# Patient Record
Sex: Male | Born: 1935
Health system: Southern US, Community
[De-identification: ages and names within clinical notes are randomized; demographics above are authoritative.]

## PROBLEM LIST (undated history)

## (undated) ENCOUNTER — Inpatient Hospital Stay: Admission: EM | Payer: Self-pay | Source: Home / Self Care

## (undated) DIAGNOSIS — L309 Dermatitis, unspecified: Secondary | ICD-10-CM

## (undated) DIAGNOSIS — I714 Abdominal aortic aneurysm, without rupture, unspecified: Secondary | ICD-10-CM

## (undated) DIAGNOSIS — D509 Iron deficiency anemia, unspecified: Secondary | ICD-10-CM

## (undated) DIAGNOSIS — I350 Nonrheumatic aortic (valve) stenosis: Secondary | ICD-10-CM

## (undated) DIAGNOSIS — K219 Gastro-esophageal reflux disease without esophagitis: Secondary | ICD-10-CM

## (undated) DIAGNOSIS — R55 Syncope and collapse: Secondary | ICD-10-CM

## (undated) DIAGNOSIS — I4891 Unspecified atrial fibrillation: Secondary | ICD-10-CM

## (undated) DIAGNOSIS — I739 Peripheral vascular disease, unspecified: Secondary | ICD-10-CM

## (undated) DIAGNOSIS — J8410 Pulmonary fibrosis, unspecified: Secondary | ICD-10-CM

## (undated) DIAGNOSIS — Z8719 Personal history of other diseases of the digestive system: Secondary | ICD-10-CM

## (undated) DIAGNOSIS — R011 Cardiac murmur, unspecified: Secondary | ICD-10-CM

## (undated) DIAGNOSIS — M1990 Unspecified osteoarthritis, unspecified site: Secondary | ICD-10-CM

## (undated) DIAGNOSIS — K909 Intestinal malabsorption, unspecified: Secondary | ICD-10-CM

## (undated) DIAGNOSIS — J849 Interstitial pulmonary disease, unspecified: Secondary | ICD-10-CM

## (undated) DIAGNOSIS — D631 Anemia in chronic kidney disease: Secondary | ICD-10-CM

## (undated) DIAGNOSIS — Z9981 Dependence on supplemental oxygen: Secondary | ICD-10-CM

## (undated) DIAGNOSIS — E785 Hyperlipidemia, unspecified: Secondary | ICD-10-CM

## (undated) HISTORY — PX: CARDIAC VALVE REPLACEMENT: SHX585

## (undated) HISTORY — DX: Iron deficiency anemia, unspecified: D50.9

## (undated) HISTORY — DX: Nonrheumatic aortic (valve) stenosis: I35.0

## (undated) HISTORY — DX: Dermatitis, unspecified: L30.9

## (undated) HISTORY — DX: Intestinal malabsorption, unspecified: K90.9

## (undated) HISTORY — DX: Hyperlipidemia, unspecified: E78.5

## (undated) HISTORY — DX: Interstitial pulmonary disease, unspecified: J84.9

## (undated) HISTORY — DX: Anemia in chronic kidney disease: D63.1

## (undated) HISTORY — PX: INGUINAL HERNIA REPAIR: SUR1180

## (undated) HISTORY — DX: Abdominal aortic aneurysm, without rupture: I71.4

## (undated) HISTORY — DX: Abdominal aortic aneurysm, without rupture, unspecified: I71.40

## (undated) HISTORY — PX: CLEFT PALATE REPAIR: SUR1165

---

## 1987-01-26 HISTORY — PX: OTHER SURGICAL HISTORY: SHX169

## 1997-01-25 HISTORY — PX: LAPAROSCOPIC CHOLECYSTECTOMY: SUR755

## 1998-01-13 ENCOUNTER — Encounter: Payer: Self-pay | Admitting: Emergency Medicine

## 1998-01-14 ENCOUNTER — Inpatient Hospital Stay (HOSPITAL_COMMUNITY): Admission: EM | Admit: 1998-01-14 | Discharge: 1998-01-17 | Payer: Self-pay | Admitting: Emergency Medicine

## 1998-01-14 ENCOUNTER — Encounter: Payer: Self-pay | Admitting: Family Medicine

## 1998-01-16 ENCOUNTER — Encounter: Payer: Self-pay | Admitting: Otolaryngology

## 2000-06-29 ENCOUNTER — Encounter: Payer: Self-pay | Admitting: Family Medicine

## 2000-06-29 ENCOUNTER — Ambulatory Visit (HOSPITAL_COMMUNITY): Admission: RE | Admit: 2000-06-29 | Discharge: 2000-06-29 | Payer: Self-pay | Admitting: Family Medicine

## 2000-09-29 ENCOUNTER — Encounter (INDEPENDENT_AMBULATORY_CARE_PROVIDER_SITE_OTHER): Payer: Self-pay | Admitting: Specialist

## 2000-09-29 ENCOUNTER — Ambulatory Visit (HOSPITAL_COMMUNITY): Admission: RE | Admit: 2000-09-29 | Discharge: 2000-09-29 | Payer: Self-pay | Admitting: *Deleted

## 2002-01-25 HISTORY — PX: CATARACT EXTRACTION W/ INTRAOCULAR LENS  IMPLANT, BILATERAL: SHX1307

## 2002-09-13 ENCOUNTER — Encounter (INDEPENDENT_AMBULATORY_CARE_PROVIDER_SITE_OTHER): Payer: Self-pay | Admitting: *Deleted

## 2002-09-13 ENCOUNTER — Ambulatory Visit (HOSPITAL_COMMUNITY): Admission: RE | Admit: 2002-09-13 | Discharge: 2002-09-13 | Payer: Self-pay | Admitting: *Deleted

## 2005-12-20 ENCOUNTER — Ambulatory Visit: Payer: Self-pay | Admitting: Family Medicine

## 2005-12-20 DIAGNOSIS — R011 Cardiac murmur, unspecified: Secondary | ICD-10-CM

## 2005-12-29 ENCOUNTER — Ambulatory Visit: Payer: Self-pay | Admitting: Cardiology

## 2005-12-29 ENCOUNTER — Encounter: Payer: Self-pay | Admitting: Cardiology

## 2005-12-29 ENCOUNTER — Ambulatory Visit: Admission: RE | Admit: 2005-12-29 | Discharge: 2005-12-29 | Payer: Self-pay | Admitting: Family Medicine

## 2005-12-30 ENCOUNTER — Telehealth: Payer: Self-pay | Admitting: Family Medicine

## 2006-01-03 ENCOUNTER — Encounter: Payer: Self-pay | Admitting: Family Medicine

## 2006-01-04 LAB — CONVERTED CEMR LAB
ALT: 16 units/L (ref 0–53)
CO2: 25 meq/L (ref 19–32)
Calcium: 9.2 mg/dL (ref 8.4–10.5)
Chloride: 102 meq/L (ref 96–112)
Cholesterol: 222 mg/dL — ABNORMAL HIGH (ref 0–200)
Creatinine, Ser: 0.9 mg/dL (ref 0.40–1.50)
Glucose, Bld: 87 mg/dL (ref 70–99)
Sodium: 139 meq/L (ref 135–145)
Total Bilirubin: 0.7 mg/dL (ref 0.3–1.2)
Total Protein: 7.1 g/dL (ref 6.0–8.3)
Triglycerides: 153 mg/dL — ABNORMAL HIGH (ref <150)
VLDL: 31 mg/dL (ref 0–40)

## 2007-01-20 ENCOUNTER — Ambulatory Visit: Payer: Self-pay | Admitting: Family Medicine

## 2007-03-08 ENCOUNTER — Emergency Department (HOSPITAL_COMMUNITY): Admission: EM | Admit: 2007-03-08 | Discharge: 2007-03-08 | Payer: Self-pay | Admitting: Emergency Medicine

## 2007-03-20 ENCOUNTER — Ambulatory Visit: Payer: Self-pay | Admitting: Family Medicine

## 2007-08-07 ENCOUNTER — Ambulatory Visit: Payer: Self-pay | Admitting: Family Medicine

## 2007-08-07 DIAGNOSIS — I359 Nonrheumatic aortic valve disorder, unspecified: Secondary | ICD-10-CM

## 2007-08-08 ENCOUNTER — Encounter: Payer: Self-pay | Admitting: Family Medicine

## 2007-08-08 DIAGNOSIS — E785 Hyperlipidemia, unspecified: Secondary | ICD-10-CM

## 2007-08-09 LAB — CONVERTED CEMR LAB
AST: 35 units/L (ref 0–37)
Albumin: 4.2 g/dL (ref 3.5–5.2)
Alkaline Phosphatase: 63 units/L (ref 39–117)
BUN: 14 mg/dL (ref 6–23)
Creatinine, Ser: 0.86 mg/dL (ref 0.40–1.50)
Glucose, Bld: 105 mg/dL — ABNORMAL HIGH (ref 70–99)
HCT: 44.8 % (ref 39.0–52.0)
HDL: 51 mg/dL (ref >39)
Hemoglobin: 14.8 g/dL (ref 13.0–17.0)
LDL Cholesterol: 161 mg/dL — ABNORMAL HIGH (ref 0–99)
MCHC: 33 g/dL (ref 30.0–36.0)
MCV: 87.8 fL (ref 78.0–100.0)
Potassium: 5.1 meq/L (ref 3.5–5.3)
RBC: 5.1 M/uL (ref 4.22–5.81)
RDW: 14.7 % (ref 11.5–15.5)
Total Bilirubin: 0.8 mg/dL (ref 0.3–1.2)
Total CHOL/HDL Ratio: 4.9
Triglycerides: 199 mg/dL — ABNORMAL HIGH (ref <150)
VLDL: 40 mg/dL (ref 0–40)

## 2007-08-26 HISTORY — PX: ROTATOR CUFF REPAIR: SHX139

## 2007-09-22 ENCOUNTER — Ambulatory Visit (HOSPITAL_BASED_OUTPATIENT_CLINIC_OR_DEPARTMENT_OTHER): Admission: RE | Admit: 2007-09-22 | Discharge: 2007-09-23 | Payer: Self-pay | Admitting: Specialist

## 2008-06-11 ENCOUNTER — Ambulatory Visit: Payer: Self-pay | Admitting: Family Medicine

## 2008-06-11 LAB — CONVERTED CEMR LAB: Cholesterol, target level: 200 mg/dL

## 2008-06-12 ENCOUNTER — Encounter: Payer: Self-pay | Admitting: Family Medicine

## 2008-06-13 LAB — CONVERTED CEMR LAB
Alkaline Phosphatase: 61 units/L (ref 39–117)
BUN: 17 mg/dL (ref 6–23)
CO2: 23 meq/L (ref 19–32)
Cholesterol: 183 mg/dL (ref 0–200)
Glucose, Bld: 103 mg/dL — ABNORMAL HIGH (ref 70–99)
HDL: 47 mg/dL (ref >39)
Sodium: 140 meq/L (ref 135–145)
Total Bilirubin: 0.7 mg/dL (ref 0.3–1.2)
Total Protein: 7.3 g/dL (ref 6.0–8.3)
Triglycerides: 152 mg/dL — ABNORMAL HIGH (ref <150)
VLDL: 30 mg/dL (ref 0–40)

## 2008-06-18 ENCOUNTER — Telehealth: Payer: Self-pay | Admitting: Family Medicine

## 2008-06-28 ENCOUNTER — Ambulatory Visit: Payer: Self-pay

## 2008-06-28 ENCOUNTER — Encounter: Payer: Self-pay | Admitting: Family Medicine

## 2009-01-14 ENCOUNTER — Ambulatory Visit: Payer: Self-pay | Admitting: Family Medicine

## 2009-01-14 DIAGNOSIS — R7309 Other abnormal glucose: Secondary | ICD-10-CM

## 2009-01-15 ENCOUNTER — Encounter: Payer: Self-pay | Admitting: Family Medicine

## 2009-01-16 LAB — CONVERTED CEMR LAB
AST: 23 units/L (ref 0–37)
Alkaline Phosphatase: 58 units/L (ref 39–117)
BUN: 10 mg/dL (ref 6–23)
Glucose, Bld: 101 mg/dL — ABNORMAL HIGH (ref 70–99)
HDL: 50 mg/dL (ref >39)
Hemoglobin: 13.1 g/dL (ref 13.0–17.0)
LDL Cholesterol: 82 mg/dL (ref 0–99)
MCHC: 31.6 g/dL (ref 30.0–36.0)
MCV: 88.7 fL (ref 78.0–100.0)
RBC: 4.67 M/uL (ref 4.22–5.81)
RDW: 15 % (ref 11.5–15.5)
Sodium: 141 meq/L (ref 135–145)
TSH: 2.857 microintl units/mL (ref 0.350–4.500)
Total Bilirubin: 0.5 mg/dL (ref 0.3–1.2)
Total CHOL/HDL Ratio: 3
Triglycerides: 102 mg/dL (ref <150)
VLDL: 20 mg/dL (ref 0–40)

## 2009-07-16 ENCOUNTER — Telehealth: Payer: Self-pay | Admitting: Family Medicine

## 2009-07-16 ENCOUNTER — Telehealth (INDEPENDENT_AMBULATORY_CARE_PROVIDER_SITE_OTHER): Payer: Self-pay | Admitting: *Deleted

## 2009-07-16 ENCOUNTER — Ambulatory Visit: Payer: Self-pay | Admitting: Emergency Medicine

## 2009-07-16 DIAGNOSIS — L255 Unspecified contact dermatitis due to plants, except food: Secondary | ICD-10-CM | POA: Insufficient documentation

## 2009-08-20 ENCOUNTER — Encounter: Payer: Self-pay | Admitting: Cardiology

## 2009-08-20 ENCOUNTER — Ambulatory Visit: Payer: Self-pay | Admitting: Cardiology

## 2009-09-08 ENCOUNTER — Telehealth (INDEPENDENT_AMBULATORY_CARE_PROVIDER_SITE_OTHER): Payer: Self-pay | Admitting: Radiology

## 2009-09-09 ENCOUNTER — Encounter: Payer: Self-pay | Admitting: Internal Medicine

## 2009-09-09 ENCOUNTER — Ambulatory Visit: Payer: Self-pay

## 2009-09-09 ENCOUNTER — Ambulatory Visit (HOSPITAL_COMMUNITY): Admission: RE | Admit: 2009-09-09 | Discharge: 2009-09-09 | Payer: Self-pay | Admitting: Cardiology

## 2009-09-09 ENCOUNTER — Encounter: Payer: Self-pay | Admitting: Cardiology

## 2009-09-09 ENCOUNTER — Ambulatory Visit: Payer: Self-pay | Admitting: Internal Medicine

## 2009-09-09 ENCOUNTER — Encounter (HOSPITAL_COMMUNITY): Admission: RE | Admit: 2009-09-09 | Discharge: 2009-10-14 | Payer: Self-pay | Admitting: Cardiology

## 2009-09-25 HISTORY — PX: CARDIAC CATHETERIZATION: SHX172

## 2009-10-08 ENCOUNTER — Encounter: Payer: Self-pay | Admitting: Cardiology

## 2009-10-08 ENCOUNTER — Encounter (INDEPENDENT_AMBULATORY_CARE_PROVIDER_SITE_OTHER): Payer: Self-pay | Admitting: *Deleted

## 2009-10-08 ENCOUNTER — Encounter: Admission: RE | Admit: 2009-10-08 | Discharge: 2009-10-08 | Payer: Self-pay | Admitting: Cardiology

## 2009-10-08 ENCOUNTER — Ambulatory Visit: Payer: Self-pay | Admitting: Cardiology

## 2009-10-10 ENCOUNTER — Inpatient Hospital Stay (HOSPITAL_BASED_OUTPATIENT_CLINIC_OR_DEPARTMENT_OTHER): Admission: RE | Admit: 2009-10-10 | Discharge: 2009-10-10 | Payer: Self-pay | Admitting: Cardiology

## 2009-10-10 ENCOUNTER — Ambulatory Visit: Payer: Self-pay | Admitting: Cardiovascular Disease

## 2009-10-10 ENCOUNTER — Encounter: Payer: Self-pay | Admitting: Cardiology

## 2009-10-13 ENCOUNTER — Encounter: Payer: Self-pay | Admitting: Thoracic Surgery (Cardiothoracic Vascular Surgery)

## 2009-10-13 ENCOUNTER — Ambulatory Visit: Payer: Self-pay | Admitting: Thoracic Surgery (Cardiothoracic Vascular Surgery)

## 2009-10-13 ENCOUNTER — Encounter: Payer: Self-pay | Admitting: Family Medicine

## 2009-10-15 ENCOUNTER — Inpatient Hospital Stay (HOSPITAL_COMMUNITY)
Admission: RE | Admit: 2009-10-15 | Discharge: 2009-10-20 | Payer: Self-pay | Admitting: Thoracic Surgery (Cardiothoracic Vascular Surgery)

## 2009-10-15 ENCOUNTER — Ambulatory Visit: Payer: Self-pay | Admitting: Thoracic Surgery (Cardiothoracic Vascular Surgery)

## 2009-10-15 ENCOUNTER — Encounter: Payer: Self-pay | Admitting: Thoracic Surgery (Cardiothoracic Vascular Surgery)

## 2009-10-15 HISTORY — PX: TISSUE AORTIC VALVE REPLACEMENT: SHX2527

## 2009-10-22 ENCOUNTER — Encounter: Payer: Self-pay | Admitting: Internal Medicine

## 2009-10-23 ENCOUNTER — Encounter: Payer: Self-pay | Admitting: Cardiovascular Disease

## 2009-10-29 ENCOUNTER — Encounter: Payer: Self-pay | Admitting: Cardiovascular Disease

## 2009-10-31 ENCOUNTER — Encounter: Payer: Self-pay | Admitting: Cardiology

## 2009-11-03 ENCOUNTER — Encounter: Payer: Self-pay | Admitting: Cardiology

## 2009-11-05 ENCOUNTER — Ambulatory Visit: Payer: Self-pay | Admitting: Cardiology

## 2009-11-05 DIAGNOSIS — Z952 Presence of prosthetic heart valve: Secondary | ICD-10-CM

## 2009-11-05 DIAGNOSIS — I4819 Other persistent atrial fibrillation: Secondary | ICD-10-CM

## 2009-11-05 LAB — CONVERTED CEMR LAB: POC INR: 2.7

## 2009-11-10 ENCOUNTER — Ambulatory Visit: Payer: Self-pay | Admitting: Thoracic Surgery (Cardiothoracic Vascular Surgery)

## 2009-11-10 ENCOUNTER — Encounter
Admission: RE | Admit: 2009-11-10 | Discharge: 2009-11-10 | Payer: Self-pay | Admitting: Thoracic Surgery (Cardiothoracic Vascular Surgery)

## 2009-11-10 ENCOUNTER — Encounter: Payer: Self-pay | Admitting: Family Medicine

## 2009-11-12 ENCOUNTER — Ambulatory Visit: Payer: Self-pay | Admitting: Internal Medicine

## 2009-11-12 LAB — CONVERTED CEMR LAB: POC INR: 3.9

## 2009-11-13 ENCOUNTER — Encounter (HOSPITAL_COMMUNITY)
Admission: RE | Admit: 2009-11-13 | Discharge: 2010-02-11 | Payer: Self-pay | Source: Home / Self Care | Attending: Cardiology | Admitting: Cardiology

## 2009-11-17 ENCOUNTER — Ambulatory Visit: Payer: Self-pay | Admitting: Internal Medicine

## 2009-11-17 ENCOUNTER — Ambulatory Visit (HOSPITAL_COMMUNITY): Admission: RE | Admit: 2009-11-17 | Discharge: 2009-11-17 | Payer: Self-pay | Admitting: Cardiology

## 2009-11-17 ENCOUNTER — Encounter: Payer: Self-pay | Admitting: Cardiology

## 2009-11-17 ENCOUNTER — Ambulatory Visit: Payer: Self-pay

## 2009-11-19 ENCOUNTER — Encounter: Payer: Self-pay | Admitting: Cardiology

## 2009-11-21 ENCOUNTER — Ambulatory Visit: Payer: Self-pay | Admitting: Cardiovascular Disease

## 2009-11-21 LAB — CONVERTED CEMR LAB: POC INR: 2.7

## 2009-12-08 ENCOUNTER — Ambulatory Visit: Payer: Self-pay | Admitting: Internal Medicine

## 2009-12-08 LAB — CONVERTED CEMR LAB: POC INR: 2.5

## 2009-12-11 ENCOUNTER — Encounter: Payer: Self-pay | Admitting: Cardiology

## 2009-12-17 ENCOUNTER — Telehealth: Payer: Self-pay | Admitting: Cardiology

## 2009-12-23 ENCOUNTER — Encounter: Payer: Self-pay | Admitting: Cardiology

## 2009-12-31 ENCOUNTER — Encounter: Payer: Self-pay | Admitting: Cardiology

## 2009-12-31 ENCOUNTER — Ambulatory Visit: Payer: Self-pay | Admitting: Cardiology

## 2010-01-02 ENCOUNTER — Encounter: Payer: Self-pay | Admitting: Cardiology

## 2010-01-05 ENCOUNTER — Encounter (INDEPENDENT_AMBULATORY_CARE_PROVIDER_SITE_OTHER): Payer: Self-pay | Admitting: *Deleted

## 2010-01-22 ENCOUNTER — Encounter: Payer: Self-pay | Admitting: Family Medicine

## 2010-01-22 ENCOUNTER — Ambulatory Visit
Admission: RE | Admit: 2010-01-22 | Discharge: 2010-01-22 | Payer: Self-pay | Source: Home / Self Care | Attending: Family Medicine | Admitting: Family Medicine

## 2010-01-22 DIAGNOSIS — I809 Phlebitis and thrombophlebitis of unspecified site: Secondary | ICD-10-CM

## 2010-01-22 DIAGNOSIS — N529 Male erectile dysfunction, unspecified: Secondary | ICD-10-CM

## 2010-01-22 LAB — CONVERTED CEMR LAB: Blood Glucose, AC Bkfst: 111 mg/dL

## 2010-01-23 LAB — CONVERTED CEMR LAB
ALT: 47 units/L (ref 0–53)
AST: 60 units/L — ABNORMAL HIGH (ref 0–37)
Alkaline Phosphatase: 61 units/L (ref 39–117)
CO2: 26 meq/L (ref 19–32)
Creatinine, Ser: 1.23 mg/dL (ref 0.40–1.50)
TSH: 4.071 microintl units/mL (ref 0.350–4.500)
Total Bilirubin: 0.5 mg/dL (ref 0.3–1.2)

## 2010-02-09 ENCOUNTER — Encounter: Payer: Self-pay | Admitting: Family Medicine

## 2010-02-09 ENCOUNTER — Ambulatory Visit
Admission: RE | Admit: 2010-02-09 | Discharge: 2010-02-09 | Payer: Self-pay | Source: Home / Self Care | Attending: Thoracic Surgery (Cardiothoracic Vascular Surgery) | Admitting: Thoracic Surgery (Cardiothoracic Vascular Surgery)

## 2010-02-12 ENCOUNTER — Encounter (HOSPITAL_COMMUNITY)
Admission: RE | Admit: 2010-02-12 | Discharge: 2010-02-24 | Payer: Self-pay | Source: Home / Self Care | Attending: Cardiology | Admitting: Cardiology

## 2010-02-24 NOTE — Medication Information (Signed)
Summary: new coumadin eval  Anticoagulant Therapy  Managed by: Weston Brass, PharmD Referring MD: B. Crenshaw PCP: [name] MD Supervising MD: Daleen Squibb MD, Maisie Fus Indication 1: Atrial Fibrillation Lab Used: [facility] [place] Site: [address] INR POC 2.7 INR RANGE 2.0-3.0  Dietary changes: no    Health status changes: no    Bleeding/hemorrhagic complications: no    Recent/future hospitalizations: no    Any changes in medication regimen? yes       Details: Amiodarone dose decreased to one tablet everyday.   Recent/future dental: no  Any missed doses?: no       Is patient compliant with meds? yes       Allergies: No Known Drug Allergies  Anticoagulation Management History:      The patient is taking warfarin and comes in today for a routine follow up visit.  Positive risk factors for bleeding include an age of 10 years or older.  Negative risk factors for bleeding include no history of CVA/TIA.  The bleeding index is 'intermediate risk'.  Negative CHADS2 values include History of HTN, Age > 81 years old, History of Diabetes, and Prior Stroke/CVA/TIA.  Anticoagulation responsible provider: Daleen Squibb MD, Maisie Fus.  INR POC: 2.7.  Cuvette Lot#: [id number].  Exp: 11/2010.    Anticoagulation Management Assessment/Plan:      The patient's current anticoagulation dose is Warfarin sodium 5 mg tabs: Use as directed by Anticoagulation Clinic.  The target INR is 2.0-3.0.  The next INR is due 11/05/2009.  Anticoagulation instructions were given to home health nurse.  Results were reviewed/authorized by Weston Brass, PharmD.  He was notified by Ilean Skill D candidate.         Prior Anticoagulation Instructions: INR 3.2  Spoke with patient.  Skip tomorrow's dose of Coumadin then decrease dose to 1 tablet every day except 1/2 tablet on Monday and Friday.  Recheck INR in 1 week. Orders given to Al with [facility].   Current Anticoagulation Instructions: INR 2.7  Continue  taking 1 tablet everyday except 1/2 tablet on Monday and Friday. Recheck in 1 week.

## 2010-02-24 NOTE — Miscellaneous (Signed)
Summary: [facility] Physician Order/Treatment Plan   [facility] Physician Order/Treatment Plan   Imported By: Roderic Ovens 11/12/2009 15:46:47  _____________________________________________________________________  External Attachment:    Type:   Image     Comment:   External Document

## 2010-02-24 NOTE — Assessment & Plan Note (Signed)
Summary: [facility]   Visit Type:  Initial Consult Primary Provider:  [name] MD  CC:  Aortic valve disorder-.  History of Present Illness: 76 year old male for evaluation of aortic stenosis. Last echocardiogram was performed in June of 2010 revealed normal LV function, mild left ventricular hypertrophy and grade 1 diastolic dysfunction. There was moderate aortic stenosis with peak and mean gradients through the valve of 56 and 35 mm Hg. Because of his aortic stenosis we were asked to further evaluate. The patient does state that he has increased dyspnea on exertion over the past 6 months relieved with rest. There is no orthopnea, PND, pedal edema, palpitations, syncope or chest pain.  Current Medications (verified): 1)  Protonix 40 Mg  Tbec (Pantoprazole Sodium) .... Take 1 Tablet By Mouth Once A Day 2)  Pravastatin Sodium 40 Mg  Tabs (Pravastatin Sodium) .... Take 1 Tablet By Mouth Once A Day At Bedtime 3)  Aspirin 325 Mg Tabs (Aspirin) .... Take 1 Tablet By Mouth Once A Day  Allergies (verified): No Known Drug Allergies  Past History:  Past Medical History: HYPERTENSION  HYPERLIPIDEMIA Aortic stenosis POISON IVY DERMATITIS  Cleft palate  Past Surgical History: Bilat cataract sx 2004 cholecystectomy 1999 7 surgeries for cleft palate 1938 Oral surgery with bone graft from hip for cleft palate 1989 Right groin hernia repair 1950s Rotator cuff tear, left shoulder.   Family History: Reviewed history from 12/20/2005 and no changes required. Brother with heart murmur,HTN chol, another brother with MI Father with lung and stomach cancer sister with bone Ca, DM Mother and Father with MI  Social History: Reviewed history from 07/16/2009 and no changes required. 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  non-smoker. Alcohol use-no Drug use-no  Review of Systems       Some arthralgias but no fevers or chills,  productive cough, hemoptysis, dysphasia, odynophagia, melena, hematochezia, dysuria, hematuria, rash, seizure activity, orthopnea, PND, pedal edema, claudication. Remaining systems are negative.   Vital Signs:  Patient profile:   76 year old male Height:      72 inches Weight:      256.04 pounds BMI:     34.85 Pulse rate:   73 / minute Pulse rhythm:   regular Resp:     18 per minute BP sitting:   127 / 83  (left arm) Cuff size:   large  Vitals Entered By: Vikki Ports (August 20, 2009 2:44 PM)  Physical Exam  General:  Well developed/well nourished in NAD Skin warm/dry Patient not depressed No peripheral clubbing Back-normal HEENT-Previous cleft palate surgery. Eyelids normal. Neck supple/normal carotid upstroke bilaterally; no bruits; no JVD; no thyromegaly chest - CTA/ normal expansion CV - RRR/normal S1 and S2; no rubs or gallops;  PMI nondisplaced; 3/6 systolic murmur left sternal border. S2 is diminished. Murmur radiates to carotids. Abdomen -NT/ND, no HSM, no mass, + bowel sounds, no bruit 2+ femoral pulses, no bruits Ext-no edema, chords, 2+ DP Neuro-grossly nonfocal     EKG  Procedure date:  08/20/2009  Findings:      Sinus rhythm at a rate of 73. Axis normal. No ST changes.  Impression & Recommendations:  Problem # 1:  AORTIC VALVE DISORDERS (ICD-424.1) Patient has significant aortic stenosis on examination. He also complains of dyspnea on exertion. I will schedule an echocardiogram to reassess severity of aortic stenosis. I will also schedule a Myoview to exclude ischemia. I have explained that he will most likely  need aortic valve replacement in the future. Orders: EKG w/ Interpretation (93000)  Problem # 2:  HYPERLIPIDEMIA (ICD-272.4) Continue statin. Lipids and liver monitored by primary care. His updated medication list for this problem includes:    Pravastatin Sodium 40 Mg Tabs (Pravastatin sodium) .Marland Kitchen... Take 1 tablet by mouth once a day at  bedtime  Problem # 3:  ELEVATED BLOOD PRESSURE WITHOUT DIAGNOSIS OF HYPERTENSION (ICD-796.2) Blood pressure controlled at present.  Patient Instructions: 1)  Your physician recommends that you schedule a follow-up appointment in: OCTOBER 5,2011 AT 2:00PM HERE AT [place] OFFICE 2)  Your physician recommends that you continue on your current medications as directed. Please refer to the Current Medication list given to you today. 3)  Your physician has requested that you have an echocardiogram.  Echocardiography is a painless test that uses sound waves to create images of your heart. It provides your doctor with information about the size and shape of your heart and how well your heart's chambers and valves are working.  This procedure takes approximately one hour. There are no restrictions for this procedure. AUGUST 16.2011 AT  8:30 AM 4)  Your physician has requested that you have an exercise stress myoview.  For further information please visit [email or web address]  Please follow instruction sheet, as given.AUGUST 16,2011 AT 9:45 AM

## 2010-02-24 NOTE — Assessment & Plan Note (Signed)
Summary: POISON IVY/OAK/KH   Vital Signs:  Patient Profile:   76 Years Old Male CC:      ?poison ivy X 4days  Height:     72 inches Weight:      264 pounds O2 Sat:      98 % O2 treatment:    Room Air Temp:     97.5 degrees F oral Pulse rate:   71 / minute Resp:     14 per minute BP sitting:   144 / 83  (right arm) Cuff size:   large  Pt. in pain?   no  Vitals Entered By: Lajean Saver RN (July 16, 2009 10:35 AM)                   Updated Prior Medication List: PROTONIX 40 MG  TBEC (PANTOPRAZOLE SODIUM) Take 1 tablet by mouth once a day PRAVASTATIN SODIUM 40 MG  TABS (PRAVASTATIN SODIUM) Take 1 tablet by mouth once a day at bedtime ASPIRIN 325 MG TABS (ASPIRIN) Take 1 tablet by mouth once a day WAL-ZYR 0.025 % SOLN (KETOTIFEN FUMARATE) 1gtt per eye BID BENADRYL 25 MG TABS (DIPHENHYDRAMINE HCL)   Current Allergies (reviewed today): No known allergies History of Present Illness Chief Complaint: ?poison ivy X 4days  History of Present Illness: Mowing grass and got into some poison ivy about 4 days.  Began itching and having a rash on his arms.  It has spread to his hands and then he rubbed his eyes and got it on the skin underneath his right eye.  He has been using allergy eyedrops and Calahist pills, both of which are helping.  But he is still itchy and asking for additional help.  No other symptoms.  REVIEW OF SYSTEMS Constitutional Symptoms      Denies fever, chills, night sweats, weight loss, weight gain, and fatigue.  Eyes       Complains of glasses.      Denies change in vision, eye pain, eye discharge, contact lenses, and eye surgery.      Comments: itchy eyes, swelling Ear/Nose/Throat/Mouth       Denies hearing loss/aids, change in hearing, ear pain, ear discharge, dizziness, frequent runny nose, frequent nose bleeds, sinus problems, sore throat, hoarseness, and tooth pain or bleeding.  Respiratory       Denies dry cough, productive cough, wheezing, shortness of  breath, asthma, bronchitis, and emphysema/COPD.  Cardiovascular       Denies murmurs, chest pain, and tires easily with exhertion.    Gastrointestinal       Denies stomach pain, nausea/vomiting, diarrhea, constipation, blood in bowel movements, and indigestion. Genitourniary       Denies painful urination, kidney stones, and loss of urinary control. Neurological       Denies paralysis, seizures, and fainting/blackouts. Musculoskeletal       Denies muscle pain, joint pain, joint stiffness, decreased range of motion, redness, swelling, muscle weakness, and gout.  Skin       Denies bruising, unusual mles/lumps or sores, and hair/skin or nail changes.      Comments: bilateral hands  Psych       Denies mood changes, temper/anger issues, anxiety/stress, speech problems, depression, and sleep problems.  Past History:  Past Medical History: Cleft palate Heart murmur.  Hyperlipidemia  Past Surgical History: Reviewed history from 08/07/2007 and no changes required. Bilat cataract sx 2004 cholecystectomy 1999 7 surgeries for cleft palate 1938 Oral surgery with bone graft from hip for cleft palate  1989 Right groin hernia repair 1950s  Family History: Reviewed history from 12/20/2005 and no changes required. Brother with heart murmur,HTN chol, another brother with MI Father with lung and stomach cancer sister with bone Ca, DM Mother and Father with MI  Social History: REtired Retail banker.  Married to [name] with 2 kids and 3 stepchildren.  non-smoker. Alcohol use-no Drug use-no Physical Exam General appearance: well developed, well nourished, no acute distress Head: normocephalic, atraumatic Eyes: conjunctivae and lids normal.  Erythema, slight swelling inferior to right eye c/w contact derm Ears: normal, no lesions or deformities Nasal: mucosa pink, nonedematous, no septal deviation, turbinates normal Oral/Pharynx: tongue normal, posterior pharynx without  erythema or exudate Chest/Lungs: no rales, wheezes, or rhonchi bilateral, breath sounds equal without effort Heart: regular rate and  rhythm, no murmur Skin: Scattered erythema/raised lesions c/w poison ivy on right hand, right arm Assessment New Problems: POISON IVY DERMATITIS (ICD-692.6)   Plan New Medications/Changes: MEDROL (PAK) 4 MG TABS (METHYLPREDNISOLONE) Use dose pak as directed  #1 x 0, 07/16/2009, Hoyt Koch MD  New Orders: New Patient Level III [phone] Solumedrol up to 125mg  [J2930] Admin of Therapeutic Inj  intramuscular or subcutaneous [96372]  The patient and/or caregiver has been counseled thoroughly with regard to medications prescribed including dosage, schedule, interactions, rationale for use, and possible side effects and they verbalize understanding.  Diagnoses and expected course of recovery discussed and will return if not improved as expected or if the condition worsens. Patient and/or caregiver verbalized understanding.  Prescriptions: MEDROL (PAK) 4 MG TABS (METHYLPREDNISOLONE) Use dose pak as directed  #1 x 0   Entered and Authorized by:   Hoyt Koch MD   Signed by:   Hoyt Koch MD on 07/16/2009   Method used:   Printed then faxed to ...       [facility]* (retail)       [address] [place], [place]  [address]       Ph: [phone]       Fax: [phone]   RxID:   [id number]   Patient Instructions: 1)  Continue using your antihistamine eyedrops and your topical antihistamine (and oral Benedryl) as previously using 2)  Cool compresses, especially around eye 3)  Follow-up with your primary care physician if not getting better  Medication Administration  Injection # 1:    Medication: Solumedrol up to 125mg     Diagnosis: POISON IVY DERMATITIS (ICD-692.6)    Route: IM    Site: LUOQ gluteus    Exp Date: 04/24/2012    Lot #: [id number]    Mfr: pfizer    Patient tolerated injection without complications    Given by:  Lajean Saver RN (July 16, 2009 11:07 AM)  Orders Added: 1)  New Patient Level III [99203] 2)  Solumedrol up to 125mg  [J2930] 3)  Admin of Therapeutic Inj  intramuscular or subcutaneous [73532]

## 2010-02-24 NOTE — Progress Notes (Signed)
Summary: tooth extraction - coumadin  Phone Note From Other Clinic   Caller: judy office [phone] fax [phone] Request: Talk with Nurse Summary of Call: pt need tooth extraction - pt on coumadin pls advise. Initial call taken by: Lorne Skeens,  December 17, 2009 10:51 AM  Follow-up for Phone Call        left message for judy will discuss with dr Jens Som and let them know Deliah Goody, RN  December 17, 2009 11:31 AM   Additional Follow-up for Phone Call Additional follow up Details #1::        dc coumadin Ferman Hamming, MD, [facility]  December 18, 2009 11:43 AM  pt and judy made aware Deliah Goody, RN  December 23, 2009 10:12 AM

## 2010-02-24 NOTE — Letter (Signed)
Summary: Cardiac Catheterization Instructions- [facility]  [facility] [address], [address]   [place], [place] [address]   Phone: [phone]  Fax:      10/08/2009 MRN: [id number]  [facility] [address] [place], [place]  [address]  Dear Mr. Swader,   You are scheduled for a Cardiac Catheterization on FRIDAY 10-10-09 with Dr. Shirlee Latch  Please arrive to the 1st floor of the [facility] at [facility] at      7:30 am        on the day of your procedure. Please do not arrive before 6:30 a.m. Call the Heart and Vascular Center at [phone] if you are unable to make your appointmnet. The Code to get into the parking garage under the building is 0020. Take the elevators to the 1st floor. You must have someone to drive you home. Someone must be with you for the first 24 hours after you arrive home. Please wear clothes that are easy to get on and off and wear slip-on shoes. Do not eat or drink after midnight except water with your medications that morning. Bring all your medications and current insurance cards with you.  ___ DO NOT take these medications before your procedure: ________________________________________________________________  ___ Make sure you take your aspirin.  ___ You may take ALL of your medications with water that morning. ________________________________________________________________________________________________________________________________  ___ DO NOT take ANY medications before your procedure.  ___ Pre-med instructions:  ________________________________________________________________________________________________________________________________  The usual length of stay after your procedure is 2 to 3 hours. This can vary.  If you have any questions, please call the office at the number listed above.   Deliah Goody, RN

## 2010-02-24 NOTE — Progress Notes (Signed)
Summary: Nuc Pre-Procedure  Phone Note Outgoing Call Call back at [facility] Phone [phone]   Call placed by: Leonia Corona, RT-N,  September 08, 2009 2:52 PM Call placed to: Patient Reason for Call: Confirm/change Appt Summary of Call: Reviewed information on Myoview Information Sheet (see scanned document for further details).  Spoke with patient.     Nuclear Med Background Indications for Stress Test: Evaluation for Ischemia   History: Echo  History Comments: 6/10- Echo- Nl. EF; Mod. Aortic stenosis  Symptoms: DOE    Nuclear Pre-Procedure Cardiac Risk Factors: Family History - CAD, Hypertension, Lipids Height (in): 72

## 2010-02-24 NOTE — Assessment & Plan Note (Signed)
Summary: [facility]   Visit Type:  Follow-up Primary Provider:  [name] MD  CC:  No complaints.  History of Present Illness: 76 year old male I saw in July of 2011 for evaluation of aortic stenosis. Last echocardiogram was performed in August fo 2011 and  revealed normal LV function, mild left ventricular hypertrophy and grade 1 diastolic dysfunction. There was severe aortic stenosis with  mean gradient through the valve of 51 mm Hg. There was mild aortic insufficiency and mitral regurgitation and mild biatrial enlargement. Cardiac catheterization in Sept 2011 revealed nonobstructive coronary disease, normal LV function and moderately severe aortic stenosis with a mean gradient of 34 mm of mercury. The patient then underwent aortic valve replacement on September 21 of 2011 with a pericardial tissue valve. Postoperative course complicated by atrial fibrillation and treated with amiodarone. Last echocardiogram performed in October 2011 revealed normal LV function and a normally functioning aortic valve with a mean gradient of 14 mm of mercury. I last saw him in October of 2011. Since then, the patient denies any dyspnea on exertion, orthopnea, PND, pedal edema, palpitations, syncope or chest pain.   Current Medications (verified): 1)  Protonix 40 Mg  Tbec (Pantoprazole Sodium) .... Take 1 Tablet By Mouth Once A Day 2)  Pravastatin Sodium 40 Mg  Tabs (Pravastatin Sodium) .... Take 1 Tablet By Mouth Once A Day At Bedtime 3)  Aspirin 81 Mg Tbec (Aspirin) .... Take One Tablet By Mouth Daily 4)  Metoprolol Tartrate 25 Mg Tabs (Metoprolol Tartrate) .... Take One Tablet By Mouth Twice A Day 5)  Amoxicillin 500 Mg Caps (Amoxicillin) .... Take 4 Cap One Hour Prior Dentist Appointment  Allergies (verified): No Known Drug Allergies  Past History:  Past Medical History: Reviewed history from 08/20/2009 and no changes required. HYPERTENSION  HYPERLIPIDEMIA Aortic stenosis POISON IVY  DERMATITIS  Cleft palate  Past Surgical History: Reviewed history from 11/05/2009 and no changes required. Bilat cataract sx 2004 cholecystectomy 1999 7 surgeries for cleft palate 1938 Oral surgery with bone graft from hip for cleft palate 1989 Right groin hernia repair 1950s Rotator cuff tear, left shoulder.  aortic valve replacement with pericardial tissue valve on October 15, 2009.  Social History: Reviewed history from 08/20/2009 and no changes required. 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  non-smoker. Alcohol use-no Drug use-no  Review of Systems       no fevers or chills, productive cough, hemoptysis, dysphasia, odynophagia, melena, hematochezia, dysuria, hematuria, rash, seizure activity, orthopnea, PND, pedal edema, claudication. Remaining systems are negative.   Vital Signs:  Patient profile:   76 year old male Height:      72 inches Weight:      235.75 pounds BMI:     32.09 Pulse rate:   92 / minute Pulse rhythm:   irregular Resp:     18 per minute BP sitting:   135 / 79  (right arm) Cuff size:   large  Vitals Entered By: Vikki Ports (December 31, 2009 2:16 PM)  Physical Exam  General:  Well-developed well-nourished in no acute distress.  Skin is warm and dry.  HEENT is normal.  Neck is supple. No thyromegaly.  Chest is clear to auscultation with normal expansion.  Cardiovascular exam is regular rate and rhythm.  Abdominal exam nontender or distended. No masses palpated. Extremities show no edema. neuro grossly intact    EKG  Procedure date:  12/31/2009  Findings:  Sinus rhythm at a rate of 62. First degree AV block. Nonspecific ST changes.  Impression & Recommendations:  Problem # 1:  PAROXYSMAL ATRIAL FIBRILLATION (ICD-427.31) Patient did have postoperative atrial fibrillation but remains in sinus rhythm. His amiodarone and Coumadin have now been discontinued. The following  medications were removed from the medication list:    Amiodarone Hcl 200 Mg Tabs (Amiodarone hcl) .Marland Kitchen... 1 tab by mouth two times a day    Warfarin Sodium 5 Mg Tabs (Warfarin sodium) ..... Use as directed by anticoagulation clinic His updated medication list for this problem includes:    Aspirin 81 Mg Tbec (Aspirin) .Marland Kitchen... Take one tablet by mouth daily    Metoprolol Tartrate 25 Mg Tabs (Metoprolol tartrate) .Marland Kitchen... Take one tablet by mouth twice a day  The following medications were removed from the medication list:    Amiodarone Hcl 200 Mg Tabs (Amiodarone hcl) .Marland Kitchen... 1 tab by mouth two times a day    Warfarin Sodium 5 Mg Tabs (Warfarin sodium) ..... Use as directed by anticoagulation clinic His updated medication list for this problem includes:    Aspirin 81 Mg Tbec (Aspirin) .Marland Kitchen... Take one tablet by mouth daily    Metoprolol Tartrate 25 Mg Tabs (Metoprolol tartrate) .Marland Kitchen... Take one tablet by mouth twice a day  Problem # 2:  AORTIC VALVE REPLACEMENT, HX OF (ICD-V43.3) Continued SBE prophylaxis.  Problem # 3:  HYPERLIPIDEMIA (ICD-272.4)  Continue statin. Check lipids and liver. His updated medication list for this problem includes:    Pravastatin Sodium 40 Mg Tabs (Pravastatin sodium) .Marland Kitchen... Take 1 tablet by mouth once a day at bedtime  His updated medication list for this problem includes:    Pravastatin Sodium 40 Mg Tabs (Pravastatin sodium) .Marland Kitchen... Take 1 tablet by mouth once a day at bedtime  Orders: T-Hepatic Function [phone]) T-Lipid Profile [id number])  Problem # 4:  ELEVATED BLOOD PRESSURE WITHOUT DIAGNOSIS OF HYPERTENSION (ICD-796.2) Blood pressure controlled on present medications. Will continue.  Patient Instructions: 1)  Your physician wants you to follow-up in: 6 MONTHS  You will receive a reminder letter in the mail two months in advance. If you don't receive a letter, please call our office to schedule the follow-up appointment.

## 2010-02-24 NOTE — Medication Information (Signed)
Summary: [facility]  Anticoagulant Therapy  Managed by: Weston Brass, PharmD Referring MD: B. Crenshaw PCP: [name] MD Supervising MD: Ladona Ridgel MD, Sharlot Gowda Indication 1: Atrial Fibrillation Lab Used: [facility] [place] Site: [address] PT 16.4 INR POC 1.4 INR RANGE 2.0-3.0  Dietary changes: no    Health status changes: no    Bleeding/hemorrhagic complications: no    Recent/future hospitalizations: yes       Details: had aortic valve replacement (tissue) and developed afib post-op  Any changes in medication regimen? yes       Details: started amiodarone during hospitalization   Recent/future dental: no  Any missed doses?: no       Is patient compliant with meds? yes      Comments: INR 1.0 at discharge on 9/26.  Discharged on 5mg  daily.   Current Medications (verified): 1)  Protonix 40 Mg  Tbec (Pantoprazole Sodium) .... Take 1 Tablet By Mouth Once A Day 2)  Pravastatin Sodium 40 Mg  Tabs (Pravastatin Sodium) .... Take 1 Tablet By Mouth Once A Day At Bedtime 3)  Amiodarone Hcl 200 Mg Tabs (Amiodarone Hcl) .... Take 400mg  Two Times A Day X 10 Days Then Decrease To 200mg  Two Times A Day 4)  Aspirin 81 Mg Tbec (Aspirin) .... Take One Tablet By Mouth Daily 5)  Furosemide 40 Mg Tabs (Furosemide) .... Take One Tablet By Mouth Daily. 6)  Metoprolol Tartrate 25 Mg Tabs (Metoprolol Tartrate) .... Take One Tablet By Mouth Twice A Day 7)  Potassium Chloride Crys Cr 20 Meq Cr-Tabs (Potassium Chloride Crys Cr) .... Take One Tablet By Mouth Daily 8)  Ultracet 37.5-325 Mg Tabs (Tramadol-Acetaminophen) .... Take 1 Tablet Every 4-6 Hours As Needed For Pain 9)  Warfarin Sodium 5 Mg Tabs (Warfarin Sodium) .... Use As Directed By Anticoagulation Clinic  Allergies: No Known Drug Allergies  Anticoagulation Management History:      His anticoagulation is being managed by telephone today.  Positive risk factors for bleeding include an age of 34 years or older.   Negative risk factors for bleeding include no history of CVA/TIA.  The bleeding index is 'intermediate risk'.  Negative CHADS2 values include History of HTN, Age > 75 years old, History of Diabetes, and Prior Stroke/CVA/TIA.  Prothrombin time is 16.4.  Anticoagulation responsible provider: Ladona Ridgel MD, Sharlot Gowda.  INR POC: 1.4.    Anticoagulation Management Assessment/Plan:      The patient's current anticoagulation dose is Warfarin sodium 5 mg tabs: Use as directed by Anticoagulation Clinic.  The target INR is 2.0-3.0.  The next INR is due 10/29/2009.  Anticoagulation instructions were given to home health nurse.  Results were reviewed/authorized by Weston Brass, PharmD.  He was notified by Weston Brass PharmD.         Current Anticoagulation Instructions: INR 1.4  Spoke with Vinnie Langton, RN while in pt's home.  Continue 5mg  daily.  Recheck INR in 1 week.

## 2010-02-24 NOTE — Assessment & Plan Note (Signed)
Summary: eph.post cath/ gd   Primary Provider:  [name] MD  CC:  dizziness.  History of Present Illness: 76 year old male I saw in July of 2011 for evaluation of aortic stenosis. Last echocardiogram was performed in August fo 2011 and  revealed normal LV function, mild left ventricular hypertrophy and grade 1 diastolic dysfunction. There was severe aortic stenosis with  mean gradient through the valve of 51 mm Hg. There was mild aortic insufficiency and mitral regurgitation and mild biatrial enlargement. I last saw him in September of 2011 and we arranged cardiac catheterization. This revealed nonobstructive coronary disease, normal LV function and moderately severe aortic stenosis with a mean gradient of 34 mm of mercury. The patient then underwent aortic valve replacement on September 21 of 2011 with a pericardial tissue valve. He was also placed on amiodarone for postoperative atrial fibrillation. Since then the patient denies any dyspnea on exertion, orthopnea, PND, pedal edema, palpitations, syncope or chest pain.   Current Medications (verified): 1)  Protonix 40 Mg  Tbec (Pantoprazole Sodium) .... Take 1 Tablet By Mouth Once A Day 2)  Pravastatin Sodium 40 Mg  Tabs (Pravastatin Sodium) .... Take 1 Tablet By Mouth Once A Day At Bedtime 3)  Amiodarone Hcl 200 Mg Tabs (Amiodarone Hcl) .Marland Kitchen.. 1 Tab By Mouth Two Times A Day 4)  Aspirin 81 Mg Tbec (Aspirin) .... Take One Tablet By Mouth Daily 5)  Metoprolol Tartrate 25 Mg Tabs (Metoprolol Tartrate) .... Take One Tablet By Mouth Twice A Day 6)  Ultracet 37.5-325 Mg Tabs (Tramadol-Acetaminophen) .... Take 1 Tablet Every 4-6 Hours As Needed For Pain 7)  Warfarin Sodium 5 Mg Tabs (Warfarin Sodium) .... Use As Directed By Anticoagulation Clinic  Allergies: No Known Drug Allergies  Past History:  Past Medical History: Reviewed history from 08/20/2009 and no changes required. HYPERTENSION  HYPERLIPIDEMIA Aortic stenosis POISON IVY  DERMATITIS  Cleft palate  Past Surgical History: Bilat cataract sx 2004 cholecystectomy 1999 7 surgeries for cleft palate 1938 Oral surgery with bone graft from hip for cleft palate 1989 Right groin hernia repair 1950s Rotator cuff tear, left shoulder.  aortic valve replacement with pericardial tissue valve on October 15, 2009.  Social History: Reviewed history from 08/20/2009 and no changes required. 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  non-smoker. Alcohol use-no Drug use-no  Review of Systems       no fevers or chills, productive cough, hemoptysis, dysphasia, odynophagia, melena, hematochezia, dysuria, hematuria, rash, seizure activity, orthopnea, PND, pedal edema, claudication. Remaining systems are negative.   Vital Signs:  Patient profile:   76 year old male Height:      72 inches Weight:      234 pounds BMI:     31.85 Pulse rate:   65 / minute Resp:     14 per minute BP sitting:   138 / 81  (left arm)  Vitals Entered By: Kem Parkinson (November 05, 2009 11:47 AM)   Physical Exam  General:  Well-developed well-nourished in no acute distress.  Skin is warm and dry.  HEENT is normal.  Neck is supple. No thyromegaly.  Chest is clear to auscultation with normal expansion. sternotomy without evidence of infection. Cardiovascular exam is regular rate and rhythm. 2/6 systolic ejection murmur. No diastolic murmur. Abdominal exam nontender or distended. No masses palpated. Extremities show no edema. neuro grossly intact    Impression & Recommendations:  Problem # 1:  PAROXYSMAL ATRIAL FIBRILLATION (ICD-427.31) Pt  did have postoperative atrial fibrillation. He remains in sinus rhythm on physical exam. Decrease amiodarone to 200 mg p.o. daily and continue Coumadin. I will see him back in 8 weeks. If he remains in sinus rhythm at that time we will discontinue his Coumadin and his amiodarone. His updated  medication list for this problem includes:    Amiodarone Hcl 200 Mg Tabs (Amiodarone hcl) .Marland Kitchen... 1 tab by mouth two times a day    Aspirin 81 Mg Tbec (Aspirin) .Marland Kitchen... Take one tablet by mouth daily    Metoprolol Tartrate 25 Mg Tabs (Metoprolol tartrate) .Marland Kitchen... Take one tablet by mouth twice a day    Warfarin Sodium 5 Mg Tabs (Warfarin sodium) ..... Use as directed by anticoagulation clinic  Problem # 2:  AORTIC VALVE REPLACEMENT, HX OF (ICD-V43.3) Status post AVR. Check baseline echocardiogram. Continued SBE prophylaxis. Orders: Echocardiogram (Echo) Cardiac Rehabilitation (Cardiac Rehab)  Problem # 3:  HYPERLIPIDEMIA (ICD-272.4)  Continue statin. His updated medication list for this problem includes:    Pravastatin Sodium 40 Mg Tabs (Pravastatin sodium) .Marland Kitchen... Take 1 tablet by mouth once a day at bedtime  His updated medication list for this problem includes:    Pravastatin Sodium 40 Mg Tabs (Pravastatin sodium) .Marland Kitchen... Take 1 tablet by mouth once a day at bedtime  Problem # 4:  ELEVATED BLOOD PRESSURE WITHOUT DIAGNOSIS OF HYPERTENSION (ICD-796.2) continue Lopressor.  Patient Instructions: 1)  Your physician recommends that you schedule a follow-up appointment in: 8 WEEKS IN [place] OFF 2)  Your physician recommends that you return for lab work in: TODAY [facility] INR CHECK 3)  Your physician has recommended you make the following change in your medication: DECREASE AMIODARONE TO 200 MG 1 once daily 4)  Your physician has requested that you have an echocardiogram.  Echocardiography is a painless test that uses sound waves to create images of your heart. It provides your doctor with information about the size and shape of your heart and how well your heart's chambers and valves are working.  This procedure takes approximately one hour. There are no restrictions for this procedure.

## 2010-02-24 NOTE — Medication Information (Signed)
Summary: [facility]  Anticoagulant Therapy  Managed by: Weston Brass, PharmD Referring MD: B. Crenshaw PCP: [name] MD Supervising MD: Excell Seltzer MD, Casimiro Needle Indication 1: Atrial Fibrillation Lab Used: [facility] [place] Site: [address] INR POC 2.7 INR RANGE 2.0-3.0  Dietary changes: no    Health status changes: no    Bleeding/hemorrhagic complications: no    Recent/future hospitalizations: no    Any changes in medication regimen? no    Recent/future dental: no  Any missed doses?: no       Is patient compliant with meds? yes       Allergies: No Known Drug Allergies  Anticoagulation Management History:      The patient is taking warfarin and comes in today for a routine follow up visit.  Positive risk factors for bleeding include an age of 74 years or older.  Negative risk factors for bleeding include no history of CVA/TIA.  The bleeding index is 'intermediate risk'.  Negative CHADS2 values include History of HTN, Age > 74 years old, History of Diabetes, and Prior Stroke/CVA/TIA.  Anticoagulation responsible provider: Excell Seltzer MD, Casimiro Needle.  INR POC: 2.7.  Cuvette Lot#: [id number].  Exp: 11/2010.    Anticoagulation Management Assessment/Plan:      The patient's current anticoagulation dose is Warfarin sodium 5 mg tabs: Use as directed by Anticoagulation Clinic.  The target INR is 2.0-3.0.  The next INR is due 12/08/2009.  Anticoagulation instructions were given to home health nurse.  Results were reviewed/authorized by Weston Brass, PharmD.  He was notified by Haynes Hoehn, PharmD Candidate.         Prior Anticoagulation Instructions: INR 3.9  Skip tomorrow's dose.  Then take 1 tablet every day of the week, except 1/2 tablet on Monday, Wednesday, and Friday.  Return to clinic in 7-10 days.     Current Anticoagulation Instructions: INR 2.7  Continue Coumadin as scheduled:  1 tablet every day of the week, except 1/2 tablet on Monday, Wednesday, and Friday.   Return to clinic in 2-3 weeks.

## 2010-02-24 NOTE — Cardiovascular Report (Signed)
Summary: [facility]   Imported By: Roderic Ovens 11/19/2009 12:26:23  _____________________________________________________________________  External Attachment:    Type:   Image     Comment:   External Document

## 2010-02-24 NOTE — Miscellaneous (Signed)
Summary: [facility] Order[facility]  [facility]   Imported By: Roderic Ovens 11/04/2009 15:47:09  _____________________________________________________________________  External Attachment:    Type:   Image     Comment:   External Document

## 2010-02-24 NOTE — Letter (Signed)
Summary: [facility]  [facility]   Imported By: Sherian Rein 11/25/2009 14:39:06  _____________________________________________________________________  External Attachment:    Type:   Image     Comment:   External Document

## 2010-02-24 NOTE — Progress Notes (Signed)
  Phone Note From Pharmacy   Caller: [facility] [address]* Call For: [name]  Action Taken: Phone call completed Details for Reason: Medication on back order Details of Action Taken: Nurse spoke with pharmacist Summary of Call: I was informed Medrol was on back order, spoke with Dr. Orson Aloe and Prednisone pack 10mg  X 6 days ordered in replacement of medrol. Initial call taken by: Lajean Saver RN,  July 16, 2009 12:02 PM

## 2010-02-24 NOTE — Assessment & Plan Note (Signed)
Summary: [facility]   Primary Provider:  [name] MD   History of Present Illness: 76 year old male I saw in July of 2011 for evaluation of aortic stenosis. Last echocardiogram was performed in August fo 2011 and  revealed normal LV function, mild left ventricular hypertrophy and grade 1 diastolic dysfunction. There was severe aortic stenosis with  mean gradient through the valve of 51 mm Hg. There was mild aortic insufficiency and mitral regurgitation and mild biatrial enlargement. A nuclear study was also performed in August of 2011 and revealed a fixed inferior defect which was felt to be infarct versus diaphragm but no ischemia. Ejection fraction 63%. Since then he has dyspnea with more extreme activities but not with routine activities. There is no orthopnea, PND, pedal edema, palpitations, syncope or chest pain. and in the is a was in a knee Current Medications (verified): 1)  Protonix 40 Mg  Tbec (Pantoprazole Sodium) .... Take 1 Tablet By Mouth Once A Day 2)  Pravastatin Sodium 40 Mg  Tabs (Pravastatin Sodium) .... Take 1 Tablet By Mouth Once A Day At Bedtime 3)  Aspirin 325 Mg Tabs (Aspirin) .... Take 1 Tablet By Mouth Once A Day  Allergies (verified): No Known Drug Allergies  Past History:  Past Medical History: Reviewed history from 08/20/2009 and no changes required. HYPERTENSION  HYPERLIPIDEMIA Aortic stenosis POISON IVY DERMATITIS  Cleft palate  Past Surgical History: Reviewed history from 08/20/2009 and no changes required. Bilat cataract sx 2004 cholecystectomy 1999 7 surgeries for cleft palate 1938 Oral surgery with bone graft from hip for cleft palate 1989 Right groin hernia repair 1950s Rotator cuff tear, left shoulder.   Social History: Reviewed history from 08/20/2009 and no changes required. 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  non-smoker. Alcohol use-no Drug use-no  Review of  Systems       no fevers or chills, productive cough, hemoptysis, dysphasia, odynophagia, melena, hematochezia, dysuria, hematuria, rash, seizure activity, orthopnea, PND, pedal edema, claudication. Remaining systems are negative.   Vital Signs:  Patient profile:   76 year old male Weight:      243 pounds Pulse rate:   60 / minute Pulse rhythm:   regular BP sitting:   110 / 60  (right arm) Cuff size:   large  Vitals Entered By: Deliah Goody, RN (October 08, 2009 2:17 PM)  Physical Exam  General:  Well-developed well-nourished in no acute distress.  Skin is warm and dry.  HEENT is normal. Previous cleft palate. Neck is supple. No thyromegaly.  Chest is clear to auscultation with normal expansion.  Cardiovascular exam is regular rate and rhythm. 3/6 systolic murmur left sternal border Abdominal exam nontender or distended. No masses palpated. Extremities show no edema. neuro grossly intact    Impression & Recommendations:  Problem # 1:  AORTIC VALVE DISORDERS (ICD-424.1) Patient's aortic stenosis has progressed and is now severe. His mean gradient is 51 mm of mercury. He has dyspnea with more extreme activities. He will certainly require aortic valve replacement and given the progression of his gradient over one year I think we should proceed. He will need a right and left cardiac catheterization prior to surgery. The risks and benefits including but not limited to death, or infarction and stroke have been discussed and he agrees to proceed. We will arrange for him to be seen by one of our surgeons as an outpatient. Continue aspirin and statin.  Problem # 2:  HYPERLIPIDEMIA (ICD-272.4)  Continue statin. Lipids and liver monitored by primary care. His updated medication list for this problem includes:    Pravastatin Sodium 40 Mg Tabs (Pravastatin sodium) .Marland Kitchen... Take 1 tablet by mouth once a day at bedtime  Other Orders: Cardiac Catheterization (Cardiac Cath) T-Basic Metabolic  Panel ([id number]) T-CBC w/Diff ([id number]) T-Protime, Auto ([id number]) TCTS Referral (TCTS Ref) T-2 View CXR (71020TC)  Patient Instructions: 1)  Your physician recommends that you schedule a follow-up appointment in: 3 MONTHS 2)  You have been referred to DR CLEARANCE OWEN 3)  Your physician has requested that you have a cardiac catheterization.  Cardiac catheterization is used to diagnose and/or treat various heart conditions. Doctors may recommend this procedure for a number of different reasons. The most common reason is to evaluate chest pain. Chest pain can be a symptom of coronary artery disease (CAD), and cardiac catheterization can show whether plaque is narrowing or blocking your heart's arteries. This procedure is also used to evaluate the valves, as well as measure the blood flow and oxygen levels in different parts of your heart.  For further information please visit [email or web address]  Please follow instruction sheet, as given.

## 2010-02-24 NOTE — Medication Information (Signed)
Summary: [facility]  Anticoagulant Therapy  Managed by: Weston Brass, PharmD Referring MD: B. Crenshaw PCP: [name] MD Supervising MD: Graciela Husbands MD, Viviann Spare Indication 1: Atrial Fibrillation Lab Used: [facility] [place] Site: [address] INR POC 3.9 INR RANGE 2.0-3.0  Dietary changes: no    Health status changes: no    Bleeding/hemorrhagic complications: no    Recent/future hospitalizations: no    Any changes in medication regimen? no    Recent/future dental: no  Any missed doses?: no       Is patient compliant with meds? yes       Allergies: No Known Drug Allergies  Anticoagulation Management History:      The patient is taking warfarin and comes in today for a routine follow up visit.  Positive risk factors for bleeding include an age of 74 years or older.  Negative risk factors for bleeding include no history of CVA/TIA.  The bleeding index is 'intermediate risk'.  Negative CHADS2 values include History of HTN, Age > 46 years old, History of Diabetes, and Prior Stroke/CVA/TIA.  Anticoagulation responsible Nagee Goates: Graciela Husbands MD, Viviann Spare.  INR POC: 3.9.  Cuvette Lot#: [id number].  Exp: 11/2010.    Anticoagulation Management Assessment/Plan:      The patient's current anticoagulation dose is Warfarin sodium 5 mg tabs: Use as directed by Anticoagulation Clinic.  The target INR is 2.0-3.0.  The next INR is due 11/21/2009.  Anticoagulation instructions were given to home health nurse.  Results were reviewed/authorized by Weston Brass, PharmD.  He was notified by Haynes Hoehn, PharmD Candidate.         Prior Anticoagulation Instructions: INR 2.7  Continue taking 1 tablet everyday except 1/2 tablet on Monday and Friday. Recheck in 1 week.  Current Anticoagulation Instructions: INR 3.9  Skip tomorrow's dose.  Then take 1 tablet every day of the week, except 1/2 tablet on Monday, Wednesday, and Friday.  Return to clinic in 7-10 days.

## 2010-02-24 NOTE — Consult Note (Signed)
Summary: [facility]  [facility]   Imported By: Lanelle Bal 10/27/2009 11:07:44  _____________________________________________________________________  External Attachment:    Type:   Image     Comment:   External Document

## 2010-02-24 NOTE — Miscellaneous (Signed)
Summary: [facility]   [facility]   Imported By: Roderic Ovens 11/12/2009 16:07:48  _____________________________________________________________________  External Attachment:    Type:   Image     Comment:   External Document

## 2010-02-24 NOTE — Assessment & Plan Note (Signed)
Summary: Cardiology Nuclear Testing  Nuclear Med Background Indications for Stress Test: Evaluation for Ischemia   History: Echo  History Comments: 6/10- Echo- Nl. EF; Mod. Aortic stenosis  Symptoms: DOE, Fatigue with Exertion    Nuclear Pre-Procedure Cardiac Risk Factors: Family History - CAD, History of Smoking, Hypertension, Lipids Caffeine/Decaff Intake: none NPO After: 8:00 PM Lungs: Clear IV 0.9% NS with Angio Cath: 22g     IV Site: R hand IV Started by: Darrick Penna Chest Size (in) 48     Height (in): 72 Weight (lb): 250 BMI: 34.03  Nuclear Med Study 1 or 2 day study:  1 day     Stress Test Type:  Eugenie Birks Reading MD:  Arvilla Meres, MD     Referring MD:  B. Crenshaw Resting Radionuclide:  Technetium 62m Tetrofosmin     Resting Radionuclide Dose:  10.2 mCi  Stress Radionuclide:  Technetium 79m Tetrofosmin     Stress Radionuclide Dose:  32.6 mCi   Stress Protocol      Max HR:  82 bpm     Predicted Max HR:  147 bpm  Max Systolic BP: 110 mm Hg     Percent Max HR:  55.78 %Rate Pressure Product:  9020  Lexiscan: 0.4 mg   Stress Test Technologist:  Irean Hong RN     Nuclear Technologist:  Domenic Polite CNMT  Rest Procedure  Myocardial perfusion imaging was performed at rest 45 minutes following the intravenous administration of Myoview Technetium 23m Tetrofosmin.  Stress Procedure  The patient attempted exercise treadmill but unable to reach target heartrate due to bilateral hip and leg pain 7/10. There were nonspecificST-T changes, frequent PVC's, PAC's, and PJC, changed to lexiscan.The patient received IV Lexiscan 0.4 mg over 15-seconds.  Myoview injected at 30-seconds.  There were no significant changes with infusion.  Quantitative spect images were obtained after a 45 minute delay.  QPS Raw Data Images:  Normal; no motion artifact; normal heart/lung ratio. Stress Images:  Decreased uptake in the inferior wall. Rest Images:  Decreased uptake in the  inferior wall. Subtraction (SDS):  Mild fixed defect in the inferior wall. Probable diaphragmatic attenuation. Cannot exclude previous inferior infarct. Transient Ischemic Dilatation:  .93  (Normal <1.22)  Lung/Heart Ratio:  .30  (Normal <0.45)  Quantitative Gated Spect Images QGS EDV:  131 ml QGS ESV:  49 ml QGS EF:  63 % QGS cine images:  Normal  Findings Low risk nuclear study      Overall Impression  Exercise Capacity: Lexiscan study with no exercise. ECG Impression: Baseline: NSR; No significant ST segment change with Lexiscan. Overall Impression: Low risk stress nuclear study. Overall Impression Comments: Mild fixed defect in the inferior wall. Probable diaphragmatic attenuation. Cannot exclude previous inferior infarct.  Appended Document: Cardiology Nuclear Testing ok  Appended Document: Cardiology Nuclear Testing pt aware of results

## 2010-02-24 NOTE — Miscellaneous (Signed)
Summary: [facility]   [facility]   Imported By: Roderic Ovens 12/09/2009 12:04:58  _____________________________________________________________________  External Attachment:    Type:   Image     Comment:   External Document

## 2010-02-24 NOTE — Medication Information (Signed)
Summary: [facility]in Clinic  Anticoagulant Therapy  Managed by: Inactive Referring MD: B. Crenshaw PCP: [name] MD Supervising MD: Jens Som MD, Arlys John Indication 1: Atrial Fibrillation Lab Used: [facility] [place] Site: [address] INR RANGE 2.0-3.0          Comments: Coumadin discontined by Dr Jens Som  Allergies: No Known Drug Allergies  Anticoagulation Management History:      Positive risk factors for bleeding include an age of 74 years or older.  Negative risk factors for bleeding include no history of CVA/TIA.  The bleeding index is 'intermediate risk'.  Negative CHADS2 values include History of HTN, Age > 74 years old, History of Diabetes, and Prior Stroke/CVA/TIA.  Anticoagulation responsible provider: Jens Som MD, Arlys John.  Exp: 11/2010.    Anticoagulation Management Assessment/Plan:      The patient's current anticoagulation dose is Warfarin sodium 5 mg tabs: Use as directed by Anticoagulation Clinic.  The target INR is 2.0-3.0.  The next INR is due 01/05/2010.  Anticoagulation instructions were given to home health nurse.  Results were reviewed/authorized by Inactive.         Prior Anticoagulation Instructions: INR 2.5  Continue same dose of 1 tablet every day except 1/2 tablet on Monday, Wednesday and Friday.  Recheck INR in 4 weeks.

## 2010-02-24 NOTE — Cardiovascular Report (Signed)
Summary: Pre-Cath Orders  Pre-Cath Orders   Imported By: Marylou Mccoy 10/21/2009 12:07:57  _____________________________________________________________________  External Attachment:    Type:   Image     Comment:   External Document

## 2010-02-24 NOTE — Medication Information (Signed)
Summary: Coumadin Clinic  Anticoagulant Therapy  Managed by: Weston Brass, PharmD Referring MD: B. Crenshaw PCP: [name] MD Supervising MD: Clifton James MD, Cristal Deer Indication 1: Atrial Fibrillation Lab Used: [facility] [place] Site: [address] PT 38.1 INR POC 3.2 INR RANGE 2.0-3.0  Dietary changes: no    Health status changes: no    Bleeding/hemorrhagic complications: no    Recent/future hospitalizations: no    Any changes in medication regimen? no    Recent/future dental: no  Any missed doses?: no       Is patient compliant with meds? yes       Allergies: No Known Drug Allergies  Anticoagulation Management History:      His anticoagulation is being managed by telephone today.  Positive risk factors for bleeding include an age of 74 years or older.  Negative risk factors for bleeding include no history of CVA/TIA.  The bleeding index is 'intermediate risk'.  Negative CHADS2 values include History of HTN, Age > 32 years old, History of Diabetes, and Prior Stroke/CVA/TIA.  Prothrombin time is 38.1.  Anticoagulation responsible provider: Clifton James MD, Cristal Deer.  INR POC: 3.2.    Anticoagulation Management Assessment/Plan:      The patient's current anticoagulation dose is Warfarin sodium 5 mg tabs: Use as directed by Anticoagulation Clinic.  The target INR is 2.0-3.0.  The next INR is due 11/05/2009.  Anticoagulation instructions were given to home health nurse.  Results were reviewed/authorized by Weston Brass, PharmD.  He was notified by Weston Brass PharmD.         Prior Anticoagulation Instructions: INR 1.4  Spoke with Vinnie Langton, RN while in pt's home.  Continue 5mg  daily.  Recheck INR in 1 week.   Current Anticoagulation Instructions: INR 3.2  Spoke with patient.  Skip tomorrow's dose of Coumadin then decrease dose to 1 tablet every day except 1/2 tablet on Monday and Friday.  Recheck INR in 1 week. Orders given to Al with [facility].

## 2010-02-24 NOTE — Progress Notes (Signed)
Summary: Refferal  Phone Note Call from Patient   Caller: Patient Summary of Call: Dr.Metheney  Patient want to get a referral to see a Heart Dr. Initial call taken by: Vanessa Swaziland,  July 16, 2009 11:21 AM  Follow-up for Phone Call        Victorino Dike this pt had an echo done at [facility] with Dr. Tenny Craw. Can you refer him back Follow-up by: Kathlene November,  July 16, 2009 11:35 AM  Additional Follow-up for Phone Call Additional follow up Details #1::        i called this patient and him to return my phone call so i can get him set up with Card Dr. Michaelle Copas  July 18, 2009 12:00 PM  Additional Follow-up by: Michaelle Copas,  July 18, 2009 12:00 PM

## 2010-02-24 NOTE — Miscellaneous (Signed)
Summary: [facility] Orders   [facility] Orders   Imported By: Roderic Ovens 11/12/2009 16:08:10  _____________________________________________________________________  External Attachment:    Type:   Image     Comment:   External Document

## 2010-02-24 NOTE — Medication Information (Signed)
Summary: rov/cs  Anticoagulant Therapy  Managed by: Weston Brass, PharmD Referring MD: B. Crenshaw PCP: [name] MD Supervising MD: Ladona Ridgel MD, Sharlot Gowda Indication 1: Atrial Fibrillation Lab Used: [facility] [place] Site: [address] INR POC 2.5 INR RANGE 2.0-3.0  Dietary changes: no    Health status changes: no    Bleeding/hemorrhagic complications: no    Recent/future hospitalizations: no    Any changes in medication regimen? no    Recent/future dental: no  Any missed doses?: no       Is patient compliant with meds? yes       Allergies: No Known Drug Allergies  Anticoagulation Management History:      The patient is taking warfarin and comes in today for a routine follow up visit.  Positive risk factors for bleeding include an age of 74 years or older.  Negative risk factors for bleeding include no history of CVA/TIA.  The bleeding index is 'intermediate risk'.  Negative CHADS2 values include History of HTN, Age > 74 years old, History of Diabetes, and Prior Stroke/CVA/TIA.  Anticoagulation responsible provider: Ladona Ridgel MD, Sharlot Gowda.  INR POC: 2.5.  Cuvette Lot#: [id number].  Exp: 11/2010.    Anticoagulation Management Assessment/Plan:      The patient's current anticoagulation dose is Warfarin sodium 5 mg tabs: Use as directed by Anticoagulation Clinic.  The target INR is 2.0-3.0.  The next INR is due 01/05/2010.  Anticoagulation instructions were given to home health nurse.  Results were reviewed/authorized by Weston Brass, PharmD.  He was notified by Lyna Poser PharmD.         Prior Anticoagulation Instructions: INR 2.7  Continue Coumadin as scheduled:  1 tablet every day of the week, except 1/2 tablet on Monday, Wednesday, and Friday.  Return to clinic in 2-3 weeks.   Current Anticoagulation Instructions: INR 2.5  Continue same dose of 1 tablet every day except 1/2 tablet on Monday, Wednesday and Friday.  Recheck INR in 4 weeks.

## 2010-02-25 ENCOUNTER — Ambulatory Visit (HOSPITAL_COMMUNITY): Payer: Self-pay

## 2010-02-26 NOTE — Letter (Signed)
Summary: ED & AUA Questionnaire  ED & AUA Questionnaire   Imported By: Lanelle Bal 02/02/2010 12:16:34  _____________________________________________________________________  External Attachment:    Type:   Image     Comment:   External Document

## 2010-02-26 NOTE — Letter (Signed)
Summary: Custom - Lipid  [facility], Main Office  [address]   [place], [place] [address]   Phone: [phone]  Fax: [phone]     January 05, 2010 MRN: [id number]   [facility] [address] [place], [place]  [address]   Dear Mr. Pizzo,  We have reviewed your cholesterol results.  They are as follows:     Total Cholesterol:    160 (Desirable: less than 200)       HDL  Cholesterol:     43  (Desirable: greater than 40 for men and 50 for women)       LDL Cholesterol:       96  (Desirable: less than 100 for low risk and less than 70 for moderate to high risk)       Triglycerides:       107  (Desirable: less than 150)  Our recommendations include:These numbers look good. Continue on the same medicine. Liver function is normal. Take care, Dr. Darel Hong.    Call our office at the number listed above if you have any questions.  Lowering your LDL cholesterol is important, but it is only one of a large number of "risk factors" that may indicate that you are at risk for heart disease, stroke or other complications of hardening of the arteries.  Other risk factors include:   A.  Cigarette Smoking* B.  High Blood Pressure* C.  Obesity* D.   Low HDL Cholesterol (see yours above)* E.   Diabetes Mellitus (higher risk if your is uncontrolled) F.  Family history of premature heart disease G.  Previous history of stroke or cardiovascular disease    *These are risk factors YOU HAVE CONTROL OVER.  For more information, visit .  There is now evidence that lowering the TOTAL CHOLESTEROL AND LDL CHOLESTEROL can reduce the risk of heart disease.  The American Heart Association recommends the following guidelines for the treatment of elevated cholesterol:  1.  If there is now current heart disease and less than two risk factors, TOTAL CHOLESTEROL should be less than 200 and LDL CHOLESTEROL should be less than 100. 2.  If there is current heart disease or  two or more risk factors, TOTAL CHOLESTEROL should be less than 200 and LDL CHOLESTEROL should be less than 70.  A diet low in cholesterol, saturated fat, and calories is the cornerstone of treatment for elevated cholesterol.  Cessation of smoking and exercise are also important in the management of elevated cholesterol and preventing vascular disease.  Studies have shown that 30 to 60 minutes of physical activity most days can help lower blood pressure, lower cholesterol, and keep your weight at a healthy level.  Drug therapy is used when cholesterol levels do not respond to therapeutic lifestyle changes (smoking cessation, diet, and exercise) and remains unacceptably high.  If medication is started, it is important to have you levels checked periodically to evaluate the need for further treatment options.  Thank you,    [employer] Team

## 2010-02-26 NOTE — Miscellaneous (Signed)
Summary: [facility] Progress Report   [facility] Progress Report   Imported By: Roderic Ovens 01/07/2010 10:43:36  _____________________________________________________________________  External Attachment:    Type:   Image     Comment:   External Document

## 2010-02-26 NOTE — Assessment & Plan Note (Signed)
Summary: Follow up from his heart surgery, ED   Vital Signs:  Patient profile:   76 year old male Height:      72 inches Weight:      240 pounds Pulse rate:   71 / minute BP sitting:   113 / 59  (right arm) Cuff size:   large  Vitals Entered By: Avon Gully CMA, Duncan Dull) (January 22, 2010 10:36 AM) CC: possible bld work to confirm DM   Primary Care Anina Schnake:  [name] MD  CC:  possible bld work to confirm DM.  History of Present Illness: Her to f/u on elevated sugar. Has been 1 year since last check. HAd aortic vavle replacement in September. In cardiac rehab and doing very well overall. he is very happy with his results.  He has noticed that his energy level has improved significantly.  Noticed a lump on his left forearm. he says he noticed it about a month ago.  He is a little bit tender but otherwise not bothersome.  He does want to look at today and make sure that the cane.  He has been taking his daily aspirin since his aortic valve replacement.  he complains of erectile dysfunction for the last two to 3 years.  It is gradually gotten worse.  He says initially it wasn't much of a problem as his wife has been ill herself and they have been unable to have intercourse enemas two years.  He is feeling more healthy and she is going to help these is hopeful that this will happen.  He denies any night time urinary symptoms.  He is mostly having difficulty with maintaining and keeping an erection.  He denies any decrease in libido.  Current Medications (verified): 1)  Protonix 40 Mg  Tbec (Pantoprazole Sodium) .... Take 1 Tablet By Mouth Once A Day 2)  Pravastatin Sodium 40 Mg  Tabs (Pravastatin Sodium) .... Take 1 Tablet By Mouth Once A Day At Bedtime 3)  Aspirin 81 Mg Tbec (Aspirin) .... Take One Tablet By Mouth Daily 4)  Metoprolol Tartrate 25 Mg Tabs (Metoprolol Tartrate) .... Take One Tablet By Mouth Twice A Day  Allergies (verified): No Known Drug  Allergies  Comments:  Nurse/Medical Assistant: The patient's medications and allergies were reviewed with the patient and were updated in the Medication and Allergy Lists. Avon Gully CMA, Duncan Dull) (January 22, 2010 10:37 AM)  Past History:  Past Surgical History: Bilat cataract sx 2004 cholecystectomy 1999 7 surgeries for cleft palate 1938 Oral surgery with bone graft from hip for cleft palate 1989 Right groin hernia repair 1950s Rotator cuff tear, left shoulder.  aortic valve replacement with pericardial tissue valve on October 15, 2009 by Dr. Barry Dienes  Physical Exam  General:  Well-developed,well-nourished,in no acute distress; alert,appropriate and cooperative throughout examination Head:  Normocephalic and atraumatic without obvious abnormalities. No apparent alopecia or balding. Mouth:  cleft lip Neck:  No deformities, masses, or tenderness noted. Lungs:  Normal respiratory effort, chest expands symmetrically. Lungs are clear to auscultation, no crackles or wheezes. Heart:  Normal rate and regular rhythm. S1 and S2 normal without gallop, murmur, click, rub or other extra sounds. Skin:  no rashes.   on his left forearm he has a swollen and enlarged firm a superficial vein.  It is not tender to touch.  There is no overlying erythema. Psych:  Cognition and judgment appear intact. Alert and cooperative with normal attention span and concentration. No apparent delusions, illusions, hallucinations   Impression &  Recommendations:  Problem # 1:  AORTIC VALVE DISORDERS (ICD-424.1) cephalad he is doing well.  His blood pressure looks wonderful today.  He continues to take his aspirin and beta-blocker.  I have updated his chart.he says he recently had a cholesterol check done about a week ago.  I do not have these results but hopefully will get these in the mail. His updated medication list for this problem includes:    Aspirin 81 Mg Tbec (Aspirin) .Marland Kitchen... Take one tablet by mouth  daily    Metoprolol Tartrate 25 Mg Tabs (Metoprolol tartrate) .Marland Kitchen... Take one tablet by mouth twice a day  Problem # 2:  PHLEBITIS (ICD-451.9)  on his forearm he has phlebitis.  I explained the diagnosis to him and let him that this does usually resolve.  We did give him an Ace wrap and rest the area.  He can continue with his current dose of aspirin.  With his recent surgery is not felt safe to increase his aspirin they typically we do this for phlebitis.  If the area is not getting less swollen on softer of the next two to 3 weeks he is to call the office. His updated medication list for this problem includes:    Aspirin 81 Mg Tbec (Aspirin) .Marland Kitchen... Take one tablet by mouth daily  Orders: Ace  Bandage < 3in. [phone])  Problem # 3:  ERECTILE DYSFUNCTION, ORGANIC (ICD-607.84) his AUA score was 4 which is mild.  He filled out and erectile dysfunction questionnaire and his score was 9 which is very significant.  On his low testosterone questionnaire he answered yes to 4 for questions but I think this can be explained mostly by the ED and his recent cardiac surgery.  Thus at this time I will not order a testosterone levels.  Will check a CBC and a CMP.  In the meantime we can start by a sample of Cialis.  I gave him a prescription a coupon for 340 tabs.  He can let me know this works well or if he would like to try a different medication.  His cardiologist is told him that it is okay for him to take one use medications.  He does not use nitrites and should not use this with the medication. His updated medication list for this problem includes:    Cialis 20 Mg Tabs (Tadalafil) .Marland Kitchen... Take one about 1-2 hours before sexual intercourse.  Complete Medication List: 1)  Protonix 40 Mg Tbec (Pantoprazole sodium) .... Take 1 tablet by mouth once a day 2)  Pravastatin Sodium 40 Mg Tabs (Pravastatin sodium) .... Take 1 tablet by mouth once a day at bedtime 3)  Aspirin 81 Mg Tbec (Aspirin) .... Take one tablet by  mouth daily 4)  Metoprolol Tartrate 25 Mg Tabs (Metoprolol tartrate) .... Take one tablet by mouth twice a day 5)  Cialis 20 Mg Tabs (Tadalafil) .... Take one about 1-2 hours before sexual intercourse.  Other Orders: Fingerstick (60454) Glucose, (CBG) (09811) T-Comprehensive Metabolic Panel [phone]) T-TSH ([phone]) T-PSA (96295-28413)  Patient Instructions: 1)  Let recheck blood sugar in 6 months.   2)  We will call you with your lab results. Use ace wrap on his forearm daily for about 2 weeks. Call if the area is not improving and getting less swollen and more soft.  Prescriptions: CIALIS 20 MG TABS (TADALAFIL) TAke one about 1-2 hours before sexual intercourse.  #3 x 0   Entered and Authorized by:   [name] MD  Signed by:   [name] MD on 01/22/2010   Method used:   Print then Give to Patient   RxID:   [id number]    Orders Added: 1)  Fingerstick [36416] 2)  Glucose, (CBG) [82962] 3)  T-Comprehensive Metabolic Panel [80053-22900] 4)  T-TSH [69629-52841] 5)  T-PSA [32440-10272] 6)  Est. Patient Level IV [53664] 7)  Ace  Bandage < 3in. [Q0347]    Laboratory Results   Blood Tests   Date/Time Received: 01/22/10 Date/Time Reported: 01/22/10  CBG Fasting:: 111mg /dL

## 2010-02-27 ENCOUNTER — Ambulatory Visit (HOSPITAL_COMMUNITY): Payer: Self-pay

## 2010-03-02 ENCOUNTER — Ambulatory Visit (HOSPITAL_COMMUNITY): Payer: Self-pay

## 2010-03-04 ENCOUNTER — Ambulatory Visit (HOSPITAL_COMMUNITY): Payer: Self-pay

## 2010-03-06 ENCOUNTER — Ambulatory Visit (HOSPITAL_COMMUNITY): Payer: Self-pay

## 2010-03-09 ENCOUNTER — Ambulatory Visit (HOSPITAL_COMMUNITY): Payer: Self-pay

## 2010-03-11 ENCOUNTER — Ambulatory Visit (HOSPITAL_COMMUNITY): Payer: Self-pay

## 2010-03-13 ENCOUNTER — Ambulatory Visit (HOSPITAL_COMMUNITY): Payer: Self-pay

## 2010-03-16 ENCOUNTER — Ambulatory Visit (HOSPITAL_COMMUNITY): Payer: Self-pay

## 2010-03-18 ENCOUNTER — Ambulatory Visit (HOSPITAL_COMMUNITY): Payer: Self-pay

## 2010-03-18 NOTE — Letter (Signed)
Summary: [facility]   [facility]   Imported By: Kassie Mends 03/11/2010 11:07:41  _____________________________________________________________________  External Attachment:    Type:   Image     Comment:   External Document

## 2010-03-20 ENCOUNTER — Ambulatory Visit (HOSPITAL_COMMUNITY): Payer: Self-pay

## 2010-03-23 ENCOUNTER — Ambulatory Visit (HOSPITAL_COMMUNITY): Payer: Self-pay

## 2010-03-25 ENCOUNTER — Ambulatory Visit (HOSPITAL_COMMUNITY): Payer: Self-pay

## 2010-03-27 ENCOUNTER — Ambulatory Visit (HOSPITAL_COMMUNITY): Payer: Self-pay

## 2010-03-30 ENCOUNTER — Ambulatory Visit (HOSPITAL_COMMUNITY): Payer: Self-pay

## 2010-04-01 ENCOUNTER — Ambulatory Visit (HOSPITAL_COMMUNITY): Payer: Self-pay

## 2010-04-03 ENCOUNTER — Ambulatory Visit (HOSPITAL_COMMUNITY): Payer: Self-pay

## 2010-04-06 ENCOUNTER — Ambulatory Visit (HOSPITAL_COMMUNITY): Payer: Self-pay

## 2010-04-08 ENCOUNTER — Ambulatory Visit (HOSPITAL_COMMUNITY): Payer: Self-pay

## 2010-04-09 LAB — MAGNESIUM
Magnesium: 2.5 mg/dL (ref 1.5–2.5)
Magnesium: 2.9 mg/dL — ABNORMAL HIGH (ref 1.5–2.5)

## 2010-04-09 LAB — CBC
HCT: 26.7 % — ABNORMAL LOW (ref 39.0–52.0)
HCT: 27.2 % — ABNORMAL LOW (ref 39.0–52.0)
HCT: 28.6 % — ABNORMAL LOW (ref 39.0–52.0)
Hemoglobin: 10.1 g/dL — ABNORMAL LOW (ref 13.0–17.0)
Hemoglobin: 8.9 g/dL — ABNORMAL LOW (ref 13.0–17.0)
MCH: 24 pg — ABNORMAL LOW (ref 26.0–34.0)
MCH: 24.6 pg — ABNORMAL LOW (ref 26.0–34.0)
MCH: 24.8 pg — ABNORMAL LOW (ref 26.0–34.0)
MCH: 25.6 pg — ABNORMAL LOW (ref 26.0–34.0)
MCHC: 31.8 g/dL (ref 30.0–36.0)
MCV: 75.3 fL — ABNORMAL LOW (ref 78.0–100.0)
MCV: 77.2 fL — ABNORMAL LOW (ref 78.0–100.0)
MCV: 78.4 fL (ref 78.0–100.0)
MCV: 79.4 fL (ref 78.0–100.0)
Platelets: 107 10*3/uL — ABNORMAL LOW (ref 150–400)
Platelets: 125 10*3/uL — ABNORMAL LOW (ref 150–400)
Platelets: 126 10*3/uL — ABNORMAL LOW (ref 150–400)
Platelets: 99 10*3/uL — ABNORMAL LOW (ref 150–400)
RBC: 3.38 MIL/uL — ABNORMAL LOW (ref 4.22–5.81)
RBC: 3.42 MIL/uL — ABNORMAL LOW (ref 4.22–5.81)
RBC: 3.47 MIL/uL — ABNORMAL LOW (ref 4.22–5.81)
RBC: 3.6 MIL/uL — ABNORMAL LOW (ref 4.22–5.81)
RBC: 4.21 MIL/uL — ABNORMAL LOW (ref 4.22–5.81)
RDW: 16.6 % — ABNORMAL HIGH (ref 11.5–15.5)
RDW: 17.6 % — ABNORMAL HIGH (ref 11.5–15.5)
RDW: 17.7 % — ABNORMAL HIGH (ref 11.5–15.5)
WBC: 10.9 10*3/uL — ABNORMAL HIGH (ref 4.0–10.5)
WBC: 13.2 10*3/uL — ABNORMAL HIGH (ref 4.0–10.5)
WBC: 8.8 10*3/uL (ref 4.0–10.5)

## 2010-04-09 LAB — POCT I-STAT 4, (NA,K, GLUC, HGB,HCT)
Glucose, Bld: 108 mg/dL — ABNORMAL HIGH (ref 70–99)
Glucose, Bld: 108 mg/dL — ABNORMAL HIGH (ref 70–99)
Glucose, Bld: 115 mg/dL — ABNORMAL HIGH (ref 70–99)
Glucose, Bld: 157 mg/dL — ABNORMAL HIGH (ref 70–99)
HCT: 21 % — ABNORMAL LOW (ref 39.0–52.0)
HCT: 23 % — ABNORMAL LOW (ref 39.0–52.0)
HCT: 26 % — ABNORMAL LOW (ref 39.0–52.0)
HCT: 29 % — ABNORMAL LOW (ref 39.0–52.0)
Hemoglobin: 7.1 g/dL — ABNORMAL LOW (ref 13.0–17.0)
Hemoglobin: 7.8 g/dL — ABNORMAL LOW (ref 13.0–17.0)
Potassium: 4 mEq/L (ref 3.5–5.1)
Potassium: 4.5 mEq/L (ref 3.5–5.1)
Sodium: 135 mEq/L (ref 135–145)
Sodium: 140 mEq/L (ref 135–145)

## 2010-04-09 LAB — POCT I-STAT 3, ART BLOOD GAS (G3+)
Acid-base deficit: 1 mmol/L (ref 0.0–2.0)
Bicarbonate: 24 mEq/L (ref 20.0–24.0)
O2 Saturation: 92 %
O2 Saturation: 99 %
Patient temperature: 35.5
TCO2: 26 mmol/L (ref 0–100)
TCO2: 28 mmol/L (ref 0–100)
pCO2 arterial: 37.1 mmHg (ref 35.0–45.0)
pCO2 arterial: 39.5 mmHg (ref 35.0–45.0)
pCO2 arterial: 41.4 mmHg (ref 35.0–45.0)
pH, Arterial: 7.385 (ref 7.350–7.450)
pH, Arterial: 7.416 (ref 7.350–7.450)
pO2, Arterial: 133 mmHg — ABNORMAL HIGH (ref 80.0–100.0)
pO2, Arterial: 64 mmHg — ABNORMAL LOW (ref 80.0–100.0)

## 2010-04-09 LAB — TYPE AND SCREEN

## 2010-04-09 LAB — POCT I-STAT 3, VENOUS BLOOD GAS (G3P V)
O2 Saturation: 60 %
TCO2: 26 mmol/L (ref 0–100)
pCO2, Ven: 44.8 mmHg — ABNORMAL LOW (ref 45.0–50.0)

## 2010-04-09 LAB — PREPARE FRESH FROZEN PLASMA

## 2010-04-09 LAB — GLUCOSE, CAPILLARY
Glucose-Capillary: 101 mg/dL — ABNORMAL HIGH (ref 70–99)
Glucose-Capillary: 103 mg/dL — ABNORMAL HIGH (ref 70–99)
Glucose-Capillary: 106 mg/dL — ABNORMAL HIGH (ref 70–99)
Glucose-Capillary: 108 mg/dL — ABNORMAL HIGH (ref 70–99)
Glucose-Capillary: 109 mg/dL — ABNORMAL HIGH (ref 70–99)
Glucose-Capillary: 114 mg/dL — ABNORMAL HIGH (ref 70–99)
Glucose-Capillary: 115 mg/dL — ABNORMAL HIGH (ref 70–99)
Glucose-Capillary: 121 mg/dL — ABNORMAL HIGH (ref 70–99)
Glucose-Capillary: 122 mg/dL — ABNORMAL HIGH (ref 70–99)
Glucose-Capillary: 122 mg/dL — ABNORMAL HIGH (ref 70–99)
Glucose-Capillary: 132 mg/dL — ABNORMAL HIGH (ref 70–99)
Glucose-Capillary: 134 mg/dL — ABNORMAL HIGH (ref 70–99)
Glucose-Capillary: 137 mg/dL — ABNORMAL HIGH (ref 70–99)
Glucose-Capillary: 140 mg/dL — ABNORMAL HIGH (ref 70–99)
Glucose-Capillary: 156 mg/dL — ABNORMAL HIGH (ref 70–99)
Glucose-Capillary: 96 mg/dL (ref 70–99)
Glucose-Capillary: 97 mg/dL (ref 70–99)
Glucose-Capillary: 98 mg/dL (ref 70–99)

## 2010-04-09 LAB — POCT I-STAT, CHEM 8
BUN: 16 mg/dL (ref 6–23)
Calcium, Ion: 1.11 mmol/L — ABNORMAL LOW (ref 1.12–1.32)
Chloride: 106 mEq/L (ref 96–112)
HCT: 30 % — ABNORMAL LOW (ref 39.0–52.0)
Potassium: 4.1 mEq/L (ref 3.5–5.1)
Sodium: 139 mEq/L (ref 135–145)

## 2010-04-09 LAB — BASIC METABOLIC PANEL
BUN: 25 mg/dL — ABNORMAL HIGH (ref 6–23)
BUN: 28 mg/dL — ABNORMAL HIGH (ref 6–23)
CO2: 22 mEq/L (ref 19–32)
Calcium: 8 mg/dL — ABNORMAL LOW (ref 8.4–10.5)
Calcium: 8.4 mg/dL (ref 8.4–10.5)
Chloride: 103 mEq/L (ref 96–112)
Creatinine, Ser: 1.15 mg/dL (ref 0.4–1.5)
Creatinine, Ser: 1.21 mg/dL (ref 0.4–1.5)
Creatinine, Ser: 1.45 mg/dL (ref 0.4–1.5)
GFR calc Af Amer: >60 mL/min (ref >60)
GFR calc Af Amer: >60 mL/min (ref >60)
GFR calc non Af Amer: 59 mL/min — ABNORMAL LOW (ref >60)
GFR calc non Af Amer: >60 mL/min (ref >60)
Potassium: 4.1 mEq/L (ref 3.5–5.1)
Sodium: 139 mEq/L (ref 135–145)

## 2010-04-09 LAB — COMPREHENSIVE METABOLIC PANEL
BUN: 12 mg/dL (ref 6–23)
CO2: 23 mEq/L (ref 19–32)
Chloride: 110 mEq/L (ref 96–112)
Creatinine, Ser: 0.95 mg/dL (ref 0.4–1.5)
GFR calc non Af Amer: >60 mL/min (ref >60)
Glucose, Bld: 94 mg/dL (ref 70–99)
Total Bilirubin: 0.6 mg/dL (ref 0.3–1.2)

## 2010-04-09 LAB — HEMOGLOBIN A1C: Mean Plasma Glucose: 137 mg/dL — ABNORMAL HIGH (ref <117)

## 2010-04-09 LAB — SURGICAL PCR SCREEN: MRSA, PCR: NEGATIVE

## 2010-04-09 LAB — ABO/RH: ABO/RH(D): O POS

## 2010-04-09 LAB — URINALYSIS, ROUTINE W REFLEX MICROSCOPIC
Leukocytes, UA: NEGATIVE
Protein, ur: 30 mg/dL — AB
Specific Gravity, Urine: 1.02 (ref 1.005–1.030)
Urobilinogen, UA: 1 mg/dL (ref 0.0–1.0)

## 2010-04-09 LAB — PROTIME-INR
INR: 1.05 (ref 0.00–1.49)
Prothrombin Time: 13.2 seconds (ref 11.6–15.2)
Prothrombin Time: 13.9 seconds (ref 11.6–15.2)

## 2010-04-09 LAB — URINE MICROSCOPIC-ADD ON

## 2010-04-09 LAB — PREPARE PLATELETS

## 2010-04-09 LAB — BLOOD GAS, ARTERIAL
Acid-Base Excess: 0.1 mmol/L (ref 0.0–2.0)
Bicarbonate: 23.8 mEq/L (ref 20.0–24.0)
Drawn by: 206361
O2 Saturation: 98.6 %
pO2, Arterial: 103 mmHg — ABNORMAL HIGH (ref 80.0–100.0)

## 2010-04-09 LAB — PLATELET COUNT: Platelets: 105 10*3/uL — ABNORMAL LOW (ref 150–400)

## 2010-04-10 ENCOUNTER — Ambulatory Visit (HOSPITAL_COMMUNITY): Payer: Self-pay

## 2010-04-13 ENCOUNTER — Ambulatory Visit (HOSPITAL_COMMUNITY): Payer: Self-pay

## 2010-04-15 ENCOUNTER — Ambulatory Visit (HOSPITAL_COMMUNITY): Payer: Self-pay

## 2010-04-17 ENCOUNTER — Ambulatory Visit (HOSPITAL_COMMUNITY): Payer: Self-pay

## 2010-04-19 ENCOUNTER — Inpatient Hospital Stay (INDEPENDENT_AMBULATORY_CARE_PROVIDER_SITE_OTHER)
Admission: RE | Admit: 2010-04-19 | Discharge: 2010-04-19 | Disposition: A | Discharge status: Home / Self Care | Payer: Medicare Other | Source: Ambulatory Visit | Attending: Emergency Medicine | Admitting: Emergency Medicine

## 2010-04-19 ENCOUNTER — Encounter: Payer: Self-pay | Admitting: Emergency Medicine

## 2010-04-19 DIAGNOSIS — N390 Urinary tract infection, site not specified: Secondary | ICD-10-CM

## 2010-04-19 LAB — CONVERTED CEMR LAB
Glucose, Urine, Semiquant: NEGATIVE
Specific Gravity, Urine: >=1.03
Urobilinogen, UA: 1
pH: 5.5

## 2010-04-20 ENCOUNTER — Ambulatory Visit (HOSPITAL_COMMUNITY): Payer: Self-pay

## 2010-04-22 ENCOUNTER — Telehealth (INDEPENDENT_AMBULATORY_CARE_PROVIDER_SITE_OTHER): Payer: Self-pay | Admitting: *Deleted

## 2010-04-22 ENCOUNTER — Ambulatory Visit (HOSPITAL_COMMUNITY): Payer: Self-pay

## 2010-04-24 ENCOUNTER — Ambulatory Visit (HOSPITAL_COMMUNITY): Payer: Self-pay

## 2010-04-27 ENCOUNTER — Ambulatory Visit (HOSPITAL_COMMUNITY): Payer: Self-pay

## 2010-04-28 ENCOUNTER — Encounter: Payer: Self-pay | Admitting: Family Medicine

## 2010-04-28 NOTE — Assessment & Plan Note (Signed)
Summary: POSS UTI/WSE(rm4)   Vital Signs:  Patient Profile:   76 Years Old Male CC:      urinary frquency/ burning Height:     72 inches Weight:      244 pounds O2 Sat:      98 % O2 treatment:    Room Air Temp:     98.0 degrees F oral Pulse rate:   61 / minute Resp:     20 per minute BP sitting:   112 / 68  (left arm) Cuff size:   regular  Vitals Entered By: [name] RN (April 19, 2010 12:28 PM)                  Updated Prior Medication List: PROTONIX 40 MG  TBEC (PANTOPRAZOLE SODIUM) Take 1 tablet by mouth once a day PRAVASTATIN SODIUM 40 MG  TABS (PRAVASTATIN SODIUM) Take 1 tablet by mouth once a day at bedtime ASPIRIN 81 MG TBEC (ASPIRIN) Take one tablet by mouth daily METOPROLOL TARTRATE 25 MG TABS (METOPROLOL TARTRATE) Take one tablet by mouth twice a day CIALIS 20 MG TABS (TADALAFIL) TAke one about 1-2 hours before sexual intercourse.  Current Allergies (reviewed today): No known allergies History of Present Illness History from: patient & wife Chief Complaint: urinary frquency/ burning History of Present Illness: 76 Years Old Male complains of UTI symptoms for a few weeks.  He describes the pain as burning during urination.  He has not used any OTC meds but did buy [employer] today. + dysuria + frequency +/- urgency No hematuria No penile discharge No fever/chills No lower abdomenal pain No back pain No fatigue  No confusion  REVIEW OF SYSTEMS Constitutional Symptoms      Denies fever, chills, night sweats, weight loss, weight gain, and fatigue.  Eyes       Complains of glasses and eye surgery.      Denies change in vision, eye pain, eye discharge, and contact lenses. Ear/Nose/Throat/Mouth       Complains of change in hearing.      Denies hearing loss/aids, ear pain, ear discharge, dizziness, frequent runny nose, frequent nose bleeds, sinus problems, sore throat, hoarseness, and tooth pain or bleeding.  Respiratory       Denies dry cough,  productive cough, wheezing, shortness of breath, asthma, bronchitis, and emphysema/COPD.  Cardiovascular       Denies murmurs, chest pain, and tires easily with exhertion.    Gastrointestinal       Denies stomach pain, nausea/vomiting, diarrhea, constipation, blood in bowel movements, and indigestion. Genitourniary       Complains of painful urination.      Denies kidney stones and loss of urinary control. Neurological       Denies paralysis, seizures, and fainting/blackouts. Musculoskeletal       Denies muscle pain, joint pain, joint stiffness, decreased range of motion, redness, swelling, muscle weakness, and gout.  Skin       Denies bruising, unusual mles/lumps or sores, and hair/skin or nail changes.  Psych       Complains of speech problems.      Denies mood changes, temper/anger issues, anxiety/stress, depression, and sleep problems. Other Comments: symptoms started months ago but frequency has increased along with burning.   Past History:  Past Medical History: Reviewed history from 08/20/2009 and no changes required. HYPERTENSION  HYPERLIPIDEMIA Aortic stenosis POISON IVY DERMATITIS  Cleft palate  Past Surgical History: Reviewed history from 01/22/2010 and no changes required. Bilat cataract  sx 2004 cholecystectomy 1999 7 surgeries for cleft palate 1938 Oral surgery with bone graft from hip for cleft palate 1989 Right groin hernia repair 1950s Rotator cuff tear, left shoulder.  aortic valve replacement with pericardial tissue valve on October 15, 2009 by Dr. Barry Dienes  Family History: Reviewed history from 12/20/2005 and no changes required. Brother with heart murmur,HTN chol, another brother with MI Father with lung and stomach cancer sister with bone Ca, DM Mother and Father with MI  Social History: Reviewed history from 08/20/2009 and no changes required. 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   non-smoker. Alcohol use-no Drug use-no Physical Exam General appearance: well developed, well nourished, no acute distress Abdomen: soft, non-tender without obvious organomegaly Back: no CVA tenderness MSE: oriented to time, place, and person Assessment New Problems: URINARY TRACT INFECTION (ICD-599.0)   Plan New Medications/Changes: CIPROFLOXACIN HCL 250 MG TABS (CIPROFLOXACIN HCL) 1 by mouth two times a day for 7 days  #14 x 0, 04/19/2010, Hoyt Koch MD  New Orders: UA Dipstick w/o Micro (automated)  [81003] New Patient Level III [99203] Services provided After hours-Weekends-Holidays [99051] T-Culture, Urine [[id number]] Planning Comments:   Hydration, take antibiotic as directed, Urine culture pending Would like him to make appt with Dr. Linford Arnold to discuss need for prostate workup (PSA, ?DRE, etc) Follow-up with your primary care physician if not improving or if getting worse   The patient and/or caregiver has been counseled thoroughly with regard to medications prescribed including dosage, schedule, interactions, rationale for use, and possible side effects and they verbalize understanding.  Diagnoses and expected course of recovery discussed and will return if not improved as expected or if the condition worsens. Patient and/or caregiver verbalized understanding.  Prescriptions: CIPROFLOXACIN HCL 250 MG TABS (CIPROFLOXACIN HCL) 1 by mouth two times a day for 7 days  #14 x 0   Entered and Authorized by:   Hoyt Koch MD   Signed by:   Hoyt Koch MD on 04/19/2010   Method used:   Print then Give to Patient   RxID:   [id number]   Orders Added: 1)  UA Dipstick w/o Micro (automated)  [81003] 2)  New Patient Level III [99203] 3)  Services provided After hours-Weekends-Holidays [99051] 4)  T-Culture, Urine [[id number]]    Laboratory Results   Urine Tests  Date/Time Received: April 19, 2010 12:57 PM  Date/Time Reported: April 19, 2010  12:57 PM   Routine Urinalysis   Color: yellow Appearance: Cloudy Glucose: negative   (Normal Range: Negative) Bilirubin: negative   (Normal Range: Negative) Ketone: negative   (Normal Range: Negative) Spec. Gravity: >=1.030   (Normal Range: 1.003-1.035) Blood: 2+   (Normal Range: Negative) pH: 5.5   (Normal Range: 5.0-8.0) Protein: 2+   (Normal Range: Negative) Urobilinogen: 1.0   (Normal Range: 0-1) Nitrite: negative   (Normal Range: Negative) Leukocyte Esterace: 2+   (Normal Range: Negative)

## 2010-04-28 NOTE — Progress Notes (Signed)
  Phone Note Outgoing Call   Call placed by: Lajean Saver RN,  April 22, 2010 1:10 PM Call placed to: Patient Action Taken: Phone Call Completed Summary of Call: Callback: Patient reports he is taking antibiotics as prescribed. Given urine culture results. Advised to finish antibioitc

## 2010-04-29 ENCOUNTER — Ambulatory Visit (HOSPITAL_COMMUNITY): Payer: Self-pay

## 2010-05-01 ENCOUNTER — Ambulatory Visit (HOSPITAL_COMMUNITY): Payer: Self-pay

## 2010-05-03 ENCOUNTER — Other Ambulatory Visit: Payer: Self-pay | Admitting: Family Medicine

## 2010-05-04 ENCOUNTER — Ambulatory Visit (HOSPITAL_COMMUNITY): Payer: Self-pay

## 2010-05-04 ENCOUNTER — Ambulatory Visit (INDEPENDENT_AMBULATORY_CARE_PROVIDER_SITE_OTHER): Payer: Medicare Other | Admitting: Family Medicine

## 2010-05-04 VITALS — BP 123/75 | HR 58 | Ht 72.0 in | Wt 248.0 lb

## 2010-05-04 DIAGNOSIS — N390 Urinary tract infection, site not specified: Secondary | ICD-10-CM

## 2010-05-04 DIAGNOSIS — N400 Enlarged prostate without lower urinary tract symptoms: Secondary | ICD-10-CM

## 2010-05-04 LAB — POCT URINALYSIS DIPSTICK
Blood, UA: NEGATIVE
Spec Grav, UA: 1.025

## 2010-05-04 MED ORDER — METOPROLOL TARTRATE 25 MG PO TABS: 25.0000 mg | ORAL_TABLET | Freq: Two times a day (BID) | ORAL | Status: DC

## 2010-05-04 MED ORDER — PANTOPRAZOLE SODIUM 40 MG PO TBEC: 40.0000 mg | DELAYED_RELEASE_TABLET | Freq: Every day | ORAL | Status: DC

## 2010-05-04 MED ORDER — PRAVASTATIN SODIUM 40 MG PO TABS: 40.0000 mg | ORAL_TABLET | Freq: Every day | ORAL | Status: DC

## 2010-05-04 NOTE — Progress Notes (Signed)
  Subjective:    Patient ID: [name], male    DOB: [birth year], 76 y.o.   MRN: [id number]  HPI Had had dysuria and urgency  for several weeks.  On 3/25 went to UC and treated with  500mg  bid for one week. Culture was + at [facility].  He says the urgency adn dysria has resolved.  He now only gets up about1- 2 x a night. No hematuria.  No recent catheterization.  Did complete all the abx. No loss of force of stream.No fever. He has been taking [employer] as well. He wonders if he would benefit from a rx medication for his prostate   Review of Systems     Objective:   Physical Exam  Genitourinary: Rectal exam shows external hemorrhoid. Prostate is enlarged. Prostate is not tender.       No nodules.            Assessment & Plan:  UTI - since his sxs improved so quickly I suspect that he really did have a UTI instead of prostatitis Will send a culture to make sure the infection has cleared. Enlarged prostate - AUA score of     Today.  Discussed options of rx medication. Warned of potential SE.  Pt wants to finish his saw palmetto first and then consider a rx.  Proteinuria on UA- If culture is neg then repeat UA in one month to see if still has proteinuria.

## 2010-05-06 ENCOUNTER — Telehealth: Payer: Self-pay | Admitting: Family Medicine

## 2010-05-06 ENCOUNTER — Ambulatory Visit (HOSPITAL_COMMUNITY): Payer: Self-pay

## 2010-05-06 LAB — URINE CULTURE
Colony Count: NO GROWTH
Organism ID, Bacteria: NO GROWTH

## 2010-05-06 NOTE — Telephone Encounter (Signed)
Call pt: urine cx is neg.

## 2010-05-06 NOTE — Telephone Encounter (Signed)
Pt's wife was notified  

## 2010-05-08 ENCOUNTER — Ambulatory Visit (HOSPITAL_COMMUNITY): Payer: Self-pay

## 2010-05-11 ENCOUNTER — Ambulatory Visit (HOSPITAL_COMMUNITY): Payer: Self-pay

## 2010-05-13 ENCOUNTER — Ambulatory Visit (HOSPITAL_COMMUNITY): Payer: Self-pay

## 2010-05-15 ENCOUNTER — Ambulatory Visit (HOSPITAL_COMMUNITY): Payer: Self-pay

## 2010-05-18 ENCOUNTER — Ambulatory Visit (HOSPITAL_COMMUNITY): Payer: Self-pay

## 2010-06-09 NOTE — Assessment & Plan Note (Signed)
OFFICE VISIT   [name], [name]  DOB:  09-16-1934                                        February 09, 2010  CHART #:  [id number]   HISTORY OF PRESENT ILLNESS:  [name] returns for routine followup,  status post aortic valve replacement on October 15, 2009.  He was last  seen here in the office on November 10, 2009.  Since then [name]  has continued to do very well.  He still participating in the cardiac  rehab program and he continues to gradually improve his exercise  tolerance.  He was seen in followup by Dr. Jens Som in December.  He has  been maintaining sinus rhythm and he has now been taken off of both  amiodarone and Coumadin.  Overall, he is getting along very well.  He  denies any shortness of breath.  He is not having any significant chest  pain.  The remainder of his review of systems is unremarkable.   CURRENT MEDICATIONS:  Aspirin, metoprolol, pravastatin, and Protonix   PHYSICAL EXAMINATION:  GENERAL:  Notable for well-appearing male.  VITAL SIGNS:  Blood pressure 148/80, pulse 60, and oxygen saturation  99%.  CHEST:  A median sternotomy scar that is healed completely.  The sternum  is stable on palpation.  Breath sounds are clear to auscultation and  symmetric bilaterally.  No wheezes, rales, or rhonchi are noted.  CARDIOVASCULAR:  Regular rate and rhythm.  No murmurs, rubs, or gallops  are appreciated.  ABDOMEN:  Soft and nontender.  EXTREMITIES:  Warm and well perfused.  There is no lower extremity  edema.   IMPRESSION:  [name] is doing very well, status post aortic valve  replacement using a bioprosthetic tissue valve last September.   PLAN:  In the future, [name] will call or return to see [place] as  needed.  All of his questions have been addressed.   Salvatore Decent. Cornelius [name], M.D.  Electronically Signed   CHO/MEDQ  D:  02/09/2010  T:  02/09/2010  Job:  [id number]   cc:   Madolyn Frieze. Jens Som, MD, [facility]  [name], M.D.

## 2010-06-09 NOTE — Op Note (Signed)
NAME:  [name], [name]             ACCOUNT NO.:  [id number]   MEDICAL RECORD NO.:  [id number]          PATIENT TYPE:  AMB   LOCATION:  [facility]                         FACILITY:  [facility]   PHYSICIAN:  Jene Every, M.D.    DATE OF BIRTH:  Jun 27, 1934   DATE OF PROCEDURE:  09/22/2007  DATE OF DISCHARGE:                               OPERATIVE REPORT   PREOPERATIVE DIAGNOSIS:  Rotator cuff tear, left shoulder.   POSTOPERATIVE DIAGNOSIS:  Rotator cuff tear, left shoulder.   PROCEDURE PERFORMED:  Mini-open rotator cuff repair of the left shoulder  utilizing Arthrex suture anchor and PushLock.   ANESTHESIA:  General.   ASSISTANT:  Roma Schanz, P.A.   BRIEF HISTORY AND INDICATIONS:  This is a 76 year old who has a tear at  the junction of the supraspinatus, infraspinatus with a partial  retraction of that.  He had persistent disabling pain and was indicated  for repair.  Risks and benefits were discussed including bleeding,  infection, damage to vascular structures, suboptimal range of motion,  recurrent tear, need for revision, anesthetic complications, etc.   TECHNIQUE:  With the patient supine in the beach chair position after  the induction of adequate general anesthesia, 2 g of Kefzol, the left  shoulder and upper extremity were prepped and draped in the usual  sterile fashion.  Surgical markings were utilized to delineate the  acromion.  We biased the incision somewhat laterally due to the position  of the tear.  Subcutaneous tissue was dissected and electrocautery was  utilized to achieve hemostasis.  We utilized the raphe between the  anterolateral heads of the deltoid.  It was divided and subperiosteally  elevated from the anterolateral and the anteromedial aspect of the  acromion, preserving the attachment of the deltoid.  The CA ligament was  divided and excised.  Hypertrophic bursa was noted.  It was digitally  lysed and excised.  Noted was a tear of the supraspinatus at  the  junction with the infraspinatus with some retraction of the  infraspinatus.  The insertion was debrided and cortical bed was  prepared.  We mobilized the tendon.  At its juncture we placed a 4-5  suture anchor.  After the utilization of awl and insertion of the  anchor, 2 leaflets towards the supraspinatus and 2 towards the  infraspinatus, tied with a surgeon's knot, crossed, and secured with the  PushLock which was placed over the greater tuberosity after pilot hole  was fashioned with an awl, insertion of the PushLock with excellent  capture of the rotator cuff.  Following this, redundant suture removed,  good range without tension on the repair site, full coverage.  Wound  copiously irrigated, we repaired the raphe with #1 Vicryl interrupted  figure-of-eight sutures, subcu with 2-0 Vicryl simple sutures, skin 4-0  subcuticular Prolene.  Wound reinforced with Steri-Strips.  Sterile  dressing applied.  I placed an abduction pillow, extubated without  difficulty, and transported to the recovery room in satisfactory  condition.   The patient tolerated the procedure well, no complications.  Minimal  blood loss.      Jene Every,  M.D.  Electronically Signed     JB/MEDQ  D:  09/22/2007  T:  09/22/2007  Job:  [id number]

## 2010-06-09 NOTE — Assessment & Plan Note (Signed)
OFFICE VISIT   [name], [name]  DOB:  [birth year]                                        November 10, 2009  CHART #:  [id number]   HISTORY OF PRESENT ILLNESS:  The patient returns for routine followup  status post aortic valve replacement on October 15, 2009, using a  bioprosthetic tissue valve.  His postoperative recovery was notable for  some transient postoperative atrial fibrillation.  He was discharged  home in sinus rhythm on oral amiodarone and Coumadin.  Since hospital  discharge, he has continued to do very well.  He has been seen in  followup by Dr. Jens Som last week.  His dose of amiodarone was cut back  to 200 mg daily.  The patient reports that he is not having any problems  at all.  He has very mild residual soreness in his chest and he is not  taking any sort of pain relievers.  He has not had any significant  shortness of breath.  He denies any tachy palpitations or dizzy spells.  His appetite is good.  He is sleeping well at night.  He has absolutely  no complaints.  The remainder of his review of systems is entirely  unremarkable.   PHYSICAL EXAMINATION:  Notable for well-appearing male with blood  pressure 122/76, pulse 69 and regular, and oxygen saturation 98% on room  air.  Examination of the chest reveals a median sternotomy incision that  is healing nicely.  The sternum is stable.  Auscultation reveals clear  breath sounds that are symmetrical bilaterally.  No wheezes, rales, or  rhonchi noted.  Cardiovascular exam includes regular rate and rhythm.  No murmurs, rubs, or gallops are appreciated.  The abdomen is soft and  nontender.  The extremities are warm and well perfused.  There is no  lower extremity edema.   DIAGNOSTIC TESTS:  Chest x-ray performed today at the [facility] is reviewed.  This demonstrates clear lung fields bilaterally.  All the sternal wires appear intact.  There are no significant  abnormalities.   IMPRESSION:  Excellent progress following aortic valve replacement.   PLAN:  I have encouraged the patient to continue to gradually increase  his physical activity as tolerated with his primary limitations at this  point remaining that he refrain from heavy lifting or strenuous use of  his arms or shoulders for at least another 2 months.  I do think it is  reasonable for him to go ahead and resume driving an automobile.  I have  encouraged him to get involved in the cardiac rehab program.  All of his  questions have been addressed.  We have not made any changes in his  medications.  We will plan to see him back for further followup in 3  months.   Salvatore Decent. Cornelius Moras, M.D.  Electronically Signed   CHO/MEDQ  D:  11/10/2009  T:  11/11/2009  Job:  [id number]   cc:   Madolyn Frieze. Jens Som, MD, [facility]  [name], M.D.

## 2010-06-09 NOTE — Consult Note (Signed)
NEW PATIENT CONSULTATION   [name], [name]  DOB:  1934-12-30                                        October 13, 2009  CHART #:  [id number]   Date of planned hospital admission and surgery will be October 15, 2009.   REASON FOR CONSULTATION:  Severe aortic stenosis.   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  Olga Millers for possible surgical management of severe aortic  stenosis.  The patient was first noted to have a heart murmur on routine  physical exam by his primary care physician, Dr. Linford Arnold, several years  ago.  Echocardiogram performed in 2007, revealed mild-to-moderate aortic  stenosis.  Repeat echocardiogram in June 2010, demonstrated moderate-to-  severe aortic stenosis with significant progression of disease.  Valve  area was notably 1.15 sq. cm at that time.  The patient was referred to  Dr. Olga Millers, and repeat followup echocardiogram was performed on  September 09, 2009.  The patient has had further progression of severity of  disease, now with severe aortic stenosis.  Peak velocity across the  valve measured 445 cm/sec corresponding to peak and mean transvalvular  gradients of 79 and 51 mmHg respectively.  Aortic valve area was  estimated 0.9 sq. cm.  There remains normal left ventricular systolic  function.  The patient underwent a nuclear stress test that revealed  diaphragmatic attenuation with no ischemia with resting ejection  fraction is 63%.  The patient has developed some exertional shortness of  breath and fatigue over the past year.  He was seen in followup by Dr.  Jens Som after his echo and subsequently scheduled for elective left and  right heart catheterization.  This was performed 3 days ago by Dr.  Tonny Bollman.  The patient was found to have normal coronary artery  anatomy with no significant coronary artery disease.   Hemodynamic data  from the catheterization are not currently available.  He is referred  for possible elective aortic valve replacement.   REVIEW OF SYSTEMS:  GENERAL:  The patient reports overall feeling quite  well.  Over the past couple of months, he has started an exercise  program and has actually lost more than 15 pounds in weight.  He is down  from weighing over 250 pounds to 238 pounds this morning.  He is 6 feet  1 inch tall.  CARDIAC:  The patient reports that he gets fatigued and very mildly  short of breath when he pushes himself physically.  He otherwise feels  quite well and he denies any significant problems with progression of  exertional shortness of breath, chest pain, or chest tightness.  The  patient specifically denies any PND, orthopnea, or lower extremity  edema.  He has never had any tachy palpitations, dizzy spells, nor  syncopal episodes.  RESPIRATORY:  Negative.  The patient denies productive cough,  hemoptysis, or wheezing.  GASTROINTESTINAL:  Negative.  The patient is treated medically for  reflux and symptoms have been under good control.  He has no recent  difficulty swallowing.  He has had upper GI endoscopy and esophageal  dilatation several years ago, but no problems for quite some time.  Bowel function is regular and he denies any hematochezia, hematemesis,  or melena.  GENITOURINARY:  Negative.  The patient denies any difficulty starting or  stopping his urinary stream.  He has not had urinary infections.  MUSCULOSKELETAL:  Negative.  The patient denies significant problems  with arthritis or arthralgias.  He does have mild arthralgias in his  hands and he has had some trouble with his left shoulder, but has not  been problematic recently.  NEUROLOGIC:  Negative.  PSYCHIATRIC:  Negative.  HEENT:  Negative.   PAST MEDICAL HISTORY:  1. Aortic stenosis.  2. Hyperlipidemia.  3. Iron-deficient anemia.   PAST SURGICAL HISTORY:  1. Multiple  surgical procedures for repair of cleft palate.  2. Right inguinal hernia repair.  3. Cholecystectomy.  4. Left shoulder surgery.  5. Bilateral cataract extraction.   FAMILY HISTORY:  Multiple family members with coronary artery disease.   SOCIAL HISTORY:  The patient is married and lives with his wife in  [place].  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  He remains active physically  and enjoys camping and taking care of his numerous grandchildren.  He  has a remote history of tobacco use, but he quit smoking in 1979.  He  does not use alcohol regularly.   CURRENT MEDICATIONS:  1. Protonix 40 mg daily.  2. Pravastatin 40 mg daily.  3. Aspirin 325 mg daily.   DRUG ALLERGIES:  None known.   PHYSICAL EXAMINATION:  General:  The patient is a well-appearing male  who appears his stated age, in no acute distress.  Vital Signs:  Blood  pressure 128/75, pulse 62, and oxygen saturation 98% on room air.  HEENT:  Grossly unrevealing.  Neck:  Supple.  There are no carotid  bruits.  There is no palpable lymphadenopathy.  Chest:  Auscultation of  the chest reveals clear breath sounds that are symmetrical bilaterally.  No wheezes, rales, or rhonchi are noted.  Cardiovascular:  Notable for  regular rate and rhythm.  There is a prominent grade 4/6 crescendo-  decrescendo systolic murmur heard all along the precordium, but is most  harsh along the sternal border.  No diastolic murmurs are noted.  Abdomen:  Moderately obese, but soft and nontender.  There are no  palpable masses.  Bowel sounds are present.  Extremities:  Warm and well  perfused.  There is no lower extremity edema.  Distal pulses are  palpable in the posterior tibial position bilaterally.  Rectal and GU:  Both deferred.  Skin:  Clean, dry, and healthy-appearing throughout.  Neurologic:  Grossly nonfocal.   DIAGNOSTIC TESTS:  Transthoracic echocardiogram performed on  September 09, 2009, is reviewed.  This confirms the presence of severe aortic  stenosis.  The aortic valve annulus is normal sized.  The aortic valve  leaflets appear to be tricuspid, but all 3 leaflets are heavily  thickened and calcified with very little mobility.  There is mild aortic  insufficiency.  The aortic root size is normal.  Left ventricular  systolic function is normal.  There is mild left ventricular  hypertrophy.  No other significant abnormalities are noted.   Left heart catheterization performed on October 10, 2009, by Dr.  Tonny Bollman is reviewed.  This demonstrates normal coronary artery  anatomy with no significant coronary artery disease.  There are luminal  irregularities in both left and right coronary system, but no  significant flow-limiting lesions.  There is right dominant coronary  circulation.  The aortic valve is heavily calcified and  leaflets do not  move well.  Right heart catheterization and transvalvular gradient data  are not currently available for review.   IMPRESSION:  Severe aortic stenosis with very mild symptoms of  exertional shortness of breath and fatigue.  I agree that the patient  would benefit from elective aortic valve replacement.  Options include  continued medical therapy with very close followup versus proceeding  with surgery at this juncture.  I have discussed matters at length with  the patient and his wife.  Alternative treatment strategies have been  discussed and all their questions have been addressed.   PLAN:  We plan to proceed with elective aortic valve replacement on  Wednesday, October 15, 2009.  The patient and his wife understand and  accept all potential associated risks of surgery including, but not  limited to risk of death, stroke, myocardial infarction, congestive  heart failure, respiratory failure, renal failure, pneumonia, bleeding  requiring blood transfusion, arrhythmia, heart block with bradycardia   requiring permanent pacemaker, late complications related to valve  replacement.  We have discussed alternatives with respect to elective  replacement of his valve with including use of a mechanical prosthesis  versus a bioprosthetic tissue valve.  The relative risks and benefits of  each approach have been discussed and the patient specifically requests  that we replace his valve using a bioprosthetic tissue valve.  I think  this would be most appropriate under the circumstances.  They have also  asked about minimally invasive approach and we have compared and  contrasted risks and benefits of minimally invasive approach including  either partial upper mini sternotomy or mini thoracotomy incision versus  conventional sternotomy.  After discussing matters, the patient requests  that we use a simple conventional full sternotomy to avoid any potential  added complication.  All their questions have been addressed.   Salvatore Decent. Cornelius Moras, M.D.  Electronically Signed   CHO/MEDQ  D:  10/13/2009  T:  10/14/2009  Job:  [id number]   cc:   Madolyn Frieze. Jens Som, MD, [facility]  [name], M.D.

## 2010-07-08 ENCOUNTER — Other Ambulatory Visit: Payer: Self-pay | Admitting: Family Medicine

## 2010-10-28 ENCOUNTER — Other Ambulatory Visit: Payer: Self-pay | Admitting: Family Medicine

## 2010-11-14 ENCOUNTER — Inpatient Hospital Stay (INDEPENDENT_AMBULATORY_CARE_PROVIDER_SITE_OTHER)
Admission: RE | Admit: 2010-11-14 | Discharge: 2010-11-14 | Disposition: A | Discharge status: Home / Self Care | Payer: Medicare Other | Source: Ambulatory Visit | Attending: Emergency Medicine | Admitting: Emergency Medicine

## 2010-11-14 ENCOUNTER — Encounter: Payer: Self-pay | Admitting: Emergency Medicine

## 2010-11-14 DIAGNOSIS — R300 Dysuria: Secondary | ICD-10-CM

## 2010-11-14 DIAGNOSIS — J069 Acute upper respiratory infection, unspecified: Secondary | ICD-10-CM

## 2010-11-14 LAB — CONVERTED CEMR LAB
Glucose, Urine, Semiquant: NEGATIVE
Nitrite: POSITIVE
Specific Gravity, Urine: 1.025
Urobilinogen, UA: 2

## 2010-11-16 ENCOUNTER — Telehealth (INDEPENDENT_AMBULATORY_CARE_PROVIDER_SITE_OTHER): Payer: Self-pay | Admitting: Emergency Medicine

## 2010-12-28 NOTE — Telephone Encounter (Signed)
  Phone Note Outgoing Call Call back at [facility] Phone [phone]   Call placed by: Emilio Math,  November 16, 2010 1:52 PM Call placed to: Patient Summary of Call: Spoke to patient he is feeling better told him lab results and advised him to finish all meds.

## 2010-12-28 NOTE — Progress Notes (Signed)
Summary: coldandUTI?/TM   Vital Signs:  Patient Profile:   76 Years Old Male CC:      cold/UTI Height:     72 inches Weight:      260.50 pounds O2 Sat:      99 % Temp:     97.6 degrees F oral Pulse rate:   56 / minute Resp:     20 per minute BP sitting:   138 / 73  (left arm) Cuff size:   large  Vitals Entered By: [name] RN (November 14, 2010 9:55 AM)                  Updated Prior Medication List: PROTONIX 40 MG  TBEC (PANTOPRAZOLE SODIUM) Take 1 tablet by mouth once a day PRAVASTATIN SODIUM 40 MG  TABS (PRAVASTATIN SODIUM) Take 1 tablet by mouth once a day at bedtime ASPIRIN 81 MG TBEC (ASPIRIN) Take one tablet by mouth daily METOPROLOL TARTRATE 25 MG TABS (METOPROLOL TARTRATE) Take one tablet by mouth twice a day CIALIS 20 MG TABS (TADALAFIL) TAke one about 1-2 hours before sexual intercourse. CIPROFLOXACIN HCL 250 MG TABS (CIPROFLOXACIN HCL) 1 by mouth two times a day for 7 days CIPROFLOXACIN HCL 500 MG TABS (CIPROFLOXACIN HCL) 1 by mouth two times a day for 7 days  Current Allergies: No known allergies History of Present Illness History from: patient & wife Chief Complaint: cold/UTI History of Present Illness: 55) 76 Years Old Male complains of onset of cold symptoms for 6 days.  [name] has been using Corecidin which is helping a little bit. No sore throat + cough No pleuritic pain No wheezing + nasal congestion + post-nasal drainage + sinus pain/pressure No chest congestion No itchy/red eyes No earache No hemoptysis No SOB No chills/sweats No fever No nausea No vomiting No abdominal pain No diarrhea No skin rashes + fatigue No myalgias No headache   50) 76 Years Old Male complains of UTI symptoms for 2 days.  Describes the pain as burning during urination. Has not used any OTC meds. No dysuria ++ frequency No urgency No hematuria ? fever/chills No lower abdomenal pain No back pain + fatigue   REVIEW OF SYSTEMS Constitutional  Symptoms       Complains of fever and fatigue.     Denies chills, night sweats, weight loss, and weight gain.  Eyes       Complains of glasses and eye surgery.      Denies eye pain, eye discharge, and contact lenses. Ear/Nose/Throat/Mouth       Complains of change in hearing, frequent runny nose, sore throat, and hoarseness.      Denies hearing loss/aids, ear pain, ear discharge, dizziness, frequent nose bleeds, sinus problems, and tooth pain or bleeding.  Respiratory       Complains of dry cough, wheezing, and shortness of breath.      Denies productive cough, asthma, bronchitis, and emphysema/COPD.  Cardiovascular       Complains of chest pain.      Denies murmurs and tires easily with exhertion.    Gastrointestinal       Denies stomach pain, nausea/vomiting, diarrhea, constipation, blood in bowel movements, and indigestion. Genitourniary       Denies painful urination, kidney stones, and loss of urinary control. Neurological       Complains of headaches.      Denies paralysis, seizures, and fainting/blackouts. Musculoskeletal       Denies muscle pain, joint pain, joint stiffness, decreased  range of motion, redness, swelling, muscle weakness, and gout.  Skin       Denies bruising, unusual mles/lumps or sores, and hair/skin or nail changes.  Psych       Denies mood changes, temper/anger issues, anxiety/stress, speech problems, depression, and sleep problems. Other Comments: coldx 1 week, urinary problems, 1-2 days   Past History:  Past Medical History: Reviewed history from 08/20/2009 and no changes required. HYPERTENSION  HYPERLIPIDEMIA Aortic stenosis POISON IVY DERMATITIS  Cleft palate  Past Surgical History: Reviewed history from 01/22/2010 and no changes required. Bilat cataract sx 2004 cholecystectomy 1999 7 surgeries for cleft palate 1938 Oral surgery with bone graft from hip for cleft palate 1989 Right groin hernia repair 1950s Rotator cuff tear, left shoulder.    aortic valve replacement with pericardial tissue valve on October 15, 2009 by Dr. Barry Dienes  Family History: Reviewed history from 12/20/2005 and no changes required. Brother with heart murmur,HTN chol, another brother with MI Father with lung and stomach cancer sister with bone Ca, DM Mother and Father with MI  Social History: Reviewed history from 08/20/2009 and no changes required. 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  non-smoker. Alcohol use-no Drug use-no Physical Exam General appearance: well developed, well nourished, no acute distress Ears: normal, no lesions or deformities Nasal: mucosa pink, nonedematous, no septal deviation, turbinates normal Oral/Pharynx: tongue normal, posterior pharynx without erythema or exudate Chest/Lungs: no rales, wheezes, or rhonchi bilateral, breath sounds equal without effort Heart: regular rate and  rhythm, no murmur Abdomen: soft, non-tender without obvious organomegaly Back: No flank or CVA tenderness MSE: oriented to time, place, and person Assessment New Problems: DYSURIA (ICD-788.1) UPPER RESPIRATORY INFECTION, ACUTE (ICD-465.9)   Patient Education: Patient and/or caregiver instructed in the following: rest, fluids.  Plan New Medications/Changes: CIPROFLOXACIN HCL 500 MG TABS (CIPROFLOXACIN HCL) 1 by mouth two times a day for 7 days  #14 x 0, 11/14/2010, Hoyt Koch MD  New Orders: Est. Patient Level IV [04540] UA Dipstick w/o Micro (automated)  [81003] T-Culture, Urine [[id number]] Planning Comments:   1)  Take the prescribed antibiotic as instructed. 2)  Use nasal saline solution (over the counter) at least 3 times a day. 3)  Use over the counter decongestants like Zyrtec-D every 12 hours as needed to help with congestion. 4)  Can take tylenol every 6 hours or motrin every 8 hours for pain or fever. 5)  Follow up with your primary doctor  if no improvement in 5-7  days, sooner if increasing pain, fever, or new symptoms.    Urine culture pending.  Follow-up with your primary care physician if not improving or if getting worse   May want to speak more to Dr. Linford Arnold about prostate issues.   The patient and/or caregiver has been counseled thoroughly with regard to medications prescribed including dosage, schedule, interactions, rationale for use, and possible side effects and they verbalize understanding.  Diagnoses and expected course of recovery discussed and will return if not improved as expected or if the condition worsens. Patient and/or caregiver verbalized understanding.  Prescriptions: CIPROFLOXACIN HCL 500 MG TABS (CIPROFLOXACIN HCL) 1 by mouth two times a day for 7 days  #14 x 0   Entered and Authorized by:   Hoyt Koch MD   Signed by:   Hoyt Koch MD on 11/14/2010   Method used:   Print then Give to Patient   RxID:   [id number]   Orders Added: 1)  Est. Patient Level IV [96295] 2)  UA Dipstick w/o Micro (automated)  [81003] 3)  T-Culture, Urine [[id number]]    Laboratory Results   Urine Tests  Date/Time Received: November 14, 2010 10:05 AM  Date/Time Reported: November 14, 2010 10:05 AM   Routine Urinalysis   Color: yellow Appearance: Cloudy Glucose: negative   (Normal Range: Negative) Bilirubin: negative   (Normal Range: Negative) Ketone: trace (5)   (Normal Range: Negative) Spec. Gravity: 1.025   (Normal Range: 1.003-1.035) Blood: 2+   (Normal Range: Negative) pH: 6.0   (Normal Range: 5.0-8.0) Protein: 2+   (Normal Range: Negative) Urobilinogen: 2.0   (Normal Range: 0-1) Nitrite: positive   (Normal Range: Negative) Leukocyte Esterace: 3+   (Normal Range: Negative)

## 2011-01-25 ENCOUNTER — Encounter: Payer: Self-pay | Admitting: Family Medicine

## 2011-01-25 ENCOUNTER — Ambulatory Visit (INDEPENDENT_AMBULATORY_CARE_PROVIDER_SITE_OTHER): Payer: Medicare Other | Admitting: Family Medicine

## 2011-01-25 VITALS — BP 160/81 | HR 59 | Temp 97.8°F | Ht 73.0 in | Wt 260.0 lb

## 2011-01-25 DIAGNOSIS — R030 Elevated blood-pressure reading, without diagnosis of hypertension: Secondary | ICD-10-CM

## 2011-01-25 DIAGNOSIS — IMO0002 Reserved for concepts with insufficient information to code with codable children: Secondary | ICD-10-CM

## 2011-01-25 DIAGNOSIS — Z23 Encounter for immunization: Secondary | ICD-10-CM

## 2011-01-25 DIAGNOSIS — L089 Local infection of the skin and subcutaneous tissue, unspecified: Secondary | ICD-10-CM

## 2011-01-25 DIAGNOSIS — H9190 Unspecified hearing loss, unspecified ear: Secondary | ICD-10-CM

## 2011-01-25 DIAGNOSIS — H9193 Unspecified hearing loss, bilateral: Secondary | ICD-10-CM | POA: Insufficient documentation

## 2011-01-25 MED ORDER — DOXYCYCLINE HYCLATE 100 MG PO TABS: 100.0000 mg | ORAL_TABLET | Freq: Two times a day (BID) | ORAL | Status: AC

## 2011-01-25 MED ORDER — TETANUS-DIPHTH-ACELL PERTUSSIS 5-2.5-18.5 LF-MCG/0.5 IM SUSP: 0.5000 mL | Freq: Once | INTRAMUSCULAR | Status: DC

## 2011-01-25 MED ORDER — MUPIROCIN 2 % EX OINT: TOPICAL_OINTMENT | Freq: Three times a day (TID) | CUTANEOUS | Status: AC

## 2011-01-25 NOTE — Patient Instructions (Signed)
Hearing Loss A hearing loss is sometimes called deafness. Hearing loss may be partial or total. CAUSES Hearing loss may be caused by:  Wax in the ear canal.   Infection of the ear canal.   Infection of the middle ear.   Trauma to the ear or surrounding area.   Fluid in the middle ear.   A hole in the eardrum (perforated eardrum).   Exposure to loud sounds or music.   Problems with the hearing nerve.   Certain medications.  Hearing loss without wax, infection, or a history of injury may mean that the nerve is involved. Hearing loss with severe dizziness, nausea and vomiting or ringing in the ear may suggest a hearing nerve irritation or problems in the middle or inner ear. If hearing loss is untreated, there is a greater likelihood for residual or permanent hearing loss. DIAGNOSIS A hearing test (audiometry) assesses hearing loss. The audiometry test needs to be performed by a hearing specialist (audiologist). TREATMENT Treatment for recent onset of hearing loss may include:  Ear wax removal.   Medications that kill germs (antibiotics).   Cortisone medications.   Prompt follow up with the appropriate specialist.  Return of hearing depends on the cause of your hearing loss, so proper medical follow-up is important. Some hearing loss may not be reversible, and a caregiver should discuss care and treatment options with you. SEEK MEDICAL CARE IF:   You have a severe headache, dizziness, or changes in vision.   You have new or increased weakness.   You develop repeated vomiting or other serious medical problems.   You have a fever.  Document Released: 01/11/2005 Document Revised: 09/23/2010 Document Reviewed: 05/08/2009 [facility] Patient Information 2012 [place], [place].Insect Bite Mosquitoes, flies, fleas, bedbugs, and many other insects can bite. Insect bites are different from insect stings. A sting is when venom is injected into the skin. Some insect bites can transmit  infectious diseases. SYMPTOMS  Insect bites usually turn red, swell, and itch for 2 to 4 days. They often go away on their own. TREATMENT  Your caregiver may prescribe antibiotic medicines if a bacterial infection develops in the bite. HOME CARE INSTRUCTIONS  Do not scratch the bite area.   Keep the bite area clean and dry. Wash the bite area thoroughly with soap and water.   Put ice or cool compresses on the bite area.   Put ice in a plastic bag.   Place a towel between your skin and the bag.   Leave the ice on for 20 minutes, 4 times a day for the first 2 to 3 days, or as directed.   You may apply a baking soda paste, cortisone cream, or calamine lotion to the bite area as directed by your caregiver. This can help reduce itching and swelling.   Only take over-the-counter or prescription medicines as directed by your caregiver.   If you are given antibiotics, take them as directed. Finish them even if you start to feel better.  You may need a tetanus shot if:  You cannot remember when you had your last tetanus shot.   You have never had a tetanus shot.   The injury broke your skin.  If you get a tetanus shot, your arm may swell, get red, and feel warm to the touch. This is common and not a problem. If you need a tetanus shot and you choose not to have one, there is a rare chance of getting tetanus. Sickness from tetanus can be  serious. SEEK IMMEDIATE MEDICAL CARE IF:   You have increased pain, redness, or swelling in the bite area.   You see a red line on the skin coming from the bite.   You have a fever.   You have joint pain.   You have a headache or neck pain.   You have unusual weakness.   You have a rash.   You have chest pain or shortness of breath.   You have abdominal pain, nausea, or vomiting.   You feel unusually tired or sleepy.  Document Released: 02/19/2004 Document Revised: 09/23/2010 Document Reviewed: 08/12/2010 [facility] Patient Information  2012 [place], [place].

## 2011-01-25 NOTE — Progress Notes (Signed)
  Subjective:    Patient ID: [name], male    DOB: 1934-05-01, 76 y.o.   MRN: [id number]  HPI According to him and his wife about a week ago he noticed a lesion on his left upper arm. He believed it wasn't spider bite at that time. There was a head to the initial bite she squeezed it and got discharged from. States that it felt better initially but now it has  gotten redder and feels thicker. He denies any fever chills or nausea. He had a flu shot in October and pneumonia vaccination in in [place] last year before he had open-heart surgery but cannot remember when the last tetanus he had excellent he was given to him at [facility] and his wife reports that was definitely more than 10 years ago.   Review of Systems  HENT: Positive for hearing loss.        Wife reports progressive and marked hearing loss in both ears  Skin: Positive for color change and rash.   BP 160/81  Pulse 59  Temp(Src) 97.8 F (36.6 C) (Oral)  Ht 6\' 1"  (1.854 m)  Wt 260 lb (117.935 kg)  BMI 34.30 kg/m2  SpO2 97%    Objective:   Physical Exam  Constitutional: He is oriented to person, place, and time. He appears well-developed and well-nourished.  HENT:  Head: Normocephalic.  Right Ear: External ear normal.  Left Ear: External ear normal.       minimal wax present in both ears  Neck: Normal range of motion. Neck supple.  Musculoskeletal: He exhibits edema and tenderness.  Neurological: He is alert and oriented to person, place, and time.  Skin: Skin is dry. There is erythema.       L arm proximal area there is a hyperemic area . There appears to have been a bite with increase thickness and tenderness of the area.  Psychiatric: He has a normal mood and affect. His behavior is normal.          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

## 2011-01-30 ENCOUNTER — Other Ambulatory Visit: Payer: Self-pay | Admitting: Family Medicine

## 2011-02-25 ENCOUNTER — Ambulatory Visit (INDEPENDENT_AMBULATORY_CARE_PROVIDER_SITE_OTHER): Payer: Medicare Other | Admitting: Family Medicine

## 2011-02-25 VITALS — BP 161/92 | HR 77

## 2011-02-25 DIAGNOSIS — R300 Dysuria: Secondary | ICD-10-CM

## 2011-02-25 DIAGNOSIS — R309 Painful micturition, unspecified: Secondary | ICD-10-CM

## 2011-02-25 LAB — POCT URINALYSIS DIPSTICK
Protein, UA: >=300
Urobilinogen, UA: 1

## 2011-02-25 MED ORDER — SULFAMETHOXAZOLE-TRIMETHOPRIM 800-160 MG PO TABS: 1.0000 | ORAL_TABLET | Freq: Two times a day (BID) | ORAL | Status: AC

## 2011-02-25 NOTE — Progress Notes (Signed)
  Subjective:    Patient ID: [name], male    DOB: May 19, 1934, 77 y.o.   MRN: [id number]  HPI    Review of Systems     Objective:   Physical Exam        Assessment & Plan:  Urinary tract infection-urinalysis is positive. I will go ahead and send them a prescription for Bactrim twice a day x14 days. We will send a culture since he is a male.

## 2011-02-25 NOTE — Progress Notes (Signed)
  Subjective:    Patient ID: [name], male    DOB: [birth year], 77 y.o.   MRN: [id number] U/A; burning and frequency HPI    Review of Systems     Objective:   Physical Exam        Assessment & Plan:

## 2011-02-26 NOTE — Progress Notes (Signed)
Pt informed

## 2011-03-08 DIAGNOSIS — H903 Sensorineural hearing loss, bilateral: Secondary | ICD-10-CM | POA: Diagnosis not present

## 2011-04-16 ENCOUNTER — Other Ambulatory Visit: Payer: Self-pay | Admitting: Family Medicine

## 2011-04-20 ENCOUNTER — Encounter: Payer: Self-pay | Admitting: Cardiology

## 2011-04-20 ENCOUNTER — Ambulatory Visit (INDEPENDENT_AMBULATORY_CARE_PROVIDER_SITE_OTHER): Payer: Medicare Other | Admitting: Cardiology

## 2011-04-20 VITALS — BP 125/78 | HR 66 | Ht 73.0 in | Wt 266.0 lb

## 2011-04-20 DIAGNOSIS — E785 Hyperlipidemia, unspecified: Secondary | ICD-10-CM

## 2011-04-20 DIAGNOSIS — I359 Nonrheumatic aortic valve disorder, unspecified: Secondary | ICD-10-CM | POA: Diagnosis not present

## 2011-04-20 DIAGNOSIS — Z954 Presence of other heart-valve replacement: Secondary | ICD-10-CM

## 2011-04-20 DIAGNOSIS — Z952 Presence of prosthetic heart valve: Secondary | ICD-10-CM

## 2011-04-20 DIAGNOSIS — I4891 Unspecified atrial fibrillation: Secondary | ICD-10-CM | POA: Diagnosis not present

## 2011-04-20 NOTE — Assessment & Plan Note (Signed)
Patient doing well with history of aortic valve replacement. Continue SBE prophylaxis. Repeat echocardiogram.

## 2011-04-20 NOTE — Progress Notes (Signed)
HPI: Pleasant male I saw in July of 2011 for evaluation of aortic stenosis. Echocardiogram was performed in August for 2011 and  revealed normal LV function, mild left ventricular hypertrophy and grade 1 diastolic dysfunction. There was severe aortic stenosis with  mean gradient through the valve of 51 mm Hg. There was mild aortic insufficiency and mitral regurgitation and mild biatrial enlargement. Cardiac catheterization in Sept 2011 revealed nonobstructive coronary disease, normal LV function and moderately severe aortic stenosis with a mean gradient of 34 mm of mercury. The patient then underwent aortic valve replacement on September 21 of 2011 with a pericardial tissue valve. Postoperative course complicated by atrial fibrillation and treated with amiodarone. Last echocardiogram performed in October 2011 revealed normal LV function and a normally functioning aortic valve with a mean gradient of 14 mm of mercury. I last saw him in Dec of 2011. Since then, the patient has dyspnea with more extreme activities but not with routine activities. It is relieved with rest. It is not associated with chest pain. There is no orthopnea, PND or pedal edema. There is no syncope or palpitations. There is no exertional chest pain.   Current Outpatient Prescriptions  Medication Sig Dispense Refill  . aspirin 81 MG tablet Take 81 mg by mouth daily.        . metoprolol tartrate (LOPRESSOR) 25 MG tablet Take 1 tablet (25 mg total) by mouth 2 (two) times daily.  180 tablet  3  . pantoprazole (PROTONIX) 40 MG tablet TAKE 1 TABLET BY MOUTH EVERY DAY  90 tablet  2  . pravastatin (PRAVACHOL) 40 MG tablet Take 1 tablet (40 mg total) by mouth daily.  90 tablet  3   Current Facility-Administered Medications  Medication Dose Route Frequency Provider Last Rate Last Dose  . TDaP (BOOSTRIX) injection 0.5 mL  0.5 mL Intramuscular Once Hassan Rowan, MD         Past Medical History  Diagnosis Date  . Hypertension   .  Hyperlipidemia   . Aortic stenosis   . Dermatitis     poison ivy  . Cleft palate     Past Surgical History  Procedure Date  . Bilat cataract sx 2004  . Cholecystectomy 1999  . 7 surgeries for cleft palate 1938  . Oral surgery with bone graft from hip for cleft palate 1989  . Right groin hernia repair 1950's  . Rotator cuff tear, left shoulder   . Aortic valve replacement w/ pericardial tissue valve 10-15-09    Dr Barry Dienes    History   Social History  . Marital Status: Married    Spouse Name: N/A    Number of Children: N/A  . Years of Education: N/A   Occupational History  . Not on file.   Social History Main Topics  . Smoking status: Former Games developer  . Smokeless tobacco: Not on file  . Alcohol Use: No  . Drug Use: No  . Sexually Active: Not on file   Other Topics Concern  . Not on file   Social History Narrative  . No narrative on file    ROS: no fevers or chills, productive cough, hemoptysis, dysphasia, odynophagia, melena, hematochezia, dysuria, hematuria, rash, seizure activity, orthopnea, PND, pedal edema, claudication. Remaining systems are negative.  Physical Exam: Well-developed well-nourished in no acute distress.  Skin is warm and dry.  HEENT is normal.  Neck is supple. No thyromegaly.  Chest is clear to auscultation with normal expansion.  Cardiovascular exam is regular rate  and rhythm. 2/6 systolic murmur LSB; no DM Abdominal exam nontender or distended. No masses palpated. Extremities show no edema. neuro grossly intact  ECG rate 66 first degree AV block. Right axis deviation. Nonspecific ST T wave changes.

## 2011-04-20 NOTE — Assessment & Plan Note (Signed)
Continue statin. Lipids and liver monitored by primary care. 

## 2011-04-20 NOTE — Patient Instructions (Signed)
Your physician wants you to follow-up in: ONE YEAR IN [place] You will receive a reminder letter in the mail two months in advance. If you don't receive a letter, please call our office to schedule the follow-up appointment.   Your physician has requested that you have an echocardiogram. Echocardiography is a painless test that uses sound waves to create images of your heart. It provides your doctor with information about the size and shape of your heart and how well your heart's chambers and valves are working. This procedure takes approximately one hour. There are no restrictions for this procedure.

## 2011-04-28 ENCOUNTER — Ambulatory Visit (HOSPITAL_COMMUNITY): Payer: Medicare Other | Attending: Cardiology

## 2011-04-28 ENCOUNTER — Other Ambulatory Visit: Payer: Self-pay

## 2011-04-28 DIAGNOSIS — I059 Rheumatic mitral valve disease, unspecified: Secondary | ICD-10-CM | POA: Diagnosis not present

## 2011-04-28 DIAGNOSIS — I10 Essential (primary) hypertension: Secondary | ICD-10-CM | POA: Insufficient documentation

## 2011-04-28 DIAGNOSIS — I379 Nonrheumatic pulmonary valve disorder, unspecified: Secondary | ICD-10-CM | POA: Diagnosis not present

## 2011-04-28 DIAGNOSIS — Z952 Presence of prosthetic heart valve: Secondary | ICD-10-CM

## 2011-04-28 DIAGNOSIS — E785 Hyperlipidemia, unspecified: Secondary | ICD-10-CM | POA: Insufficient documentation

## 2011-04-28 DIAGNOSIS — I359 Nonrheumatic aortic valve disorder, unspecified: Secondary | ICD-10-CM | POA: Diagnosis not present

## 2011-04-28 DIAGNOSIS — I079 Rheumatic tricuspid valve disease, unspecified: Secondary | ICD-10-CM | POA: Insufficient documentation

## 2011-04-28 DIAGNOSIS — E669 Obesity, unspecified: Secondary | ICD-10-CM | POA: Insufficient documentation

## 2011-04-30 ENCOUNTER — Telehealth: Payer: Self-pay | Admitting: Cardiology

## 2011-04-30 NOTE — Telephone Encounter (Signed)
Spoke with pt wife, aware of echo results.  

## 2011-04-30 NOTE — Telephone Encounter (Signed)
FU Call: Pt wife returning call to our office from yesterday. Please return pt wife call to discuss further.

## 2011-05-01 ENCOUNTER — Other Ambulatory Visit: Payer: Self-pay | Admitting: Family Medicine

## 2011-07-24 ENCOUNTER — Other Ambulatory Visit: Payer: Self-pay | Admitting: Family Medicine

## 2011-10-25 ENCOUNTER — Other Ambulatory Visit: Payer: Self-pay | Admitting: Family Medicine

## 2011-11-08 ENCOUNTER — Other Ambulatory Visit: Payer: Self-pay | Admitting: Family Medicine

## 2011-11-25 DIAGNOSIS — Z23 Encounter for immunization: Secondary | ICD-10-CM | POA: Diagnosis not present

## 2011-12-14 ENCOUNTER — Ambulatory Visit (INDEPENDENT_AMBULATORY_CARE_PROVIDER_SITE_OTHER): Payer: Medicare Other | Admitting: Sports Medicine

## 2011-12-14 ENCOUNTER — Encounter: Payer: Self-pay | Admitting: Sports Medicine

## 2011-12-14 VITALS — BP 122/67 | HR 62 | Temp 98.2°F | Wt 269.0 lb

## 2011-12-14 DIAGNOSIS — H1045 Other chronic allergic conjunctivitis: Secondary | ICD-10-CM | POA: Diagnosis not present

## 2011-12-14 DIAGNOSIS — H1010 Acute atopic conjunctivitis, unspecified eye: Secondary | ICD-10-CM | POA: Insufficient documentation

## 2011-12-14 MED ORDER — OLOPATADINE HCL 0.1 % OP SOLN: 1.0000 [drp] | Freq: Two times a day (BID) | OPHTHALMIC | Status: DC

## 2011-12-14 NOTE — Assessment & Plan Note (Signed)
Right eye predominant, however seems to be chronic. It is occurred in the other eye as well. Patanol eyedrops. Return to clinic on an as-needed basis, in one week.

## 2011-12-14 NOTE — Progress Notes (Signed)
Subjective:    CC: Right eye itching  HPI: [name] is a very pleasant 43 her male who comes in with an on-and-off several month history of itching and watery discharge of his right, and sometimes left eyes. Vision is unchanged, there is no pain, he denies a sandpaper or gritty-like sensation, denies sick contacts. Denies concurrent URI symptoms. Denies trauma. Has been using some Allegra over-the-counter, which has not been effective. No headaches.  Past medical history, Surgical history, Family history, Social history, Allergies, and medications have been entered into the medical record, reviewed, and no changes needed.   Review of Systems: No fevers, chills, night sweats, weight loss, chest pain, or shortness of breath.   Objective:    General: Well Developed, well nourished, and in no acute distress.  Neuro: Alert and oriented x3, extra-ocular muscles intact.  HEENT: Normocephalic, atraumatic, pupils equal round reactive to light, neck supple, no masses, no lymphadenopathy, thyroid nonpalpable. Right eye is mildly injected, extraocular muscles intact, vision grossly intact. Skin: Warm and dry, no rashes. Cardiac: Regular rate and rhythm, no murmurs rubs or gallops.  Respiratory: Clear to auscultation bilaterally. Not using accessory muscles, speaking in full sentences.  Impression and Recommendations:

## 2011-12-14 NOTE — Patient Instructions (Addendum)

## 2011-12-15 ENCOUNTER — Other Ambulatory Visit: Payer: Self-pay | Admitting: Family Medicine

## 2012-01-23 ENCOUNTER — Other Ambulatory Visit: Payer: Self-pay | Admitting: Family Medicine

## 2012-02-28 ENCOUNTER — Other Ambulatory Visit: Payer: Self-pay | Admitting: Family Medicine

## 2012-02-29 ENCOUNTER — Other Ambulatory Visit: Payer: Self-pay | Admitting: Family Medicine

## 2012-03-27 ENCOUNTER — Other Ambulatory Visit: Payer: Self-pay | Admitting: Family Medicine

## 2012-04-21 DIAGNOSIS — M25519 Pain in unspecified shoulder: Secondary | ICD-10-CM | POA: Diagnosis not present

## 2012-04-25 DIAGNOSIS — M25519 Pain in unspecified shoulder: Secondary | ICD-10-CM | POA: Diagnosis not present

## 2012-04-25 DIAGNOSIS — M67919 Unspecified disorder of synovium and tendon, unspecified shoulder: Secondary | ICD-10-CM | POA: Diagnosis not present

## 2012-04-25 DIAGNOSIS — M719 Bursopathy, unspecified: Secondary | ICD-10-CM | POA: Diagnosis not present

## 2012-04-26 ENCOUNTER — Other Ambulatory Visit: Payer: Self-pay | Admitting: Family Medicine

## 2012-05-19 ENCOUNTER — Other Ambulatory Visit: Payer: Self-pay | Admitting: Family Medicine

## 2012-05-19 NOTE — Telephone Encounter (Signed)
Needs appt

## 2012-05-24 ENCOUNTER — Telehealth: Payer: Self-pay | Admitting: Cardiology

## 2012-05-24 NOTE — Telephone Encounter (Signed)
Would fu with primary care physician for this first; if felt to be cardiac, I would be happy to see. Olga Millers

## 2012-05-24 NOTE — Telephone Encounter (Signed)
New Problem:    Patient called in because her husband is experiencing swelling in his left arm and would like to know if this is something she should be concerned about.  Please call back.

## 2012-05-24 NOTE — Telephone Encounter (Signed)
Spoke with pt wife, she has noticed for the last 2-3 days he has swelling in his left arm. He is unable to wear his watch. His feet and legs are fine with no edema. He has off and on SOB and is sleeping fine on several pillows. He does c/o discomfort around the wrist and hand area. He does not havbe any bug bites or injuries to that arm. They have a follow up later in may but wanted to know what to do about his arm. Will forward for dr Jens Som review

## 2012-05-24 NOTE — Telephone Encounter (Signed)
Spoke with pt wife, Aware of dr crenshaw's recommendations.  

## 2012-05-26 ENCOUNTER — Other Ambulatory Visit: Payer: Self-pay | Admitting: Family Medicine

## 2012-05-29 ENCOUNTER — Ambulatory Visit (INDEPENDENT_AMBULATORY_CARE_PROVIDER_SITE_OTHER): Payer: Medicare Other

## 2012-05-29 ENCOUNTER — Encounter: Payer: Self-pay | Admitting: Family Medicine

## 2012-05-29 ENCOUNTER — Ambulatory Visit (INDEPENDENT_AMBULATORY_CARE_PROVIDER_SITE_OTHER): Payer: Medicare Other | Admitting: Family Medicine

## 2012-05-29 VITALS — BP 113/65 | HR 65 | Wt 259.0 lb

## 2012-05-29 DIAGNOSIS — J810 Acute pulmonary edema: Secondary | ICD-10-CM

## 2012-05-29 DIAGNOSIS — R609 Edema, unspecified: Secondary | ICD-10-CM | POA: Diagnosis not present

## 2012-05-29 DIAGNOSIS — R600 Localized edema: Secondary | ICD-10-CM

## 2012-05-29 DIAGNOSIS — R918 Other nonspecific abnormal finding of lung field: Secondary | ICD-10-CM

## 2012-05-29 DIAGNOSIS — R0989 Other specified symptoms and signs involving the circulatory and respiratory systems: Secondary | ICD-10-CM

## 2012-05-29 DIAGNOSIS — J189 Pneumonia, unspecified organism: Secondary | ICD-10-CM | POA: Diagnosis not present

## 2012-05-29 LAB — COMPLETE METABOLIC PANEL WITH GFR
Albumin: 4 g/dL (ref 3.5–5.2)
Alkaline Phosphatase: 59 U/L (ref 39–117)
CO2: 23 mEq/L (ref 19–32)
Chloride: 104 mEq/L (ref 96–112)
GFR, Est Non African American: 58 mL/min — ABNORMAL LOW
Glucose, Bld: 98 mg/dL (ref 70–99)
Potassium: 4.5 mEq/L (ref 3.5–5.3)
Sodium: 137 mEq/L (ref 135–145)
Total Protein: 7 g/dL (ref 6.0–8.3)

## 2012-05-29 MED ORDER — FUROSEMIDE 20 MG PO TABS: 20.0000 mg | ORAL_TABLET | Freq: Two times a day (BID) | ORAL | Status: DC

## 2012-05-29 NOTE — Progress Notes (Signed)
CC: [name] is a 78 y.o. male is here for Edema   Subjective: HPI:  Patient presents for concerns of bilateral upper extremity edema. This has been present for a little less than a week. Came on gradually not getting better or worse since the weekend. He describes it as mild/moderate accompanied with a mild irritation feeling at the sites of swelling. He's unsure what precipitated this, no recent changes in medication or diet.  Symptoms are present all hours of the day nothing makes better or worse. He denies personal history of blood clots. He admits that he add salt most meals and has been taking 2 Aleve every night for the past few weeks for shoulder pain. Denies fevers, chills, orthopnea, shortness of breath, lower edema, weeping skin, chest pain, irregular heartbeat, abdominal pain or bloating.    Review Of Systems Outlined In HPI  Past Medical History  Diagnosis Date  . Hypertension   . Hyperlipidemia   . Aortic stenosis   . Dermatitis     poison ivy  . Cleft palate      Family History  Problem Relation Age of Onset  . Heart attack Mother   . Cancer Father     lung and stomach  . Heart attack Father   . Cancer Sister     bone  . Diabetes Sister   . Heart murmur Brother   . Hypertension Brother   . Hyperlipidemia Brother   . Heart attack Brother      History  Substance Use Topics  . Smoking status: Former Games developer  . Smokeless tobacco: Not on file  . Alcohol Use: No     Objective: Filed Vitals:   05/29/12 1012  BP: 113/65  Pulse: 65    General: Alert and Oriented, No Acute Distress HEENT: Pupils equal, round, reactive to light. Conjunctivae clear.  Moist mucous membranes Lungs: Diffuse rales mild to moderate severity all lung fields, no wheezing no rhonchi.  Comfortable work of breathing. Good air movement. Cardiac: Regular rate and rhythm. Normal S1/S2.  No murmurs, rubs, nor gallops.   Abdomen: Obese soft nontender Extremities: 1+ nonpitting  edema from mid forearm distally symmetrical right and left. No lower extremity edema  Strong peripheral pulses.  Mental Status: No depression, anxiety, nor agitation. Skin: Warm and dry.  Assessment & Plan: [name] was seen today for edema.  Diagnoses and associated orders for this visit:  Chest rales - DG Chest 2 View; Future  Acute pulmonary edema - 2D Echocardiogram without contrast; Future - COMPLETE METABOLIC PANEL WITH GFR - furosemide (LASIX) 20 MG tablet; Take 1 tablet (20 mg total) by mouth 2 (two) times daily.  Edema of upper extremity    Chest x-ray was obtained personal interpretation reveals pulmonary edema, fortunately his sats are within normal limits and he denies subjective shortness of breath. Checking renal function and starting Lasix. Echocardiogram scheduled for tomorrow. Followup with me or PCP on Thursday. Discussed no more than 2 g of salt/sodium a day and avoiding NSAIDs use pending resutls of above.  Return in about 3 days (around 06/01/2012).

## 2012-05-29 NOTE — Patient Instructions (Signed)
Do not add any salt to your food.  Focus hard on keeping salt (sodium) intake to less than 2000mg  a day. Stop taking Aleve, Ibuprofen, or Advil products.  Avoid anything known as a NSAID pain medication. Return on Thursday for recheck of pulmonary edema and fluid retention.  We're getting a Echocardogram to rule out heart failure.

## 2012-05-30 ENCOUNTER — Telehealth: Payer: Self-pay | Admitting: *Deleted

## 2012-05-30 ENCOUNTER — Ambulatory Visit (HOSPITAL_COMMUNITY)
Admission: RE | Admit: 2012-05-30 | Discharge: 2012-05-30 | Disposition: A | Discharge status: Home / Self Care | Payer: Medicare Other | Source: Ambulatory Visit | Attending: Family Medicine | Admitting: Family Medicine

## 2012-05-30 DIAGNOSIS — Z87891 Personal history of nicotine dependence: Secondary | ICD-10-CM | POA: Insufficient documentation

## 2012-05-30 DIAGNOSIS — I4891 Unspecified atrial fibrillation: Secondary | ICD-10-CM | POA: Diagnosis not present

## 2012-05-30 DIAGNOSIS — E785 Hyperlipidemia, unspecified: Secondary | ICD-10-CM | POA: Diagnosis not present

## 2012-05-30 DIAGNOSIS — I369 Nonrheumatic tricuspid valve disorder, unspecified: Secondary | ICD-10-CM

## 2012-05-30 DIAGNOSIS — J810 Acute pulmonary edema: Secondary | ICD-10-CM

## 2012-05-30 DIAGNOSIS — I10 Essential (primary) hypertension: Secondary | ICD-10-CM | POA: Diagnosis not present

## 2012-05-30 DIAGNOSIS — I359 Nonrheumatic aortic valve disorder, unspecified: Secondary | ICD-10-CM | POA: Insufficient documentation

## 2012-05-30 NOTE — Telephone Encounter (Signed)
Called and left message yesterday on pt's voice mail that 2D echocardiogram without contrast has been scheduled at [facility] @ 330. Called pt this am he verified that he received appt info and he will be able to make appt

## 2012-05-30 NOTE — Progress Notes (Signed)
  Echocardiogram 2D Echocardiogram has been performed.  Angelina Sheriff 05/30/2012, 4:39 PM

## 2012-06-01 ENCOUNTER — Ambulatory Visit (INDEPENDENT_AMBULATORY_CARE_PROVIDER_SITE_OTHER): Payer: Medicare Other | Admitting: Family Medicine

## 2012-06-01 ENCOUNTER — Encounter: Payer: Self-pay | Admitting: Family Medicine

## 2012-06-01 VITALS — BP 102/58 | HR 65 | Wt 253.0 lb

## 2012-06-01 DIAGNOSIS — R609 Edema, unspecified: Secondary | ICD-10-CM | POA: Diagnosis not present

## 2012-06-01 DIAGNOSIS — J810 Acute pulmonary edema: Secondary | ICD-10-CM

## 2012-06-01 DIAGNOSIS — M25519 Pain in unspecified shoulder: Secondary | ICD-10-CM | POA: Diagnosis not present

## 2012-06-01 DIAGNOSIS — M25511 Pain in right shoulder: Secondary | ICD-10-CM

## 2012-06-01 LAB — BASIC METABOLIC PANEL WITH GFR
CO2: 24 mEq/L (ref 19–32)
Calcium: 9.5 mg/dL (ref 8.4–10.5)
Creat: 1.53 mg/dL — ABNORMAL HIGH (ref 0.50–1.35)

## 2012-06-01 MED ORDER — TRAMADOL HCL 50 MG PO TABS: 50.0000 mg | ORAL_TABLET | Freq: Three times a day (TID) | ORAL | Status: DC | PRN

## 2012-06-01 NOTE — Progress Notes (Signed)
CC: [name] is a 78 y.o. male is here for f/u edema   Subjective: HPI:  Followup for peripheral edema and pulmonary edema. He's been taking Lasix twice a day and has noticed increased urine output. He believes the swelling has mildly to moderately improved and in hindsight he notices his breathing is much better since starting Lasix. He has cut out adding salt to his diet and no longer taking nonsteroidal anti-inflammatories.. he had a very favorable echocardiogram yesterday. He denies asymmetric swelling, skin changes, shortness of breath, cough, orthopnea, irregular heartbeat, chest pain, confusion.  He mentions since stopping nonsteroidal anti-inflammatories his right shoulder pain has worsened. Seems to be worse in the night when rolling over on it or sleeping on the right shoulder. He was told to 3 months ago he has a tear of the rotator cuff had mild improvement with corticosteroid injection. He denies motor or sensory disturbances or weakness in the right upper extremity. He wants to know what he can take to help with pain   Review Of Systems Outlined In HPI  Past Medical History  Diagnosis Date  . Hypertension   . Hyperlipidemia   . Aortic stenosis   . Dermatitis     poison ivy  . Cleft palate      Family History  Problem Relation Age of Onset  . Heart attack Mother   . Cancer Father     lung and stomach  . Heart attack Father   . Cancer Sister     bone  . Diabetes Sister   . Heart murmur Brother   . Hypertension Brother   . Hyperlipidemia Brother   . Heart attack Brother      History  Substance Use Topics  . Smoking status: Former Games developer  . Smokeless tobacco: Not on file  . Alcohol Use: No     Objective: Filed Vitals:   06/01/12 0935  BP: 102/58  Pulse: 65    General: Alert and Oriented, No Acute Distress HEENT: Pupils equal, round, reactive to light. Conjunctivae clear.  Moist mucous membranes Lungs: Clear to auscultation bilaterally, no  wheezing/ronchi/rales.  Comfortable work of breathing. Good air movement. Cardiac: Regular rate and rhythm. Normal S1/S2.  No murmurs, rubs, nor gallops.   Abdomen: Normal bowel sounds, soft and non tender without palpable masses. Extremities: Trace upper extremity edema from the wrists to the fingers bilaterally and symmetric.  Strong peripheral pulses.  Full range of motion and strength of the right upper extremity. No lower extremity edema Mental Status: No depression, anxiety, nor agitation. Skin: Warm and dry.  Assessment & Plan: [name] was seen today for f/u edema.  Diagnoses and associated orders for this visit:  Acute pulmonary edema  Peripheral edema - BASIC METABOLIC PANEL WITH GFR  Pain in joint, shoulder region, right - traMADol (ULTRAM) 50 MG tablet; Take 1 tablet (50 mg total) by mouth every 8 (eight) hours as needed for pain.    Acute pulmonary edema and peripheral edema: Improved, significant improvement in lung exam. I like him to finish out Lasix he has on hand. Obtain BMP to help determine if he can continue this regimen or if we should start back to one a day. Continue avoiding nonsteroidal anti-inflammatories and minimize salt less than 2 g a day. Right shoulder pain: Provided with prescription of tramadol as substitution for nonsteroidal anti-inflammatory, may take Tylenol as needed Followup with cardiologist as already scheduled in mid May  25 minutes spent face-to-face during visit today of  which at least 50% was counseling or coordinating care regarding acute pulmonary edema, peripheral edema, right shoulder pain.    Return if symptoms worsen or fail to improve.

## 2012-06-07 ENCOUNTER — Encounter: Payer: Self-pay | Admitting: Family Medicine

## 2012-06-07 ENCOUNTER — Ambulatory Visit (INDEPENDENT_AMBULATORY_CARE_PROVIDER_SITE_OTHER): Payer: Medicare Other | Admitting: Family Medicine

## 2012-06-07 VITALS — BP 107/65 | HR 70 | Temp 97.5°F | Wt 248.0 lb

## 2012-06-07 DIAGNOSIS — Z125 Encounter for screening for malignant neoplasm of prostate: Secondary | ICD-10-CM | POA: Diagnosis not present

## 2012-06-07 DIAGNOSIS — E538 Deficiency of other specified B group vitamins: Secondary | ICD-10-CM | POA: Diagnosis not present

## 2012-06-07 DIAGNOSIS — R5383 Other fatigue: Secondary | ICD-10-CM

## 2012-06-07 DIAGNOSIS — R5381 Other malaise: Secondary | ICD-10-CM

## 2012-06-07 DIAGNOSIS — E559 Vitamin D deficiency, unspecified: Secondary | ICD-10-CM | POA: Diagnosis not present

## 2012-06-07 NOTE — Progress Notes (Signed)
CC: [name] is a 78 y.o. male is here for Fatigue   Subjective: HPI:  Patient returns with complaints of fatigue. This has been present for 2 weeks, present well before presenting for swelling and starting Lasix last week. Symptoms slowly getting worse and a daily basis. Symptoms are present on a daily basis and moderate in severity all hours of the day. Describes this as lack of energy and motivation to perform hobbies and chores or premature anything. Nothing particularly makes better or worse. Described only as tiredness and fatigue.  Specifically denies shortness of breath, orthopnea, chest pain, irregular heartbeat, confusion, headaches, fevers, chills, nausea, vomiting, abdominal pain, constipation, diarrhea. Denies changes in medications around the time symptoms started.  States his swelling has significantly improved since starting Lasix still taking twice a day and having trouble sleeping more than a few hours at night due to waking to urinate one to 2 times. Of note patient was instructed to cut back to once a day dosing.    Review Of Systems Outlined In HPI  Past Medical History  Diagnosis Date  . Hypertension   . Hyperlipidemia   . Aortic stenosis   . Dermatitis     poison ivy  . Cleft palate      Family History  Problem Relation Age of Onset  . Heart attack Mother   . Cancer Father     lung and stomach  . Heart attack Father   . Cancer Sister     bone  . Diabetes Sister   . Heart murmur Brother   . Hypertension Brother   . Hyperlipidemia Brother   . Heart attack Brother      History  Substance Use Topics  . Smoking status: Former Games developer  . Smokeless tobacco: Not on file  . Alcohol Use: No     Objective: Filed Vitals:   06/07/12 1356  BP: 107/65  Pulse: 70  Temp: 97.5 F (36.4 C)    General: Alert and Oriented, No Acute Distress HEENT: Pupils equal, round, reactive to light. Conjunctivae clear.  External ears unremarkable, canals clear  with intact TMs with appropriate landmarks.  Middle ear appears open without effusion. Pink inferior turbinates.  Moist mucous membranes, pharynx without inflammation nor lesions.  Neck supple without palpable lymphadenopathy nor abnormal masses. Lungs: Clear to auscultation bilaterally, no wheezing/ronchi/rales.  Comfortable work of breathing. Good air movement. Cardiac: Regular rate and rhythm. Normal S1/S2.  No murmurs, rubs, nor gallops.   Abdomen: Obese soft nontender Extremities: No peripheral edema.  Strong peripheral pulses.  Mental Status: No depression, anxiety, nor agitation. Skin: Warm and dry.  Assessment & Plan: [name] was seen today for fatigue.  Diagnoses and associated orders for this visit:  Fatigue - CBC - TSH - T4, free - T3, free  Malaise - CBC - TSH - T4, free - T3, free  Vitamin B 12 deficiency - Methylmalonic Acid  Vitamin D deficiency - Cancel: Vitamin D2,D3 Panel - Vitamin D 25 hydroxy  Screening for prostate cancer - PSA, Medicare    Fatigue and malaise: Will rule out anemia, thyroid abnormality (hypothyroidism in father and brother). Symptoms are consistent with B12 deficiency and or vitamin D deficiency therefore obtaining levels. Have asked him to cut back to a single dose of Lasix a day preferably in the morning. Encouraged him to stop this regimen once he runs out of next 7 days of doses unless Dr. Jens Som says otherwise. If symptoms persist with normal labs and if  nocturia improves we'll consider sleep study to rule out OSA. Patient tells me he has not had a PSA in over 5 years, he would like nobody screen for prostate cancer with today's blood draw. Discussed risks and benefits of screening.  Return in about 2 weeks (around 06/21/2012).

## 2012-06-08 LAB — CBC
MCH: 27.2 pg (ref 26.0–34.0)
MCV: 77.1 fL — ABNORMAL LOW (ref 78.0–100.0)
Platelets: 214 10*3/uL (ref 150–400)
RDW: 18.4 % — ABNORMAL HIGH (ref 11.5–15.5)
WBC: 12.5 10*3/uL — ABNORMAL HIGH (ref 4.0–10.5)

## 2012-06-08 LAB — T4, FREE: Free T4: 1.6 ng/dL (ref 0.80–1.80)

## 2012-06-08 LAB — PSA, MEDICARE: PSA: 0.4 ng/mL (ref <=4.00)

## 2012-06-08 LAB — VITAMIN D 25 HYDROXY (VIT D DEFICIENCY, FRACTURES): Vit D, 25-Hydroxy: 33 ng/mL (ref 30–89)

## 2012-06-09 ENCOUNTER — Ambulatory Visit (INDEPENDENT_AMBULATORY_CARE_PROVIDER_SITE_OTHER): Payer: Medicare Other | Admitting: Cardiology

## 2012-06-09 ENCOUNTER — Inpatient Hospital Stay (INDEPENDENT_AMBULATORY_CARE_PROVIDER_SITE_OTHER)
Admit: 2012-06-09 | Discharge: 2012-06-09 | Disposition: A | Discharge status: Home / Self Care | Payer: Medicare Other | Attending: Cardiology | Admitting: Cardiology

## 2012-06-09 ENCOUNTER — Encounter: Payer: Self-pay | Admitting: Cardiology

## 2012-06-09 VITALS — BP 120/78 | HR 69 | Wt 248.0 lb

## 2012-06-09 DIAGNOSIS — R030 Elevated blood-pressure reading, without diagnosis of hypertension: Secondary | ICD-10-CM

## 2012-06-09 DIAGNOSIS — I359 Nonrheumatic aortic valve disorder, unspecified: Secondary | ICD-10-CM

## 2012-06-09 DIAGNOSIS — I4891 Unspecified atrial fibrillation: Secondary | ICD-10-CM | POA: Diagnosis not present

## 2012-06-09 DIAGNOSIS — E785 Hyperlipidemia, unspecified: Secondary | ICD-10-CM | POA: Diagnosis not present

## 2012-06-09 DIAGNOSIS — R0602 Shortness of breath: Secondary | ICD-10-CM

## 2012-06-09 DIAGNOSIS — M25539 Pain in unspecified wrist: Secondary | ICD-10-CM | POA: Insufficient documentation

## 2012-06-09 LAB — HEPATIC FUNCTION PANEL
Albumin: 3.9 g/dL (ref 3.5–5.2)
Alkaline Phosphatase: 58 U/L (ref 39–117)
Total Bilirubin: 0.8 mg/dL (ref 0.3–1.2)

## 2012-06-09 LAB — BASIC METABOLIC PANEL
BUN: 27 mg/dL — ABNORMAL HIGH (ref 6–23)
GFR: 48.76 mL/min — ABNORMAL LOW (ref >60.00)
Potassium: 3.5 mEq/L (ref 3.5–5.1)

## 2012-06-09 LAB — BRAIN NATRIURETIC PEPTIDE: Pro B Natriuretic peptide (BNP): 66 pg/mL (ref 0.0–100.0)

## 2012-06-09 NOTE — Assessment & Plan Note (Signed)
Patient describes recent bilateral wrist and hand pain with edema. He was placed on Lasix. He did not have lower extremity edema. His echo shows normal LV function and a normally functioning aortic valve. Chest x-ray showed infiltrates possibly secondary to edema. He now has some dizziness with standing. I think he may be dehydrated. Discontinue Lasix. Check potassium, renal function and BNP. Repeat chest x-ray. I wonder if he may have a rheumatologic syndrome to explain his pulmonary infiltrates and bilateral wrist pain. Check sedimentation rate. Note recent white blood cell count mildly elevated.

## 2012-06-09 NOTE — Progress Notes (Signed)
HPI: Pleasant male I saw in July of 2011 for evaluation of AVR. Previously diagnosed with severe AS by echo. Cardiac catheterization in Sept 2011 revealed nonobstructive coronary disease, normal LV function and moderately severe aortic stenosis with a mean gradient of 34 mm of mercury. The patient then underwent aortic valve replacement on September 21 of 2011 with a pericardial tissue valve. Last echocardiogram performed in May of 2014. This showed normal LV function and a normal tissue aortic valve; mean gradient 12 mm of mercury; no aortic insufficiency. I last saw him in March 2013. Patient seen recently with complaints of upper extremity edema. Chest x-ray in may of 2012 showed right greater than left interstitial and alveolar opacity favored to represent asymmetric edema. Recent hemoglobin 12.9 with an MCV of 77; White blood cell count 12.5. TSH 2.936. Patient states 2 weeks ago he developed fatigue. He also had pain and swelling in his wrists bilaterally and his hands. He also had some shoulder pain. There was some shortness of breath but no orthopnea, PND or lower extremity edema. No chest pain, fevers or chills   Current Outpatient Prescriptions  Medication Sig Dispense Refill  . aspirin 81 MG tablet Take 81 mg by mouth daily.        . furosemide (LASIX) 20 MG tablet Take 20 mg by mouth daily.      . metoprolol tartrate (LOPRESSOR) 25 MG tablet TAKE 1 TABLET BY MOUTH TWICE DAILY  180 tablet  2  . pantoprazole (PROTONIX) 40 MG tablet TAKE 1 TABLET BY MOUTH DAILY  30 tablet  0  . pravastatin (PRAVACHOL) 40 MG tablet Take 1 tablet (40 mg total) by mouth daily.  90 tablet  3  . traMADol (ULTRAM) 50 MG tablet Take 1 tablet (50 mg total) by mouth every 8 (eight) hours as needed for pain.  45 tablet  1  . [DISCONTINUED] tadalafil (CIALIS) 20 MG tablet Take 20 mg by mouth daily as needed. Take one about 1-2 hours before sexual intercourse        No current facility-administered medications for this  visit.     Past Medical History  Diagnosis Date  . Hypertension   . Hyperlipidemia   . Aortic stenosis   . Dermatitis     poison ivy  . Cleft palate     Past Surgical History  Procedure Laterality Date  . Bilat cataract sx  2004  . Cholecystectomy  1999  . 7 surgeries for cleft palate  1938  . Oral surgery with bone graft from hip for cleft palate  1989  . Right groin hernia repair  1950's  . Rotator cuff tear, left shoulder    . Aortic valve replacement w/ pericardial tissue valve  10-15-09    Dr Barry Dienes    History   Social History  . Marital Status: Married    Spouse Name: N/A    Number of Children: N/A  . Years of Education: N/A   Occupational History  . Not on file.   Social History Main Topics  . Smoking status: Former Games developer  . Smokeless tobacco: Not on file  . Alcohol Use: No  . Drug Use: No  . Sexually Active: Not on file   Other Topics Concern  . Not on file   Social History Narrative  . No narrative on file    ROS: no fevers or chills, productive cough, hemoptysis, dysphasia, odynophagia, melena, hematochezia, dysuria, hematuria, rash, seizure activity, orthopnea, PND, pedal edema, claudication. Remaining systems  are negative.  Physical Exam: Well-developed well-nourished in no acute distress.  Skin is warm and dry.  HEENT is normal.  Neck is supple.  Chest is clear to auscultation with normal expansion.  Cardiovascular exam is regular rate and rhythm. No diastolic murmur Abdominal exam nontender or distended. No masses palpated. Extremities show no edema. neuro grossly intact  ECG sinus rhythm at a rate of 69. First degree AV block. Nonspecific T-wave changes. CRO prior inferior infarct.

## 2012-06-09 NOTE — Assessment & Plan Note (Signed)
Continue SBE prophylaxis. Recent echocardiogram showed normally functioning prosthetic aortic valve.

## 2012-06-09 NOTE — Assessment & Plan Note (Signed)
Continue present blood pressure medications. 

## 2012-06-09 NOTE — Patient Instructions (Addendum)
Your physician recommends that you schedule a follow-up appointment in: 6 WEEKS WITH DR CRENSHAW  STOP FUROSEMIDE  Your physician recommends that you HAVE LAB WORK TODAY   A chest x-ray takes a picture of the organs and structures inside the chest, including the heart, lungs, and blood vessels. This test can show several things, including, whether the heart is enlarges; whether fluid is building up in the lungs; and whether pacemaker / defibrillator leads are still in place. AT THE [address] OFFICE

## 2012-06-09 NOTE — Addendum Note (Signed)
Addended by: Freddi Starr on: 06/09/2012 01:36 PM   Modules accepted: Orders

## 2012-06-09 NOTE — Assessment & Plan Note (Signed)
Continue statin.check liver functions.

## 2012-06-12 ENCOUNTER — Telehealth: Payer: Self-pay | Admitting: Family Medicine

## 2012-06-12 ENCOUNTER — Encounter: Payer: Self-pay | Admitting: Cardiology

## 2012-06-12 DIAGNOSIS — R918 Other nonspecific abnormal finding of lung field: Secondary | ICD-10-CM

## 2012-06-12 DIAGNOSIS — M25519 Pain in unspecified shoulder: Secondary | ICD-10-CM | POA: Diagnosis not present

## 2012-06-12 NOTE — Telephone Encounter (Signed)
[name], Will you please let the [facility] family know that Dr. Ludwig Clarks chest xray shows what was seen on the image from earlier in may which may be fluid on the lungs or an infection.  We've ordered a CT scan to get a better look at what this might be and if it's contributing to his fatigue.  I'll let him know once the results are available.

## 2012-06-12 NOTE — Telephone Encounter (Signed)
Message copied by Laren Boom on Mon Jun 12, 2012  7:53 AM ------      Message from: Lewayne Bunting      Created: Sat Jun 10, 2012  7:13 AM       I am not convinced pulmonary infiltrates represent edema based on exam, history and BNP; would arrange noncontrast chest CT and pulmonary eval.      Olga Millers       ------

## 2012-06-12 NOTE — Telephone Encounter (Signed)
Pt aware as well as spouse

## 2012-06-13 ENCOUNTER — Other Ambulatory Visit (INDEPENDENT_AMBULATORY_CARE_PROVIDER_SITE_OTHER): Payer: Medicare Other

## 2012-06-13 ENCOUNTER — Telehealth: Payer: Self-pay | Admitting: Family Medicine

## 2012-06-13 DIAGNOSIS — R918 Other nonspecific abnormal finding of lung field: Secondary | ICD-10-CM

## 2012-06-13 DIAGNOSIS — R0602 Shortness of breath: Secondary | ICD-10-CM | POA: Diagnosis not present

## 2012-06-13 MED ORDER — PREDNISONE 20 MG PO TABS: ORAL_TABLET | ORAL | Status: DC

## 2012-06-13 NOTE — Telephone Encounter (Signed)
Pt notified and voiced understanding 

## 2012-06-13 NOTE — Telephone Encounter (Signed)
Sue Lush, Can you please let the sherrel family that Dr. Delford Field out pulmonologist will be calling them about an appointment.  Between now and then we would like him to have a lung function test which they should receive a call about in the next day or two for scheduling.  Prednisone is a medication which will help with his fatigue and the inflammation, I've sent a rx of this to his pharmacy.

## 2012-06-13 NOTE — Telephone Encounter (Signed)
Message copied by Laren Boom on Tue Jun 13, 2012  2:46 PM ------      Message from: Storm Frisk      Created: Tue Jun 13, 2012  2:14 PM      Regarding: RE: pre-referral workup       Gregary Signs            I would be happy to see this patient            Yes order PFTs, can come to our office, needs full PFTs            Ok to start Prednisone 40mg  /d until I see him in [facility] office            My cell is [phone]   I take txt msgs            pat      ----- Message -----         From: Laren Boom, DO         Sent: 06/13/2012  12:11 PM           To: Storm Frisk, MD      Subject: pre-referral workup                                      Pat,      I've lost your cell phone number you gave me but still wanted to bounce a question off you.  I've got a 78 year old gentleman who we found to have a CT scan suggestive of cryptogenic organizing pneumonia in the setting of new onset fatigue and exertional dyspnea.  The literature I've reviewed makes it sound like PFTs may help determine severity which would dictate an appropriate step of treatment.  I was thinking of referring this guy to you, but wanted to know if you'd recommend I get PFTs on him before the visit and before even considering starting prednisone.  Any input appreciated,      -Monique Gift        ------

## 2012-06-15 ENCOUNTER — Other Ambulatory Visit: Payer: Self-pay | Admitting: Family Medicine

## 2012-06-15 ENCOUNTER — Ambulatory Visit (INDEPENDENT_AMBULATORY_CARE_PROVIDER_SITE_OTHER): Payer: Medicare Other | Admitting: Critical Care Medicine

## 2012-06-15 ENCOUNTER — Encounter: Payer: Self-pay | Admitting: Critical Care Medicine

## 2012-06-15 VITALS — BP 126/80 | HR 98 | Temp 97.7°F | Ht 73.0 in | Wt 251.0 lb

## 2012-06-15 DIAGNOSIS — J84112 Idiopathic pulmonary fibrosis: Secondary | ICD-10-CM | POA: Diagnosis not present

## 2012-06-15 DIAGNOSIS — J84116 Cryptogenic organizing pneumonia: Secondary | ICD-10-CM | POA: Diagnosis not present

## 2012-06-15 DIAGNOSIS — J8489 Other specified interstitial pulmonary diseases: Secondary | ICD-10-CM | POA: Insufficient documentation

## 2012-06-15 MED ORDER — PREDNISONE 20 MG PO TABS: ORAL_TABLET | ORAL | Status: DC

## 2012-06-15 MED ORDER — ACETYLCYSTEINE 600 MG PO CAPS: 1.0000 | ORAL_CAPSULE | Freq: Three times a day (TID) | ORAL | Status: DC

## 2012-06-15 NOTE — Progress Notes (Signed)
Subjective:    Patient ID: [name], male    DOB: [birth year], 78 y.o.   MRN: [id number]  HPI Comments: Hx of weakness and stamina issues. If go for long distances, get light headed. Pred started at 40mg  /d 48hrs ago and helped. Pt also on fluid med and stopped a week ago Initially thought CHF but cardiac w/u neg  Shortness of Breath The current episode started more than 1 month ago. The problem occurs daily (dyspnea on exertion). The problem has been gradually worsening. Associated symptoms include orthopnea, rhinorrhea and wheezing. Pertinent negatives include no abdominal pain, chest pain, claudication, coryza, ear pain, fever, headaches, hemoptysis, leg pain, leg swelling, neck pain, PND, rash, sore throat, sputum production, swollen glands, syncope or vomiting. The symptoms are aggravated by any activity and exercise. Associated symptoms comments: Hx of gerd. Risk factors include smoking. His past medical history is significant for CAD and chronic lung disease. There is no history of allergies, aspirin allergies, asthma, bronchiolitis, COPD, DVT, a heart failure, PE, pneumonia or a recent surgery.    Past Medical History  Diagnosis Date  . Hypertension   . Hyperlipidemia   . Aortic stenosis     AVR 2011  . Dermatitis     poison ivy  . Cleft palate      Family History  Problem Relation Age of Onset  . Heart attack Mother   . Cancer Father     lung and stomach  . Heart attack Father   . Cancer Sister     bone  . Diabetes Sister   . Heart murmur Brother   . Hypertension Brother   . Hyperlipidemia Brother   . Heart attack Brother      History   Social History  . Marital Status: Married    Spouse Name: N/A    Number of Children: N/A  . Years of Education: N/A   Occupational History  . Retired    Social History Main Topics  . Smoking status: Former Smoker -- 1.50 packs/day for 5 years    Types: Cigarettes, Cigars    Quit date: 09/08/1977  . Smokeless  tobacco: Never Used  . Alcohol Use: No  . Drug Use: No  . Sexually Active: Not on file   Other Topics Concern  . Not on file   Social History Narrative  . No narrative on file     No Known Allergies   Outpatient Prescriptions Prior to Visit  Medication Sig Dispense Refill  . aspirin 81 MG tablet Take 81 mg by mouth daily.        . metoprolol tartrate (LOPRESSOR) 25 MG tablet TAKE 1 TABLET BY MOUTH TWICE DAILY  180 tablet  2  . pravastatin (PRAVACHOL) 40 MG tablet Take 1 tablet (40 mg total) by mouth daily.  90 tablet  3  . traMADol (ULTRAM) 50 MG tablet Take 1 tablet (50 mg total) by mouth every 8 (eight) hours as needed for pain.  45 tablet  1  . predniSONE (DELTASONE) 20 MG tablet Two by mouth every morning to prevent lung inflammation.  60 tablet  1   No facility-administered medications prior to visit.      Review of Systems  Constitutional: Positive for activity change, appetite change and fatigue. Negative for fever, chills, diaphoresis and unexpected weight change.  HENT: Positive for rhinorrhea. Negative for hearing loss, ear pain, nosebleeds, congestion, sore throat, facial swelling, sneezing, mouth sores, trouble swallowing, neck pain, neck stiffness, dental problem, voice  change, postnasal drip, sinus pressure, tinnitus and ear discharge.   Eyes: Negative for photophobia, discharge, itching and visual disturbance.  Respiratory: Positive for shortness of breath and wheezing. Negative for apnea, cough, hemoptysis, sputum production, choking, chest tightness and stridor.   Cardiovascular: Positive for orthopnea. Negative for chest pain, palpitations, claudication, leg swelling, syncope and PND.  Gastrointestinal: Negative for nausea, vomiting, abdominal pain, constipation, blood in stool and abdominal distention.  Genitourinary: Negative for dysuria, urgency, frequency, hematuria, flank pain, decreased urine volume and difficulty urinating.  Musculoskeletal: Positive for  joint swelling and arthralgias. Negative for myalgias, back pain and gait problem.       Prednisone helped joint symptoms  Skin: Negative for color change, pallor and rash.  Neurological: Positive for dizziness, weakness, light-headedness and numbness. Negative for tremors, seizures, syncope, speech difficulty and headaches.  Hematological: Negative for adenopathy. Does not bruise/bleed easily.  Psychiatric/Behavioral: Positive for sleep disturbance. Negative for confusion and agitation. The patient is not nervous/anxious.        Objective:   Physical Exam Filed Vitals:   06/15/12 1458  BP: 126/80  Pulse: 98  Temp: 97.7 F (36.5 C)  TempSrc: Oral  Height: 6\' 1"  (1.854 m)  Weight: 251 lb (113.853 kg)  SpO2: 98%    Gen: Pleasant, well-nourished, in no distress,  normal affect Note cleft palate and abnormal speech pattern  ENT: No lesions,  mouth clear,  oropharynx clear, no postnasal drip  Neck: No JVD, no TMG, no carotid bruits  Lungs: No use of accessory muscles, no dullness to percussion, Dry rales and poor breath sounds  Cardiovascular: RRR, heart sounds normal, no murmur or gallops, no peripheral edema  Abdomen: soft and NT, no HSM,  BS normal  Musculoskeletal: No deformities, no cyanosis or clubbing  Neuro: alert, non focal  Skin: Warm, no lesions or rashes  CT chest 06/12/2012  Findings:  Mediastinum: Heart size is mildly enlarged. There is no significant  pericardial fluid, thickening or pericardial calcification. There  is atherosclerosis of the thoracic aorta, the great vessels of the  mediastinum and the coronary arteries, including calcified  atherosclerotic plaque in the left main, left anterior descending,  left circumflex and right coronary arteries. Calcifications of the  mitral annulus. Status post median sternotomy for aortic valve  replacement (there appears to be a stented bioprosthesis in  position). There is a large hiatal hernia. No  pathologically  enlarged mediastinal or hilar lymph nodes. Please note that  accurate exclusion of hilar adenopathy is limited on noncontrast CT  scans.  Lungs/Pleura: Scattered throughout the lungs bilaterally (right  greater than left) there are patchy areas of peripheral predominant  ground-glass attenuation with some associated septal thickening and  architectural distortion. This is most pronounced in the right  lower lobe where there is some associated traction bronchiectasis.  Sparing of the immediate subpleural interstitium is noted. No frank  honeycombing identified at this time. No acute consolidative air  space disease. No pleural effusions. No definite suspicious  appearing pulmonary nodules or masses are confidently identified,  however, the examination is significantly limited by a large amount  of patient respiratory motion.  Upper Abdomen: Status post cholecystectomy. Atherosclerosis.  Colonic diverticula are noted, without surrounding inflammatory  changes to suggest an acute diverticulitis in the visualized  portions of the upper abdomen at this time.  Musculoskeletal: Sternotomy wires. There are no aggressive  appearing lytic or blastic lesions noted in the visualized portions  of the skeleton.  IMPRESSION:  1.  The appearance of the lung parenchyma is unusual, but is most  suggestive of interstitial lung disease. Given the pattern, in  particular, the subpleural sparing, findings are favored to  represent cryptogenic organizing pneumonia (COP). The repeat high  resolution chest CT and 12 months may be useful to exclude temporal  progression of disease.  2. Atherosclerosis, including left main and three-vessel coronary  artery disease. Assessment for potential risk factor  modification, dietary therapy or pharmacologic therapy may be  warranted, if clinically indicated.  3. Status post median sternotomy for aortic valve replacement with  a stented bioprosthesis.   4. Large hiatal hernia.  5. Additional incidental findings, as above.        Assessment & Plan:   IPF (idiopathic pulmonary fibrosis) Probable idiopathic pulmonary fibrosis usual interstitial pneumonitis type. However note there is significant ground glass inflammation seen diffusely. Also the patient has significant arthritis that did respond to prednisone. This patient may have an autoimmune associated form of interstitial pneumonitis. Thus it could be nonspecific interstitial pneumonitis associated with conditions such as mixed connective tissue, rheumatoid, or lupus. In any case this patient needs a complete evaluation of the interstitial fibrosis. This does not appear to be consistent with bronchiolitis obliterans organized pneumonia so-called cryptogenic organizing pneumonitis. Plan Pulmonary functions will be obtained Start N acetyl cysteine 600mg  three times daily (go to nutrition store like [employer] to find) for antioxidant property Stay on prednisone 40mg  daily for 10 more days then 30mg  per day and stay Labs today to include complete pulmonary fibrosis panel We may recommend an open lung biopsy We will refer to rheumatology Return 6    Updated Medication List Outpatient Encounter Prescriptions as of 06/15/2012  Medication Sig Dispense Refill  . aspirin 81 MG tablet Take 81 mg by mouth daily.        . metoprolol tartrate (LOPRESSOR) 25 MG tablet TAKE 1 TABLET BY MOUTH TWICE DAILY  180 tablet  2  . OVER THE COUNTER MEDICATION Super beta prostate - take 1 tablet by mouth twice daily      . pantoprazole (PROTONIX) 40 MG tablet TAKE 1 TABLET BY MOUTH EVERY DAY  30 tablet  0  . pravastatin (PRAVACHOL) 40 MG tablet Take 1 tablet (40 mg total) by mouth daily.  90 tablet  3  . predniSONE (DELTASONE) 20 MG tablet Take two daily for 10 more days then 1 1/2  A day and stay  60 tablet  1  . traMADol (ULTRAM) 50 MG tablet Take 1 tablet (50 mg total) by mouth every 8 (eight) hours as needed for  pain.  45 tablet  1  . [DISCONTINUED] predniSONE (DELTASONE) 20 MG tablet Two by mouth every morning to prevent lung inflammation.  60 tablet  1  . Acetylcysteine 600 MG CAPS Take 1 capsule (600 mg total) by mouth 3 (three) times daily.  90 capsule  11   No facility-administered encounter medications on file as of 06/15/2012.

## 2012-06-15 NOTE — Patient Instructions (Addendum)
Pulmonary functions will be obtained Start N acetyl cysteine 600mg  three times daily (go to nutrition store like [employer] to find) Stay on prednisone 40mg  daily for 10 more days then 30mg  per day and stay Labs today Return 6 weeks  We may recommend an open lung biopsy We will refer to rheumatology

## 2012-06-15 NOTE — Assessment & Plan Note (Signed)
Probable idiopathic pulmonary fibrosis usual interstitial pneumonitis type. However note there is significant ground glass inflammation seen diffusely. Also the patient has significant arthritis that did respond to prednisone. This patient may have an autoimmune associated form of interstitial pneumonitis. Thus it could be nonspecific interstitial pneumonitis associated with conditions such as mixed connective tissue, rheumatoid, or lupus. In any case this patient needs a complete evaluation of the interstitial fibrosis. This does not appear to be consistent with bronchiolitis obliterans organized pneumonia so-called cryptogenic organizing pneumonitis. Plan Pulmonary functions will be obtained Start N acetyl cysteine 600mg  three times daily (go to nutrition store like [employer] to find) for antioxidant property Stay on prednisone 40mg  daily for 10 more days then 30mg  per day and stay Labs today to include complete pulmonary fibrosis panel We may recommend an open lung biopsy We will refer to rheumatology Return 6

## 2012-06-16 ENCOUNTER — Ambulatory Visit (INDEPENDENT_AMBULATORY_CARE_PROVIDER_SITE_OTHER): Payer: Medicare Other | Admitting: Critical Care Medicine

## 2012-06-16 DIAGNOSIS — R0600 Dyspnea, unspecified: Secondary | ICD-10-CM

## 2012-06-16 DIAGNOSIS — R0609 Other forms of dyspnea: Secondary | ICD-10-CM

## 2012-06-16 DIAGNOSIS — J84116 Cryptogenic organizing pneumonia: Secondary | ICD-10-CM | POA: Diagnosis not present

## 2012-06-16 LAB — ALLERGY FULL PROFILE
Allergen,Goose feathers, e70: <0.1 kU/L
Bermuda Grass: <0.1 kU/L
Box Elder IgE: <0.1 kU/L
Candida Albicans: <0.1 kU/L
Fescue: <0.1 kU/L
G005 Rye, Perennial: <0.1 kU/L
Goldenrod: <0.1 kU/L
Helminthosporium halodes: <0.1 kU/L
IgE (Immunoglobulin E), Serum: 46.2 IU/mL (ref 0.0–180.0)
Oak: <0.1 kU/L
Stemphylium Botryosum: <0.1 kU/L
Timothy Grass: <0.1 kU/L

## 2012-06-16 LAB — SEDIMENTATION RATE: Sed Rate: 4 mm/hr (ref 0–16)

## 2012-06-16 LAB — RHEUMATOID FACTOR: Rhuematoid fact SerPl-aCnc: <10 IU/mL (ref <=14)

## 2012-06-16 LAB — ANTI-SCLERODERMA ANTIBODY: Scleroderma (Scl-70) (ENA) Antibody, IgG: <1 AU/mL (ref <30)

## 2012-06-16 LAB — SJOGRENS SYNDROME-A EXTRACTABLE NUCLEAR ANTIBODY: SSA (Ro) (ENA) Antibody, IgG: 3 AU/mL (ref <30)

## 2012-06-16 LAB — SJOGRENS SYNDROME-B EXTRACTABLE NUCLEAR ANTIBODY: SSB (La) (ENA) Antibody, IgG: <1 AU/mL (ref <30)

## 2012-06-16 NOTE — Progress Notes (Signed)
PFT done today. 

## 2012-06-20 LAB — FUNGAL ANTIBODIES PANEL, ID-BLOOD
Aspergillus Niger Antibodies: NEGATIVE
Aspergillus fumigatus: NEGATIVE

## 2012-06-20 LAB — ALPHA-1 ANTITRYPSIN PHENOTYPE: A-1 Antitrypsin: 149 mg/dL (ref 83–199)

## 2012-06-21 ENCOUNTER — Other Ambulatory Visit: Payer: Self-pay | Admitting: Family Medicine

## 2012-06-21 LAB — ANA: Anti Nuclear Antibody(ANA): POSITIVE — AB

## 2012-06-21 LAB — ANTI-NUCLEAR AB-TITER (ANA TITER): ANA Titer 1: 1:1280 {titer} — ABNORMAL HIGH

## 2012-06-22 LAB — HYPERSENSITIVITY PNUEMONITIS PROFILE

## 2012-06-30 ENCOUNTER — Telehealth: Payer: Self-pay | Admitting: Critical Care Medicine

## 2012-06-30 DIAGNOSIS — J84112 Idiopathic pulmonary fibrosis: Secondary | ICD-10-CM

## 2012-06-30 NOTE — Telephone Encounter (Signed)
PFTs show moderate restriction from fibrosis He needs to stay on the prednisone and keep next appt so we can regroup

## 2012-06-30 NOTE — Telephone Encounter (Signed)
I gave pt results.  He is a candidate for EAP  Pt to stay on prednisone and f/u in office

## 2012-06-30 NOTE — Telephone Encounter (Signed)
Spoke with pt's spouse, [name] states that the pt was confused about the results that PW called with  I believe that Chan was referring to PFT's Chan would like a call with more details regarding the results, b/c pt is confused Chan states that the only thing he remembered was that is to stay on prednisone until ov The pt also wants to know if he still needs to keep appt with rheum Please advise thanks!

## 2012-06-30 NOTE — Telephone Encounter (Signed)
Spouse aware of results. She voiced her understanding and needed nothing further

## 2012-07-03 ENCOUNTER — Encounter: Payer: Self-pay | Admitting: Critical Care Medicine

## 2012-07-10 ENCOUNTER — Ambulatory Visit: Payer: Medicare Other | Admitting: Critical Care Medicine

## 2012-07-11 DIAGNOSIS — J8410 Pulmonary fibrosis, unspecified: Secondary | ICD-10-CM | POA: Diagnosis not present

## 2012-07-12 ENCOUNTER — Encounter: Payer: Self-pay | Admitting: Family Medicine

## 2012-07-16 ENCOUNTER — Other Ambulatory Visit: Payer: Self-pay | Admitting: Family Medicine

## 2012-07-19 ENCOUNTER — Encounter: Payer: Self-pay | Admitting: Critical Care Medicine

## 2012-07-19 NOTE — Telephone Encounter (Signed)
PW please advise on patient's/family request. Thanks.

## 2012-07-20 ENCOUNTER — Ambulatory Visit (INDEPENDENT_AMBULATORY_CARE_PROVIDER_SITE_OTHER): Payer: Medicare Other | Admitting: Critical Care Medicine

## 2012-07-20 ENCOUNTER — Telehealth: Payer: Self-pay | Admitting: *Deleted

## 2012-07-20 ENCOUNTER — Encounter: Payer: Self-pay | Admitting: Critical Care Medicine

## 2012-07-20 VITALS — BP 130/80 | HR 65 | Temp 98.2°F | Ht 72.0 in | Wt 246.0 lb

## 2012-07-20 DIAGNOSIS — J8489 Other specified interstitial pulmonary diseases: Secondary | ICD-10-CM

## 2012-07-20 DIAGNOSIS — J84112 Idiopathic pulmonary fibrosis: Secondary | ICD-10-CM | POA: Diagnosis not present

## 2012-07-20 DIAGNOSIS — J8409 Other alveolar and parieto-alveolar conditions: Secondary | ICD-10-CM | POA: Diagnosis not present

## 2012-07-20 DIAGNOSIS — K219 Gastro-esophageal reflux disease without esophagitis: Secondary | ICD-10-CM | POA: Diagnosis not present

## 2012-07-20 MED ORDER — PREDNISONE 20 MG PO TABS: ORAL_TABLET | ORAL | Status: DC

## 2012-07-20 NOTE — Telephone Encounter (Signed)
pls see pt email regarding this. PW sent email to pt and will work him in today in [place] at 1:30 pm. I have placed pt on schedule. i have lmomtcb on home and cell #s to ensure they have received email and are able to come.

## 2012-07-20 NOTE — Telephone Encounter (Signed)
Pt was seen in the office today by Dr. Delford Field to address pt concerns and questions.

## 2012-07-20 NOTE — Patient Instructions (Addendum)
Stay on prednisone 30mg  a day for 7 days more then 20mg  a day for 14 days then 10mg  a day and STAY Stop N acetyl cysteine A referral to pulmonary rehab will be made Return 3 months

## 2012-07-20 NOTE — Telephone Encounter (Signed)
Jan/Spouse  Returning call to office to verify they are coming to the appt scheduled for 1330 07/20/12 as advised.

## 2012-07-20 NOTE — Progress Notes (Signed)
Subjective:    Patient ID: [name], male    DOB: 08-Dec-1934, 78 y.o.   MRN: [id number]  HPI Comments: Hx of weakness and stamina issues. If go for long distances, get light headed. Pred started at 40mg  /d 48hrs ago and helped. Pt also on fluid med and stopped a week ago Initially thought CHF but cardiac w/u neg  Shortness of Breath The current episode started more than 1 month ago. The problem occurs daily (dyspnea on exertion). The problem has been gradually worsening. Associated symptoms include orthopnea, rhinorrhea and wheezing. Pertinent negatives include no abdominal pain, chest pain, claudication, coryza, ear pain, fever, headaches, hemoptysis, leg pain, leg swelling, neck pain, PND, rash, sore throat, sputum production, swollen glands, syncope or vomiting. The symptoms are aggravated by any activity and exercise. Associated symptoms comments: Hx of gerd. Risk factors include smoking. His past medical history is significant for CAD and chronic lung disease. There is no history of allergies, aspirin allergies, asthma, bronchiolitis, COPD, DVT, a heart failure, PE, pneumonia or a recent surgery.   07/20/2012 At last ov we started prednisone Since last ov is better. No cough. No real chest pain No real wheeze.  Never had much mucus   Past Medical History  Diagnosis Date  . Hypertension   . Hyperlipidemia   . Aortic stenosis     AVR 2011  . Dermatitis     poison ivy  . Cleft palate      Family History  Problem Relation Age of Onset  . Heart attack Mother   . Cancer Father     lung and stomach  . Heart attack Father   . Cancer Sister     bone  . Diabetes Sister   . Heart murmur Brother   . Hypertension Brother   . Hyperlipidemia Brother   . Heart attack Brother      History   Social History  . Marital Status: Married    Spouse Name: N/A    Number of Children: N/A  . Years of Education: N/A   Occupational History  . Retired    Social History Main Topics   . Smoking status: Former Smoker -- 1.50 packs/day for 5 years    Types: Cigarettes, Cigars    Quit date: 09/08/1977  . Smokeless tobacco: Never Used  . Alcohol Use: No  . Drug Use: No  . Sexually Active: Not on file   Other Topics Concern  . Not on file   Social History Narrative  . No narrative on file     No Known Allergies   Outpatient Prescriptions Prior to Visit  Medication Sig Dispense Refill  . aspirin 81 MG tablet Take 81 mg by mouth daily.        . metoprolol tartrate (LOPRESSOR) 25 MG tablet TAKE 1 TABLET BY MOUTH TWICE DAILY  180 tablet  2  . OVER THE COUNTER MEDICATION Super beta prostate - take 1 tablet by mouth twice daily      . pantoprazole (PROTONIX) 40 MG tablet TAKE 1 TABLET BY MOUTH DAILY  30 tablet  0  . pravastatin (PRAVACHOL) 40 MG tablet TAKE 1 TABLET BY MOUTH EVERY NIGHT AT BEDTIME .  30 tablet  0  . traMADol (ULTRAM) 50 MG tablet Take 1 tablet (50 mg total) by mouth every 8 (eight) hours as needed for pain.  45 tablet  1  . pravastatin (PRAVACHOL) 40 MG tablet Take 1 tablet (40 mg total) by mouth daily.  90  tablet  3  . predniSONE (DELTASONE) 20 MG tablet Take two daily for 10 more days then 1 1/2  A day and stay  60 tablet  1  . Acetylcysteine 600 MG CAPS Take 1 capsule (600 mg total) by mouth 3 (three) times daily.  90 capsule  11   No facility-administered medications prior to visit.      Review of Systems  Constitutional: Positive for activity change, appetite change and fatigue. Negative for fever, chills, diaphoresis and unexpected weight change.  HENT: Positive for rhinorrhea. Negative for hearing loss, ear pain, nosebleeds, congestion, sore throat, facial swelling, sneezing, mouth sores, trouble swallowing, neck pain, neck stiffness, dental problem, voice change, postnasal drip, sinus pressure, tinnitus and ear discharge.   Eyes: Negative for photophobia, discharge, itching and visual disturbance.  Respiratory: Positive for shortness of breath  and wheezing. Negative for apnea, cough, hemoptysis, sputum production, choking, chest tightness and stridor.   Cardiovascular: Positive for orthopnea. Negative for chest pain, palpitations, claudication, leg swelling, syncope and PND.  Gastrointestinal: Negative for nausea, vomiting, abdominal pain, constipation, blood in stool and abdominal distention.  Genitourinary: Negative for dysuria, urgency, frequency, hematuria, flank pain, decreased urine volume and difficulty urinating.  Musculoskeletal: Positive for joint swelling and arthralgias. Negative for myalgias, back pain and gait problem.       Prednisone helped joint symptoms  Skin: Negative for color change, pallor and rash.  Neurological: Positive for dizziness, weakness, light-headedness and numbness. Negative for tremors, seizures, syncope, speech difficulty and headaches.  Hematological: Negative for adenopathy. Does not bruise/bleed easily.  Psychiatric/Behavioral: Positive for sleep disturbance. Negative for confusion and agitation. The patient is not nervous/anxious.        Objective:   Physical Exam  Filed Vitals:   07/20/12 1331  BP: 130/80  Pulse: 65  Temp: 98.2 F (36.8 C)  TempSrc: Oral  Height: 6' (1.829 m)  Weight: 246 lb (111.585 kg)  SpO2: 96%    Gen: Pleasant, well-nourished, in no distress,  normal affect Note cleft palate and abnormal speech pattern  ENT: No lesions,  mouth clear,  oropharynx clear, no postnasal drip  Neck: No JVD, no TMG, no carotid bruits  Lungs: No use of accessory muscles, no dullness to percussion, Dry rales and poor breath sounds  Cardiovascular: RRR, heart sounds normal, no murmur or gallops, no peripheral edema  Abdomen: soft and NT, no HSM,  BS normal  Musculoskeletal: No deformities, no cyanosis or clubbing  Neuro: alert, non focal  Skin: Warm, no lesions or rashes  CT chest 06/12/2012  Findings:  Mediastinum: Heart size is mildly enlarged. There is no significant   pericardial fluid, thickening or pericardial calcification. There  is atherosclerosis of the thoracic aorta, the great vessels of the  mediastinum and the coronary arteries, including calcified  atherosclerotic plaque in the left main, left anterior descending,  left circumflex and right coronary arteries. Calcifications of the  mitral annulus. Status post median sternotomy for aortic valve  replacement (there appears to be a stented bioprosthesis in  position). There is a large hiatal hernia. No pathologically  enlarged mediastinal or hilar lymph nodes. Please note that  accurate exclusion of hilar adenopathy is limited on noncontrast CT  scans.  Lungs/Pleura: Scattered throughout the lungs bilaterally (right  greater than left) there are patchy areas of peripheral predominant  ground-glass attenuation with some associated septal thickening and  architectural distortion. This is most pronounced in the right  lower lobe where there is some associated traction  bronchiectasis.  Sparing of the immediate subpleural interstitium is noted. No frank  honeycombing identified at this time. No acute consolidative air  space disease. No pleural effusions. No definite suspicious  appearing pulmonary nodules or masses are confidently identified,  however, the examination is significantly limited by a large amount  of patient respiratory motion.  Upper Abdomen: Status post cholecystectomy. Atherosclerosis.  Colonic diverticula are noted, without surrounding inflammatory  changes to suggest an acute diverticulitis in the visualized  portions of the upper abdomen at this time.  Musculoskeletal: Sternotomy wires. There are no aggressive  appearing lytic or blastic lesions noted in the visualized portions  of the skeleton.  IMPRESSION:  1. The appearance of the lung parenchyma is unusual, but is most  suggestive of interstitial lung disease. Given the pattern, in  particular, the subpleural sparing,  findings are favored to  represent cryptogenic organizing pneumonia (COP). The repeat high  resolution chest CT and 12 months may be useful to exclude temporal  progression of disease.  2. Atherosclerosis, including left main and three-vessel coronary  artery disease. Assessment for potential risk factor  modification, dietary therapy or pharmacologic therapy may be  warranted, if clinically indicated.  3. Status post median sternotomy for aortic valve replacement with  a stented bioprosthesis.  4. Large hiatal hernia.  5. Additional incidental findings, as above.   Serology: ANA pos but all other serology normal  PFTs:  DLCO 50% TLC 57% FeV1 63% FeV1/FVC 91%   Rheum Dr. Dierdre Forth believes condition is idiopathic. (scanned) 458m only down to 88%      Assessment & Plan:   BOOP (bronchiolitis obliterans with organizing pneumonia) BOOP/Cop.  No UIP or IPF   Steroid responsive  No autoimmune process Plan Stay on prednisone 30mg  a day for 7 days more then 20mg  a day for 14 days then 10mg  a day and STAY Stop N acetyl cysteine A referral to pulmonary rehab will be made Return 3 months     Updated Medication List Outpatient Encounter Prescriptions as of 07/20/2012  Medication Sig Dispense Refill  . aspirin 81 MG tablet Take 81 mg by mouth daily.        . metoprolol tartrate (LOPRESSOR) 25 MG tablet TAKE 1 TABLET BY MOUTH TWICE DAILY  180 tablet  2  . OVER THE COUNTER MEDICATION Super beta prostate - take 1 tablet by mouth twice daily      . pantoprazole (PROTONIX) 40 MG tablet TAKE 1 TABLET BY MOUTH DAILY  30 tablet  0  . pravastatin (PRAVACHOL) 40 MG tablet TAKE 1 TABLET BY MOUTH EVERY NIGHT AT BEDTIME .  30 tablet  0  . predniSONE (DELTASONE) 20 MG tablet 30mg  daily for 7 days then 20mg  daily for 2 weeks then 10mg  a day and stay      . traMADol (ULTRAM) 50 MG tablet Take 1 tablet (50 mg total) by mouth every 8 (eight) hours as needed for pain.  45 tablet  1  . [DISCONTINUED]  pravastatin (PRAVACHOL) 40 MG tablet Take 1 tablet (40 mg total) by mouth daily.  90 tablet  3  . [DISCONTINUED] predniSONE (DELTASONE) 20 MG tablet Take two daily for 10 more days then 1 1/2  A day and stay  60 tablet  1  . [DISCONTINUED] predniSONE (DELTASONE) 20 MG tablet Take 30 mg by mouth daily.      . [DISCONTINUED] Acetylcysteine 600 MG CAPS Take 1 capsule (600 mg total) by mouth 3 (three) times daily.  90 capsule  11   No facility-administered encounter medications on file as of 07/20/2012.

## 2012-07-21 NOTE — Assessment & Plan Note (Signed)
BOOP/Cop.  No UIP or IPF   Steroid responsive  No autoimmune process Plan Stay on prednisone 30mg  a day for 7 days more then 20mg  a day for 14 days then 10mg  a day and STAY Stop N acetyl cysteine A referral to pulmonary rehab will be made Return 3 months

## 2012-07-24 ENCOUNTER — Ambulatory Visit: Payer: Medicare Other | Admitting: Critical Care Medicine

## 2012-07-26 ENCOUNTER — Other Ambulatory Visit: Payer: Self-pay | Admitting: *Deleted

## 2012-07-26 MED ORDER — PRAVASTATIN SODIUM 40 MG PO TABS: ORAL_TABLET | ORAL | Status: DC

## 2012-07-31 ENCOUNTER — Encounter: Payer: Self-pay | Admitting: Cardiology

## 2012-07-31 ENCOUNTER — Ambulatory Visit (INDEPENDENT_AMBULATORY_CARE_PROVIDER_SITE_OTHER): Payer: Medicare Other | Admitting: Cardiology

## 2012-07-31 ENCOUNTER — Encounter: Payer: Self-pay | Admitting: Critical Care Medicine

## 2012-07-31 VITALS — BP 132/81 | HR 72 | Ht 72.0 in | Wt 247.8 lb

## 2012-07-31 DIAGNOSIS — E785 Hyperlipidemia, unspecified: Secondary | ICD-10-CM

## 2012-07-31 DIAGNOSIS — Z954 Presence of other heart-valve replacement: Secondary | ICD-10-CM

## 2012-07-31 DIAGNOSIS — J8409 Other alveolar and parieto-alveolar conditions: Secondary | ICD-10-CM

## 2012-07-31 DIAGNOSIS — R030 Elevated blood-pressure reading, without diagnosis of hypertension: Secondary | ICD-10-CM

## 2012-07-31 DIAGNOSIS — J8489 Other specified interstitial pulmonary diseases: Secondary | ICD-10-CM

## 2012-07-31 NOTE — Assessment & Plan Note (Signed)
Patient's dyspnea appears to be related to interstitial lung disease. Continue steroids. Managed by pulmonary.

## 2012-07-31 NOTE — Assessment & Plan Note (Signed)
Continue statin. 

## 2012-07-31 NOTE — Progress Notes (Signed)
HPI: Pleasant male for fu of AVR. Previously diagnosed with severe AS by echo. Cardiac catheterization in Sept 2011 revealed nonobstructive coronary disease, normal LV function and moderately severe aortic stenosis with a mean gradient of 34 mm of mercury. The patient then underwent aortic valve replacement on September 21 of 2011 with a pericardial tissue valve. Last echocardiogram performed in May of 2014. This showed normal LV function and a normal tissue aortic valve; mean gradient 12 mm of mercury; no aortic insufficiency. I last saw him in May of 2014. He was complaining of shortness of breath. We felt this was more likely a pulmonary process and his diuretics were discontinued. A chest CT was performed in May of 2014 and suggested interstitial lung disease. The patient seen by Dr. 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 Since I last saw him, he continues to have dyspnea on exertion but no orthopnea, PND, pedal edema or syncope or chest pain.   Current Outpatient Prescriptions  Medication Sig Dispense Refill  . aspirin 81 MG tablet Take 81 mg by mouth daily.        . metoprolol tartrate (LOPRESSOR) 25 MG tablet TAKE 1 TABLET BY MOUTH TWICE DAILY  180 tablet  2  . OVER THE COUNTER MEDICATION Super beta prostate - take 1 tablet by mouth twice daily      . pantoprazole (PROTONIX) 40 MG tablet TAKE 1 TABLET BY MOUTH DAILY  30 tablet  0  . pravastatin (PRAVACHOL) 40 MG tablet TAKE 1 TABLET BY MOUTH EVERY NIGHT AT BEDTIME .  14 tablet  0  . predniSONE (DELTASONE) 20 MG tablet 30mg  daily for 7 days then 20mg  daily for 2 weeks then 10mg  a day and stay      . traMADol (ULTRAM) 50 MG tablet Take 1 tablet (50 mg total) by mouth every 8 (eight) hours as needed for pain.  45 tablet  1  . [DISCONTINUED] tadalafil (CIALIS) 20 MG tablet Take 20 mg by mouth daily as needed. Take one about 1-2 hours before sexual intercourse        No current facility-administered medications for this visit.      Past Medical History  Diagnosis Date  . Hypertension   . Hyperlipidemia   . Aortic stenosis     AVR 2011  . Dermatitis     poison ivy  . Cleft palate   . Interstitial lung disease     Past Surgical History  Procedure Laterality Date  . Bilat cataract sx  2004  . Cholecystectomy  1999  . 7 surgeries for cleft palate  1938  . Oral surgery with bone graft from hip for cleft palate  1989  . Right groin hernia repair  1950's  . Rotator cuff tear, left shoulder    . Aortic valve replacement w/ pericardial tissue valve  10-15-09    Dr Barry Dienes    History   Social History  . Marital Status: Married    Spouse Name: N/A    Number of Children: N/A  . Years of Education: N/A   Occupational History  . Retired    Social History Main Topics  . Smoking status: Former Smoker -- 1.50 packs/day for 5 years    Types: Cigarettes, Cigars    Quit date: 09/08/1977  . Smokeless tobacco: Never Used  . Alcohol Use: No  . Drug Use: No  . Sexually Active: Not on file   Other Topics Concern  . Not on file  Social History Narrative  . No narrative on file    ROS: no fevers or chills, productive cough, hemoptysis, dysphasia, odynophagia, melena, hematochezia, dysuria, hematuria, rash, seizure activity, orthopnea, PND, pedal edema, claudication. Remaining systems are negative.  Physical Exam: Well-developed well-nourished in no acute distress.  Skin is warm and dry.  HEENT is normal.  Neck is supple.  Chest is clear to auscultation with normal expansion.  Cardiovascular exam is regular rate and rhythm. 2/6 systolic murmur left sternal border. No diastolic murmur. Abdominal exam nontender or distended. No masses palpated. Extremities show no edema. neuro grossly intact

## 2012-07-31 NOTE — Assessment & Plan Note (Signed)
Continue SBE prophylaxis. 

## 2012-07-31 NOTE — Patient Instructions (Addendum)
Your physician wants you to follow-up in: 6 MONTHS WITH DR CRENSHAW You will receive a reminder letter in the mail two months in advance. If you don't receive a letter, please call our office to schedule the follow-up appointment.  

## 2012-07-31 NOTE — Assessment & Plan Note (Signed)
Continue metoprolol. 

## 2012-08-01 ENCOUNTER — Other Ambulatory Visit: Payer: Self-pay | Admitting: Family Medicine

## 2012-08-02 ENCOUNTER — Encounter: Payer: Self-pay | Admitting: Internal Medicine

## 2012-08-03 ENCOUNTER — Ambulatory Visit (INDEPENDENT_AMBULATORY_CARE_PROVIDER_SITE_OTHER): Payer: Medicare Other | Admitting: Critical Care Medicine

## 2012-08-03 ENCOUNTER — Ambulatory Visit (HOSPITAL_BASED_OUTPATIENT_CLINIC_OR_DEPARTMENT_OTHER)
Admission: RE | Admit: 2012-08-03 | Discharge: 2012-08-03 | Disposition: A | Discharge status: Home / Self Care | Payer: Medicare Other | Source: Ambulatory Visit | Attending: Critical Care Medicine | Admitting: Critical Care Medicine

## 2012-08-03 ENCOUNTER — Encounter: Payer: Self-pay | Admitting: Critical Care Medicine

## 2012-08-03 ENCOUNTER — Other Ambulatory Visit: Payer: Self-pay | Admitting: Family Medicine

## 2012-08-03 VITALS — BP 102/70 | HR 72 | Temp 97.4°F | Ht 72.0 in | Wt 245.0 lb

## 2012-08-03 DIAGNOSIS — J8410 Pulmonary fibrosis, unspecified: Secondary | ICD-10-CM | POA: Diagnosis not present

## 2012-08-03 DIAGNOSIS — J8489 Other specified interstitial pulmonary diseases: Secondary | ICD-10-CM

## 2012-08-03 DIAGNOSIS — J8409 Other alveolar and parieto-alveolar conditions: Secondary | ICD-10-CM

## 2012-08-03 DIAGNOSIS — K449 Diaphragmatic hernia without obstruction or gangrene: Secondary | ICD-10-CM | POA: Diagnosis not present

## 2012-08-03 MED ORDER — PREDNISONE 20 MG PO TABS: ORAL_TABLET | ORAL | Status: DC

## 2012-08-03 NOTE — Assessment & Plan Note (Signed)
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

## 2012-08-03 NOTE — Patient Instructions (Addendum)
Increase prednisone to 40mg  a day ( 2 tab ) for 14days then 30mg  a day and stay (1 1/2) We will order oxygen from [facility] 2Liter rest 3Liter exertion An overnight oxygen test will be obtained We will find out what the hold up on pulmonary rehab is at this time Return 1 month

## 2012-08-03 NOTE — Progress Notes (Signed)
Subjective:    Patient ID: [name], male    DOB: 1934-07-08, 78 y.o.   MRN: [id number]  HPI Comments: Hx of weakness and stamina issues. If go for long distances, get light headed. Pred started at 40mg  /d 48hrs ago and helped. Pt also on fluid med and stopped a week ago Initially thought CHF but cardiac w/u neg  07/20/2012 At last ov we started prednisone Since last ov is better. No cough. No real chest pain No real wheeze.  Never had much mucus  08/03/2012 Chief Complaint  Patient presents with  . 6 Week Follow Up    Breathing hast gotten worse. Reports DOE and chest tightness with exertion. Denies coughing or wheezing.  Pt is worse with activity is dyspneic. Pt seems sl worse. No cough.No real wheeze.  No heart burn.   Gives out after a few feet. Not able to water the flowers.   Pred down to 20mg /d     Past Medical History  Diagnosis Date  . Hypertension   . Hyperlipidemia   . Aortic stenosis     AVR 2011  . Dermatitis     poison ivy  . Cleft palate   . Interstitial lung disease      Family History  Problem Relation Age of Onset  . Heart attack Mother   . Cancer Father     lung and stomach  . Heart attack Father   . Cancer Sister     bone  . Diabetes Sister   . Heart murmur Brother   . Hypertension Brother   . Hyperlipidemia Brother   . Heart attack Brother      History   Social History  . Marital Status: Married    Spouse Name: N/A    Number of Children: N/A  . Years of Education: N/A   Occupational History  . Retired    Social History Main Topics  . Smoking status: Former Smoker -- 1.50 packs/day for 5 years    Types: Cigarettes, Cigars    Quit date: 09/08/1977  . Smokeless tobacco: Never Used  . Alcohol Use: No  . Drug Use: No  . Sexually Active: Not on file   Other Topics Concern  . Not on file   Social History Narrative  . No narrative on file     No Known Allergies   Outpatient Prescriptions Prior to Visit  Medication  Sig Dispense Refill  . aspirin 81 MG tablet Take 81 mg by mouth daily.        . metoprolol tartrate (LOPRESSOR) 25 MG tablet TAKE 1 TABLET BY MOUTH TWICE DAILY  180 tablet  2  . OVER THE COUNTER MEDICATION Super beta prostate - take 1 tablet by mouth twice daily      . pantoprazole (PROTONIX) 40 MG tablet TAKE 1 TABLET BY MOUTH DAILY  30 tablet  0  . pravastatin (PRAVACHOL) 40 MG tablet TAKE 1 TABLET BY MOUTH EVERY NIGHT AT BEDTIME .  14 tablet  0  . traMADol (ULTRAM) 50 MG tablet Take 1 tablet (50 mg total) by mouth every 8 (eight) hours as needed for pain.  45 tablet  1  . predniSONE (DELTASONE) 20 MG tablet 30mg  daily for 7 days then 20mg  daily for 2 weeks then 10mg  a day and stay       No facility-administered medications prior to visit.      Review of Systems  Constitutional: Positive for activity change, appetite change and fatigue. Negative for  chills, diaphoresis and unexpected weight change.  HENT: Negative for hearing loss, nosebleeds, congestion, facial swelling, sneezing, mouth sores, trouble swallowing, neck stiffness, dental problem, voice change, postnasal drip, sinus pressure, tinnitus and ear discharge.   Eyes: Negative for photophobia, discharge, itching and visual disturbance.  Respiratory: Negative for apnea, cough, choking, chest tightness and stridor.   Cardiovascular: Negative for palpitations.  Gastrointestinal: Negative for nausea, constipation, blood in stool and abdominal distention.  Genitourinary: Negative for dysuria, urgency, frequency, hematuria, flank pain, decreased urine volume and difficulty urinating.  Musculoskeletal: Positive for joint swelling and arthralgias. Negative for myalgias, back pain and gait problem.       Prednisone helped joint symptoms  Skin: Negative for color change and pallor.  Neurological: Positive for dizziness, weakness, light-headedness and numbness. Negative for tremors, seizures, syncope and speech difficulty.  Hematological:  Negative for adenopathy. Does not bruise/bleed easily.  Psychiatric/Behavioral: Positive for sleep disturbance. Negative for confusion and agitation. The patient is not nervous/anxious.        Objective:   Physical Exam  Filed Vitals:   08/03/12 0943  BP: 102/70  Pulse: 72  Temp: 97.4 F (36.3 C)  TempSrc: Oral  Height: 6' (1.829 m)  Weight: 245 lb (111.131 kg)  SpO2: 98%    Gen: Pleasant, well-nourished, in no distress,  normal affect Note cleft palate and abnormal speech pattern  ENT: No lesions,  mouth clear,  oropharynx clear, no postnasal drip  Neck: No JVD, no TMG, no carotid bruits  Lungs: No use of accessory muscles, no dullness to percussion, Dry rales and poor breath sounds  Cardiovascular: RRR, heart sounds normal, no murmur or gallops, no peripheral edema  Abdomen: soft and NT, no HSM,  BS normal  Musculoskeletal: No deformities, no cyanosis or clubbing  Neuro: alert, non focal  Skin: Warm, no lesions or rashes  CT chest 06/12/2012  Findings:  Mediastinum: Heart size is mildly enlarged. There is no significant  pericardial fluid, thickening or pericardial calcification. There  is atherosclerosis of the thoracic aorta, the great vessels of the  mediastinum and the coronary arteries, including calcified  atherosclerotic plaque in the left main, left anterior descending,  left circumflex and right coronary arteries. Calcifications of the  mitral annulus. Status post median sternotomy for aortic valve  replacement (there appears to be a stented bioprosthesis in  position). There is a large hiatal hernia. No pathologically  enlarged mediastinal or hilar lymph nodes. Please note that  accurate exclusion of hilar adenopathy is limited on noncontrast CT  scans.  Lungs/Pleura: Scattered throughout the lungs bilaterally (right  greater than left) there are patchy areas of peripheral predominant  ground-glass attenuation with some associated septal thickening  and  architectural distortion. This is most pronounced in the right  lower lobe where there is some associated traction bronchiectasis.  Sparing of the immediate subpleural interstitium is noted. No frank  honeycombing identified at this time. No acute consolidative air  space disease. No pleural effusions. No definite suspicious  appearing pulmonary nodules or masses are confidently identified,  however, the examination is significantly limited by a large amount  of patient respiratory motion.  Upper Abdomen: Status post cholecystectomy. Atherosclerosis.  Colonic diverticula are noted, without surrounding inflammatory  changes to suggest an acute diverticulitis in the visualized  portions of the upper abdomen at this time.  Musculoskeletal: Sternotomy wires. There are no aggressive  appearing lytic or blastic lesions noted in the visualized portions  of the skeleton.  IMPRESSION:  1.  The appearance of the lung parenchyma is unusual, but is most  suggestive of interstitial lung disease. Given the pattern, in  particular, the subpleural sparing, findings are favored to  represent cryptogenic organizing pneumonia (COP). The repeat high  resolution chest CT and 12 months may be useful to exclude temporal  progression of disease.  2. Atherosclerosis, including left main and three-vessel coronary  artery disease. Assessment for potential risk factor  modification, dietary therapy or pharmacologic therapy may be  warranted, if clinically indicated.  3. Status post median sternotomy for aortic valve replacement with  a stented bioprosthesis.  4. Large hiatal hernia.  5. Additional incidental findings, as above.   Serology: ANA pos but all other serology normal  PFTs:  DLCO 50% TLC 57% FeV1 63% FeV1/FVC 91%   Rheum Dr. Dierdre Forth believes condition is idiopathic. (scanned) 428m only down to 88%  Dg Chest 2 View  08/03/2012   *RADIOLOGY REPORT*  Clinical Data: Pulmonary fibrosis  CHEST  - 2 VIEW  Comparison: Chest radiograph Jun 09, 2012 and Jun 13, 2012 chest CT.  Findings: Widespread interstitial fibrotic change, more on the right than on the left, remain stable.  No frank consolidation. There is no new opacity.  Heart is upper normal in size with normal pulmonary vascularity. There is a large hiatal type hernia.  There is a prosthetic mitral valve.  No adenopathy.  IMPRESSION: Stable chest.  Widespread interstitial fibrotic change, more severe on the right than on the left.  Large hiatal type hernia.  No consolidation.   Original Report Authenticated By: Bretta Bang, M.D.        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 aspirin 81 MG tablet Take 81 mg by mouth daily.        . metoprolol tartrate (LOPRESSOR) 25 MG tablet TAKE 1 TABLET BY MOUTH TWICE DAILY  180 tablet  2  . OVER THE COUNTER MEDICATION Super beta prostate - take 1 tablet by mouth twice daily      . pantoprazole (PROTONIX) 40 MG tablet TAKE 1 TABLET BY MOUTH DAILY  30 tablet  0  . pravastatin (PRAVACHOL) 40 MG tablet TAKE 1 TABLET BY MOUTH EVERY NIGHT AT BEDTIME .  14 tablet  0  . predniSONE (DELTASONE) 20 MG tablet 40mg  daily (two) for two weeks then 30mg  daily and stay (1 1/2)      . traMADol (ULTRAM) 50 MG tablet Take 1 tablet (50 mg total) by mouth every 8 (eight) hours as needed for pain.  45 tablet  1  . [DISCONTINUED] predniSONE (DELTASONE) 20 MG tablet 30mg  daily for 7 days then 20mg  daily for 2 weeks then 10mg  a day and stay        No facility-administered encounter medications on file as of 08/03/2012.

## 2012-08-03 NOTE — Progress Notes (Signed)
Quick Note:  Notify the patient that the Xray is stable and no pneumonia or progression of scarring seen No change in medications are recommended. Continue current meds as prescribed at last office visit ______

## 2012-08-04 NOTE — Progress Notes (Signed)
Quick Note:  Called, spoke with pt. Informed him of cxr results and recs per Dr. Wright. He verbalized understanding and voiced no further questions or concerns at this time. ______ 

## 2012-08-07 ENCOUNTER — Ambulatory Visit (INDEPENDENT_AMBULATORY_CARE_PROVIDER_SITE_OTHER): Payer: Medicare Other | Admitting: Family Medicine

## 2012-08-07 ENCOUNTER — Encounter: Payer: Self-pay | Admitting: Family Medicine

## 2012-08-07 VITALS — BP 99/66 | HR 71 | Wt 256.0 lb

## 2012-08-07 DIAGNOSIS — E785 Hyperlipidemia, unspecified: Secondary | ICD-10-CM

## 2012-08-07 DIAGNOSIS — Z5181 Encounter for therapeutic drug level monitoring: Secondary | ICD-10-CM

## 2012-08-07 DIAGNOSIS — Z79899 Other long term (current) drug therapy: Secondary | ICD-10-CM | POA: Diagnosis not present

## 2012-08-07 LAB — HEPATIC FUNCTION PANEL
ALT: 17 U/L (ref 0–53)
AST: 15 U/L (ref 0–37)
Bilirubin, Direct: 0.1 mg/dL (ref 0.0–0.3)
Indirect Bilirubin: 0.6 mg/dL (ref 0.0–0.9)
Total Protein: 6.5 g/dL (ref 6.0–8.3)

## 2012-08-07 LAB — LIPID PANEL
Cholesterol: 180 mg/dL (ref 0–200)
Triglycerides: 168 mg/dL — ABNORMAL HIGH (ref <150)
VLDL: 34 mg/dL (ref 0–40)

## 2012-08-07 MED ORDER — PRAVASTATIN SODIUM 40 MG PO TABS: ORAL_TABLET | ORAL | Status: DC

## 2012-08-07 NOTE — Progress Notes (Signed)
CC: [name] is a 78 y.o. male is here for f/u labs   Subjective: HPI:  Patient presents for followup hyperlipidemia: Has been taking Pravachol daily basis without missed doses denies right upper quadrant pain, skin or scleral discoloration, myalgias. He tells me he's been using his ever since aortic valve replacement in 2011. We do not have a cholesterol panel within the last year on record. His shortness of breath has improved since starting prednisone and oxygen he is able to walk around in the community with greater ease. He denies chest pain, limb claudication, rest pain of the extremities, motor or sensory disturbances.   Review Of Systems Outlined In HPI  Past Medical History  Diagnosis Date  . Hypertension   . Hyperlipidemia   . Aortic stenosis     AVR 2011  . Dermatitis     poison ivy  . Cleft palate   . Interstitial lung disease      Family History  Problem Relation Age of Onset  . Heart attack Mother   . Cancer Father     lung and stomach  . Heart attack Father   . Cancer Sister     bone  . Diabetes Sister   . Heart murmur Brother   . Hypertension Brother   . Hyperlipidemia Brother   . Heart attack Brother      History  Substance Use Topics  . Smoking status: Former Smoker -- 1.50 packs/day for 5 years    Types: Cigarettes, Cigars    Quit date: 09/08/1977  . Smokeless tobacco: Never Used  . Alcohol Use: No     Objective: Filed Vitals:   08/07/12 0927  BP: 99/66  Pulse: 71    General: Alert and Oriented, No Acute Distress HEENT: Pupils equal, round, reactive to light. Conjunctivae clear.  Moist mucous membranes Lungs: Clear to auscultation bilaterally, no wheezing/ronchi/rales.  Comfortable work of breathing. Good air movement. Cardiac: Regular rate and rhythm. Normal S1/S2.  No murmurs, rubs, nor gallops.  No carotid bruit Abdomen: Obese soft nontender Extremities: No peripheral edema.  Strong peripheral pulses.  Mental Status: No  depression, anxiety, nor agitation. Skin: Warm and dry.  Assessment & Plan: Khadar was seen today for f/u labs.  Diagnoses and associated orders for this visit:  HYPERLIPIDEMIA - Lipid panel - Hepatic function panel - pravastatin (PRAVACHOL) 40 MG tablet; TAKE 1 TABLET BY MOUTH EVERY NIGHT AT BEDTIME .  Encounter for monitoring statin therapy - Lipid panel - Hepatic function panel    Hyperlipidemia: Clinically controlled however he is due for LDL and liver function check. He is fasting today Will obtain. We discussed diet and exercise interventions to also help lower the LDL cholesterol  Return in about 3 months (around 11/07/2012) for Cholesterol.

## 2012-08-08 ENCOUNTER — Encounter: Payer: Self-pay | Admitting: Family Medicine

## 2012-08-13 ENCOUNTER — Other Ambulatory Visit: Payer: Self-pay | Admitting: Family Medicine

## 2012-08-15 ENCOUNTER — Telehealth: Payer: Self-pay | Admitting: Critical Care Medicine

## 2012-08-15 DIAGNOSIS — J8489 Other specified interstitial pulmonary diseases: Secondary | ICD-10-CM

## 2012-08-15 NOTE — Telephone Encounter (Signed)
Call the pt and tell him ONO normal but stay on oxygen for now

## 2012-08-16 ENCOUNTER — Other Ambulatory Visit: Payer: Self-pay | Admitting: *Deleted

## 2012-08-16 ENCOUNTER — Encounter: Payer: Self-pay | Admitting: Critical Care Medicine

## 2012-08-16 MED ORDER — PREDNISONE 10 MG PO TABS: 30.0000 mg | ORAL_TABLET | Freq: Every day | ORAL | Status: DC

## 2012-08-16 NOTE — Telephone Encounter (Signed)
Called, spoke with pt.  Informed him of ONO results and recs per Dr. Delford Field.  He verbalized understanding and voiced no further questions or concerns at this time.  He is aware of pending OV with PW on Aug 1 at [facility].

## 2012-08-24 ENCOUNTER — Encounter: Payer: Self-pay | Admitting: Critical Care Medicine

## 2012-08-25 ENCOUNTER — Encounter: Payer: Self-pay | Admitting: Critical Care Medicine

## 2012-08-25 ENCOUNTER — Ambulatory Visit (INDEPENDENT_AMBULATORY_CARE_PROVIDER_SITE_OTHER): Payer: Medicare Other | Admitting: Critical Care Medicine

## 2012-08-25 VITALS — BP 144/78 | HR 68 | Temp 97.1°F | Ht 72.0 in | Wt 252.4 lb

## 2012-08-25 DIAGNOSIS — J8409 Other alveolar and parieto-alveolar conditions: Secondary | ICD-10-CM | POA: Diagnosis not present

## 2012-08-25 DIAGNOSIS — J8489 Other specified interstitial pulmonary diseases: Secondary | ICD-10-CM

## 2012-08-25 MED ORDER — PREDNISONE 10 MG PO TABS: 20.0000 mg | ORAL_TABLET | Freq: Every day | ORAL | Status: DC

## 2012-08-25 NOTE — Progress Notes (Signed)
Subjective:    Patient ID: [name], male    DOB: 10-04-1934, 78 y.o.   MRN: [id number]  HPI  ROV>>> 08/25/12 CC Breathing fine. Would like pulmonary rehab referral. Hx of Bronchiolitis obliterans organized pneumonia with progression of interstitial fibrosis. It has been shown that he develops hypoxemia after steroids tapered so at his last visit he received increase prednisone back to 40 mg a day with a very slow taper. Is currently on a 30mg  taper of prednisone before tapering it to 15mg  dose long term. He is on home oxygen therapy 2 L rest 3 L exertion - doing well on this. Breathing is fine and he feels energized. Has attempted to do pulmonary rehab but it feel through the last time. Would like another referral.  Past Medical History  Diagnosis Date  . Hypertension   . Hyperlipidemia   . Aortic stenosis     AVR 2011  . Dermatitis     poison ivy  . Cleft palate   . Interstitial lung disease      Family History  Problem Relation Age of Onset  . Heart attack Mother   . Cancer Father     lung and stomach  . Heart attack Father   . Cancer Sister     bone  . Diabetes Sister   . Heart murmur Brother   . Hypertension Brother   . Hyperlipidemia Brother   . Heart attack Brother      History   Social History  . Marital Status: Married    Spouse Name: N/A    Number of Children: N/A  . Years of Education: N/A   Occupational History  . Retired    Social History Main Topics  . Smoking status: Former Smoker -- 1.50 packs/day for 5 years    Types: Cigarettes, Cigars    Quit date: 09/08/1977  . Smokeless tobacco: Never Used  . Alcohol Use: No  . Drug Use: No  . Sexually Active: Not on file   Other Topics Concern  . Not on file   Social History Narrative  . No narrative on file     No Known Allergies   Outpatient Prescriptions Prior to Visit  Medication Sig Dispense Refill  . aspirin 81 MG tablet Take 81 mg by mouth daily.        . metoprolol tartrate  (LOPRESSOR) 25 MG tablet TAKE 1 TABLET BY MOUTH TWICE DAILY  180 tablet  0  . OVER THE COUNTER MEDICATION Super beta prostate - take 1 tablet by mouth twice daily      . pantoprazole (PROTONIX) 40 MG tablet TAKE 1 TABLET BY MOUTH EVERY DAY  30 tablet  0  . pravastatin (PRAVACHOL) 40 MG tablet TAKE 1 TABLET BY MOUTH EVERY NIGHT AT BEDTIME .  90 tablet  1  . traMADol (ULTRAM) 50 MG tablet Take 1 tablet (50 mg total) by mouth every 8 (eight) hours as needed for pain.  45 tablet  1  . predniSONE (DELTASONE) 10 MG tablet Take 3 tablets (30 mg total) by mouth daily.  90 tablet  2  . predniSONE (DELTASONE) 20 MG tablet 40mg  daily (two) for two weeks then 30mg  daily and stay (1 1/2)       No facility-administered medications prior to visit.       Review of Systems  Constitutional: Negative for fever, chills, diaphoresis and fatigue.  HENT: Positive for rhinorrhea. Negative for nosebleeds and postnasal drip.   Respiratory: Positive for shortness  of breath. Negative for cough, chest tightness and wheezing.        Objective:   Physical Exam  VITAL SIGNS:  BP 144/78  Pulse 68  Temp(Src) 97.1 F (36.2 C) (Oral)  Ht 6' (1.829 m)  Wt 252 lb 6.4 oz (114.488 kg)  BMI 34.22 kg/m2  SpO2 99%  HEENT: Head is normocephalic and atraumatic. Extraocular muscles are intact. Pupils are equal, round, and reactive to light and accommodation. Nares appeared normal. Mouth is well hydrated and without lesions. Mucous membranes are moist. Posterior pharynx clear of any exudate or lesions. No tonsils. NECK: Supple. No carotid bruits. No lymphadenopathy or thyromegaly. LUNGS: Clear to auscultation. Course breath sounds b/l lower lobes. No wheezing, rhonchi or rales. HEART: Regular rate and rhythm without murmur.  ABDOMEN: Soft, nontender, and nondistended. Positive bowel sounds. No hepatosplenomegaly was noted.  EXTREMITIES: Without any cyanosis, clubbing, rash, lesions or edema.  NEUROLOGIC: Cranial nerves II  through XII are grossly intact.  SKIN: No ulceration or induration present.       Assessment & Plan:   BOOP (bronchiolitis obliterans with organizing pneumonia) Boop/COP Serology: ANA pos but all other serology normal  PFTs:  DLCO 50% TLC 57% FeV1 63% FeV1/FVC 91%   Rheum Dr. Dierdre Forth believes condition is idiopathic. (scanned) 435m only down to 88% 08/03/2012 walk on RA sats to 85%>>oxygen ordered 2L rest and 3L exertion [facility] ordered ONO on RA 08/10/12: no significant desats  Improved with oxygen therapy and pred pulse Plan Taper to 20mg  /d prednisone and hold Stay on oxygen Rehab will be ordered   Updated Medication List Outpatient Encounter Prescriptions as of 08/25/2012  Medication Sig Dispense Refill  . aspirin 81 MG tablet Take 81 mg by mouth daily.        . metoprolol tartrate (LOPRESSOR) 25 MG tablet TAKE 1 TABLET BY MOUTH TWICE DAILY  180 tablet  0  . OVER THE COUNTER MEDICATION Super beta prostate - take 1 tablet by mouth twice daily      . pantoprazole (PROTONIX) 40 MG tablet TAKE 1 TABLET BY MOUTH EVERY DAY  30 tablet  0  . pravastatin (PRAVACHOL) 40 MG tablet TAKE 1 TABLET BY MOUTH EVERY NIGHT AT BEDTIME .  90 tablet  1  . predniSONE (DELTASONE) 10 MG tablet Take 2 tablets (20 mg total) by mouth daily.  90 tablet  2  . traMADol (ULTRAM) 50 MG tablet Take 1 tablet (50 mg total) by mouth every 8 (eight) hours as needed for pain.  45 tablet  1  . [DISCONTINUED] predniSONE (DELTASONE) 10 MG tablet Take 3 tablets (30 mg total) by mouth daily.  90 tablet  2  . [DISCONTINUED] predniSONE (DELTASONE) 20 MG tablet 40mg  daily (two) for two weeks then 30mg  daily and stay (1 1/2)       No facility-administered encounter medications on file as of 08/25/2012.

## 2012-08-25 NOTE — Patient Instructions (Addendum)
Reduce prednisone to 20mg  daily and stay No other medication changes Stay on oxygen We will check into why rehab not ordered yet Return 2 months

## 2012-08-27 NOTE — Assessment & Plan Note (Signed)
Boop/COP Serology: ANA pos but all other serology normal  PFTs:  DLCO 50% TLC 57% FeV1 63% FeV1/FVC 91%   Rheum Dr. Dierdre Forth believes condition is idiopathic. (scanned) 436m only down to 88% 08/03/2012 walk on RA sats to 85%>>oxygen ordered 2L rest and 3L exertion [facility] ordered ONO on RA 08/10/12: no significant desats  Improved with oxygen therapy and pred pulse Plan Taper to 20mg  /d prednisone and hold Stay on oxygen Rehab will be ordered

## 2012-08-30 ENCOUNTER — Encounter (HOSPITAL_COMMUNITY): Payer: Self-pay

## 2012-08-30 ENCOUNTER — Encounter (HOSPITAL_COMMUNITY)
Admission: RE | Admit: 2012-08-30 | Discharge: 2012-08-30 | Disposition: A | Discharge status: Home / Self Care | Payer: Medicare Other | Source: Ambulatory Visit | Attending: Critical Care Medicine | Admitting: Critical Care Medicine

## 2012-08-30 DIAGNOSIS — I359 Nonrheumatic aortic valve disorder, unspecified: Secondary | ICD-10-CM | POA: Diagnosis not present

## 2012-08-30 DIAGNOSIS — I4891 Unspecified atrial fibrillation: Secondary | ICD-10-CM | POA: Diagnosis not present

## 2012-08-30 DIAGNOSIS — J8410 Pulmonary fibrosis, unspecified: Secondary | ICD-10-CM | POA: Insufficient documentation

## 2012-08-30 DIAGNOSIS — I079 Rheumatic tricuspid valve disease, unspecified: Secondary | ICD-10-CM | POA: Insufficient documentation

## 2012-08-30 DIAGNOSIS — I10 Essential (primary) hypertension: Secondary | ICD-10-CM | POA: Insufficient documentation

## 2012-08-30 DIAGNOSIS — Z5189 Encounter for other specified aftercare: Secondary | ICD-10-CM | POA: Diagnosis not present

## 2012-08-30 DIAGNOSIS — K449 Diaphragmatic hernia without obstruction or gangrene: Secondary | ICD-10-CM | POA: Diagnosis not present

## 2012-08-30 DIAGNOSIS — I379 Nonrheumatic pulmonary valve disorder, unspecified: Secondary | ICD-10-CM | POA: Insufficient documentation

## 2012-08-30 NOTE — Progress Notes (Signed)
Mr. David arrived today in [facility] and [facility] Rehab for orientation to Pulmonary Rehab.  He was transported from [facility] via wheel chair.  He is carrying his portable oxygen.  He is not using it at this time.  Color good , skin warm and dry, Oriented to time , place.  His medical history and medications were reviewed.  Heart rate is regular, breath sounds clear.  Grips equal and strong.  Distal pulse + 2 bilateral, 1/2 -1 + pitting edema bilateral.  He states he does not take diuretic.  He does use salt, also drinks 2-3 sodas per day.  We discussed this and he will be seen by nutritionist during program. We will also monitor his weight closely. Demonstration and practice of PLB using pulse oximeter.  Patient able to return demonstration satisfactorily. Safety and hand hygiene in the exercise area reviewed with patient.  Patient voices understanding. Department expectations reviewed with patient and achievable goals were set.  He show a lot of enthusiasm about attending the program and we look forward to working with this nice gentleman.  He will have 6 min walk test done on 08/31/12 and begin exercise on 09/05/12.    10:30 to11:05 am   Cathie Olden RN

## 2012-08-31 ENCOUNTER — Encounter (HOSPITAL_COMMUNITY)
Admission: RE | Admit: 2012-08-31 | Discharge: 2012-08-31 | Disposition: A | Discharge status: Home / Self Care | Payer: Medicare Other | Source: Ambulatory Visit | Attending: Critical Care Medicine | Admitting: Critical Care Medicine

## 2012-08-31 ENCOUNTER — Encounter (HOSPITAL_COMMUNITY): Payer: Medicare Other

## 2012-08-31 ENCOUNTER — Encounter: Payer: Self-pay | Admitting: Critical Care Medicine

## 2012-08-31 NOTE — Progress Notes (Signed)
Pt here for 6 min walk test today

## 2012-09-01 NOTE — Telephone Encounter (Signed)
Will forward to Dr. Maple Hudson as RA has already left and working [facility] ICU this afternoon. Please advise Dr. Maple Hudson thanks

## 2012-09-01 NOTE — Telephone Encounter (Signed)
Pt is wanting to know what OTC allergy relief would be okay and safe to take with her current meds. Pt does not want to take something to interfere with any of her meds.  Please advise Dr Vassie Loll, for you are doc of the morning. Thanks. No Known Allergies  Pt aware that Singles vaccine will be addressed by Dr Delford Field upon his return.

## 2012-09-01 NOTE — Telephone Encounter (Signed)
Per CDY:  Ok to try claritin/loratadine or allegra/fexofinadine.  Pt email sent with recs back to pt.  Will forward msg to PW to address shingles question upon return to office on Aug 18.  Pt aware via email.

## 2012-09-05 ENCOUNTER — Encounter (HOSPITAL_COMMUNITY)
Admission: RE | Admit: 2012-09-05 | Discharge: 2012-09-05 | Disposition: A | Discharge status: Home / Self Care | Payer: Medicare Other | Source: Ambulatory Visit | Attending: Critical Care Medicine | Admitting: Critical Care Medicine

## 2012-09-05 NOTE — Progress Notes (Signed)
First day of exercise for [name] in Pulmonary Rehab. Oriented to equipment use and safety.  Demonstration and practice of PLB on each exercise station.  Oxygen saturations were stable above 90% on oxygen @3L /M with exercise.   Cathie Olden RN

## 2012-09-07 ENCOUNTER — Other Ambulatory Visit: Payer: Self-pay | Admitting: Family Medicine

## 2012-09-07 ENCOUNTER — Encounter (HOSPITAL_COMMUNITY)
Admission: RE | Admit: 2012-09-07 | Discharge: 2012-09-07 | Disposition: A | Discharge status: Home / Self Care | Payer: Medicare Other | Source: Ambulatory Visit | Attending: Critical Care Medicine | Admitting: Critical Care Medicine

## 2012-09-12 ENCOUNTER — Encounter (HOSPITAL_COMMUNITY): Payer: Medicare Other

## 2012-09-14 ENCOUNTER — Encounter (HOSPITAL_COMMUNITY)
Admission: RE | Admit: 2012-09-14 | Discharge: 2012-09-14 | Disposition: A | Discharge status: Home / Self Care | Payer: Medicare Other | Source: Ambulatory Visit | Attending: Critical Care Medicine | Admitting: Critical Care Medicine

## 2012-09-19 ENCOUNTER — Encounter (HOSPITAL_COMMUNITY)
Admission: RE | Admit: 2012-09-19 | Discharge: 2012-09-19 | Disposition: A | Discharge status: Home / Self Care | Payer: Medicare Other | Source: Ambulatory Visit | Attending: Critical Care Medicine | Admitting: Critical Care Medicine

## 2012-09-19 ENCOUNTER — Encounter (HOSPITAL_COMMUNITY): Payer: Self-pay

## 2012-09-19 NOTE — Progress Notes (Deleted)
Subjective:      Patient ID: [name] is a 78 y.o. male.  Chief Complaint: HPI {Common ambulatory SmartLinks:19316} ROS    Objective:    Physical Exam  Lab Review:  {Recent labs:19471::"not applicable"}    Assessment:     No diagnosis found.   Plan:     ***

## 2012-09-19 NOTE — Progress Notes (Signed)
I have reviewed a Home Exercise Program with the patient.  [name] will be walking 3 days a week for 15 minutes.  The patient was advised to add 5 minutes onto the walking time every 2 weeks.  The patient was advised to perform his band exercises 2 times a week.  We reviewed exercise guidelines, target heart rate during exercise, oxygen use, weather, home pulse oximeter, endpoints for exercise, and goals.  Patient has stated that they understand and are encouraged to come to me with any questions.

## 2012-09-21 ENCOUNTER — Encounter (HOSPITAL_COMMUNITY)
Admission: RE | Admit: 2012-09-21 | Discharge: 2012-09-21 | Disposition: A | Discharge status: Home / Self Care | Payer: Medicare Other | Source: Ambulatory Visit | Attending: Critical Care Medicine | Admitting: Critical Care Medicine

## 2012-09-26 ENCOUNTER — Encounter (HOSPITAL_COMMUNITY)
Admission: RE | Admit: 2012-09-26 | Discharge: 2012-09-26 | Disposition: A | Discharge status: Home / Self Care | Payer: Medicare Other | Source: Ambulatory Visit | Attending: Critical Care Medicine | Admitting: Critical Care Medicine

## 2012-09-26 DIAGNOSIS — I4891 Unspecified atrial fibrillation: Secondary | ICD-10-CM | POA: Diagnosis not present

## 2012-09-26 DIAGNOSIS — I10 Essential (primary) hypertension: Secondary | ICD-10-CM | POA: Diagnosis not present

## 2012-09-26 DIAGNOSIS — I359 Nonrheumatic aortic valve disorder, unspecified: Secondary | ICD-10-CM | POA: Insufficient documentation

## 2012-09-26 DIAGNOSIS — Z5189 Encounter for other specified aftercare: Secondary | ICD-10-CM | POA: Insufficient documentation

## 2012-09-26 DIAGNOSIS — J8410 Pulmonary fibrosis, unspecified: Secondary | ICD-10-CM | POA: Insufficient documentation

## 2012-09-26 DIAGNOSIS — I079 Rheumatic tricuspid valve disease, unspecified: Secondary | ICD-10-CM | POA: Diagnosis not present

## 2012-09-26 DIAGNOSIS — K449 Diaphragmatic hernia without obstruction or gangrene: Secondary | ICD-10-CM | POA: Insufficient documentation

## 2012-09-26 DIAGNOSIS — I379 Nonrheumatic pulmonary valve disorder, unspecified: Secondary | ICD-10-CM | POA: Insufficient documentation

## 2012-09-27 DIAGNOSIS — Z23 Encounter for immunization: Secondary | ICD-10-CM | POA: Diagnosis not present

## 2012-09-28 ENCOUNTER — Encounter (HOSPITAL_COMMUNITY)
Admission: RE | Admit: 2012-09-28 | Discharge: 2012-09-28 | Disposition: A | Discharge status: Home / Self Care | Payer: Medicare Other | Source: Ambulatory Visit | Attending: Critical Care Medicine | Admitting: Critical Care Medicine

## 2012-10-03 ENCOUNTER — Encounter (HOSPITAL_COMMUNITY)
Admission: RE | Admit: 2012-10-03 | Discharge: 2012-10-03 | Disposition: A | Discharge status: Home / Self Care | Payer: Medicare Other | Source: Ambulatory Visit | Attending: Critical Care Medicine | Admitting: Critical Care Medicine

## 2012-10-05 ENCOUNTER — Encounter (HOSPITAL_COMMUNITY)
Admission: RE | Admit: 2012-10-05 | Discharge: 2012-10-05 | Disposition: A | Discharge status: Home / Self Care | Payer: Medicare Other | Source: Ambulatory Visit | Attending: Critical Care Medicine | Admitting: Critical Care Medicine

## 2012-10-10 ENCOUNTER — Encounter (HOSPITAL_COMMUNITY)
Admission: RE | Admit: 2012-10-10 | Discharge: 2012-10-10 | Disposition: A | Discharge status: Home / Self Care | Payer: Medicare Other | Source: Ambulatory Visit | Attending: Critical Care Medicine | Admitting: Critical Care Medicine

## 2012-10-12 ENCOUNTER — Encounter (HOSPITAL_COMMUNITY)
Admission: RE | Admit: 2012-10-12 | Discharge: 2012-10-12 | Disposition: A | Discharge status: Home / Self Care | Payer: Medicare Other | Source: Ambulatory Visit | Attending: Critical Care Medicine | Admitting: Critical Care Medicine

## 2012-10-17 ENCOUNTER — Encounter (HOSPITAL_COMMUNITY)
Admission: RE | Admit: 2012-10-17 | Discharge: 2012-10-17 | Disposition: A | Discharge status: Home / Self Care | Payer: Medicare Other | Source: Ambulatory Visit | Attending: Critical Care Medicine | Admitting: Critical Care Medicine

## 2012-10-19 ENCOUNTER — Encounter (HOSPITAL_COMMUNITY)
Admission: RE | Admit: 2012-10-19 | Discharge: 2012-10-19 | Disposition: A | Discharge status: Home / Self Care | Payer: Medicare Other | Source: Ambulatory Visit | Attending: Critical Care Medicine | Admitting: Critical Care Medicine

## 2012-10-24 ENCOUNTER — Encounter (HOSPITAL_COMMUNITY)
Admission: RE | Admit: 2012-10-24 | Discharge: 2012-10-24 | Disposition: A | Discharge status: Home / Self Care | Payer: Medicare Other | Source: Ambulatory Visit | Attending: Critical Care Medicine | Admitting: Critical Care Medicine

## 2012-10-25 ENCOUNTER — Ambulatory Visit (INDEPENDENT_AMBULATORY_CARE_PROVIDER_SITE_OTHER): Payer: Medicare Other | Admitting: Family Medicine

## 2012-10-25 ENCOUNTER — Encounter: Payer: Self-pay | Admitting: Family Medicine

## 2012-10-25 VITALS — BP 123/68 | HR 66 | Temp 97.9°F

## 2012-10-25 DIAGNOSIS — Z23 Encounter for immunization: Secondary | ICD-10-CM

## 2012-10-25 NOTE — Progress Notes (Addendum)
I was present for all necessary aspects of today's visit 

## 2012-10-25 NOTE — Progress Notes (Signed)
Pt tolerated injection well.Hollie Wojahn Lynetta  

## 2012-10-26 ENCOUNTER — Encounter (HOSPITAL_COMMUNITY)
Admission: RE | Admit: 2012-10-26 | Discharge: 2012-10-26 | Disposition: A | Discharge status: Home / Self Care | Payer: Medicare Other | Source: Ambulatory Visit | Attending: Critical Care Medicine | Admitting: Critical Care Medicine

## 2012-10-26 DIAGNOSIS — I379 Nonrheumatic pulmonary valve disorder, unspecified: Secondary | ICD-10-CM | POA: Insufficient documentation

## 2012-10-26 DIAGNOSIS — I359 Nonrheumatic aortic valve disorder, unspecified: Secondary | ICD-10-CM | POA: Diagnosis not present

## 2012-10-26 DIAGNOSIS — I4891 Unspecified atrial fibrillation: Secondary | ICD-10-CM | POA: Diagnosis not present

## 2012-10-26 DIAGNOSIS — I079 Rheumatic tricuspid valve disease, unspecified: Secondary | ICD-10-CM | POA: Insufficient documentation

## 2012-10-26 DIAGNOSIS — J8410 Pulmonary fibrosis, unspecified: Secondary | ICD-10-CM | POA: Diagnosis not present

## 2012-10-26 DIAGNOSIS — K449 Diaphragmatic hernia without obstruction or gangrene: Secondary | ICD-10-CM | POA: Diagnosis not present

## 2012-10-26 DIAGNOSIS — Z5189 Encounter for other specified aftercare: Secondary | ICD-10-CM | POA: Insufficient documentation

## 2012-10-26 DIAGNOSIS — I10 Essential (primary) hypertension: Secondary | ICD-10-CM | POA: Diagnosis not present

## 2012-10-31 ENCOUNTER — Encounter (HOSPITAL_COMMUNITY)
Admission: RE | Admit: 2012-10-31 | Discharge: 2012-10-31 | Disposition: A | Discharge status: Home / Self Care | Payer: Medicare Other | Source: Ambulatory Visit | Attending: Critical Care Medicine | Admitting: Critical Care Medicine

## 2012-11-02 ENCOUNTER — Encounter (HOSPITAL_COMMUNITY)
Admission: RE | Admit: 2012-11-02 | Discharge: 2012-11-02 | Disposition: A | Discharge status: Home / Self Care | Payer: Medicare Other | Source: Ambulatory Visit | Attending: Critical Care Medicine | Admitting: Critical Care Medicine

## 2012-11-07 ENCOUNTER — Encounter (HOSPITAL_COMMUNITY): Payer: Medicare Other

## 2012-11-09 ENCOUNTER — Encounter (HOSPITAL_COMMUNITY)
Admission: RE | Admit: 2012-11-09 | Discharge: 2012-11-09 | Disposition: A | Discharge status: Home / Self Care | Payer: Medicare Other | Source: Ambulatory Visit | Attending: Critical Care Medicine | Admitting: Critical Care Medicine

## 2012-11-09 ENCOUNTER — Other Ambulatory Visit: Payer: Self-pay | Admitting: Family Medicine

## 2012-11-09 NOTE — Telephone Encounter (Signed)
Due for a f/u appt 

## 2012-11-10 ENCOUNTER — Encounter: Payer: Self-pay | Admitting: Critical Care Medicine

## 2012-11-10 ENCOUNTER — Ambulatory Visit (INDEPENDENT_AMBULATORY_CARE_PROVIDER_SITE_OTHER): Payer: Medicare Other | Admitting: Critical Care Medicine

## 2012-11-10 VITALS — BP 118/76 | HR 69 | Temp 98.4°F | Ht 72.0 in | Wt 252.8 lb

## 2012-11-10 DIAGNOSIS — J8409 Other alveolar and parieto-alveolar conditions: Secondary | ICD-10-CM

## 2012-11-10 DIAGNOSIS — J8489 Other specified interstitial pulmonary diseases: Secondary | ICD-10-CM

## 2012-11-10 NOTE — Progress Notes (Signed)
Subjective:    Patient ID: [name], male    DOB: 03-01-1934, 78 y.o.   MRN: [id number]  HPI  11/10/2012 Chief Complaint  Patient presents with  . 2 month follow up    Breathing has improved.  Still has a little DOE and runny nose with clear drainage.  No wheezing, chest tightness, chest pain, or cough.  Breathing has improved. No cough .  No chest pain. Notes some dyspnea with walking. Now in pulm rehab. Pt denies any significant sore throat, nasal congestion or excess secretions, fever, chills, sweats, unintended weight loss, pleurtic or exertional chest pain, orthopnea PND, or leg swelling Pt denies any increase in rescue therapy over baseline, denies waking up needing it or having any early am or nocturnal exacerbations of coughing/wheezing/or dyspnea. Pt also denies any obvious fluctuation in symptoms with  weather or environmental change or other alleviating or aggravating factors    Past Medical History  Diagnosis Date  . Hypertension   . Hyperlipidemia   . Aortic stenosis     AVR 2011  . Dermatitis     poison ivy  . Cleft palate   . Interstitial lung disease      Family History  Problem Relation Age of Onset  . Heart attack Mother   . Cancer Father     lung and stomach  . Heart attack Father   . Cancer Sister     bone  . Diabetes Sister   . Heart murmur Brother   . Hypertension Brother   . Hyperlipidemia Brother   . Heart attack Brother      History   Social History  . Marital Status: Married    Spouse Name: N/A    Number of Children: N/A  . Years of Education: N/A   Occupational History  . Retired    Social History Main Topics  . Smoking status: Former Smoker -- 1.50 packs/day for 5 years    Types: Cigarettes, Cigars    Quit date: 09/08/1977  . Smokeless tobacco: Never Used  . Alcohol Use: No  . Drug Use: No  . Sexual Activity: Not on file   Other Topics Concern  . Not on file   Social History Narrative  . No narrative on file      No Known Allergies   Outpatient Prescriptions Prior to Visit  Medication Sig Dispense Refill  . aspirin 81 MG tablet Take 81 mg by mouth daily.        . metoprolol tartrate (LOPRESSOR) 25 MG tablet TAKE 1 TABLET BY MOUTH TWICE DAILY  60 tablet  0  . OVER THE COUNTER MEDICATION Super beta prostate - take 1 tablet by mouth twice daily      . pantoprazole (PROTONIX) 40 MG tablet TAKE 1 TABLET BY MOUTH EVERY DAY  30 tablet  2  . pravastatin (PRAVACHOL) 40 MG tablet TAKE 1 TABLET BY MOUTH EVERY NIGHT AT BEDTIME .  90 tablet  1  . predniSONE (DELTASONE) 10 MG tablet Take 2 tablets (20 mg total) by mouth daily.  90 tablet  2  . traMADol (ULTRAM) 50 MG tablet Take 1 tablet (50 mg total) by mouth every 8 (eight) hours as needed for pain.  45 tablet  1   No facility-administered medications prior to visit.       Review of Systems  Constitutional: Negative for fever, chills, diaphoresis and fatigue.  HENT: Positive for rhinorrhea. Negative for nosebleeds and postnasal drip.   Respiratory: Positive for  shortness of breath. Negative for cough, chest tightness and wheezing.        Objective:   Physical Exam   VITAL SIGNS:  BP 118/76  Pulse 69  Temp(Src) 98.4 F (36.9 C) (Oral)  Ht 6' (1.829 m)  Wt 252 lb 12.8 oz (114.669 kg)  BMI 34.28 kg/m2  SpO2 98%  HEENT: Head is normocephalic and atraumatic. Extraocular muscles are intact. Pupils are equal, round, and reactive to light and accommodation. Nares appeared normal. Mouth is well hydrated and without lesions. Mucous membranes are moist. Posterior pharynx clear of any exudate or lesions. No tonsils. NECK: Supple. No carotid bruits. No lymphadenopathy or thyromegaly. LUNGS: Clear to auscultation. Course breath sounds b/l lower lobes. No wheezing, rhonchi or rales. HEART: Regular rate and rhythm without murmur.  ABDOMEN: Soft, nontender, and nondistended. Positive bowel sounds. No hepatosplenomegaly was noted.  EXTREMITIES: Without any  cyanosis, clubbing, rash, lesions or edema.  NEUROLOGIC: Cranial nerves II through XII are grossly intact.  SKIN: No ulceration or induration present.       Assessment & Plan:   BOOP (bronchiolitis obliterans with organizing pneumonia) Boop Cop improved with prednisone Plan Cont prednisone 15mg /d No other changes    Updated Medication List Outpatient Encounter Prescriptions as of 11/10/2012  Medication Sig Dispense Refill  . aspirin 81 MG tablet Take 81 mg by mouth daily.        . metoprolol tartrate (LOPRESSOR) 25 MG tablet TAKE 1 TABLET BY MOUTH TWICE DAILY  60 tablet  0  . OVER THE COUNTER MEDICATION Super beta prostate - take 1 tablet by mouth twice daily      . pantoprazole (PROTONIX) 40 MG tablet TAKE 1 TABLET BY MOUTH EVERY DAY  30 tablet  2  . pravastatin (PRAVACHOL) 40 MG tablet TAKE 1 TABLET BY MOUTH EVERY NIGHT AT BEDTIME .  90 tablet  1  . predniSONE (DELTASONE) 10 MG tablet Take 2 tablets (20 mg total) by mouth daily.  90 tablet  2  . traMADol (ULTRAM) 50 MG tablet Take 1 tablet (50 mg total) by mouth every 8 (eight) hours as needed for pain.  45 tablet  1   No facility-administered encounter medications on file as of 11/10/2012.

## 2012-11-10 NOTE — Patient Instructions (Signed)
Reduce prednisone to 15mg  daily Continue in rehab Return 3 months

## 2012-11-11 NOTE — Assessment & Plan Note (Signed)
Boop Cop improved with prednisone Plan Cont prednisone 15mg /d No other changes

## 2012-11-14 ENCOUNTER — Encounter (HOSPITAL_COMMUNITY)
Admission: RE | Admit: 2012-11-14 | Discharge: 2012-11-14 | Disposition: A | Discharge status: Home / Self Care | Payer: Medicare Other | Source: Ambulatory Visit | Attending: Critical Care Medicine | Admitting: Critical Care Medicine

## 2012-11-14 NOTE — Progress Notes (Signed)
[name] 78 y.o. male Nutrition Note Spoke with pt. Pt is obese. Pt's current wt is down 22.2 lb over the past year. Pt is a "Lifetime member of [employer]." Per pt, " I know what to do to lose wt, it's just doing it that's my problem." Pt is currently on prednisone and denies taking Calcium supplements. There are some areas pt can improve his diet according to pt's Rate Your Plate results. Rate Your Plate results reviewed with pt. Pt expressed understanding. Pt does not avoid salty food; uses canned/ convenience food.  Pt adds and salt-containing seasonings to food.  The role of sodium in lung disease reviewed with pt.   Nutrition Diagnosis   Excessive sodium intake related to over consumption of processed food as evidenced by frequent consumption of convenience food/ canned vegetables and eating out frequently.   Food-and nutrition-related knowledge deficit related to lack of exposure to information as related to diagnosis of pulmonary disease   Obesity related to excessive energy intake as evidenced by a BMI of 33.1    Nutrition Rx/Est. Daily Nutrition Needs for: ? wt loss 1850-2350 Kcal  90-110 gm protein   1500 mg or less sodium      Nutrition Intervention   Pt's individual nutrition plan and goals reviewed with pt.   Benefits of adopting healthy eating habits discussed when pt's Rate Your Plate reviewed.   Pt to attend the Nutrition and Lung Disease class - met 10/26/12   Pt to start Calcium citrate with vitamin D 600 mg BID   Continual client-centered nutrition education by RD, as part of interdisciplinary care. Goal(s) 1. Pt to identify and limit food sources of sodium. 2. Identify food quantities necessary to achieve wt loss of  -2# per week to a goal wt of 101.3-109.5 kg (222-240 lb) at graduation from pulmonary rehab. 3. Describe the benefit of including fruits, vegetables, whole grains, and low-fat dairy products in a healthy meal plan. Monitor and Evaluate progress  toward nutrition goal with team.   Mickle Plumb, M.Ed, RD, LDN, CDE 11/14/2012 12:35 PM

## 2012-11-16 ENCOUNTER — Encounter (HOSPITAL_COMMUNITY)
Admission: RE | Admit: 2012-11-16 | Discharge: 2012-11-16 | Disposition: A | Discharge status: Home / Self Care | Payer: Medicare Other | Source: Ambulatory Visit | Attending: Critical Care Medicine | Admitting: Critical Care Medicine

## 2012-11-21 ENCOUNTER — Encounter (HOSPITAL_COMMUNITY)
Admission: RE | Admit: 2012-11-21 | Discharge: 2012-11-21 | Disposition: A | Discharge status: Home / Self Care | Payer: Medicare Other | Source: Ambulatory Visit | Attending: Critical Care Medicine | Admitting: Critical Care Medicine

## 2012-11-23 ENCOUNTER — Encounter (HOSPITAL_COMMUNITY)
Admission: RE | Admit: 2012-11-23 | Discharge: 2012-11-23 | Disposition: A | Discharge status: Home / Self Care | Payer: Medicare Other | Source: Ambulatory Visit | Attending: Critical Care Medicine | Admitting: Critical Care Medicine

## 2012-11-28 ENCOUNTER — Encounter (HOSPITAL_COMMUNITY)
Admission: RE | Admit: 2012-11-28 | Discharge: 2012-11-28 | Disposition: A | Discharge status: Home / Self Care | Payer: Medicare Other | Source: Ambulatory Visit | Attending: Critical Care Medicine | Admitting: Critical Care Medicine

## 2012-11-28 DIAGNOSIS — I379 Nonrheumatic pulmonary valve disorder, unspecified: Secondary | ICD-10-CM | POA: Diagnosis not present

## 2012-11-28 DIAGNOSIS — I4891 Unspecified atrial fibrillation: Secondary | ICD-10-CM | POA: Diagnosis not present

## 2012-11-28 DIAGNOSIS — K449 Diaphragmatic hernia without obstruction or gangrene: Secondary | ICD-10-CM | POA: Insufficient documentation

## 2012-11-28 DIAGNOSIS — I359 Nonrheumatic aortic valve disorder, unspecified: Secondary | ICD-10-CM | POA: Insufficient documentation

## 2012-11-28 DIAGNOSIS — I079 Rheumatic tricuspid valve disease, unspecified: Secondary | ICD-10-CM | POA: Diagnosis not present

## 2012-11-28 DIAGNOSIS — J8410 Pulmonary fibrosis, unspecified: Secondary | ICD-10-CM | POA: Diagnosis not present

## 2012-11-28 DIAGNOSIS — I10 Essential (primary) hypertension: Secondary | ICD-10-CM | POA: Diagnosis not present

## 2012-11-28 DIAGNOSIS — Z5189 Encounter for other specified aftercare: Secondary | ICD-10-CM | POA: Diagnosis not present

## 2012-11-30 ENCOUNTER — Encounter (HOSPITAL_COMMUNITY)
Admission: RE | Admit: 2012-11-30 | Discharge: 2012-11-30 | Disposition: A | Discharge status: Home / Self Care | Payer: Medicare Other | Source: Ambulatory Visit | Attending: Critical Care Medicine | Admitting: Critical Care Medicine

## 2012-12-05 ENCOUNTER — Encounter (HOSPITAL_COMMUNITY)
Admission: RE | Admit: 2012-12-05 | Discharge: 2012-12-05 | Disposition: A | Discharge status: Home / Self Care | Payer: Medicare Other | Source: Ambulatory Visit | Attending: Critical Care Medicine | Admitting: Critical Care Medicine

## 2012-12-06 ENCOUNTER — Other Ambulatory Visit: Payer: Self-pay | Admitting: Family Medicine

## 2012-12-07 ENCOUNTER — Encounter (HOSPITAL_COMMUNITY): Payer: Medicare Other

## 2012-12-08 ENCOUNTER — Other Ambulatory Visit: Payer: Self-pay | Admitting: Family Medicine

## 2012-12-12 ENCOUNTER — Encounter (HOSPITAL_COMMUNITY): Payer: Medicare Other

## 2012-12-14 ENCOUNTER — Encounter (HOSPITAL_COMMUNITY): Payer: Medicare Other

## 2012-12-23 ENCOUNTER — Other Ambulatory Visit: Payer: Self-pay | Admitting: Critical Care Medicine

## 2013-01-04 ENCOUNTER — Other Ambulatory Visit: Payer: Self-pay | Admitting: Family Medicine

## 2013-01-28 ENCOUNTER — Other Ambulatory Visit: Payer: Self-pay | Admitting: Family Medicine

## 2013-02-02 ENCOUNTER — Other Ambulatory Visit: Payer: Self-pay | Admitting: Family Medicine

## 2013-02-21 ENCOUNTER — Encounter: Payer: Self-pay | Admitting: Critical Care Medicine

## 2013-02-21 ENCOUNTER — Ambulatory Visit (INDEPENDENT_AMBULATORY_CARE_PROVIDER_SITE_OTHER): Payer: Medicare Other | Admitting: Critical Care Medicine

## 2013-02-21 VITALS — BP 118/66 | HR 57 | Temp 98.2°F | Ht 73.0 in | Wt 249.4 lb

## 2013-02-21 DIAGNOSIS — J8489 Other specified interstitial pulmonary diseases: Secondary | ICD-10-CM

## 2013-02-21 DIAGNOSIS — J8409 Other alveolar and parieto-alveolar conditions: Secondary | ICD-10-CM

## 2013-02-21 MED ORDER — PREDNISONE 10 MG PO TABS: 15.0000 mg | ORAL_TABLET | Freq: Every day | ORAL | Status: DC

## 2013-02-21 NOTE — Patient Instructions (Signed)
STay on prednisone 15mg  per day We will look into Esbriet for you for pulm fibrosis Return 2 months

## 2013-02-21 NOTE — Progress Notes (Signed)
Subjective:    Patient ID: [name], male    DOB: 02/21/1934, 79 y.o.   MRN: [id number]  HPI  02/21/2013 Chief Complaint  Patient presents with  . 3 month follow up    Breathing is much better.  Has finished pulm rehab but is still exercising.  No wheezing, chest tightness/pain, or cough at this time.   No cough or mucus. Dyspnea is better   Finished pulm rehab and now at maintenance.  No chest pain Walks . Bicycle. TMT, weights.   Past Medical History  Diagnosis Date  . Hypertension   . Hyperlipidemia   . Aortic stenosis     AVR 2011  . Dermatitis     poison ivy  . Cleft palate   . Interstitial lung disease      Family History  Problem Relation Age of Onset  . Heart attack Mother   . Cancer Father     lung and stomach  . Heart attack Father   . Cancer Sister     bone  . Diabetes Sister   . Heart murmur Brother   . Hypertension Brother   . Hyperlipidemia Brother   . Heart attack Brother      History   Social History  . Marital Status: Married    Spouse Name: N/A    Number of Children: N/A  . Years of Education: N/A   Occupational History  . Retired    Social History Main Topics  . Smoking status: Former Smoker -- 1.50 packs/day for 5 years    Types: Cigarettes, Cigars    Quit date: 09/08/1977  . Smokeless tobacco: Never Used  . Alcohol Use: No  . Drug Use: No  . Sexual Activity: Not on file   Other Topics Concern  . Not on file   Social History Narrative  . No narrative on file     No Known Allergies   Outpatient Prescriptions Prior to Visit  Medication Sig Dispense Refill  . aspirin 81 MG tablet Take 81 mg by mouth daily.        . metoprolol tartrate (LOPRESSOR) 25 MG tablet TAKE 1 TABLET BY MOUTH TWICE DAILY  60 tablet  0  . OVER THE COUNTER MEDICATION Super beta prostate - take 1 tablet by mouth twice daily      . pantoprazole (PROTONIX) 40 MG tablet TAKE 1 TABLET BY MOUTH EVERY DAY  30 tablet  0  . pravastatin (PRAVACHOL)  40 MG tablet TAKE 1 TABLET BY MOUTH ONCE DAILY AT BEDTIME  90 tablet  0  . traMADol (ULTRAM) 50 MG tablet Take 1 tablet (50 mg total) by mouth every 8 (eight) hours as needed for pain.  45 tablet  1  . predniSONE (DELTASONE) 10 MG tablet Take 1.5 tablets (15 mg total) by mouth daily. As directed  45 tablet  2  . predniSONE (DELTASONE) 10 MG tablet Take 2 tablets (20 mg total) by mouth daily.  90 tablet  2   No facility-administered medications prior to visit.       Review of Systems  Constitutional: Negative for fever, chills, diaphoresis and fatigue.  HENT: Positive for rhinorrhea. Negative for nosebleeds and postnasal drip.   Respiratory: Positive for shortness of breath. Negative for cough, chest tightness and wheezing.        Objective:   Physical Exam   VITAL SIGNS:  BP 118/66  Pulse 57  Temp(Src) 98.2 F (36.8 C) (Oral)  Ht 6\' 1"  (1.854 m)  Wt 249 lb 6.4 oz (113.127 kg)  BMI 32.91 kg/m2  SpO2 100%  HEENT: Head is normocephalic and atraumatic. Extraocular muscles are intact. Pupils are equal, round, and reactive to light and accommodation. Nares appeared normal. Mouth is well hydrated and without lesions. Mucous membranes are moist. Posterior pharynx clear of any exudate or lesions. No tonsils. NECK: Supple. No carotid bruits. No lymphadenopathy or thyromegaly. LUNGS: Clear to auscultation. Course breath sounds b/l lower lobes. No wheezing, rhonchi or rales. HEART: Regular rate and rhythm without murmur.  ABDOMEN: Soft, nontender, and nondistended. Positive bowel sounds. No hepatosplenomegaly was noted.  EXTREMITIES: Without any cyanosis, clubbing, rash, lesions or edema.  NEUROLOGIC: Cranial nerves II through XII are grossly intact.  SKIN: No ulceration or induration present.       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 aspirin 81 MG tablet Take 81 mg by mouth daily.    Marland Kitchen. loratadine (CLARITIN) 10 MG tablet Take 10 mg by mouth daily.  . metoprolol tartrate (LOPRESSOR) 25 MG tablet TAKE 1 TABLET BY MOUTH TWICE DAILY  . OVER THE COUNTER MEDICATION Super beta prostate - take 1 tablet by mouth twice daily  . pantoprazole (PROTONIX) 40 MG tablet TAKE 1 TABLET BY MOUTH EVERY DAY  . pravastatin (PRAVACHOL) 40 MG tablet TAKE 1 TABLET BY MOUTH ONCE DAILY AT BEDTIME  . predniSONE (DELTASONE) 10 MG tablet Take 1.5 tablets (15 mg total) by mouth daily. As directed  . traMADol (ULTRAM) 50 MG tablet Take 1 tablet (50 mg total) by mouth every 8 (eight) hours as needed for pain.  . [DISCONTINUED] predniSONE (DELTASONE) 10 MG tablet Take 1.5 tablets (15 mg total) by mouth daily. As directed  . [DISCONTINUED] predniSONE (DELTASONE) 10 MG tablet Take 2 tablets (20 mg total) by mouth daily.

## 2013-02-22 NOTE — Assessment & Plan Note (Signed)
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

## 2013-03-01 ENCOUNTER — Other Ambulatory Visit: Payer: Self-pay | Admitting: Family Medicine

## 2013-03-19 ENCOUNTER — Encounter: Payer: Self-pay | Admitting: Family Medicine

## 2013-03-19 ENCOUNTER — Ambulatory Visit (INDEPENDENT_AMBULATORY_CARE_PROVIDER_SITE_OTHER): Payer: Medicare Other | Admitting: Family Medicine

## 2013-03-19 VITALS — BP 133/73 | HR 72 | Wt 246.0 lb

## 2013-03-19 DIAGNOSIS — L723 Sebaceous cyst: Secondary | ICD-10-CM

## 2013-03-19 NOTE — Patient Instructions (Signed)

## 2013-03-19 NOTE — Progress Notes (Signed)
CC: [name] G [name] is a 79 y.o. male is here for cyst on the back of neck   Subjective: HPI:  Complains of swelling of the back of the neck that has been present for the past one to 2 months getting worse on a weekly basis seems like it's growing. No interventions as of yet. Painful to touch or with extension of the neck. Pain is described only as pain in nonradiating mild to moderate in severity. Originally had the appearance of a pimple now he denies any overlying skin changes that he can appreciate. Denies fevers, chills, swollen lymph nodes, nor skin changes elsewhere   Review Of Systems Outlined In HPI  Past Medical History  Diagnosis Date  . Hypertension   . Hyperlipidemia   . Aortic stenosis     AVR 2011  . Dermatitis     poison ivy  . Cleft palate   . Interstitial lung disease     Past Surgical History  Procedure Laterality Date  . Bilat cataract sx  2004  . Cholecystectomy  1999  . 7 surgeries for cleft palate  1938  . Oral surgery with bone graft from hip for cleft palate  1989  . Right groin hernia repair  1950's  . Rotator cuff tear, left shoulder    . Aortic valve replacement w/ pericardial tissue valve  10-15-09    Dr Barry Dieneswens   Family History  Problem Relation Age of Onset  . Heart attack Mother   . Cancer Father     lung and stomach  . Heart attack Father   . Cancer Sister     bone  . Diabetes Sister   . Heart murmur Brother   . Hypertension Brother   . Hyperlipidemia Brother   . Heart attack Brother     History   Social History  . Marital Status: Married    Spouse Name: N/A    Number of Children: N/A  . Years of Education: N/A   Occupational History  . Retired    Social History Main Topics  . Smoking status: Former Smoker -- 1.50 packs/day for 5 years    Types: Cigarettes, Cigars    Quit date: 09/08/1977  . Smokeless tobacco: Never Used  . Alcohol Use: No  . Drug Use: No  . Sexual Activity: Not on file   Other Topics Concern  . Not  on file   Social History Narrative  . No narrative on file     Objective: BP 133/73  Pulse 72  Wt 246 lb (111.585 kg)  General: Alert and Oriented, No Acute Distress HEENT: Pupils equal, round, reactive to light. Conjunctivae clear.  Moist membranes pharynx unremarkable Cardiac: Regular rate and rhythm. Normal S1/S2.  No murmurs, rubs, nor gallops.   Mental Status: No depression, anxiety, nor agitation. Skin: Warm and dry. On the back of the neck approximately 2 fingerbreadths down below the occiput slightly to the right of midline there is a tender slightly inflamed sebaceous cyst measuring approximately 1 cm in diameter with a central pore  Assessment & Plan: [name] was seen today for cyst on the back of neck.  Diagnoses and associated orders for this visit:  Inflamed sebaceous cyst    Patient is agreeable to cyst removal via punch biopsy which was successful. His son is a [employer] fundraisermedic and is more than welcome to remove the suture in one week or patient may return to see us for a no charge visit for suture removal  Return  in about 1 day (around 03/20/2013) for suture removal.  Sebaceous Cyst Excision Procedure Note  Pre-operative Diagnosis: inflammed sebaceous cyst Post-operative Diagnosis: same  Locations:posterior neck  Indications: pain and swelling  Anesthesia: Lidocaine 1% with epinephrine without added sodium bicarbonate  Procedure Details  History of allergy to iodine: no  Patient informed of the risks (including bleeding and infection) and benefits of the  procedure and Verbal informed consent obtained.  The lesion and surrounding area was given a sterile prep using betadyne and draped in the usual sterile fashion. An incision was made over the cyst, which was dissected free of the surrounding tissue and removed.  The cyst was filled with typical sebaceous material.  The wound was closed with 4-0 Prolene using one simple interrupted stitches. Antibiotic ointment and a  sterile dressing applied.  The specimen was not sent for pathologic examination. The patient tolerated the procedure well.  EBL: 1 ml  Findings: Successful removal of cyst fragments  Condition: Stable  Complications: none.  Plan: 1. Instructed to keep the wound dry and covered for 24-48h and clean thereafter. 2. Warning signs of infection were reviewed.   3. Recommended that the patient use OTC analgesics as needed for pain.  4. Return for suture removal in 1 week.

## 2013-03-23 ENCOUNTER — Encounter: Payer: Self-pay | Admitting: Critical Care Medicine

## 2013-03-26 ENCOUNTER — Encounter: Payer: Self-pay | Admitting: Family Medicine

## 2013-03-26 ENCOUNTER — Ambulatory Visit: Payer: Medicare Other | Admitting: Family Medicine

## 2013-03-27 ENCOUNTER — Ambulatory Visit (INDEPENDENT_AMBULATORY_CARE_PROVIDER_SITE_OTHER): Payer: Medicare Other | Admitting: Family Medicine

## 2013-03-27 ENCOUNTER — Encounter: Payer: Self-pay | Admitting: Family Medicine

## 2013-03-27 VITALS — BP 123/63 | HR 73 | Wt 247.0 lb

## 2013-03-27 DIAGNOSIS — Z4802 Encounter for removal of sutures: Secondary | ICD-10-CM

## 2013-03-27 NOTE — Progress Notes (Signed)
CC: Roy BundeRobert G Chan is a 79 y.o. male is here for Suture / Staple Removal   Subjective: HPI:  Returns after one week of punch biopsy aiding  In excision and drainage of inflamed sebaceous cyst on the back of the neck. He reports the pain is now nonexistent since he saw me last, there was some drainage of a clear and bloody discharge over the weekend after being in a hot shower. This is no longer present as of yesterday. Denies fevers, chills, nausea, nor any sense of feeling unwell   Review Of Systems Outlined In HPI  Past Medical History  Diagnosis Date  . Hypertension   . Hyperlipidemia   . Aortic stenosis     AVR 2011  . Dermatitis     poison ivy  . Cleft palate   . Interstitial lung disease     Past Surgical History  Procedure Laterality Date  . Bilat cataract sx  2004  . Cholecystectomy  1999  . 7 surgeries for cleft palate  1938  . Oral surgery with bone graft from hip for cleft palate  1989  . Right groin hernia repair  1950's  . Rotator cuff tear, left shoulder    . Aortic valve replacement w/ pericardial tissue valve  10-15-09    Dr Barry Dieneswens   Family History  Problem Relation Age of Onset  . Heart attack Mother   . Cancer Father     lung and stomach  . Heart attack Father   . Cancer Sister     bone  . Diabetes Sister   . Heart murmur Brother   . Hypertension Brother   . Hyperlipidemia Brother   . Heart attack Brother     History   Social History  . Marital Status: Married    Spouse Name: N/A    Number of Children: N/A  . Years of Education: N/A   Occupational History  . Retired    Social History Main Topics  . Smoking status: Former Smoker -- 1.50 packs/day for 5 years    Types: Cigarettes, Cigars    Quit date: 09/08/1977  . Smokeless tobacco: Never Used  . Alcohol Use: No  . Drug Use: No  . Sexual Activity: Not on file   Other Topics Concern  . Not on file   Social History Narrative  . No narrative on file     Objective: BP 123/63   Pulse 73  Wt 247 lb (112.038 kg)  On the back of the neck there is a well-healed scabbed over surgical scar with a single suture without any surrounding erythema or induration.  Drainage is not expressed with manipulation  Assessment & Plan: Roy [name] was seen today for suture / staple removal.  Diagnoses and associated orders for this visit:  Visit for suture removal    Single suture was removed without difficulty after prepping with Betadine, single Band-Aid placed over the scab. Leave the site alone other than when showering, reassurance provided no signs of infection . healing looks fantastic  No charge today given that this is followup yearly for sebaceous cyst removal  Return if symptoms worsen or fail to improve.

## 2013-04-01 ENCOUNTER — Other Ambulatory Visit: Payer: Self-pay | Admitting: Family Medicine

## 2013-04-04 ENCOUNTER — Encounter: Payer: Self-pay | Admitting: Critical Care Medicine

## 2013-04-05 ENCOUNTER — Telehealth: Payer: Self-pay

## 2013-04-05 NOTE — Telephone Encounter (Signed)
We received a fax from [facility] stating that we need a Non-formulary Exception and to call [phone] Ref# [id number]. [facility]. I initiated this requests and medication was approved through 01-24-14. It will be a Tier 4 and copay will be $80. I called and LM with Rayshone Pringle with Esbriet to see where we go from here. Will await call back. Carron CurieJennifer Mckell Riecke, CMA

## 2013-04-05 NOTE — Telephone Encounter (Signed)
I'm trying to do a PA for [name] on this pt and his Esbriet rx but I do not see it in his chart and I do not know how he is to take it.  Please advise.

## 2013-04-06 ENCOUNTER — Encounter: Payer: Self-pay | Admitting: Critical Care Medicine

## 2013-04-06 NOTE — Telephone Encounter (Signed)
Never received a call back from Rayshone so I called [facility] and spoke with rep there. I advised of the approval for the pt medication and that it was going to be under Tier 4 $80 copay. She states that they will now enroll him in the [employer] which provides copay assistance to esbriet patients. She will contact the pt to get him enrolled. I have sent an email to the pt advising him of this. Carron CurieJennifer Jenevieve Kirschbaum, CMA

## 2013-04-17 ENCOUNTER — Telehealth: Payer: Self-pay | Admitting: Critical Care Medicine

## 2013-04-17 NOTE — Telephone Encounter (Signed)
[facility]

## 2013-04-17 NOTE — Telephone Encounter (Signed)
R

## 2013-04-18 ENCOUNTER — Encounter: Payer: Self-pay | Admitting: *Deleted

## 2013-04-18 ENCOUNTER — Other Ambulatory Visit: Payer: Self-pay | Admitting: Critical Care Medicine

## 2013-04-18 ENCOUNTER — Ambulatory Visit (INDEPENDENT_AMBULATORY_CARE_PROVIDER_SITE_OTHER): Payer: Medicare Other | Admitting: Cardiology

## 2013-04-18 ENCOUNTER — Encounter: Payer: Self-pay | Admitting: Cardiology

## 2013-04-18 VITALS — BP 140/84 | HR 94 | Ht 73.0 in | Wt 247.0 lb

## 2013-04-18 DIAGNOSIS — I359 Nonrheumatic aortic valve disorder, unspecified: Secondary | ICD-10-CM | POA: Diagnosis not present

## 2013-04-18 NOTE — Patient Instructions (Signed)

## 2013-04-18 NOTE — Assessment & Plan Note (Signed)
Continue metoprolol. 

## 2013-04-18 NOTE — Assessment & Plan Note (Signed)
Continue SBE prophylaxis. Repeat echocardiogram. 

## 2013-04-18 NOTE — Progress Notes (Signed)
HPI: FU AVR. Previously diagnosed with severe AS by echo. Cardiac catheterization in Sept 2011 revealed nonobstructive coronary disease, normal LV function and moderately severe aortic stenosis with a mean gradient of 34 mm of mercury. The patient then underwent aortic valve replacement on September 21 of 2011 with a pericardial tissue valve. Last echocardiogram performed in May of 2014. This showed normal LV function and a normal tissue aortic valve; mean gradient 12 mm of mercury; no aortic insufficiency. A chest CT was performed in May of 2014 and suggested interstitial lung disease. The patient seen by Dr. Delford Field and diagnosed with BOOP. Since I last saw him, he continues to have dyspnea on exertion but no orthopnea, PND, pedal edema or syncope or chest pain.   Current Outpatient Prescriptions  Medication Sig Dispense Refill  . aspirin 81 MG tablet Take 81 mg by mouth daily.        Marland Kitchen loratadine (CLARITIN) 10 MG tablet Take 10 mg by mouth daily.      . metoprolol tartrate (LOPRESSOR) 25 MG tablet TAKE 1 TABLET BY MOUTH TWICE DAILY  60 tablet  0  . OVER THE COUNTER MEDICATION Super beta prostate - take 1 tablet by mouth twice daily      . pantoprazole (PROTONIX) 40 MG tablet TAKE 1 TABLET BY MOUTH EVERY DAY  30 tablet  0  . pravastatin (PRAVACHOL) 40 MG tablet TAKE 1 TABLET BY MOUTH ONCE DAILY AT BEDTIME  90 tablet  0  . predniSONE (DELTASONE) 10 MG tablet Take 1.5 tablets (15 mg total) by mouth daily. As directed  45 tablet  11  . traMADol (ULTRAM) 50 MG tablet Take 1 tablet (50 mg total) by mouth every 8 (eight) hours as needed for pain.  45 tablet  1  . [DISCONTINUED] tadalafil (CIALIS) 20 MG tablet Take 20 mg by mouth daily as needed. Take one about 1-2 hours before sexual intercourse        No current facility-administered medications for this visit.     Past Medical History  Diagnosis Date  . Hypertension   . Hyperlipidemia   . Aortic stenosis     AVR 2011  . Dermatitis     poison ivy  . Cleft palate   . Interstitial lung disease     Past Surgical History  Procedure Laterality Date  . Bilat cataract sx  2004  . Cholecystectomy  1999  . 7 surgeries for cleft palate  1938  . Oral surgery with bone graft from hip for cleft palate  1989  . Right groin hernia repair  1950's  . Rotator cuff tear, left shoulder    . Aortic valve replacement w/ pericardial tissue valve  10-15-09    Dr Barry Dienes    History   Social History  . Marital Status: Married    Spouse Name: N/A    Number of Children: N/A  . Years of Education: N/A   Occupational History  . Retired    Social History Main Topics  . Smoking status: Former Smoker -- 1.50 packs/day for 5 years    Types: Cigarettes, Cigars    Quit date: 09/08/1977  . Smokeless tobacco: Never Used  . Alcohol Use: No  . Drug Use: No  . Sexual Activity: Not on file   Other Topics Concern  . Not on file   Social History Narrative  . No narrative on file    ROS: no fevers or chills, productive cough, hemoptysis, dysphasia, odynophagia, melena,  hematochezia, dysuria, hematuria, rash, seizure activity, orthopnea, PND, pedal edema, claudication. Remaining systems are negative.  Physical Exam: Well-developed well-nourished in no acute distress.  Skin is warm and dry.  HEENT is normal.  Neck is supple.  Chest is clear to auscultation with normal expansion.  Cardiovascular exam is regular rate and rhythm. 2/6 systolic murmur left sternal border. No diastolic murmur. Abdominal exam nontender or distended. No masses palpated. Extremities show no edema. neuro grossly intact  ECG sinus rhythm at a rate of 94. Inferior lateral T-wave inversion. Right axis deviation.

## 2013-04-18 NOTE — Assessment & Plan Note (Signed)
Continue statin. 

## 2013-04-18 NOTE — Telephone Encounter (Signed)
Prednisone 10 mg take 1 1/2 tabs qd rx was sent 02/21/13 #45 x 11. Called [facility], was advised they do have this rx and nothing further is needed.

## 2013-04-19 NOTE — Telephone Encounter (Signed)
I spoke with Pharmacy and gave verbal for esbriet maintenance rx because box was not checked on forms. Carron CurieJennifer Castillo, CMA

## 2013-04-23 ENCOUNTER — Other Ambulatory Visit (INDEPENDENT_AMBULATORY_CARE_PROVIDER_SITE_OTHER): Payer: Medicare Other

## 2013-04-23 ENCOUNTER — Encounter: Payer: Self-pay | Admitting: Critical Care Medicine

## 2013-04-23 ENCOUNTER — Ambulatory Visit (INDEPENDENT_AMBULATORY_CARE_PROVIDER_SITE_OTHER): Payer: Medicare Other | Admitting: Critical Care Medicine

## 2013-04-23 VITALS — BP 136/80 | HR 67 | Temp 97.6°F | Ht 72.0 in | Wt 251.0 lb

## 2013-04-23 DIAGNOSIS — D649 Anemia, unspecified: Secondary | ICD-10-CM

## 2013-04-23 DIAGNOSIS — J8409 Other alveolar and parieto-alveolar conditions: Secondary | ICD-10-CM

## 2013-04-23 DIAGNOSIS — T50905S Adverse effect of unspecified drugs, medicaments and biological substances, sequela: Secondary | ICD-10-CM

## 2013-04-23 DIAGNOSIS — J8489 Other specified interstitial pulmonary diseases: Secondary | ICD-10-CM

## 2013-04-23 LAB — HEPATIC FUNCTION PANEL
ALT: 20 U/L (ref 0–53)
AST: 20 U/L (ref 0–37)
Albumin: 4 g/dL (ref 3.5–5.2)
Alkaline Phosphatase: 53 U/L (ref 39–117)
BILIRUBIN DIRECT: 0.1 mg/dL (ref 0.0–0.3)
BILIRUBIN TOTAL: 0.5 mg/dL (ref 0.3–1.2)
TOTAL PROTEIN: 7 g/dL (ref 6.0–8.3)

## 2013-04-23 MED ORDER — PREDNISONE 10 MG PO TABS: ORAL_TABLET | ORAL | Status: DC

## 2013-04-23 NOTE — Progress Notes (Signed)
Subjective:    Patient ID: [name], male    DOB: May 10, 1934, 79 y.o.   MRN: [id number]  HPI  04/23/2013 Chief Complaint  Patient presents with  . BOOP    Breathing is improved. Denies chest tightness, SOB, coughing or wheezing at this time.  Dyspnea is better.  Uses oxygen mask to walk.    No real cough.  No real chest pain.   Now on esbriet 3 tid.  Now on current level several weeks. Pt denies any significant sore throat, nasal congestion or excess secretions, fever, chills, sweats, unintended weight loss, pleurtic or exertional chest pain, orthopnea PND, or leg swelling Pt denies any increase in rescue therapy over baseline, denies waking up needing it or having any early am or nocturnal exacerbations of coughing/wheezing/or dyspnea. Pt also denies any obvious fluctuation in symptoms with  weather or environmental change or other alleviating or aggravating factors    Past Medical History  Diagnosis Date  . Hypertension   . Hyperlipidemia   . Aortic stenosis     AVR 2011  . Dermatitis     poison ivy  . Cleft palate   . Interstitial lung disease      Family History  Problem Relation Age of Onset  . Heart attack Mother   . Cancer Father     lung and stomach  . Heart attack Father   . Cancer Sister     bone  . Diabetes Sister   . Heart murmur Brother   . Hypertension Brother   . Hyperlipidemia Brother   . Heart attack Brother      History   Social History  . Marital Status: Married    Spouse Name: N/A    Number of Children: N/A  . Years of Education: N/A   Occupational History  . Retired    Social History Main Topics  . Smoking status: Former Smoker -- 1.50 packs/day for 5 years    Types: Cigarettes, Cigars    Quit date: 09/08/1977  . Smokeless tobacco: Never Used  . Alcohol Use: No  . Drug Use: No  . Sexual Activity: Not on file   Other Topics Concern  . Not on file   Social History Narrative  . No narrative on file     No Known  Allergies   Outpatient Prescriptions Prior to Visit  Medication Sig Dispense Refill  . aspirin 81 MG tablet Take 81 mg by mouth daily.        Marland Kitchen. loratadine (CLARITIN) 10 MG tablet Take 10 mg by mouth daily.      . metoprolol tartrate (LOPRESSOR) 25 MG tablet TAKE 1 TABLET BY MOUTH TWICE DAILY  60 tablet  0  . OVER THE COUNTER MEDICATION Super beta prostate - take 1 tablet by mouth twice daily      . pantoprazole (PROTONIX) 40 MG tablet TAKE 1 TABLET BY MOUTH EVERY DAY  30 tablet  0  . Pirfenidone (ESBRIET) 267 MG CAPS Take 3 capsules by mouth 3 (three) times daily.  270 capsule    . pravastatin (PRAVACHOL) 40 MG tablet TAKE 1 TABLET BY MOUTH ONCE DAILY AT BEDTIME  90 tablet  0  . traMADol (ULTRAM) 50 MG tablet Take 1 tablet (50 mg total) by mouth every 8 (eight) hours as needed for pain.  45 tablet  1  . predniSONE (DELTASONE) 10 MG tablet Take 1.5 tablets (15 mg total) by mouth daily. As directed  45 tablet  11  No facility-administered medications prior to visit.       Review of Systems  Constitutional: Negative for fever, chills, diaphoresis and fatigue.  HENT: Positive for rhinorrhea. Negative for nosebleeds and postnasal drip.   Respiratory: Positive for shortness of breath. Negative for cough, chest tightness and wheezing.        Objective:   Physical Exam  Filed Vitals:   04/23/13 0854 04/23/13 0855  BP:  136/80  Pulse:  67  Temp: 97.6 F (36.4 C)   TempSrc: Oral   Height: 6' (1.829 m)   Weight: 251 lb (113.853 kg)   SpO2:  97%    Gen: Pleasant, well-nourished, in no distress,  normal affect  ENT: No lesions,  mouth clear,  oropharynx clear, no postnasal drip, surgical correction of cleft palate present  Neck: No JVD, no TMG, no carotid bruits  Lungs: No use of accessory muscles, no dullness to percussion, bibasilar dry rales  Cardiovascular: RRR, heart sounds normal, no murmur or gallops, no peripheral edema  Abdomen: soft and NT, no HSM,  BS  normal  Musculoskeletal: No deformities, no cyanosis or clubbing  Neuro: alert, non focal  Skin: Warm, no lesions or rashes  No results found.  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 aspirin 81 MG tablet Take 81 mg by mouth daily.    Marland Kitchen loratadine (CLARITIN) 10 MG tablet Take 10 mg by mouth daily.  . metoprolol tartrate (LOPRESSOR) 25 MG tablet TAKE 1 TABLET BY MOUTH TWICE DAILY  . OVER THE COUNTER MEDICATION Super beta prostate - take 1 tablet by mouth twice daily  . pantoprazole (PROTONIX) 40 MG tablet TAKE 1 TABLET BY MOUTH EVERY DAY  . Pirfenidone (ESBRIET) 267 MG CAPS Take 3 capsules by mouth 3 (three) times daily.  . pravastatin (PRAVACHOL) 40 MG tablet TAKE 1 TABLET BY MOUTH ONCE DAILY AT BEDTIME  . predniSONE (DELTASONE) 10 MG tablet Reduce to 1 daily for 7days then 1/2 daily for 7days and then STOP  . traMADol (ULTRAM) 50 MG tablet Take 1 tablet (50 mg total) by mouth every 8 (eight) hours as needed for pain.  . [DISCONTINUED] predniSONE (DELTASONE) 10 MG tablet Take 1.5 tablets (15 mg total) by mouth daily. As directed

## 2013-04-23 NOTE — Assessment & Plan Note (Signed)
Cryptogenic organizing pneumonia and interstitial lung disease on  idiopathic basis Positive response to corticosteroids and now patient is on pirfenidone Plan Maintain pirfenidone at current dose of 3 tablets 3 times daily Wean prednisone to off over the next 3 weeks Maintain oxygen 2 L continue

## 2013-04-23 NOTE — Patient Instructions (Signed)
Wean prednisone to off by 1/2 tablet every 7days LAbs today Stay on Esbriet Return 3 months

## 2013-04-24 ENCOUNTER — Other Ambulatory Visit: Payer: Self-pay | Admitting: Family Medicine

## 2013-04-24 LAB — CBC WITH DIFFERENTIAL/PLATELET
BASOS PCT: 0.6 % (ref 0.0–3.0)
Basophils Absolute: 0.1 10*3/uL (ref 0.0–0.1)
EOS ABS: 0.1 10*3/uL (ref 0.0–0.7)
Eosinophils Relative: 0.4 % (ref 0.0–5.0)
HEMATOCRIT: 37.4 % — AB (ref 39.0–52.0)
HEMOGLOBIN: 12.2 g/dL — AB (ref 13.0–17.0)
LYMPHS PCT: 16 % (ref 12.0–46.0)
Lymphs Abs: 2.3 10*3/uL (ref 0.7–4.0)
MCHC: 32.7 g/dL (ref 30.0–36.0)
MCV: 85.2 fl (ref 78.0–100.0)
Monocytes Absolute: 1.5 10*3/uL — ABNORMAL HIGH (ref 0.1–1.0)
Monocytes Relative: 9.9 % (ref 3.0–12.0)
Neutro Abs: 10.7 10*3/uL — ABNORMAL HIGH (ref 1.4–7.7)
Neutrophils Relative %: 73.1 % (ref 43.0–77.0)
Platelets: 133 10*3/uL — ABNORMAL LOW (ref 150.0–400.0)
RBC: 4.38 Mil/uL (ref 4.22–5.81)
RDW: 18.8 % — ABNORMAL HIGH (ref 11.5–14.6)
WBC: 14.7 10*3/uL — ABNORMAL HIGH (ref 4.5–10.5)

## 2013-04-24 NOTE — Progress Notes (Signed)
Quick Note:  Call pt and tell him labs are ok, No change in medications. Stay on esbriet ______

## 2013-04-25 DIAGNOSIS — H43819 Vitreous degeneration, unspecified eye: Secondary | ICD-10-CM | POA: Diagnosis not present

## 2013-04-25 NOTE — Progress Notes (Signed)
Quick Note:  Called and spoke to pts wife regarding the results and recs per Dr. Delford FieldWright. Pt's wife verbalized understanding and denied any further questions or concerns at this time. ______

## 2013-05-01 ENCOUNTER — Other Ambulatory Visit: Payer: Self-pay | Admitting: Family Medicine

## 2013-05-02 ENCOUNTER — Telehealth: Payer: Self-pay | Admitting: Critical Care Medicine

## 2013-05-02 ENCOUNTER — Encounter: Payer: Self-pay | Admitting: Critical Care Medicine

## 2013-05-02 NOTE — Telephone Encounter (Signed)
Per 4.8.15 mychart chain from pt: Composed 05/02/2013 2:29 PM   Victorino DikeJennifer  this note does not make sense.   You didn't answer anything.  Dr. Delford FieldWright told us in the begining, that this was NOT a medication you could pick up at local Pharmacy etc.   We all need to get on the same page.   Call my wife on her cell [phone] Jan Nix  Thank you  Emogene Morganobert Althoff   ----- Message -----  From: CMA Wendall PapaJennifer R C  Sent: 05/02/2013 2:13 PM EDT  To: Guss Bundeobert G Herringshaw  Subject: RE: Non-Urgent Medical Question   If there is anything further needed about your Esbriet medication please let un know. Have a great day   Per 4.8.15 phone note, Victorino DikeJennifer spoke personally with patient and explained to him that [facility] patient before shipping medication because someone MUST be home when it is delivered.  Per Victorino DikeJennifer, the email that pt is referring to was sent after speaking with patient to inform him to contact the office with any further questions regarding his Esbriet.  Called spoke with pt's spouse Jan and discussed the above with her.  Per Jan, [facility].  Gave spouse the number that Victorino DikeJennifer had called [phone]) and advised her to let the rep know that she is calling on the behalf of an Esbriet pt who needs his medication shipped and they will transfer her to the department/persons she needs to speak with.  Spouse asked if this needs to be done every time pt gets low on his Esbriet (he has 2 left).  Informed Jan that [facility] had been trying [facility] contact pt but couldn't get anyone on the phone - so if [facility] does not [facility] within 10-14 days of pt running out of his medication, then she needs to initiate this.  Jan verbalized her understanding and denied any further questions/concerns at this time.  Encouraged her to call with any other  questions.  Nothing further needed; will sign off.

## 2013-05-02 NOTE — Telephone Encounter (Signed)
Per 04/17/13 phone note we called [facility] I called them at [phone] and spoke with rep and was advised due to the type of medication and cost someone has to be home to get the delivery so they have to speak with the pt to set a time and date before med will be shipped. They advised they have atc the pt several times without success so Rx has been placed on hold. They advised all the pt needs to do is call them and they can schedule a delivery date. If pt does not call them soon the rx will be cancelled out. I ATC the pt at home number, Na, voicemail full. I then called cell # and spoke with the pt and advised. I provided him the [facility] and advised he call today. Pt states understanding. Carron CurieJennifer Shavonda Wiedman, CMA

## 2013-05-07 ENCOUNTER — Telehealth: Payer: Self-pay | Admitting: Critical Care Medicine

## 2013-05-07 ENCOUNTER — Encounter: Payer: Self-pay | Admitting: Critical Care Medicine

## 2013-05-07 ENCOUNTER — Other Ambulatory Visit: Payer: Self-pay | Admitting: Family Medicine

## 2013-05-07 NOTE — Telephone Encounter (Signed)
Spoke with pt. He reports he was approved through [employer] grant to help pay for esbriet. [facility] has not sent pt his medication and he is out.  I called [facility] [phone] spoke with Fannie KneeSue. She reports she is showing pt was approved for grant and the copayment should be covered. She reports she is going to [facility] to check on to see why this has not been shipped and then she will call pt herself.   I called pt and made him aware as well. He will call us if he does not here from them. Nothing further needed

## 2013-05-23 ENCOUNTER — Encounter: Payer: Self-pay | Admitting: Critical Care Medicine

## 2013-05-24 LAB — PULMONARY FUNCTION TEST
DL/VA % pred: 95 %
DL/VA: 4.47 ml/min/mmHg/L
DLCO unc % pred: 50 %
DLCO unc: 17.52 ml/min/mmHg
FEF 25-75 Post: 3.52 L/sec
FEF 25-75 Pre: 2.97 L/sec
FEF2575-%Change-Post: 18 %
FEF2575-%Pred-Post: 148 %
FEF2575-%Pred-Pre: 125 %
FEV1-%Change-Post: 3 %
FEV1-%Pred-Post: 63 %
FEV1-%Pred-Pre: 61 %
FEV1-Post: 2.1 L
FEV1-Pre: 2.03 L
FEV1FVC-%Change-Post: 2 %
FEV1FVC-%Pred-Pre: 123 %
FEV6-%Change-Post: 0 %
FEV6-%Pred-Post: 53 %
FEV6-%Pred-Pre: 53 %
FEV6-Post: 2.29 L
FEV6-Pre: 2.28 L
FEV6FVC-%Pred-Post: 106 %
FEV6FVC-%Pred-Pre: 106 %
FVC-%Change-Post: 0 %
FVC-%Pred-Post: 50 %
FVC-%Pred-Pre: 50 %
FVC-Post: 2.3 L
Post FEV1/FVC ratio: 91 %
Post FEV6/FVC ratio: 100 %
Pre FEV1/FVC ratio: 89 %
Pre FEV6/FVC Ratio: 100 %
RV % pred: 80 %
RV: 2.17 L
TLC % pred: 57 %
TLC: 4.3 L

## 2013-05-28 ENCOUNTER — Other Ambulatory Visit (INDEPENDENT_AMBULATORY_CARE_PROVIDER_SITE_OTHER): Payer: Medicare Other

## 2013-05-28 ENCOUNTER — Encounter: Payer: Self-pay | Admitting: Adult Health

## 2013-05-28 ENCOUNTER — Ambulatory Visit (INDEPENDENT_AMBULATORY_CARE_PROVIDER_SITE_OTHER): Payer: Medicare Other | Admitting: Adult Health

## 2013-05-28 VITALS — BP 96/58 | HR 64 | Temp 98.1°F | Ht 72.0 in | Wt 242.0 lb

## 2013-05-28 DIAGNOSIS — J8409 Other alveolar and parieto-alveolar conditions: Secondary | ICD-10-CM

## 2013-05-28 DIAGNOSIS — R0609 Other forms of dyspnea: Secondary | ICD-10-CM | POA: Diagnosis not present

## 2013-05-28 DIAGNOSIS — R0600 Dyspnea, unspecified: Secondary | ICD-10-CM

## 2013-05-28 DIAGNOSIS — J8489 Other specified interstitial pulmonary diseases: Secondary | ICD-10-CM

## 2013-05-28 DIAGNOSIS — R0989 Other specified symptoms and signs involving the circulatory and respiratory systems: Secondary | ICD-10-CM

## 2013-05-28 LAB — CBC WITH DIFFERENTIAL/PLATELET
Basophils Absolute: 0 10*3/uL (ref 0.0–0.1)
Basophils Relative: 0.4 % (ref 0.0–3.0)
Eosinophils Absolute: 0.1 10*3/uL (ref 0.0–0.7)
Eosinophils Relative: 0.8 % (ref 0.0–5.0)
HCT: 33.7 % — ABNORMAL LOW (ref 39.0–52.0)
Hemoglobin: 11.2 g/dL — ABNORMAL LOW (ref 13.0–17.0)
LYMPHS ABS: 1.5 10*3/uL (ref 0.7–4.0)
Lymphocytes Relative: 15.2 % (ref 12.0–46.0)
MCHC: 33.3 g/dL (ref 30.0–36.0)
MCV: 83.4 fl (ref 78.0–100.0)
MONO ABS: 0.9 10*3/uL (ref 0.1–1.0)
MONOS PCT: 9.5 % (ref 3.0–12.0)
Neutro Abs: 7.3 10*3/uL (ref 1.4–7.7)
Neutrophils Relative %: 74.1 % (ref 43.0–77.0)
PLATELETS: 176 10*3/uL (ref 150.0–400.0)
RBC: 4.04 Mil/uL — ABNORMAL LOW (ref 4.22–5.81)
RDW: 18 % — ABNORMAL HIGH (ref 11.5–14.6)
WBC: 9.9 10*3/uL (ref 4.5–10.5)

## 2013-05-28 LAB — BASIC METABOLIC PANEL
BUN: 17 mg/dL (ref 6–23)
CALCIUM: 9.5 mg/dL (ref 8.4–10.5)
CO2: 27 meq/L (ref 19–32)
Chloride: 103 mEq/L (ref 96–112)
Creatinine, Ser: 1.1 mg/dL (ref 0.4–1.5)
GFR: 67.61 mL/min (ref >60.00)
Glucose, Bld: 96 mg/dL (ref 70–99)
Potassium: 4.6 mEq/L (ref 3.5–5.1)
SODIUM: 138 meq/L (ref 135–145)

## 2013-05-28 LAB — HEPATIC FUNCTION PANEL
ALT: 17 U/L (ref 0–53)
AST: 25 U/L (ref 0–37)
Albumin: 3.9 g/dL (ref 3.5–5.2)
Alkaline Phosphatase: 62 U/L (ref 39–117)
BILIRUBIN TOTAL: 0.6 mg/dL (ref 0.3–1.2)
Bilirubin, Direct: 0.1 mg/dL (ref 0.0–0.3)
Total Protein: 7.2 g/dL (ref 6.0–8.3)

## 2013-05-28 LAB — CORTISOL: CORTISOL PLASMA: 13.8 ug/dL

## 2013-05-28 LAB — BRAIN NATRIURETIC PEPTIDE: Pro B Natriuretic peptide (BNP): 141 pg/mL — ABNORMAL HIGH (ref 0.0–100.0)

## 2013-05-28 NOTE — Assessment & Plan Note (Addendum)
Cryptogenic organizing pneumonia and interstitial lung disease on idiopathic basis  Does not appear to be in flare -sats adequate without increased O2 demands, no increased cough . Dyspnea is at baseline.  Suspect symptoms may me more related to being off steroids  Will check cortisol level , r/o adrenal insufficiency  rechallenge with steroids with a slow  taper to off over next 4 weeks  Advised of side effects of chronic steroids   Plan  Restart Prednisone 10mg  daily for 1 week then 5mg  daily for 2 weeks then 2.5mg   Every other day for 2 weeks then stop .  Labs today .  follow up Dr. Delford FieldWright  In 1 month as planned and As needed   Please contact office for sooner follow up if symptoms do not improve or worsen or seek emergency care

## 2013-05-28 NOTE — Progress Notes (Signed)
   Subjective:    Patient ID: [name], male    DOB: 01-22-1935, 79 y.o.   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 On chronic O2 at  On Esbriet 03/2013  Pulmonary rehab completed 10/2012 Hx of severe AS s/p AVR , CAD   05/28/2013 Acute OV  Complains of  increased fatigue, decreased stamina, decreased appetite.  Feels this is related to stopping the prednisone 2-3 weeks ago. Was weaned off prednisone slowly over last 2 months, has been off for 2 weeks.  No flare of cough , dyspnea , or increased O2 demands.  Continues Esbriet 3 Three times a day   No n/v/d.  Just feels he has no energy since being off steroids.  Today in office walk w/ O2 sats >90% on 2l/m .  No chest pain, orthopnea, edema , hemoptysis, palpitations, discolored mucus or fever.    Review of Systems Constitutional:   No  weight loss, night sweats,  Fevers, chills,  +fatigue, or  lassitude.  HEENT:   No headaches,  Difficulty swallowing,  Tooth/dental problems, or  Sore throat,                No sneezing, itching, ear ache, nasal congestion, post nasal drip,   CV:  No chest pain,  Orthopnea, PND, swelling in lower extremities, anasarca, dizziness, palpitations, syncope.   GI  No heartburn, indigestion, abdominal pain, nausea, vomiting, diarrhea, change in bowel habits, loss of appetite, bloody stools.   Resp:    No chest wall deformity  Skin: no rash or lesions.  GU: no dysuria, change in color of urine, no urgency or frequency.  No flank pain, no hematuria   MS:  No joint pain or swelling.  No decreased range of motion.  No back pain.  Psych:  No change in mood or affect. No depression or anxiety.  No memory loss.         Objective:   Physical Exam GEN: A/Ox3; pleasant , NAD, elderly   HEENT:  [place]/AT,  EACs-clear, TMs-wnl, NOSE-clear, THROAT-clear, no lesions, no postnasal drip or exudate noted.    NECK:  Supple w/ fair ROM; no JVD; normal carotid impulses w/o bruits; no thyromegaly or nodules palpated; no lymphadenopathy.  RESP  Bibasilar crackles lear  P & A; w/o, wheezes/ rales/ or rhonchi.no accessory muscle use, no dullness to percussion  CARD:  RRR, no m/r/g  , no peripheral edema, pulses intact, no cyanosis or clubbing.  GI:   Soft & nt; nml bowel sounds; no organomegaly or masses detected.  Musco: Warm bil, no deformities or joint swelling noted.   Neuro: alert, no focal deficits noted.    Skin: Warm, no lesions or rashes       Assessment & Plan:

## 2013-05-28 NOTE — Patient Instructions (Signed)
Restart Prednisone 10mg  daily for 1 week then 5mg  daily for 2 weeks then 2.5mg   Every other day for 2 weeks then stop .  Labs today .  follow up Dr. Delford FieldWright  In 1 month as planned and As needed   Please contact office for sooner follow up if symptoms do not improve or worsen or seek emergency care

## 2013-05-31 NOTE — Progress Notes (Signed)
Quick Note:  Called spoke with patient, advised of lab results / recs as stated by TP. Pt verbalized his understanding and denied any questions. ______ 

## 2013-06-04 ENCOUNTER — Other Ambulatory Visit: Payer: Self-pay | Admitting: Family Medicine

## 2013-06-05 ENCOUNTER — Ambulatory Visit (HOSPITAL_COMMUNITY): Payer: Medicare Other | Attending: Cardiology | Admitting: Radiology

## 2013-06-05 DIAGNOSIS — I2510 Atherosclerotic heart disease of native coronary artery without angina pectoris: Secondary | ICD-10-CM | POA: Diagnosis not present

## 2013-06-05 DIAGNOSIS — I4891 Unspecified atrial fibrillation: Secondary | ICD-10-CM

## 2013-06-05 DIAGNOSIS — I359 Nonrheumatic aortic valve disorder, unspecified: Secondary | ICD-10-CM | POA: Diagnosis not present

## 2013-06-05 NOTE — Progress Notes (Signed)
Echocardiogram performed.  

## 2013-06-07 ENCOUNTER — Telehealth: Payer: Self-pay | Admitting: Critical Care Medicine

## 2013-06-07 DIAGNOSIS — I359 Nonrheumatic aortic valve disorder, unspecified: Secondary | ICD-10-CM

## 2013-06-07 MED ORDER — PIRFENIDONE 267 MG PO CAPS: 3.0000 | ORAL_CAPSULE | Freq: Three times a day (TID) | ORAL | Status: DC

## 2013-06-07 NOTE — Telephone Encounter (Signed)
Per OV w/ PW 04/23/13; BOOP (bronchiolitis obliterans with organizing pneumonia) - Storm FriskPatrick E Wright, MD at 04/23/2013  9:52 AM      Status: Written Related Problem: BOOP (bronchiolitis obliterans with organizing pneumonia)    Cryptogenic organizing pneumonia and interstitial lung disease on  idiopathic basis Positive response to corticosteroids and now patient is on pirfenidone Plan Maintain pirfenidone at current dose of 3 tablets 3 times daily Wean prednisone to off over the next 3 weeks Maintain oxygen 2 L continue  --  Called [facility]  Esbriet does not require PA they just needed a refill auth. I gave VO. Nothing furthr needed

## 2013-06-29 ENCOUNTER — Ambulatory Visit (INDEPENDENT_AMBULATORY_CARE_PROVIDER_SITE_OTHER): Payer: Medicare Other | Admitting: Critical Care Medicine

## 2013-06-29 ENCOUNTER — Encounter: Payer: Self-pay | Admitting: Critical Care Medicine

## 2013-06-29 VITALS — BP 130/76 | HR 74 | Ht 73.0 in | Wt 238.0 lb

## 2013-06-29 DIAGNOSIS — J8409 Other alveolar and parieto-alveolar conditions: Secondary | ICD-10-CM

## 2013-06-29 DIAGNOSIS — J8489 Other specified interstitial pulmonary diseases: Secondary | ICD-10-CM

## 2013-06-29 NOTE — Patient Instructions (Addendum)
No change in medications. Return in       3  months Return to doing some exercises

## 2013-06-29 NOTE — Assessment & Plan Note (Signed)
Boop with associated ILD /IPF Plan Cont esbriet 3 tid Note labs ok Cont oxygen Rx

## 2013-06-29 NOTE — Progress Notes (Signed)
Subjective:    Patient ID: [name], male    DOB: 02-07-1934, 79 y.o.   MRN: [id number]  HPI  79 yo male with known hx of Bronchiolitis obliterans organized pneumonia with progression of interstitial fibrosis. On chronic O2 at  [facility] 03/2013  Pulmonary rehab completed 10/2012 Hx of severe AS s/p AVR , CAD   06/29/2013 Chief Complaint  Patient presents with  . 1 month follow up    Fatigue and stamina have improved a little.  DOE unchanged.  Cough started x 1 month ago with light mucus.  No chest tightness/pain.   Pt seen 05/28/13 with NP.  Restarted prednisone and tapered off. Off for a week. Pt on 2L 24/7 . sats ok On esbriet. Tolerating well.  Some diarrhea noted.   Stamina is less. Pt had pulm rehab , graduated last fall 2014.   Review of Systems  Constitutional:   No  weight loss, night sweats,  Fevers, chills,  +fatigue, or  lassitude.  HEENT:   No headaches,  Difficulty swallowing,  Tooth/dental problems, or  Sore throat,                No sneezing, itching, ear ache, nasal congestion, post nasal drip,   CV:  No chest pain,  Orthopnea, PND, swelling in lower extremities, anasarca, dizziness, palpitations, syncope.   GI  No heartburn, indigestion, abdominal pain, nausea, vomiting, diarrhea, change in bowel habits, loss of appetite, bloody stools.   Resp:    No chest wall deformity  Skin: no rash or lesions.  GU: no dysuria, change in color of urine, no urgency or frequency.  No flank pain, no hematuria   MS:  No joint pain or swelling.  No decreased range of motion.  No back pain.  Psych:  No change in mood or affect. No depression or anxiety.  No memory loss.      Objective:   Physical Exam BP 130/76  Pulse 74  Ht 6\' 1"  (1.854 m)  Wt 238 lb (107.956 kg)  BMI 31.41 kg/m2  SpO2 98%  GEN: A/Ox3; pleasant , NAD, elderly   HEENT:  [place]/AT,  EACs-clear, TMs-wnl, NOSE-clear, THROAT-clear, no lesions, no postnasal drip or exudate noted.   NECK:   Supple w/ fair ROM; no JVD; normal carotid impulses w/o bruits; no thyromegaly or nodules palpated; no lymphadenopathy.  RESP  Bibasilar crackles lear  P & A; w/o, wheezes/ rales/ or rhonchi.no accessory muscle use, no dullness to percussion  CARD:  RRR, no m/r/g  , no peripheral edema, pulses intact, no cyanosis or clubbing.  GI:   Soft & nt; nml bowel sounds; no organomegaly or masses detected.  Musco: Warm bil, no deformities or joint swelling noted.   Neuro: alert, no focal deficits noted.    Skin: Warm, no lesions or rashes  Lab Results  Component Value Date   ALT 17 05/28/2013   AST 25 05/28/2013   ALKPHOS 62 05/28/2013   BILITOT 0.6 05/28/2013    Lab Results  Component Value Date   WBC 9.9 05/28/2013   HGB 11.2* 05/28/2013   HCT 33.7* 05/28/2013   MCV 83.4 05/28/2013   PLT 176.0 05/28/2013  .   Assessment & Plan:   BOOP (bronchiolitis obliterans with organizing pneumonia) Boop with associated ILD /IPF Plan Cont esbriet 3 tid Note labs ok Cont oxygen Rx   Updated Medication List Outpatient Encounter Prescriptions as of 06/29/2013  Medication Sig  . aspirin 81 MG tablet Take  81 mg by mouth daily.    Marland Kitchen loratadine (CLARITIN) 10 MG tablet Take 10 mg by mouth daily.  . metoprolol tartrate (LOPRESSOR) 25 MG tablet TAKE 1 TABLET BY MOUTH TWICE DAILY  . OVER THE COUNTER MEDICATION Super beta prostate - take 1 tablet by mouth twice daily  . pantoprazole (PROTONIX) 40 MG tablet TAKE 1 TABLET BY MOUTH DAILY  . Pirfenidone (ESBRIET) 267 MG CAPS Take 3 capsules by mouth 3 (three) times daily.  . pravastatin (PRAVACHOL) 40 MG tablet TAKE 1 TABLET BY MOUTH ONCE DAILY AT BEDTIME  . traMADol (ULTRAM) 50 MG tablet Take 1 tablet (50 mg total) by mouth every 8 (eight) hours as needed for pain.

## 2013-07-02 ENCOUNTER — Telehealth: Payer: Self-pay | Admitting: Critical Care Medicine

## 2013-07-02 MED ORDER — AZITHROMYCIN 250 MG PO TABS: ORAL_TABLET | ORAL | Status: DC

## 2013-07-02 MED ORDER — PREDNISONE 10 MG PO TABS
ORAL_TABLET | ORAL | Status: DC
Start: 2013-07-02 — End: 2013-07-10

## 2013-07-02 NOTE — Telephone Encounter (Signed)
Call in azithromycin 250mg  Take two once then one daily until gone #6 Prednisone 10mg  Take 4 for three days 3 for three days 2 for three days 1 for three days and stop #30 No other changes

## 2013-07-02 NOTE — Telephone Encounter (Signed)
Seen 06/29/2013 by PW Pt c/o increased dry cough x 1 day with clear mucus at times.  Pt states that he feels his oxygen is making him cough, "feels contaminated" Pt not using anything OTC for cough.  Difficulty sleeping last night d/t cough.   No Known Allergies  Please advise Dr Delford Field. Thanks.

## 2013-07-02 NOTE — Telephone Encounter (Signed)
Pt wife aware of recs pr PW Zpak and Pred taper sent to [facility] pharmacy Nothing further needed.

## 2013-07-05 ENCOUNTER — Other Ambulatory Visit: Payer: Self-pay | Admitting: Family Medicine

## 2013-07-08 ENCOUNTER — Emergency Department (HOSPITAL_BASED_OUTPATIENT_CLINIC_OR_DEPARTMENT_OTHER)
Admission: EM | Admit: 2013-07-08 | Discharge: 2013-07-08 | Disposition: A | Discharge status: Home / Self Care | Payer: Medicare Other | Attending: Emergency Medicine | Admitting: Emergency Medicine

## 2013-07-08 ENCOUNTER — Emergency Department (HOSPITAL_BASED_OUTPATIENT_CLINIC_OR_DEPARTMENT_OTHER): Payer: Medicare Other

## 2013-07-08 ENCOUNTER — Encounter (HOSPITAL_BASED_OUTPATIENT_CLINIC_OR_DEPARTMENT_OTHER): Payer: Self-pay | Admitting: Emergency Medicine

## 2013-07-08 DIAGNOSIS — Z79899 Other long term (current) drug therapy: Secondary | ICD-10-CM | POA: Diagnosis not present

## 2013-07-08 DIAGNOSIS — E785 Hyperlipidemia, unspecified: Secondary | ICD-10-CM | POA: Diagnosis not present

## 2013-07-08 DIAGNOSIS — Z7982 Long term (current) use of aspirin: Secondary | ICD-10-CM | POA: Insufficient documentation

## 2013-07-08 DIAGNOSIS — J8410 Pulmonary fibrosis, unspecified: Secondary | ICD-10-CM | POA: Insufficient documentation

## 2013-07-08 DIAGNOSIS — IMO0002 Reserved for concepts with insufficient information to code with codable children: Secondary | ICD-10-CM | POA: Diagnosis not present

## 2013-07-08 DIAGNOSIS — J984 Other disorders of lung: Secondary | ICD-10-CM | POA: Diagnosis not present

## 2013-07-08 DIAGNOSIS — I10 Essential (primary) hypertension: Secondary | ICD-10-CM | POA: Insufficient documentation

## 2013-07-08 DIAGNOSIS — Z87891 Personal history of nicotine dependence: Secondary | ICD-10-CM | POA: Diagnosis not present

## 2013-07-08 DIAGNOSIS — Z87738 Personal history of other specified (corrected) congenital malformations of digestive system: Secondary | ICD-10-CM | POA: Insufficient documentation

## 2013-07-08 DIAGNOSIS — J069 Acute upper respiratory infection, unspecified: Secondary | ICD-10-CM | POA: Diagnosis not present

## 2013-07-08 DIAGNOSIS — R059 Cough, unspecified: Secondary | ICD-10-CM

## 2013-07-08 DIAGNOSIS — Z792 Long term (current) use of antibiotics: Secondary | ICD-10-CM | POA: Diagnosis not present

## 2013-07-08 DIAGNOSIS — Z872 Personal history of diseases of the skin and subcutaneous tissue: Secondary | ICD-10-CM | POA: Insufficient documentation

## 2013-07-08 DIAGNOSIS — R05 Cough: Secondary | ICD-10-CM | POA: Diagnosis not present

## 2013-07-08 HISTORY — DX: Pulmonary fibrosis, unspecified: J84.10

## 2013-07-08 MED ORDER — BENZONATATE 100 MG PO CAPS: 100.0000 mg | ORAL_CAPSULE | Freq: Three times a day (TID) | ORAL | Status: DC | PRN

## 2013-07-08 NOTE — ED Provider Notes (Signed)
CSN: [id number]     Arrival date & time 07/08/13  0730 History   First MD Initiated Contact with Patient 07/08/13 0800     Chief Complaint  Patient presents with  . Cough     (Consider location/radiation/quality/duration/timing/severity/associated sxs/prior Treatment) HPI 79 year old male with a history of pulmonary fibrosis presents with one week of cough. She notes significant nasal congestion, which he thinks is causing his cough. He denies a postnasal drip. He is a little more short of breath than normal, especially noted with exertion. He has had to sit down more frequently than normal. He is normally on 2 L of oxygen has not noticed any new hypoxia. No fevers. Occasionally his chest wall hurts from coughing but no pain now. He called his pulmonary doctor who prescribed him in 12 day course of prednisone and a Z-Pak. Finish up his antibiotics 2 days ago. He started a cough, which is concerning him. He normally does not have any cough. Occasionally the cough has some green sputum but otherwise is nonproductive. Denies any leg swelling.  Past Medical History  Diagnosis Date  . Hypertension   . Hyperlipidemia   . Aortic stenosis     AVR 2011  . Dermatitis     poison ivy  . Cleft palate   . Interstitial lung disease   . Pulmonary fibrosis    Past Surgical History  Procedure Laterality Date  . Bilat cataract sx  2004  . Cholecystectomy  1999  . 7 surgeries for cleft palate  1938  . Oral surgery with bone graft from hip for cleft palate  1989  . Right groin hernia repair  1950's  . Rotator cuff tear, left shoulder    . Aortic valve replacement w/ pericardial tissue valve  10-15-09    Dr Barry Dieneswens   Family History  Problem Relation Age of Onset  . Heart attack Mother   . Cancer Father     lung and stomach  . Heart attack Father   . Cancer Sister     bone  . Diabetes Sister   . Heart murmur Brother   . Hypertension Brother   . Hyperlipidemia Brother   . Heart attack Brother     History  Substance Use Topics  . Smoking status: Former Smoker -- 1.50 packs/day for 5 years    Types: Cigarettes, Cigars    Quit date: 09/08/1977  . Smokeless tobacco: Never Used  . Alcohol Use: No    Review of Systems  Constitutional: Negative for fever.  HENT: Positive for congestion.   Respiratory: Positive for cough and shortness of breath. Negative for wheezing.   Cardiovascular: Negative for leg swelling.  Gastrointestinal: Negative for vomiting.  All other systems reviewed and are negative.     Allergies  Review of patient's allergies indicates no known allergies.  Home Medications   Prior to Admission medications   Medication Sig Start Date End Date Taking? Authorizing Provider  azithromycin (ZITHROMAX) 250 MG tablet Take 2 tabs once, then 1 daily 07/02/13  Yes Storm FriskPatrick E Wright, MD  predniSONE (DELTASONE) 10 MG tablet Take 4 tabs po x 3 days, then 3 x 3 days, then 2 x 3 days, then 1 x 3 days then stop. 07/02/13  Yes Storm FriskPatrick E Wright, MD  aspirin 81 MG tablet Take 81 mg by mouth daily.      Historical Provider, MD  loratadine (CLARITIN) 10 MG tablet Take 10 mg by mouth daily.    Historical Provider, MD  metoprolol  tartrate (LOPRESSOR) 25 MG tablet TAKE 1 TABLET BY MOUTH TWICE DAILY 07/05/13   Sean Hommel, DO  OVER THE COUNTER MEDICATION Super beta prostate - take 1 tablet by mouth twice daily    Historical Provider, MD  pantoprazole (PROTONIX) 40 MG tablet TAKE 1 TABLET BY MOUTH DAILY    Sean Hommel, DO  Pirfenidone (ESBRIET) 267 MG CAPS Take 3 capsules by mouth 3 (three) times daily. 06/07/13   Storm FriskPatrick E Wright, MD  pravastatin (PRAVACHOL) 40 MG tablet TAKE 1 TABLET BY MOUTH ONCE DAILY AT BEDTIME 05/07/13   Sean Hommel, DO  traMADol (ULTRAM) 50 MG tablet Take 1 tablet (50 mg total) by mouth every 8 (eight) hours as needed for pain. 06/01/12   Sean Hommel, DO   BP 110/75  Pulse 84  Temp(Src) 97.9 F (36.6 C) (Oral)  Resp 24  Ht 6' (1.829 m)  Wt 235 lb (106.595 kg)  BMI  31.86 kg/m2  SpO2 96% Physical Exam  Nursing note and vitals reviewed. Constitutional: He is oriented to person, place, and time. He appears well-developed and well-nourished.  HENT:  Head: Normocephalic and atraumatic.  Right Ear: External ear normal.  Left Ear: External ear normal.  Nose: Nose normal.  Eyes: Right eye exhibits no discharge. Left eye exhibits no discharge.  Neck: Neck supple.  Cardiovascular: Normal rate, regular rhythm, normal heart sounds and intact distal pulses.   Pulmonary/Chest: Effort normal. Not tachypneic. He has no wheezes.  Crackles in RUL and RLL  Abdominal: Soft. There is no tenderness.  Musculoskeletal: He exhibits no edema.  Neurological: He is alert and oriented to person, place, and time.  Skin: Skin is warm and dry.    ED Course  Procedures (including critical care time) Labs Review Labs Reviewed - No data to display  Imaging Review Dg Chest 2 View  07/08/2013   CLINICAL DATA:  Productive cough.  EXAM: CHEST  2 VIEW  COMPARISON:  08/03/2012 and CT 05/2012  FINDINGS: Two views of the chest were obtained. Patient has known interstitial lung disease based on the previous CT. Again noted are prominent interstitial lung markings with some volume loss in the right lung. Retrocardiac density is compatible with a large hiatal hernia. Heart size is stable and upper limits of normal. Median sternotomy wires are noted. Previous aortic valve surgery.  IMPRESSION: Diffuse interstitial lung densities likely represent chronic changes. Difficult to exclude mild edema. Minimal change since the previous examination.   Electronically Signed   By: Richarda OverlieAdam  Henn M.D.   On: 07/08/2013 08:26     EKG Interpretation None      MDM   Final diagnoses:  Cough  Upper respiratory infection    Patient's sats are normal on his normal 2 L of oxygen. He has no evidence of increased work of breathing. He has crackles in his right side to correlate with his x-ray changes of  chronic lung disease. There is no evidence of pneumonia. He has no peripheral edema or signs of CHF to suggest a pulmonary edema. He is afebrile. His artificial Debrox is currently on steroids. Not feel more Epogen be helpful given that there is no evidence of a bacterial pneumonia. We will put him on Tessalon Perles to suppress his cough. He will call his pulmonologist tomorrow for close outpatient followup.    Audree CamelScott T Nickoli Bagheri, MD 07/08/13 [phone]

## 2013-07-08 NOTE — Discharge Instructions (Signed)

## 2013-07-08 NOTE — ED Notes (Signed)
Patient here with ongoing cough that he describes as productive with green sputum x 1 week, patient was seen last Monday by pulmonary and started on steroids and antibiotics, just not feeling any better, no distress. Wears oxygen at 2L all the time for pulmonary fibrosis. Denies fever

## 2013-07-09 ENCOUNTER — Telehealth: Payer: Self-pay | Admitting: Critical Care Medicine

## 2013-07-09 NOTE — Telephone Encounter (Signed)
Called and spoke with pts wife and she stated that the pt was feeling worse on Friday and Saturday.  Pt was seen in [facility] yesterday.  Was given tessalon perles and cxr done.  pts wife stated that they told him he does not have PNA but she would like PW to look at the pts cxr to double check.  pts wife started pt on the nasocort to see if this will help.  She stated that the pt is not coughing more often but is coughing very deep. Pt has been having chills but no fever.    Any further recs from PW.  pts wife is aware that we will call back tomorrow with PW recs.    No Known Allergies   Current Outpatient Prescriptions on File Prior to Visit  Medication Sig Dispense Refill  . aspirin 81 MG tablet Take 81 mg by mouth daily.        Marland Kitchen. azithromycin (ZITHROMAX) 250 MG tablet Take 2 tabs once, then 1 daily  6 tablet  0  . benzonatate (TESSALON PERLES) 100 MG capsule Take 1 capsule (100 mg total) by mouth 3 (three) times daily as needed for cough.  20 capsule  0  . loratadine (CLARITIN) 10 MG tablet Take 10 mg by mouth daily.      . metoprolol tartrate (LOPRESSOR) 25 MG tablet TAKE 1 TABLET BY MOUTH TWICE DAILY  60 tablet  0  . OVER THE COUNTER MEDICATION Super beta prostate - take 1 tablet by mouth twice daily      . pantoprazole (PROTONIX) 40 MG tablet TAKE 1 TABLET BY MOUTH DAILY  30 tablet  2  . Pirfenidone (ESBRIET) 267 MG CAPS Take 3 capsules by mouth 3 (three) times daily.  270 capsule  6  . pravastatin (PRAVACHOL) 40 MG tablet TAKE 1 TABLET BY MOUTH ONCE DAILY AT BEDTIME  90 tablet  0  . predniSONE (DELTASONE) 10 MG tablet Take 4 tabs po x 3 days, then 3 x 3 days, then 2 x 3 days, then 1 x 3 days then stop.  30 tablet  0  . traMADol (ULTRAM) 50 MG tablet Take 1 tablet (50 mg total) by mouth every 8 (eight) hours as needed for pain.  45 tablet  1  . [DISCONTINUED] tadalafil (CIALIS) 20 MG tablet Take 20 mg by mouth daily as needed. Take one about 1-2 hours before sexual intercourse        No  current facility-administered medications on file prior to visit.

## 2013-07-09 NOTE — Telephone Encounter (Signed)
Needs ov in [place] any provider TOMORROW 6/16!

## 2013-07-10 ENCOUNTER — Ambulatory Visit (INDEPENDENT_AMBULATORY_CARE_PROVIDER_SITE_OTHER): Payer: Medicare Other | Admitting: Adult Health

## 2013-07-10 ENCOUNTER — Encounter: Payer: Self-pay | Admitting: Adult Health

## 2013-07-10 VITALS — BP 100/58 | HR 89 | Temp 98.1°F | Ht 73.0 in | Wt 226.4 lb

## 2013-07-10 DIAGNOSIS — J209 Acute bronchitis, unspecified: Secondary | ICD-10-CM | POA: Diagnosis not present

## 2013-07-10 MED ORDER — PREDNISONE 10 MG PO TABS: ORAL_TABLET | ORAL | Status: DC

## 2013-07-10 MED ORDER — AMOXICILLIN-POT CLAVULANATE 875-125 MG PO TABS: 1.0000 | ORAL_TABLET | Freq: Two times a day (BID) | ORAL | Status: DC

## 2013-07-10 NOTE — Assessment & Plan Note (Addendum)
Slow to resolve flare with sinusitis in setting of pulmonary fibrosis  cxr w/ no acute ov   Plan  Augmentin 875 mg twice daily for 10 days, take with food. Prednisone 20 mg daily for 1 week then 10 mg daily and hold at this dose until seen back in office. Mucinex DM twice daily as needed. For cough and congestion. Saline nasal spray as needed. For nasal congestion. Please contact office for sooner follow up if symptoms do not improve or worsen or seek emergency care  Follow up Dr. Delford FieldWright  In 4 weeks and As needed

## 2013-07-10 NOTE — Progress Notes (Signed)
Subjective:    Patient ID: [name], male    DOB: 1934-12-27, 79 y.o.   MRN: [id number]  HPI  79 yo male with known hx of Bronchiolitis obliterans organized pneumonia with progression of interstitial fibrosis. On chronic O2 at  [facility] 03/2013  Pulmonary rehab completed 10/2012 Hx of severe AS s/p AVR , CAD   07/10/2013 Acute OV  Complains of 2 weeks of PW pt here for head congestion w/ green mucus, PND, prod cough with green mucus, loss of appetite, increased SOB, wheezing w/ lying down, some tightness x2 weeks . Called in zpack and pred pack without much improvement.  CXR 07/08/13 w/ chronic changes in ER.  Given tessalon in ER  Patient denies any hemoptysis, orthopnea, PND, or increased leg swelling.   Review of Systems  Constitutional:   No  weight loss, night sweats,  Fevers, chills,  +fatigue, or  lassitude.  HEENT:   No headaches,  Difficulty swallowing,  Tooth/dental problems, or  Sore throat,                No sneezing, itching, ear ache,  +nasal congestion, post nasal drip,   CV:  No chest pain,  Orthopnea, PND, swelling in lower extremities, anasarca, dizziness, palpitations, syncope.   GI  No heartburn, indigestion, abdominal pain, nausea, vomiting, diarrhea, change in bowel habits, loss of appetite, bloody stools.   Resp:    No chest wall deformity  Skin: no rash or lesions.  GU: no dysuria, change in color of urine, no urgency or frequency.  No flank pain, no hematuria   MS:  No joint pain or swelling.  No decreased range of motion.  No back pain.  Psych:  No change in mood or affect. No depression or anxiety.  No memory loss.      Objective:   Physical Exam There were no vitals taken for this visit.  GEN: A/Ox3; pleasant , NAD, elderly   HEENT:  [place]/AT,  EACs-clear, TMs-wnl, NOSE-clear discharge  THROAT-clear, no lesions, no postnasal drip or exudate noted. Dentures   NECK:  Supple w/ fair ROM; no JVD; normal carotid impulses w/o bruits; no  thyromegaly or nodules palpated; no lymphadenopathy.  RESP  Bibasilar crackles lear  P & A; w/o, wheezes/ rales/ or rhonchi.no accessory muscle use, no dullness to percussion  CARD:  RRR, no m/r/g  , no peripheral edema, pulses intact, no cyanosis or clubbing.  GI:   Soft & nt; nml bowel sounds; no organomegaly or masses detected.  Musco: Warm bil, no deformities or joint swelling noted.   Neuro: alert, no focal deficits noted.    Skin: Warm, no lesions or rashes  Lab Results  Component Value Date   ALT 17 05/28/2013   AST 25 05/28/2013   ALKPHOS 62 05/28/2013   BILITOT 0.6 05/28/2013    Lab Results  Component Value Date   WBC 9.9 05/28/2013   HGB 11.2* 05/28/2013   HCT 33.7* 05/28/2013   MCV 83.4 05/28/2013   PLT 176.0 05/28/2013  .   Assessment & Plan:   No problem-specific assessment & plan notes found for this encounter.   Updated Medication List Outpatient Encounter Prescriptions as of 07/10/2013  Medication Sig  . aspirin 81 MG tablet Take 81 mg by mouth daily.    Marland Kitchen. azithromycin (ZITHROMAX) 250 MG tablet Take 2 tabs once, then 1 daily  . benzonatate (TESSALON PERLES) 100 MG capsule Take 1 capsule (100 mg total) by mouth 3 (three)  times daily as needed for cough.  . loratadine (CLARITIN) 10 MG tablet Take 10 mg by mouth daily.  . metoprolol tartrate (LOPRESSOR) 25 MG tablet TAKE 1 TABLET BY MOUTH TWICE DAILY  . OVER THE COUNTER MEDICATION Super beta prostate - take 1 tablet by mouth twice daily  . pantoprazole (PROTONIX) 40 MG tablet TAKE 1 TABLET BY MOUTH DAILY  . Pirfenidone (ESBRIET) 267 MG CAPS Take 3 capsules by mouth 3 (three) times daily.  . pravastatin (PRAVACHOL) 40 MG tablet TAKE 1 TABLET BY MOUTH ONCE DAILY AT BEDTIME  . predniSONE (DELTASONE) 10 MG tablet Take 4 tabs po x 3 days, then 3 x 3 days, then 2 x 3 days, then 1 x 3 days then stop.  . traMADol (ULTRAM) 50 MG tablet Take 1 tablet (50 mg total) by mouth every 8 (eight) hours as needed for pain.

## 2013-07-10 NOTE — Patient Instructions (Signed)
Augmentin 875 mg twice daily for 10 days, take with food. Prednisone 20 mg daily for 1 week then 10 mg daily and hold at this dose until seen back in office. Mucinex DM twice daily as needed. For cough and congestion. Saline nasal spray as needed. For nasal congestion. Please contact office for sooner follow up if symptoms do not improve or worsen or seek emergency care  Follow up Dr. Delford FieldWright  In 4 weeks and As needed

## 2013-07-10 NOTE — Telephone Encounter (Signed)
Pt scheduled for appt with TP at 430 today in [place] Nothing further needed.

## 2013-07-10 NOTE — Telephone Encounter (Signed)
lmomtcb x1 

## 2013-07-17 ENCOUNTER — Encounter (HOSPITAL_COMMUNITY): Payer: Self-pay | Admitting: Emergency Medicine

## 2013-07-17 ENCOUNTER — Emergency Department (HOSPITAL_COMMUNITY): Payer: Medicare Other

## 2013-07-17 ENCOUNTER — Observation Stay (HOSPITAL_COMMUNITY)
Admission: EM | Admit: 2013-07-17 | Discharge: 2013-07-19 | Disposition: A | Discharge status: Home / Self Care | Payer: Medicare Other | Attending: Family Medicine | Admitting: Family Medicine

## 2013-07-17 DIAGNOSIS — N529 Male erectile dysfunction, unspecified: Secondary | ICD-10-CM

## 2013-07-17 DIAGNOSIS — I482 Chronic atrial fibrillation, unspecified: Secondary | ICD-10-CM

## 2013-07-17 DIAGNOSIS — D649 Anemia, unspecified: Secondary | ICD-10-CM | POA: Diagnosis not present

## 2013-07-17 DIAGNOSIS — I359 Nonrheumatic aortic valve disorder, unspecified: Secondary | ICD-10-CM | POA: Diagnosis present

## 2013-07-17 DIAGNOSIS — R55 Syncope and collapse: Secondary | ICD-10-CM | POA: Diagnosis not present

## 2013-07-17 DIAGNOSIS — R404 Transient alteration of awareness: Secondary | ICD-10-CM | POA: Diagnosis not present

## 2013-07-17 DIAGNOSIS — Z9181 History of falling: Secondary | ICD-10-CM | POA: Diagnosis not present

## 2013-07-17 DIAGNOSIS — I4891 Unspecified atrial fibrillation: Secondary | ICD-10-CM | POA: Diagnosis not present

## 2013-07-17 DIAGNOSIS — I4819 Other persistent atrial fibrillation: Secondary | ICD-10-CM | POA: Diagnosis present

## 2013-07-17 DIAGNOSIS — I951 Orthostatic hypotension: Secondary | ICD-10-CM | POA: Diagnosis not present

## 2013-07-17 DIAGNOSIS — Z952 Presence of prosthetic heart valve: Secondary | ICD-10-CM

## 2013-07-17 DIAGNOSIS — J8409 Other alveolar and parieto-alveolar conditions: Secondary | ICD-10-CM | POA: Diagnosis not present

## 2013-07-17 DIAGNOSIS — M25532 Pain in left wrist: Secondary | ICD-10-CM

## 2013-07-17 DIAGNOSIS — R5383 Other fatigue: Secondary | ICD-10-CM | POA: Diagnosis not present

## 2013-07-17 DIAGNOSIS — N401 Enlarged prostate with lower urinary tract symptoms: Secondary | ICD-10-CM | POA: Diagnosis not present

## 2013-07-17 DIAGNOSIS — R7309 Other abnormal glucose: Secondary | ICD-10-CM

## 2013-07-17 DIAGNOSIS — J8410 Pulmonary fibrosis, unspecified: Secondary | ICD-10-CM | POA: Diagnosis not present

## 2013-07-17 DIAGNOSIS — Z954 Presence of other heart-valve replacement: Secondary | ICD-10-CM | POA: Diagnosis not present

## 2013-07-17 DIAGNOSIS — IMO0001 Reserved for inherently not codable concepts without codable children: Secondary | ICD-10-CM

## 2013-07-17 DIAGNOSIS — I10 Essential (primary) hypertension: Secondary | ICD-10-CM | POA: Insufficient documentation

## 2013-07-17 DIAGNOSIS — R110 Nausea: Secondary | ICD-10-CM | POA: Insufficient documentation

## 2013-07-17 DIAGNOSIS — R030 Elevated blood-pressure reading, without diagnosis of hypertension: Secondary | ICD-10-CM

## 2013-07-17 DIAGNOSIS — Z87891 Personal history of nicotine dependence: Secondary | ICD-10-CM | POA: Diagnosis not present

## 2013-07-17 DIAGNOSIS — R634 Abnormal weight loss: Secondary | ICD-10-CM | POA: Diagnosis not present

## 2013-07-17 DIAGNOSIS — R3919 Other difficulties with micturition: Secondary | ICD-10-CM | POA: Insufficient documentation

## 2013-07-17 DIAGNOSIS — Z7982 Long term (current) use of aspirin: Secondary | ICD-10-CM | POA: Diagnosis not present

## 2013-07-17 DIAGNOSIS — N138 Other obstructive and reflux uropathy: Secondary | ICD-10-CM | POA: Insufficient documentation

## 2013-07-17 DIAGNOSIS — E785 Hyperlipidemia, unspecified: Secondary | ICD-10-CM | POA: Diagnosis present

## 2013-07-17 DIAGNOSIS — K219 Gastro-esophageal reflux disease without esophagitis: Secondary | ICD-10-CM | POA: Diagnosis present

## 2013-07-17 DIAGNOSIS — R630 Anorexia: Secondary | ICD-10-CM | POA: Diagnosis not present

## 2013-07-17 DIAGNOSIS — E861 Hypovolemia: Secondary | ICD-10-CM | POA: Insufficient documentation

## 2013-07-17 DIAGNOSIS — J8489 Other specified interstitial pulmonary diseases: Secondary | ICD-10-CM | POA: Diagnosis present

## 2013-07-17 DIAGNOSIS — R011 Cardiac murmur, unspecified: Secondary | ICD-10-CM | POA: Diagnosis present

## 2013-07-17 DIAGNOSIS — J329 Chronic sinusitis, unspecified: Secondary | ICD-10-CM | POA: Insufficient documentation

## 2013-07-17 DIAGNOSIS — R5381 Other malaise: Secondary | ICD-10-CM | POA: Diagnosis not present

## 2013-07-17 DIAGNOSIS — H9193 Unspecified hearing loss, bilateral: Secondary | ICD-10-CM

## 2013-07-17 HISTORY — DX: Gastro-esophageal reflux disease without esophagitis: K21.9

## 2013-07-17 HISTORY — DX: Syncope and collapse: R55

## 2013-07-17 LAB — CBC
HCT: 27.4 % — ABNORMAL LOW (ref 39.0–52.0)
HEMATOCRIT: 31.7 % — AB (ref 39.0–52.0)
Hemoglobin: 10.6 g/dL — ABNORMAL LOW (ref 13.0–17.0)
Hemoglobin: 9.3 g/dL — ABNORMAL LOW (ref 13.0–17.0)
MCH: 26.2 pg (ref 26.0–34.0)
MCH: 26.4 pg (ref 26.0–34.0)
MCHC: 33.4 g/dL (ref 30.0–36.0)
MCHC: 33.9 g/dL (ref 30.0–36.0)
MCV: 78.5 fL (ref 78.0–100.0)
MCV: 77.8 fL — ABNORMAL LOW (ref 78.0–100.0)
PLATELETS: 173 10*3/uL (ref 150–400)
Platelets: ADEQUATE 10*3/uL (ref 150–400)
RBC: 4.04 MIL/uL — ABNORMAL LOW (ref 4.22–5.81)
RBC: 3.52 MIL/uL — AB (ref 4.22–5.81)
RDW: 16.4 % — AB (ref 11.5–15.5)
RDW: 16.7 % — AB (ref 11.5–15.5)
WBC: 15.3 10*3/uL — AB (ref 4.0–10.5)
WBC: 12.6 10*3/uL — AB (ref 4.0–10.5)

## 2013-07-17 LAB — BASIC METABOLIC PANEL
BUN: 24 mg/dL — ABNORMAL HIGH (ref 6–23)
CHLORIDE: 98 meq/L (ref 96–112)
CO2: 26 mEq/L (ref 19–32)
Calcium: 9 mg/dL (ref 8.4–10.5)
Creatinine, Ser: 1.08 mg/dL (ref 0.50–1.35)
GFR calc non Af Amer: 65 mL/min — ABNORMAL LOW (ref >90)
GFR, EST AFRICAN AMERICAN: 75 mL/min — AB (ref >90)
Glucose, Bld: 109 mg/dL — ABNORMAL HIGH (ref 70–99)
POTASSIUM: 4.9 meq/L (ref 3.7–5.3)
Sodium: 136 mEq/L — ABNORMAL LOW (ref 137–147)

## 2013-07-17 LAB — URINALYSIS, ROUTINE W REFLEX MICROSCOPIC
BILIRUBIN URINE: NEGATIVE
GLUCOSE, UA: NEGATIVE mg/dL
Ketones, ur: NEGATIVE mg/dL
Nitrite: NEGATIVE
Protein, ur: NEGATIVE mg/dL
SPECIFIC GRAVITY, URINE: 1.022 (ref 1.005–1.030)
Urobilinogen, UA: 0.2 mg/dL (ref 0.0–1.0)
pH: 5.5 (ref 5.0–8.0)

## 2013-07-17 LAB — I-STAT TROPONIN, ED: Troponin i, poc: 0.02 ng/mL (ref 0.00–0.08)

## 2013-07-17 LAB — HEPATIC FUNCTION PANEL
ALT: 53 U/L (ref 0–53)
AST: 37 U/L (ref 0–37)
Albumin: 2.7 g/dL — ABNORMAL LOW (ref 3.5–5.2)
Alkaline Phosphatase: 59 U/L (ref 39–117)
BILIRUBIN TOTAL: 0.4 mg/dL (ref 0.3–1.2)
Bilirubin, Direct: <0.2 mg/dL (ref 0.0–0.3)
Total Protein: 5.9 g/dL — ABNORMAL LOW (ref 6.0–8.3)

## 2013-07-17 LAB — LACTATE DEHYDROGENASE: LDH: 345 U/L — AB (ref 94–250)

## 2013-07-17 LAB — URINE MICROSCOPIC-ADD ON

## 2013-07-17 LAB — CREATININE, SERUM
CREATININE: 0.95 mg/dL (ref 0.50–1.35)
GFR calc non Af Amer: 79 mL/min — ABNORMAL LOW (ref >90)

## 2013-07-17 LAB — TECHNOLOGIST SMEAR REVIEW

## 2013-07-17 MED ORDER — ASPIRIN 81 MG PO TABS
81.0000 mg | ORAL_TABLET | Freq: Every day | ORAL | Status: DC
  Administered 2013-07-17: 81 mg via ORAL
  Filled 2013-07-17 (×2): qty 1

## 2013-07-17 MED ORDER — SODIUM CHLORIDE 0.9 % IV SOLN
INTRAVENOUS | Status: DC
  Administered 2013-07-17 – 2013-07-18 (×2): via INTRAVENOUS

## 2013-07-17 MED ORDER — ACETAMINOPHEN 325 MG PO TABS
650.0000 mg | ORAL_TABLET | Freq: Four times a day (QID) | ORAL | Status: DC | PRN
Start: 2013-07-17 — End: 2013-07-19

## 2013-07-17 MED ORDER — SODIUM CHLORIDE 0.9 % IJ SOLN: 3.0000 mL | Freq: Two times a day (BID) | INTRAMUSCULAR | Status: DC

## 2013-07-17 MED ORDER — AMOXICILLIN-POT CLAVULANATE 875-125 MG PO TABS
1.0000 | ORAL_TABLET | Freq: Two times a day (BID) | ORAL | Status: DC
  Administered 2013-07-17 – 2013-07-19 (×5): 1 via ORAL
  Filled 2013-07-17 (×6): qty 1

## 2013-07-17 MED ORDER — PANTOPRAZOLE SODIUM 40 MG PO TBEC
40.0000 mg | DELAYED_RELEASE_TABLET | Freq: Every day | ORAL | Status: DC
  Administered 2013-07-17 – 2013-07-19 (×3): 40 mg via ORAL
  Filled 2013-07-17 (×3): qty 1

## 2013-07-17 MED ORDER — ONDANSETRON HCL 4 MG/2ML IJ SOLN: 4.0000 mg | Freq: Four times a day (QID) | INTRAMUSCULAR | Status: DC | PRN

## 2013-07-17 MED ORDER — SIMVASTATIN 20 MG PO TABS
20.0000 mg | ORAL_TABLET | Freq: Every day | ORAL | Status: DC
  Administered 2013-07-17 – 2013-07-18 (×2): 20 mg via ORAL
  Filled 2013-07-17 (×3): qty 1

## 2013-07-17 MED ORDER — SODIUM CHLORIDE 0.9 % IV BOLUS (SEPSIS)
1000.0000 mL | Freq: Once | INTRAVENOUS | Status: AC
  Administered 2013-07-17: 1000 mL via INTRAVENOUS

## 2013-07-17 MED ORDER — PIRFENIDONE 267 MG PO CAPS: 3.0000 | ORAL_CAPSULE | Freq: Three times a day (TID) | ORAL | Status: DC

## 2013-07-17 MED ORDER — LORATADINE 10 MG PO TABS
10.0000 mg | ORAL_TABLET | Freq: Every day | ORAL | Status: DC
  Administered 2013-07-17 – 2013-07-19 (×3): 10 mg via ORAL
  Filled 2013-07-17 (×3): qty 1

## 2013-07-17 MED ORDER — PREDNISONE 20 MG PO TABS
20.0000 mg | ORAL_TABLET | Freq: Every day | ORAL | Status: DC
  Administered 2013-07-18 – 2013-07-19 (×2): 20 mg via ORAL
  Filled 2013-07-17 (×3): qty 1

## 2013-07-17 MED ORDER — SODIUM CHLORIDE 0.9 % IV SOLN
Freq: Once | INTRAVENOUS | Status: AC
  Administered 2013-07-17: 09:00:00 via INTRAVENOUS

## 2013-07-17 MED ORDER — HEPARIN SODIUM (PORCINE) 5000 UNIT/ML IJ SOLN
5000.0000 [IU] | Freq: Three times a day (TID) | INTRAMUSCULAR | Status: DC
  Administered 2013-07-18 – 2013-07-19 (×3): 5000 [IU] via SUBCUTANEOUS
  Filled 2013-07-17 (×9): qty 1

## 2013-07-17 MED ORDER — ACETAMINOPHEN 650 MG RE SUPP: 650.0000 mg | Freq: Four times a day (QID) | RECTAL | Status: DC | PRN

## 2013-07-17 MED ORDER — PIRFENIDONE 267 MG PO CAPS
3.0000 | ORAL_CAPSULE | Freq: Three times a day (TID) | ORAL | Status: DC
  Administered 2013-07-17: 3 via ORAL
  Filled 2013-07-17: qty 3

## 2013-07-17 MED ORDER — ONDANSETRON HCL 4 MG PO TABS: 4.0000 mg | ORAL_TABLET | Freq: Four times a day (QID) | ORAL | Status: DC | PRN

## 2013-07-17 NOTE — ED Notes (Signed)
Patient transported to X-ray 

## 2013-07-17 NOTE — ED Provider Notes (Signed)
CSN: [id number]     Arrival date & time 07/17/13  [id number] History   First MD Initiated Contact with Patient 07/17/13 [phone]     Chief Complaint  Patient presents with  . Loss of Consciousness  . Weakness     (Consider location/radiation/quality/duration/timing/severity/associated sxs/prior Treatment) HPI [name] is a 79 y.o. male who presents to emergency department after syncopal episode at home. Patient states he got up this morning and went to the bathroom to urinate, after he finished urinating he became dizzy, lightheaded, and syncopized. According to his wife, she heard patient fall down to the ground and came to him and he was unresponsive at a time. Patient was unconscious for 1-2 min, when came to it, was able to get up and get into bed. Wife called ems. Patient reports history of pulmonary fibrosis and states was just seen by his pulmonologist a week ago for acute bronchitis and sinusitis. He states prior to that he finished a course of Z-Pak, and was switched to Augmentin, and prednisone. States he's feeling better now. He also reports being started a new medication for his pulmonary fibrosis approximately 3 months ago, pirfenidona. States since then he has been feeling weak, loss of appetite, approximately 25 pound weight loss. States it is taking him longer to do daily activities and now unable to walk longer than from bed to the bathroom without getting fatigued. States prior to starting on this medication he use to exercise or walk 5 times a week which he is unable to do now. Patient states currently laying down he feels better but states he feels like he is going to be dizzy if he stands up. He denies any chest pain, shortness of breath, abdominal pain, back pain, swelling of extremities.   Past Medical History  Diagnosis Date  . Hypertension   . Hyperlipidemia   . Aortic stenosis     AVR 2011  . Dermatitis     poison ivy  . Cleft palate   . Interstitial lung disease   .  Pulmonary fibrosis    Past Surgical History  Procedure Laterality Date  . Bilat cataract sx  2004  . Cholecystectomy  1999  . 7 surgeries for cleft palate  1938  . Oral surgery with bone graft from hip for cleft palate  1989  . Right groin hernia repair  1950's  . Rotator cuff tear, left shoulder    . Aortic valve replacement w/ pericardial tissue valve  10-15-09    Dr Barry Dieneswens   Family History  Problem Relation Age of Onset  . Heart attack Mother   . Cancer Father     lung and stomach  . Heart attack Father   . Cancer Sister     bone  . Diabetes Sister   . Heart murmur Brother   . Hypertension Brother   . Hyperlipidemia Brother   . Heart attack Brother    History  Substance Use Topics  . Smoking status: Former Smoker -- 1.50 packs/day for 5 years    Types: Cigarettes, Cigars    Quit date: 09/08/1977  . Smokeless tobacco: Never Used  . Alcohol Use: No    Review of Systems  Constitutional: Positive for activity change, appetite change and fatigue. Negative for fever and chills.  Respiratory: Positive for cough. Negative for chest tightness, shortness of breath and wheezing.   Cardiovascular: Negative for chest pain, palpitations and leg swelling.  Gastrointestinal: Negative for nausea, vomiting, abdominal pain, diarrhea and abdominal  distention.  Genitourinary: Negative for dysuria, urgency, frequency and hematuria.  Musculoskeletal: Negative for arthralgias, myalgias, neck pain and neck stiffness.  Skin: Negative for rash.  Allergic/Immunologic: Negative for immunocompromised state.  Neurological: Positive for dizziness, syncope and light-headedness. Negative for seizures, speech difficulty, weakness, numbness and headaches.  All other systems reviewed and are negative.     Allergies  Review of patient's allergies indicates no known allergies.  Home Medications   Prior to Admission medications   Medication Sig Start Date End Date Taking? Authorizing Ashwini Jago   amoxicillin-clavulanate (AUGMENTIN) 875-125 MG per tablet Take 1 tablet by mouth 2 (two) times daily. 07/10/13  Yes Tammy S Parrett, NP  aspirin 81 MG tablet Take 81 mg by mouth daily.     Yes Historical Mareesa Gathright, MD  benzonatate (TESSALON PERLES) 100 MG capsule Take 1 capsule (100 mg total) by mouth 3 (three) times daily as needed for cough. 07/08/13  Yes Audree CamelScott T Goldston, MD  loratadine (CLARITIN) 10 MG tablet Take 10 mg by mouth daily.   Yes Historical Reshanda Lewey, MD  metoprolol tartrate (LOPRESSOR) 25 MG tablet Take 25 mg by mouth 2 (two) times daily.   Yes Historical Reiss Mowrey, MD  OVER THE COUNTER MEDICATION Super beta prostate - take 1 tablet by mouth twice daily   Yes Historical Irys Nigh, MD  pantoprazole (PROTONIX) 40 MG tablet Take 40 mg by mouth daily.   Yes Historical Nel Stoneking, MD  Pirfenidone (ESBRIET) 267 MG CAPS Take 3 capsules by mouth 3 (three) times daily. 06/07/13  Yes Storm FriskPatrick E Wright, MD  pravastatin (PRAVACHOL) 40 MG tablet Take 40 mg by mouth daily.   Yes Historical Jovan Colligan, MD  predniSONE (DELTASONE) 10 MG tablet Take 20 mg by mouth daily with breakfast.   Yes Historical Camellia Popescu, MD   BP 92/56  Pulse 73  Temp(Src) 98.2 F (36.8 C) (Oral)  Resp 14  SpO2 99% Physical Exam  Nursing note and vitals reviewed. Constitutional: He is oriented to person, place, and time. He appears well-developed and well-nourished. No distress.  HENT:  Head: Normocephalic and atraumatic.  Eyes: Conjunctivae and EOM are normal. Pupils are equal, round, and reactive to light.  Neck: Normal range of motion. Neck supple.  Cardiovascular: Normal rate and regular rhythm.   Murmur heard. Pulmonary/Chest: Effort normal. No respiratory distress. He has no wheezes. He has rales.  Bilateral rales at bases  Abdominal: Soft. Bowel sounds are normal. He exhibits no distension. There is no tenderness. There is no rebound.  Musculoskeletal: He exhibits no edema.  Neurological: He is alert and oriented to  person, place, and time.  5/5 and equal upper and lower extremity strength bilaterally. Equal grip strength bilaterally. Normal finger to nose and heel to shin. No pronator drift.   Skin: Skin is warm and dry.    ED Course  Procedures (including critical care time) Labs Review Labs Reviewed  CBC - Abnormal; Notable for the following:    WBC 15.3 (*)    RBC 4.04 (*)    Hemoglobin 10.6 (*)    HCT 31.7 (*)    RDW 16.4 (*)    All other components within normal limits  BASIC METABOLIC PANEL - Abnormal; Notable for the following:    Sodium 136 (*)    Glucose, Bld 109 (*)    BUN 24 (*)    GFR calc non Af Amer 65 (*)    GFR calc Af Amer 75 (*)    All other components within normal limits  URINE CULTURE  URINALYSIS, ROUTINE W REFLEX MICROSCOPIC  I-STAT TROPOININ, ED    Imaging Review Dg Chest 2 View  07/17/2013   CLINICAL DATA:  Loss of consciousness. Weakness for 1 day. Aortic valve replacement in 2011.  EXAM: CHEST  2 VIEW  COMPARISON:  07/08/2013.  FINDINGS: Median sternotomy and bioprosthetic aortic valve. Cholecystectomy clips are present in the right upper quadrant. Diffuse interstitial opacity is present compatible with pulmonary fibrosis demonstrated on prior CT. No definite superimposed airspace opacity. The cardiopericardial silhouette is within normal limits for projection. Mediastinal contours are unchanged. Unchanged rightward tracheal deviation. Chronic right AC joint separation.  IMPRESSION: Chronic interstitial lung disease without acute cardiopulmonary disease.   Electronically Signed   By: Andreas Newport M.D.   On: 07/17/2013 07:24     EKG Interpretation   Date/Time:  Tuesday July 17 2013 06:35:02 EDT Ventricular Rate:  77 PR Interval:  240 QRS Duration: 96 QT Interval:  390 QTC Calculation: 441 R Axis:   62 Text Interpretation:  Sinus rhythm Prolonged PR interval Inferior infarct,  age indeterminate Similar to prior Confirmed by [facility]  MD, BLAIR ([id number])  on  07/17/2013 7:09:27 AM      MDM   Final diagnoses:  Syncope, unspecified syncope type  Orthostatic hypotension    Pt with syncopal episode. He is orthostatic, bp dropped to 60s systolic upon standing. Labs already obtained, unremarkable. BUN elevated. Most likely dehydration vs medication reaction. Pirfenidone does have side effects consistent with loss of appetitive, fatigue, weight loss. Pt received 2L of NS at this time, feeling better. ECG unchanged, trop negative. He denies any pain or SOB at this time.   Will admit to the hospital.     Lottie Mussel, PA-C 07/18/13 [phone]

## 2013-07-17 NOTE — ED Notes (Signed)
Patient got up this am to use the bathroom, had a syncopal episode.  Patient woke up when EMS arrived, patient got himself up and felt fine.  Patient has started a new med for pulmonary fibrosis.  For one week he has a decrease in appetite.  Patient now with some generalized weakness.

## 2013-07-17 NOTE — ED Notes (Signed)
PA aware of orthostatic results

## 2013-07-17 NOTE — ED Notes (Signed)
PA at bedside.

## 2013-07-17 NOTE — H&P (Signed)
Date: 07/17/2013               Patient Name:  [name] MRN: [id number]  DOB: 1935/01/25 Age / Sex: 79 y.o., male   PCP: Laren Boom, DO         Medical Service: Internal Medicine Teaching Service         Attending Physician: Dr. Aletta Edouard, MD    First Contact: Dr. Cleda Daub Pager: [phone]  Second Contact: Dr. Jorje Guild Pager: [phone]       After Hours (After 5p/  First Contact Pager: [phone]  weekends / holidays): Second Contact Pager: [phone]   Chief Complaint: passed out  History of Present Illness: [name] is a 79 yo with who presented to [facility] ED after syncopal episode this morning.  Patient and wife state that he awoke to use the bathroom and after urinating as he was washing his hands he felt weak, lightheaded and dizzy resulting in him falling to the floor on his bottom but hit his head on the door. Wife reports pt was initially unresponsive with eyes partially opened. No body jerking movements.  Patient took ~1-3 minutes to respond to his wife who immediately called 911.  He was able to get up from the floor without assistance. Reports that he remembers the entire incident and actually falling to the floor. He states that early this week he felt lightheaded when getting up to go check the mail which was similar to this incident but he was able to "hold on to the mailbox". Denies one-sided weakness, slurred speech, visual changes, or inability to speak.  Of note, he has been recently treated for respiratory infection per his Pulmonologist Clinic for sinusitis and BOOP over the past 2-3 weeks. This was originally treated with "Z-pac then a weeks" course of Augmentin with concurrent prednisone taper. He denies worsening shortness of breath, no chest pain, no diarrhea or constipation. Reports that since starting new medication in March 2015 for pulmonary fibrosis has been nauseous with decreased appetite which has worsened over the past several weeks. Has had  headaches since issues with sinusitis which is unusual for him. Reports mucus and a "bit of blood" in his urine today. Denies fevers, some chills at night.    Meds: No current facility-administered medications for this encounter.   Current Outpatient Prescriptions  Medication Sig Dispense Refill  . amoxicillin-clavulanate (AUGMENTIN) 875-125 MG per tablet Take 1 tablet by mouth 2 (two) times daily.  20 tablet  0  . aspirin 81 MG tablet Take 81 mg by mouth daily.        . benzonatate (TESSALON PERLES) 100 MG capsule Take 1 capsule (100 mg total) by mouth 3 (three) times daily as needed for cough.  20 capsule  0  . loratadine (CLARITIN) 10 MG tablet Take 10 mg by mouth daily.      . metoprolol tartrate (LOPRESSOR) 25 MG tablet Take 25 mg by mouth 2 (two) times daily.      Marland Kitchen OVER THE COUNTER MEDICATION Super beta prostate - take 1 tablet by mouth twice daily      . pantoprazole (PROTONIX) 40 MG tablet Take 40 mg by mouth daily.      . Pirfenidone (ESBRIET) 267 MG CAPS Take 3 capsules by mouth 3 (three) times daily.  270 capsule  6  . pravastatin (PRAVACHOL) 40 MG tablet Take 40 mg by mouth daily.      . predniSONE (DELTASONE) 10 MG tablet  Take 20 mg by mouth daily with breakfast.      . [DISCONTINUED] tadalafil (CIALIS) 20 MG tablet Take 20 mg by mouth daily as needed. Take one about 1-2 hours before sexual intercourse         Allergies: Allergies as of 07/17/2013  . (No Known Allergies)   Past Medical History  Diagnosis Date  . Hypertension   . Hyperlipidemia   . Aortic stenosis     AVR 2011  . Dermatitis     poison ivy  . Cleft palate   . Interstitial lung disease   . Pulmonary fibrosis    Past Surgical History  Procedure Laterality Date  . Bilat cataract sx  2004  . Cholecystectomy  1999  . 7 surgeries for cleft palate  1938  . Oral surgery with bone graft from hip for cleft palate  1989  . Right groin hernia repair  1950's  . Rotator cuff tear, left shoulder    . Aortic  valve replacement w/ pericardial tissue valve  10-15-09    Dr Barry Dieneswens   Family History  Problem Relation Age of Onset  . Heart attack Mother   . Cancer Father     lung and stomach  . Heart attack Father   . Cancer Sister     bone  . Diabetes Sister   . Heart murmur Brother   . Hypertension Brother   . Hyperlipidemia Brother   . Heart attack Brother    History   Social History  . Marital Status: Married    Spouse Name: N/A    Number of Children: N/A  . Years of Education: N/A   Occupational History  . Retired    Social History Main Topics  . Smoking status: Former Smoker -- 1.50 packs/day for 5 years    Types: Cigarettes, Cigars    Quit date: 09/08/1977  . Smokeless tobacco: Never Used  . Alcohol Use: No  . Drug Use: No  . Sexual Activity: Not on file   Other Topics Concern  . Not on file   Social History Narrative  . No narrative on file    Review of Systems: Constitutional:  Endorses chills, decreased appetite, fatigue Denies fever, diaphoresis  HEENT: Endorses resolving sinus congestion, Denies sore throat, rhinorrhea, sneezing, tinnitis  Respiratory: Endorses resolving cough, Denies worsening SOB, DOE, chest tightness, and wheezing.  Cardiovascular: Denies chest pain, palpitations and leg swelling.  Gastrointestinal: Endorses nausea, Denies vomiting, abdominal pain, diarrhea, constipation, blood in stool and abdominal distention.  Genitourinary: Endorses mucus and small amount of blood in urine x1 today, Denies dysuria, urgency, frequency, flank pain and difficulty urinating.  Musculoskeletal: Denies myalgias, back pain, joint swelling, arthralgias and gait problem.   Skin: Denies pallor, rash and wound.  Neurological: Endorse lightheadedness, syncope, and mild headache since sinus problems Denies seizures, numbness  Hematological: Denies easy bruising  Psychiatric/ Behavioral: Denies  confusion, nervousness, sleep disturbance and agitation.     Physical  Exam: Blood pressure 102/53, pulse 73, temperature 98.6 F (37 C), temperature source Oral, resp. rate 12, SpO2 100.00%. General: Well-developed, well-nourished, Pleasant, White elderly male, in no acute distress; wife at bedside HEENT: Normocephalic, atraumatic, bald, ~1in keratotic plaque to right temporal region, PERRLA, EOMI,  Moist mucous membranes, poor dentition, supple neck Lungs: Normal respiratory effort. Diffuse crackles bilaterally from apices to bases  Heart: normal rate, regular rhythm, 2/6 SEM  Abdomen: BS normoactive. Soft, Nondistended, non-tender. No masses or organomegaly appreciated. Extremities: No pretibial edema, distal pulses  intact Neurologic: grossly non-focal, strength 5/5 Upper and lower extremities bilaterally, alert and oriented x3, appropriate and cooperative throughout examination.    Lab results: Basic Metabolic Panel:  Recent Labs  16/10/9604/23/15 0640  NA 136*  K 4.9  CL 98  CO2 26  GLUCOSE 109*  BUN 24*  CREATININE 1.08  CALCIUM 9.0   CBC:  Recent Labs  07/17/13 0640  WBC 15.3*  HGB 10.6*  HCT 31.7*  MCV 78.5  PLT PLATELET CLUMPS NOTED ON SMEAR, COUNT APPEARS ADEQUATE     Recent Labs  07/17/13 0900  COLORURINE YELLOW  LABSPEC 1.022  PHURINE 5.5  GLUCOSEU NEGATIVE  HGBUR SMALL*  BILIRUBINUR NEGATIVE  KETONESUR NEGATIVE  PROTEINUR NEGATIVE  UROBILINOGEN 0.2  NITRITE NEGATIVE  LEUKOCYTESUR TRACE*     Imaging results:  Dg Chest 2 View  07/17/2013   CLINICAL DATA:  Loss of consciousness. Weakness for 1 day. Aortic valve replacement in 2011.  EXAM: CHEST  2 VIEW  COMPARISON:  07/08/2013.  FINDINGS: Median sternotomy and bioprosthetic aortic valve. Cholecystectomy clips are present in the right upper quadrant. Diffuse interstitial opacity is present compatible with pulmonary fibrosis demonstrated on prior CT. No definite superimposed airspace opacity. The cardiopericardial silhouette is within normal limits for projection. Mediastinal  contours are unchanged. Unchanged rightward tracheal deviation. Chronic right AC joint separation.  IMPRESSION: Chronic interstitial lung disease without acute cardiopulmonary disease.   Electronically Signed   By: Andreas NewportGeoffrey  Lamke M.D.   On: 07/17/2013 07:24    Other results: EKG: normal sinus rhythm, prolonged PR 240 ms, TWI inferior leads unchanged from prior.  Assessment & Plan by Problem: [name] is a 79 yo male admitted with syncope in setting of post-micturition, orthostatic hypotension, s/p AV replacement, h/o paroxsymal atrial fibrillation and pulmonary fibrosis.  #1 Syncope, non-cardiac: likely multifactorial secondary to micturition, in setting of orthostatic hypotension from hypovolemia and beta blocker usage. Blood pressure 90/50 supine --> 63/40 standing on ED evaluation. Received 2L NS in ED with bp 109/57 on admission. Aortic valve replaced 2011 for aortic stenosis without complications, followed by Dr. Jens Somrenshaw of Cardiology. No palpitations or arrhythmia on telemetry or EKG. Last ECHO 05/2013 with EF 55-65%, normal systolic function, no Aortic Regurgitation -admit for observation -monitor on telemetry -cont IV fluids -monitor orthostatic bps -hold antihypertensives for now, may need titration to lower dosage at discharge, (2011) cardiac cath with normal coronaries  #2 Hematuria: unclear etiology, 3-6 RBCs on urine microscopy, pt reports small bit of blood when urinating today which is the first that he has noticed.  Pt with biologic valve ("cow") thus hemolysis from prosthetic heart valve on differential.  Platelets unable to be counted secondary to clumping thus will need repeat analysis. No complaints of flank pain or dysuria to indicate renal colic. Pt with h/o anemia per EPIC chart review but not on problem list. Hgb down slightly to 10.6 from 11.2 a month ago. H/o BPH (uses otc  'Beta Super Prostate' -repeat UA -check LDH, retic count, periph blood smear    #3  Bronchiolitis Obliterans Organized Pneumonia and Sinusitis, resolving: in setting of interstitial pulmonary fibrosis.  S/p Azithromycin and currently on Augmentin tx with prednisone taper.  -cont Augmentin course and prednisone 10 mg qd for now as pt was to begin taper today -hold Tessalon which may also contribute to nausea  #4 Interstitial Pulmonary Fibrosis: Managed by Dr. Delford FieldWright of Pulmonology.  Pt states that since starting new medication (Pirfenidone) several months ago, he has stabilized lung-wise but  has had nausea, poor appetite, and weight loss. Side effects reviewed in the litterature which are consistent with pt's complaints include nasea, URI , Headache, Dyspepsia/GERD, Dizziness, Sinusitis, Weight Decrease, and Decreased appetite -will cont Pirfenidone and defer management to Pulmonology -check liver enzymes including bilirubin levels  #5 Anemia, chronic: as noted above  Recent Labs Lab 07/17/13 0640  HGB 10.6*     #6 Hypertension: will hold antihypertensives for now -cont to monitor -cont IV fluids  #7 GERD: cont PPI -Protonix 40 mg qd  #8 Benign Prostatic Hypertrophy: hold OTC supplement of Beta Super Prostate (sitasterol) -recheck UA  #9 Hyperlipidemia: cont statin therapy -simvastatin 20 mg qd  DVT ppx: Hep SQ Code Full Dispo: Disposition is deferred at this time, awaiting improvement of current medical problems. Anticipated discharge in approximately 1-2 day(s).   The patient does have a current PCP Gregary Signs Hommel, DO) and does not need an [facility] hospital follow-up appointment after discharge.  The patient does not have transportation limitations that hinder transportation to clinic appointments.  Signed: Manuela Schwartz, MD 07/17/2013, 10:40 AM

## 2013-07-17 NOTE — ED Notes (Signed)
Admitting MD at bedside.

## 2013-07-17 NOTE — ED Notes (Signed)
Pt states he fell just after he finished urinating this am. Pt states he became dizzy and fell to the floor. Per spouse she was awakened by a loud noise and found pt lying on his back with his head against the bathroom door. Spouse reports pt was unconscious approx 5 mins, pt did not respond to verbal or painful stimuli.  Spouse also reports pt has had a cough for 2 weeks and recently seen at his Pulmonologist and prescribed Prednisone and Augmentin. Spouse also states pt has been weak, fatigue, loss of appetite, and a a known 12 lb weight loss in 2-3 weeks. And approx 25 lb weight loss over a 2 month period.

## 2013-07-18 ENCOUNTER — Telehealth: Payer: Self-pay | Admitting: Critical Care Medicine

## 2013-07-18 DIAGNOSIS — N400 Enlarged prostate without lower urinary tract symptoms: Secondary | ICD-10-CM

## 2013-07-18 DIAGNOSIS — R55 Syncope and collapse: Secondary | ICD-10-CM | POA: Diagnosis not present

## 2013-07-18 DIAGNOSIS — I10 Essential (primary) hypertension: Secondary | ICD-10-CM | POA: Diagnosis not present

## 2013-07-18 DIAGNOSIS — R319 Hematuria, unspecified: Secondary | ICD-10-CM

## 2013-07-18 DIAGNOSIS — J84112 Idiopathic pulmonary fibrosis: Secondary | ICD-10-CM

## 2013-07-18 DIAGNOSIS — J8409 Other alveolar and parieto-alveolar conditions: Secondary | ICD-10-CM | POA: Diagnosis not present

## 2013-07-18 DIAGNOSIS — E785 Hyperlipidemia, unspecified: Secondary | ICD-10-CM

## 2013-07-18 DIAGNOSIS — K219 Gastro-esophageal reflux disease without esophagitis: Secondary | ICD-10-CM

## 2013-07-18 LAB — BASIC METABOLIC PANEL
BUN: 17 mg/dL (ref 6–23)
CO2: 22 mEq/L (ref 19–32)
CREATININE: 1.01 mg/dL (ref 0.50–1.35)
Calcium: 8.4 mg/dL (ref 8.4–10.5)
Chloride: 99 mEq/L (ref 96–112)
GFR calc non Af Amer: 70 mL/min — ABNORMAL LOW (ref >90)
GFR, EST AFRICAN AMERICAN: 81 mL/min — AB (ref >90)
GLUCOSE: 99 mg/dL (ref 70–99)
Potassium: 4.5 mEq/L (ref 3.7–5.3)
Sodium: 136 mEq/L — ABNORMAL LOW (ref 137–147)

## 2013-07-18 LAB — URINE CULTURE
Colony Count: NO GROWTH
Culture: NO GROWTH

## 2013-07-18 LAB — CBC
HEMATOCRIT: 27.4 % — AB (ref 39.0–52.0)
Hemoglobin: 9.1 g/dL — ABNORMAL LOW (ref 13.0–17.0)
MCH: 26 pg (ref 26.0–34.0)
MCHC: 33.2 g/dL (ref 30.0–36.0)
MCV: 78.3 fL (ref 78.0–100.0)
Platelets: 172 10*3/uL (ref 150–400)
RBC: 3.5 MIL/uL — ABNORMAL LOW (ref 4.22–5.81)
RDW: 16.7 % — AB (ref 11.5–15.5)
WBC: 10.2 10*3/uL (ref 4.0–10.5)

## 2013-07-18 LAB — HAPTOGLOBIN: Haptoglobin: 144 mg/dL (ref 45–215)

## 2013-07-18 MED ORDER — ASPIRIN 81 MG PO CHEW
81.0000 mg | CHEWABLE_TABLET | Freq: Every day | ORAL | Status: DC
  Administered 2013-07-18 – 2013-07-19 (×2): 81 mg via ORAL
  Filled 2013-07-18: qty 1

## 2013-07-18 MED ORDER — ENSURE COMPLETE PO LIQD: 237.0000 mL | ORAL | Status: DC

## 2013-07-18 MED ORDER — BOOST / RESOURCE BREEZE PO LIQD
1.0000 | ORAL | Status: DC
  Administered 2013-07-19: 1 via ORAL

## 2013-07-18 NOTE — Telephone Encounter (Signed)
Tell spouse i have communicated with the doctor in hospital  It is ok to be off esbriet for now until we figure out why he passed out

## 2013-07-18 NOTE — Evaluation (Signed)
Physical Therapy Evaluation Patient Details Name: [name] MRN: [id number] DOB: 09-Feb-1934 Today's Date: 07/18/2013   History of Present Illness  [name] is a 79 yo male admitted with syncope in setting of post-micturition, orthostatic hypotension, s/p AV replacement, h/o paroxsymal atrial fibrillation and pulmonary fibrosis. Blood pressure 90/50 supine --> 63/40 standing on ED evaluation  Clinical Impression   Pt admitted with above. Pt currently with functional limitations due to the deficits listed below (see PT Problem List).  Pt will benefit from skilled PT to increase their independence and safety with mobility to allow discharge to the venue listed below.       Follow Up Recommendations Home health PT;Supervision/Assistance - 24 hour [facility](HHRN for chronic disease management) It is possible and likely that as [name]'s BP and medical  issues stabilize, he may not need HHPT    Equipment Recommendations  None recommended by PT    Recommendations for Other Services       Precautions / Restrictions Precautions Precautions: Fall Precaution Comments: Orthostatic Hypotension on eval      Mobility  Bed Mobility Overal bed mobility: Modified Independent                Transfers Overall transfer level: Needs assistance Equipment used: None Transfers: Sit to/from Stand Sit to Stand: Supervision         General transfer comment: Supervision mostly for safety in setting of recent collapse; Cues to control descent with stand to sit  Ambulation/Gait Ambulation/Gait assistance: Supervision Ambulation Distance (Feet): 20 Feet Assistive device:  (pushing Dinamap) Gait Pattern/deviations: WFL(Within Functional Limits)     General Gait Details: Cues to self-monitor for activity tolerance  Stairs            Wheelchair Mobility    Modified Rankin (Stroke Patients Only)       Balance                                              Pertinent Vitals/Pain See vitals flow sheet.     Home Living Family/patient expects to be discharged to:: Private residence Living Arrangements: Spouse/significant other Available Help at Discharge: Family;Available PRN/intermittently (wife works) Type of Home: House Home Access: Stairs to enter Entrance Stairs-Rails: None Secretary/administratorntrance Stairs-Number of Steps: 1 Home Layout: One level Home Equipment: None      Prior Function Level of Independence: Independent         Comments: Had participated and graduated (whith honors, per pt and wife:) from Pulmonary rehab earlier this year, and was walking on treadmill and indoor track until recently, when he started to feel weak; Pt wonders is these episodes have coincided with medication changes     Hand Dominance        Extremity/Trunk Assessment   Upper Extremity Assessment: Overall WFL for tasks assessed           Lower Extremity Assessment: Generalized weakness         Communication   Communication: No difficulties  Cognition Arousal/Alertness: Awake/alert Behavior During Therapy: WFL for tasks assessed/performed Overall Cognitive Status: Within Functional Limits for tasks assessed                      General Comments      Exercises        Assessment/Plan    PT Assessment Patient needs continued  PT services  PT Diagnosis Difficulty walking   PT Problem List Decreased activity tolerance;Cardiopulmonary status limiting activity  PT Treatment Interventions DME instruction;Gait training;Stair training;Functional mobility training;Therapeutic activities;Therapeutic exercise;Patient/family education   PT Goals (Current goals can be found in the Care Plan section) Acute Rehab PT Goals Patient Stated Goal: Would like to get better and be able to resume Pulmonary Rehab PT Goal Formulation: With patient Time For Goal Achievement: 07/25/13 Potential to Achieve Goals: Good    Frequency Min 3X/week    Barriers to discharge Decreased caregiver support Pt needs to be completely independent to be safe at home; Will likely be fine and safe once BP/medical issues are resolved    Co-evaluation               End of Session Equipment Utilized During Treatment: Gait belt Activity Tolerance: Other (comment) (limited by hypotension in standing) Patient left: in bed;with call bell/phone within reach;with family/visitor present Nurse Communication: Mobility status    Functional Assessment Tool Used: Clinical judgement Functional Limitation: Mobility: Walking and moving around Mobility: Walking and Moving Around Current Status ([id number]): At least 1 percent but less than 20 percent impaired, limited or restricted Mobility: Walking and Moving Around Goal Status [phone]([id number]): 0 percent impaired, limited or restricted    Time: 1052-1127 PT Time Calculation (min): 35 min   Charges:   PT Evaluation $Initial PT Evaluation Tier I: 1 Procedure PT Treatments $Gait Training: 8-22 mins $Therapeutic Activity: 8-22 mins   PT G Codes:   Functional Assessment Tool Used: Clinical judgement Functional Limitation: Mobility: Walking and moving around    Lou­zaGarrigan, DeemstonHolly Hamff 07/18/2013, 1:11 PM  Van ClinesHolly Garrigan, PT  Acute Rehabilitation Services Pager [phone] Office [phone]

## 2013-07-18 NOTE — Progress Notes (Signed)
Md. Mahala MenghiniSamtani just left the patient's from explaining plan of care prior to patient's wife verbalizing her complaint to the charge nurse about not knowing what the plan of care for the patient. After being made aware by charge nurse, I paged Md Samtani to answer any remaining questions for the patient's wife. Once in the from patient's wife wanted to verify that he would call patient's pulmonologist as promised.  MD informed both of them he had reached out to his pulmonologist and is awaiting a returned phone call.

## 2013-07-18 NOTE — Telephone Encounter (Signed)
Spoke with the pt's spouse  She reports that pt was admitted as of 07/17/13  He woke up early that am and had episode of syncope  Was taken to hospital by EMS and is expected to go home tomorrow if BP becomes stable, as it has been low  She wants PW to be aware that the MD taking care of him did not want the pt taking Esbriet, and so he has not taken for the past 2 days  Will forward to PW as FYI

## 2013-07-18 NOTE — Progress Notes (Signed)
Note: This document was prepared with digital dictation and possible smart phrase technology. Any transcriptional errors that result from this process are unintentional.   Guss BundeRobert G Leite [name] DOB: [birth year] DOA: 07/17/2013 PCP: Laren BoomHommel, Kelisha Dall, DO  Brief narrative: 79 y/o ?, known h/o Htn, Boop,  AoS severe s/p AVR 10/15/09, prior Afib on AMioCAD [cath 2011=Non-obst Pulm disease] Pulm IF started on Pirfenidone admitted with syncopal episode 07/17/13.  Has h/o chronic ortho stasis but has never had a syncopal episode like before.  also noted to have coolness to the LUE.  Past medical history-As per Problem list Chart reviewed as below- reviewed  Consultants:  none  Procedures:  none  Antibiotics:  none   Subjective  Doing better tol diet No cop or sob other than usual No fever or chills Wife has ++ questions   Objective    Interim History: none  Telemetry:    Objective: Filed Vitals:   07/18/13 1105 07/18/13 1108 07/18/13 1112 07/18/13 1117  BP: 121/60 112/49 96/40 85/46   Pulse: 78 81 87 90  Temp:      TempSrc:      Resp:      Weight:      SpO2:        Intake/Output Summary (Last 24 hours) at 07/18/13 1343 Last data filed at 07/18/13 0945  Gross per 24 hour  Intake 1247.5 ml  Output    900 ml  Net  347.5 ml    Exam:  General: eomi, ncat Cardiovascular: s1s2 no m/r/g Respiratory: clear, no added sound Abdomen: soft, nt nd  Skin nad Neuro intact.  Grossly intact, moving all 4limbs  Data Reviewed: Basic Metabolic Panel:  Recent Labs Lab 07/17/13 0640 07/17/13 1300 07/18/13 0525  NA 136*  --  136*  K 4.9  --  4.5  CL 98  --  99  CO2 26  --  22  GLUCOSE 109*  --  99  BUN 24*  --  17  CREATININE 1.08 0.95 1.01  CALCIUM 9.0  --  8.4   Liver Function Tests:  Recent Labs Lab 07/17/13 1300  AST 37  ALT 53  ALKPHOS 59  BILITOT 0.4  PROT 5.9*  ALBUMIN 2.7*   No results found for this basename: LIPASE, AMYLASE,  in the  last 168 hours No results found for this basename: AMMONIA,  in the last 168 hours CBC:  Recent Labs Lab 07/17/13 0640 07/17/13 1300 07/18/13 0525  WBC 15.3* 12.6* 10.2  HGB 10.6* 9.3* 9.1*  HCT 31.7* 27.4* 27.4*  MCV 78.5 77.8* 78.3  PLT PLATELET CLUMPS NOTED ON SMEAR, COUNT APPEARS ADEQUATE 173 172   Cardiac Enzymes: No results found for this basename: CKTOTAL, CKMB, CKMBINDEX, TROPONINI,  in the last 168 hours BNP: No components found with this basename: POCBNP,  CBG: No results found for this basename: GLUCAP,  in the last 168 hours  Recent Results (from the past 240 hour(s))  URINE CULTURE     Status: None   Collection Time    07/17/13  9:00 AM      Result Value Ref Range Status   Specimen Description URINE, RANDOM   Final   Special Requests NONE   Final   Culture  Setup Time     Final   Value: 07/17/2013 09:54     [facility]formed at [facility]   Colony Count     Final   Value: NO GROWTH     [facility]formed at [facility]  Culture     Final   Value: NO GROWTH     Performed at [facility]   Report Status 07/18/2013 FINAL   Final     Studies:              All Imaging reviewed and is as per above notation   Scheduled Meds: . amoxicillin-clavulanate  1 tablet Oral BID  . aspirin  81 mg Oral Daily  . [START ON 07/19/2013] feeding supplement (ENSURE COMPLETE)  237 mL Oral Q24H  . [START ON 07/19/2013] feeding supplement (RESOURCE BREEZE)  1 Container Oral Q24H  . heparin  5,000 Units Subcutaneous 3 times per day  . loratadine  10 mg Oral Daily  . pantoprazole  40 mg Oral Daily  . Pirfenidone  3 capsule Oral TID  . predniSONE  20 mg Oral Q breakfast  . simvastatin  20 mg Oral q1800  . sodium chloride  3 mL Intravenous Q12H   Continuous Infusions: . sodium chloride 150 mL/hr at 07/18/13 0104     Assessment/Plan: 1. Likely orthostatic hypotension-D/c BB. Esbriet can cause dizziness in 30% cases.  Would avoid and will forward to pulm MD for  further ideas re whether we should continue this despite risk of dizzyness/fall etc..  Hold IVF today.  Ambulate.  I have mentioned to wife he would be safest with 24/7 care 2. Severe AS s/p AVR 2011-monitor.  Echo 05/2013 didn't show any issues with valve.   3. Cad-cath non-obstructive recently.  Monitor.  BB d/c given orthostasis-could try Coreg lower dose on d/c.  Cont asa 81 daily 4. Boop/[place] lung disease continue prednisone 20 mg daily.  See above discussion.  Continue Augmentin 1 tab bid for 5-7 days 5. Genella RifeGerd.  Continue Prtonix 40 daily 6. Cool LUE-will get arterial doppler r/o clot.  Unlikely as hand is very warm now  Code Status: full Family Communication:  Discussed with wife at bedside and clarified her concerns Disposition Plan:  obs x 1 more day   Pleas KochJai Samtani, MD  [facility] Pager [phone] 07/18/2013, 1:43 PM    LOS: 1 day

## 2013-07-18 NOTE — Progress Notes (Signed)
INITIAL NUTRITION ASSESSMENT  DOCUMENTATION CODES Per approved criteria  -Severe malnutrition in the context of chronic illness   INTERVENTION: Recommend diet advancement as tolerated to Heart Healthy diet - per MD discretion. RD provided samples of oral nutrition supplements to help with limited po intake. Add Resource Breeze po daily and Ensure Complete po daily. RD to continue to follow nutrition care plan.  NUTRITION DIAGNOSIS: Unintentional wt loss related to limited appetite as evidenced by 10% wt loss x 3 months.   Goal: Intake to meet >90% of estimated nutrition needs.  Monitor:  weight trends, lab trends, I/O's, PO intake, supplement tolerance  Reason for Assessment: MD Consult for Nutrition Assessment  79 y.o. male  Admitting Dx: Syncope and collapse  ASSESSMENT: PMHx significant for HTN, HLD, aortic stenosis, cleft palate, interstitial lung disease, pulmonary fibrosis. Admitted s/p fall after syncopal episode.   Spouse also states pt has been weak, fatigued, has had a loss of appetite, and a a known 12 lb weight loss in 2-3 weeks and approx 25 lb weight loss over a 2-3 month period. Per MD note, patient's new medication for pulmonary fibrosis, Pirfenidone, often results in side effects including decreased appetite and weight loss.  Pt with 10% wt loss x 3 months - this is significant for time frame. Wife reports that decreased intake coincides with start of medication. Pt reports that on his worst days he would just have a few bites of fruits and no protein-rich foods.   Pt reports that his stomach is upset currently. He thinks it's because of his Full Liquid diet order.  Pt without significant fat/muscle wasting.  Pt meets criteria for severe MALNUTRITION in the context of chronic illness as evidenced by 10% wt loss x 3 months and intake of <75% x at least 1 month.  Sodium low at 136 Potassium WNL  Height: Ht Readings from Last 1 Encounters:  07/10/13 6\' 1"   (1.854 m)    Weight: Wt Readings from Last 1 Encounters:  07/17/13 226 lb 6.4 oz (102.694 kg)    Ideal Body Weight: 184 lb  % Ideal Body Weight: 123%  Wt Readings from Last 25 Encounters:  07/17/13 226 lb 6.4 oz (102.694 kg)  07/10/13 226 lb 6.4 oz (102.694 kg)  07/08/13 235 lb (106.595 kg)  06/29/13 238 lb (107.956 kg)  05/28/13 242 lb (109.77 kg)  04/23/13 251 lb (113.853 kg)  04/18/13 247 lb (112.038 kg)  03/27/13 247 lb (112.038 kg)  03/19/13 246 lb (111.585 kg)  02/21/13 249 lb 6.4 oz (113.127 kg)  11/10/12 252 lb 12.8 oz (114.669 kg)  08/25/12 252 lb 6.4 oz (114.488 kg)  08/07/12 256 lb (116.121 kg)  08/03/12 245 lb (111.131 kg)  07/31/12 247 lb 12.8 oz (112.401 kg)  07/20/12 246 lb (111.585 kg)  06/15/12 251 lb (113.853 kg)  06/09/12 248 lb (112.492 kg)  06/07/12 248 lb (112.492 kg)  06/01/12 253 lb (114.76 kg)  05/29/12 259 lb (117.482 kg)  12/14/11 269 lb (122.018 kg)  04/20/11 266 lb (120.657 kg)  01/25/11 260 lb (117.935 kg)  11/14/10 260 lb 8 oz (118.162 kg)    Usual Body Weight: 250 lb  % Usual Body Weight: 90%  BMI:  Body mass index is 29.88 kg/(m^2). Overweight  Estimated Nutritional Needs: Kcal: 1700 - 1900 Protein: 80 - 95 g Fluid: at least 2 liters daily  Skin: intact  Diet Order: Full Liquid  EDUCATION NEEDS: -No education needs identified at this time   Intake/Output Summary (  Last 24 hours) at 07/18/13 0957 Last data filed at 07/18/13 [id number]  Gross per 24 hour  Intake 1007.5 ml  Output    900 ml  Net  107.5 ml    Last BM: 6/22  Labs:   Recent Labs Lab 07/17/13 0640 07/17/13 1300 07/18/13 0525  NA 136*  --  136*  K 4.9  --  4.5  CL 98  --  99  CO2 26  --  22  BUN 24*  --  17  CREATININE 1.08 0.95 1.01  CALCIUM 9.0  --  8.4  GLUCOSE 109*  --  99    CBG (last 3)  No results found for this basename: GLUCAP,  in the last 72 hours  Scheduled Meds: . amoxicillin-clavulanate  1 tablet Oral BID  . aspirin  81 mg Oral  Daily  . heparin  5,000 Units Subcutaneous 3 times per day  . loratadine  10 mg Oral Daily  . pantoprazole  40 mg Oral Daily  . Pirfenidone  3 capsule Oral TID  . predniSONE  20 mg Oral Q breakfast  . simvastatin  20 mg Oral q1800  . sodium chloride  3 mL Intravenous Q12H    Continuous Infusions: . sodium chloride 150 mL/hr at 07/18/13 0104    Past Medical History  Diagnosis Date  . Hypertension   . Hyperlipidemia   . Aortic stenosis     AVR 2011  . Dermatitis     poison ivy  . Cleft palate   . Interstitial lung disease   . Pulmonary fibrosis   . Syncope and collapse 07/18/2014  . GERD (gastroesophageal reflux disease)     Past Surgical History  Procedure Laterality Date  . Bilat cataract sx  2004  . Cholecystectomy  1999  . 7 surgeries for cleft palate  1938  . Oral surgery with bone graft from hip for cleft palate  1989  . Right groin hernia repair  1950's  . Rotator cuff tear, left shoulder    . Aortic valve replacement w/ pericardial tissue valve  10-15-09    Dr Barry Dieneswens    Jarold MottoSamantha Worley MS, RD, LDN Inpatient Registered Dietitian Pager: [phone] After-hours pager: [phone]

## 2013-07-18 NOTE — Progress Notes (Signed)
Utilization review completed.  

## 2013-07-18 NOTE — H&P (Addendum)
INTERNAL MEDICINE TEACHING ATTENDING NOTE  Day 1 of stay  Patient name: [name]  MRN: [id number] Date of birth: Jan 30, 1934   79 y.o. male with past medical history of hypertension, hyperlipidemia, aortic stenosis, interstitial fibrosis started on a new medicine pirfenidone, feeling weak for 2-3 months, was having a bout of upper respiratory illness currently, felt dizzy and lightheaded yesterday - had a syncope and was admitted for work up. I met the patient this morning, who reported feeling much better and was not dizzy any more.  Filed Vitals:   07/18/13 1105 07/18/13 1108 07/18/13 1112 07/18/13 1117  BP: 121/60 112/49 96/40 85/46   Pulse: 78 81 87 90  Temp:      TempSrc:      Resp:      Weight:      SpO2:        Physical Exam   General: Resting in bed. No cute distress.  HEENT: PERRL, EOMI, no scleral icterus. Poor dentition. Heart:  RRR, no rubs, systolic murmur +, no gallops. Lungs: Clear to auscultation bilaterally, no wheezes, positive rales, or rhonchi. Abdomen: Soft, nontender, nondistended, BS present. Extremities: Cold left hand fingers, non tender (per patient report they were purple last night - however normal color now). Radial pulses bounding bilaterally. Other extremities warm to touch and peripheral pulses adequate. No pedal edema.   Neuro: Alert and oriented X3, cranial nerves II-XII grossly intact,  strength and sensation to light touch equal in bilateral upper and lower extremities  I have reviewed imaging and labs.    Assessment and Plan    Syncope, multifactorial - hypotension, vasovagal, hypovolemia, on beta blockers - Orthostatic vitals are positive. Feels better with rehydration. Agree with discontinuing BB. AMI ruled out, Echo relatively normal.   Medication adverse effects - The medication pirfenidone that the patient is using has significant side effects like fatigue, dizziness, sik rash, nausea, and upper respiratory tract infection,  decreased appetitie - consistent with symptoms that he is experiencing. I would suggest pulmonology follow up to reassess the risk-benefit for this patient and discuss quality of life.   Cool left extremity and reported cyanosis ? - Dr Heber Knobel reports that he discussed this finding with Dr Allyson Sabal who will resume care for [facility] for this patient. The patient has good radial pulses now. This could be secondary to low blood presure and poor perfusion, however it worries me that this is unilateral. An arterial doppler for this extremity could be a possible test to begin with, in my opinion.     I have seen and evaluated this patient and discussed it with my IM resident team.  Please see the rest of the plan per resident note from today.   [place], [place] 07/18/2013, 12:09 PM.

## 2013-07-18 NOTE — Telephone Encounter (Signed)
Spoke with pt wife  Aware to stop Esbriet until further notice. Expressed understanding.  Nothing further needed.

## 2013-07-18 NOTE — Progress Notes (Signed)
Physical Therapy Note  PT eval complete with full note to follow,   Orthostatic vitals:  07/18/13 1105 07/18/13 1108 07/18/13 1112  Vital Signs  Pulse Rate 78 81 87  Pulse Rate Source Dinamap Dinamap Dinamap  BP 121/60 mmHg ! 112/49 mmHg ! 96/40 mmHg  BP Location Left arm Left arm Left arm  BP Method Automatic Automatic Automatic  Patient Position (if appropriate) Lying Sitting Standing     07/18/13 1117  Vital Signs  Pulse Rate 90  Pulse Rate Source Dinamap  BP ! 85/46 mmHg  BP Location Left arm  BP Method Automatic  Patient Position (if appropriate) Standing (post in room amb)    Please see other note of this date for full details;  Thanks,   Van ClinesHolly Garrigan, [place]PT  Acute Rehabilitation Services Pager [phone] Office [phone]

## 2013-07-19 DIAGNOSIS — R0989 Other specified symptoms and signs involving the circulatory and respiratory systems: Secondary | ICD-10-CM

## 2013-07-19 LAB — CBC WITH DIFFERENTIAL/PLATELET
BASOS ABS: 0 10*3/uL (ref 0.0–0.1)
Basophils Relative: 0 % (ref 0–1)
Eosinophils Absolute: 0.1 10*3/uL (ref 0.0–0.7)
Eosinophils Relative: 0 % (ref 0–5)
HEMATOCRIT: 27.8 % — AB (ref 39.0–52.0)
Hemoglobin: 9.1 g/dL — ABNORMAL LOW (ref 13.0–17.0)
LYMPHS PCT: 11 % — AB (ref 12–46)
Lymphs Abs: 1.4 10*3/uL (ref 0.7–4.0)
MCH: 26.1 pg (ref 26.0–34.0)
MCHC: 32.7 g/dL (ref 30.0–36.0)
MCV: 79.7 fL (ref 78.0–100.0)
MONO ABS: 1.1 10*3/uL — AB (ref 0.1–1.0)
Monocytes Relative: 9 % (ref 3–12)
Neutro Abs: 9.8 10*3/uL — ABNORMAL HIGH (ref 1.7–7.7)
Neutrophils Relative %: 80 % — ABNORMAL HIGH (ref 43–77)
PLATELETS: 149 10*3/uL — AB (ref 150–400)
RBC: 3.49 MIL/uL — ABNORMAL LOW (ref 4.22–5.81)
RDW: 17 % — ABNORMAL HIGH (ref 11.5–15.5)
WBC: 12.3 10*3/uL — ABNORMAL HIGH (ref 4.0–10.5)

## 2013-07-19 NOTE — Progress Notes (Signed)
Patient discharge teaching given, including activity, diet, follow-up appoints, and medications. Patient verbalized understanding of all discharge instructions. IV access was d/c'd. Vitals are stable. Skin is intact except as charted in most recent assessments. Pt to be escorted out by NT, to be driven home by family.  Andrew Brake, MBA, BS, RN 

## 2013-07-19 NOTE — Progress Notes (Signed)
Pt seen for full OT evaluation.  See full note for details. Orthostatics are as follows:  Sitting:  102/45 Standing:  85/45 Return to sit:  112/52  Tory EmeraldHolly Jones, OTR/L [phone]

## 2013-07-19 NOTE — Evaluation (Signed)
Occupational Therapy Evaluation and Discharge Summary Patient Details Name: [name] MRN: [id number] DOB: July 06, 1934 Today's Date: 07/19/2013    History of Present Illness [name] is a 79 yo male admitted with syncope in setting of post-micturition, orthostatic hypotension, s/p AV replacement, h/o paroxsymal atrial fibrillation and pulmonary fibrosis. Blood pressure 90/50 supine --> 63/40 standing on ED evaluation   Clinical Impression   Pt admitted for the above diagnosis and has deficits listed below that are related to orthostatic hypotension.  Pt is able to complete all adls w/o assist when his BP is stable.  When his BP drops, pt becomes dizzy and a fall risk.  Physical Therapy is following pt at this time for these needs.  OT to sign off at this time.    Follow Up Recommendations  No OT follow up    Equipment Recommendations  None recommended by OT    Recommendations for Other Services       Precautions / Restrictions Precautions Precautions: Fall Precaution Comments: Orthostatic Hypotension on eval Restrictions Weight Bearing Restrictions: No      Mobility Bed Mobility Overal bed mobility: Modified Independent                Transfers Overall transfer level: Needs assistance Equipment used: None Transfers: Sit to/from Stand Sit to Stand: Supervision         General transfer comment: Supervision mostly for safety in setting of recent collapse; Cues to control descent with stand to sit    Balance Overall balance assessment: Needs assistance         Standing balance support: Bilateral upper extremity supported;During functional activity Standing balance-Leahy Scale: Fair Standing balance comment: only reason pt needs assist in standing is due to dizziness from orthostatic hypotension.                            ADL Overall ADL's : Needs assistance/impaired Eating/Feeding: Independent;Sitting   Grooming:  Supervision/safety;Standing   Upper Body Bathing: Independent;Sitting   Lower Body Bathing: Supervison/ safety;Sit to/from stand   Upper Body Dressing : Independent;Sitting   Lower Body Dressing: Supervision/safety;Sit to/from stand   Toilet Transfer: Supervision/safety;Comfort height toilet Toilet Transfer Details (indicate cue type and reason): pt walks with walker with S but is limited on distance b/c of dizziness due to dropping blood pressure. Toileting- Clothing Manipulation and Hygiene: Supervision/safety;Sit to/from stand   Tub/ Shower Transfer: Supervision/safety;Ambulation   Functional mobility during ADLs: Supervision/safety;Rolling walker General ADL Comments: Pt is able to do all ads w/o assist.  the only thing that is limiting this pt is his changes in BP.       Vision                     Perception     Praxis      Pertinent Vitals/Pain Orthostatics:  Sitting 120/45, 85/45 in standing, 112/54 when returned to sitting.     Hand Dominance Right   Extremity/Trunk Assessment Upper Extremity Assessment Upper Extremity Assessment: Overall WFL for tasks assessed   Lower Extremity Assessment Lower Extremity Assessment: Defer to PT evaluation   Cervical / Trunk Assessment Cervical / Trunk Assessment: Normal   Communication Communication Communication: No difficulties   Cognition Arousal/Alertness: Awake/alert Behavior During Therapy: WFL for tasks assessed/performed Overall Cognitive Status: Within Functional Limits for tasks assessed  General Comments       Exercises       Shoulder Instructions      Home Living Family/patient expects to be discharged to:: Private residence Living Arrangements: Spouse/significant other Available Help at Discharge: Family;Available PRN/intermittently Type of Home: House Home Access: Stairs to enter Entergy CorporationEntrance Stairs-Number of Steps: 1 Entrance Stairs-Rails: None Home Layout: One  level     Bathroom Shower/Tub: Tub/shower unit;Curtain Shower/tub characteristics: Engineer, building servicesCurtain Bathroom Toilet: Standard     Home Equipment: None;Shower seat - built in          Prior Functioning/Environment Level of Independence: Independent        Comments: Had participated and graduated (whith honors, per pt and wife:) from Pulmonary rehab earlier this year, and was walking on treadmill and indoor track until recently, when he started to feel weak; Pt wonders is these episodes have coincided with medication changes    OT Diagnosis:     OT Problem List:     OT Treatment/Interventions:      OT Goals(Current goals can be found in the care plan section) Acute Rehab OT Goals Patient Stated Goal: Would like to get better and be able to resume Pulmonary Rehab  OT Frequency:     Barriers to D/C:            Co-evaluation              End of Session Equipment Utilized During Treatment: Oxygen Nurse Communication: Mobility status;Other (comment) (orthostatics)  Activity Tolerance: Other (comment) (limited due to BP) Patient left: in chair;with call bell/phone within reach;with family/visitor present   Time: 1030-1050 OT Time Calculation (min): 20 min Charges:  OT General Charges $OT Visit: 1 Procedure OT Evaluation $Initial OT Evaluation Tier I: 1 Procedure OT Treatments $Self Care/Home Management : 8-22 mins G-Codes: OT G-codes **NOT FOR INPATIENT CLASS** Functional Assessment Tool Used: clinical judgement Functional Limitation: Self care Self Care Current Status ([id number](G8987): At least 1 percent but less than 20 percent impaired, limited or restricted Self Care Goal Status ([id number](G8988): At least 1 percent but less than 20 percent impaired, limited or restricted Self Care Discharge Status [phone](G8989): At least 1 percent but less than 20 percent impaired, limited or restricted  [name], [name] 07/19/2013, 11:03 AM [phone]

## 2013-07-19 NOTE — Progress Notes (Signed)
VASCULAR LAB PRELIMINARY  PRELIMINARY  PRELIMINARY  PRELIMINARY  Left upper extremity arterial Duplex completed.    Preliminary report:  Left upper extremity arterial duplex reveals no evidence of stenosis and triphasic waveforms in the subclavian, axillary, brachial, ulnar, radial arteries as well as normal flow noted in the left palmar arch.  KANADY, CANDACE, RVT 07/19/2013, 4:50 PM

## 2013-07-23 ENCOUNTER — Encounter: Payer: Self-pay | Admitting: Family Medicine

## 2013-07-23 ENCOUNTER — Ambulatory Visit (INDEPENDENT_AMBULATORY_CARE_PROVIDER_SITE_OTHER): Payer: Medicare Other | Admitting: Family Medicine

## 2013-07-23 VITALS — BP 85/49 | HR 90 | Temp 97.5°F | Wt 225.0 lb

## 2013-07-23 DIAGNOSIS — E46 Unspecified protein-calorie malnutrition: Secondary | ICD-10-CM | POA: Diagnosis not present

## 2013-07-23 DIAGNOSIS — D509 Iron deficiency anemia, unspecified: Secondary | ICD-10-CM

## 2013-07-23 DIAGNOSIS — I959 Hypotension, unspecified: Secondary | ICD-10-CM

## 2013-07-23 MED ORDER — FERROUS SULFATE 325 (65 FE) MG PO TBEC: 325.0000 mg | DELAYED_RELEASE_TABLET | Freq: Three times a day (TID) | ORAL | Status: DC

## 2013-07-23 MED ORDER — MIDODRINE HCL 2.5 MG PO TABS: 2.5000 mg | ORAL_TABLET | Freq: Three times a day (TID) | ORAL | Status: DC

## 2013-07-23 NOTE — Progress Notes (Signed)
CC: Roy BundeRobert G Chan is a 79 y.o. male is here for hospital f/u   Subjective: HPI:  Hospital followup for syncope that occurred last week. He describes that for the morning he just finished urinating and was beginning to wash his hands and had a lightheadedness or he remembers losing his balance and then coming to minutes later while on the floor being shaken by his wife.  He was hospitalized, troponin was negative, orthostatics were positive, EKG was reportedly unremarkable. He was placed on telemetry and this was reportedly unremarkable as well. He tells me that one to 2 months prior to the episode above he had been losing his appetite and was eating no more than a meal the size of his palm 3 times a day. The symptoms started soon after beginning anti-inflammatory medication for his lungs.  Since that time he states that he's gotten only a small portion of his energy back however now has full return of his appetite since stopping the above medication. He reports he gets lightheaded when standing but denies any lightheadedness or dizziness when sitting.  He has stopped taking metoprolol and is currently not taking any blood pressure medication. Blood pressures at home have been ranging systolic 75/100, diastolic 50-60.  His wife has been feeding him saltine crackers, canned soups, dill pickles in between his regular 3 meals a day but they do not seem to be recognizing any increase in blood pressure with these interventions.  Recently he denies any fevers, chills, memory loss, headache, wheezing, cough, shortness of breath, chest pain, focal weakness, abdominal pain, nausea, vomiting, diarrhea or constipation.  Review Of Systems Outlined In HPI  Past Medical History  Diagnosis Date  . Hypertension   . Hyperlipidemia   . Aortic stenosis     AVR 2011  . Dermatitis     poison ivy  . Cleft palate   . Interstitial lung disease   . Pulmonary fibrosis   . Syncope and collapse 07/18/2014  . GERD  (gastroesophageal reflux disease)     Past Surgical History  Procedure Laterality Date  . Bilat cataract sx  2004  . Cholecystectomy  1999  . 7 surgeries for cleft palate  1938  . Oral surgery with bone graft from hip for cleft palate  1989  . Right groin hernia repair  1950's  . Rotator cuff tear, left shoulder    . Aortic valve replacement w/ pericardial tissue valve  10-15-09    Dr Barry Dieneswens   Family History  Problem Relation Age of Onset  . Heart attack Mother   . Cancer Father     lung and stomach  . Heart attack Father   . Cancer Sister     bone  . Diabetes Sister   . Heart murmur Brother   . Hypertension Brother   . Hyperlipidemia Brother   . Heart attack Brother     History   Social History  . Marital Status: Married    Spouse Name: N/A    Number of Children: N/A  . Years of Education: N/A   Occupational History  . Retired    Social History Main Topics  . Smoking status: Former Smoker -- 1.50 packs/day for 5 years    Types: Cigarettes, Cigars    Quit date: 09/08/1977  . Smokeless tobacco: Never Used  . Alcohol Use: No  . Drug Use: No  . Sexual Activity: Not on file   Other Topics Concern  . Not on file   Social  History Narrative  . No narrative on file     Objective: BP 85/49  Pulse 90  Temp(Src) 97.5 F (36.4 C) (Oral)  Wt 225 lb (102.059 kg)  General: Alert and Oriented, No Acute Distress HEENT: Pupils equal, round, reactive to light. Conjunctivae clear.  External ears unremarkable, canals clear with intact TMs with appropriate landmarks.  Middle ear appears open without effusion. Pink inferior turbinates.  Moist mucous membranes, pharynx without inflammation nor lesions.  Neck supple without palpable lymphadenopathy nor abnormal masses. Lungs: Comfortable work of breathing with good air movement, trace inspiratory rales in all lung fields but no wheezing or rhonchi nor signs of consolidation Cardiac: Regular rate and rhythm. Normal S1/S2.  No  murmurs, rubs, nor gallops.   Abdomen: Soft nontender Extremities: No peripheral edema.  Strong peripheral pulses.  Mental Status: No depression, anxiety, nor agitation. Skin: Warm and dry.  Assessment & Plan: Roy [name] was seen today for hospital f/u.  Diagnoses and associated orders for this visit:  Anemia, iron deficiency - ferrous sulfate 325 (65 FE) MG EC tablet; Take 1 tablet (325 mg total) by mouth 3 (three) times daily with meals.  Hypotension, unspecified hypotension type - midodrine (PROAMATINE) 2.5 MG tablet; Take 1 tablet (2.5 mg total) by mouth 3 (three) times daily with meals.  Malnourished    Anemia: Suspected this is partially contributing to his fatigue, since his MCV is low I encouraged him to start iron supplement 2-3 times a day with meals. Malnourishment: Albumin and total protein were low on recent blood from the hospital, suspicious that his new pulmonary anti-inflammatory medication was causing appetite suppression to a degree where he became malnourished. Fortunately appetite is back since stopping the medication. In addition to meals and snacks he started being he will add boost protein shake his diet daily. Hypotension: Anticipate this to improve when his anemia and circulating protein improves, he is an extremely high fall risk and has been on long-term prednisone therefore I like him to start a very low dose of midodrine.  I've asked his wife to take his blood pressure daily and if ever at or above 160/100 discontinue midrodrine.  I've asked the family to provide me with daily blood pressures when we get to Thursday or Friday.  40 minutes spent face-to-face during visit today of which at least 50% was counseling or coordinating care regarding: 1. Anemia, iron deficiency   2. Hypotension, unspecified hypotension type   3. Malnourished      Return in about 4 weeks (around 08/20/2013) for Blood Pressure.

## 2013-07-28 ENCOUNTER — Inpatient Hospital Stay (HOSPITAL_COMMUNITY): Payer: Medicare Other

## 2013-07-28 ENCOUNTER — Encounter (HOSPITAL_BASED_OUTPATIENT_CLINIC_OR_DEPARTMENT_OTHER): Payer: Self-pay | Admitting: Emergency Medicine

## 2013-07-28 ENCOUNTER — Inpatient Hospital Stay (HOSPITAL_BASED_OUTPATIENT_CLINIC_OR_DEPARTMENT_OTHER)
Admission: EM | Admit: 2013-07-28 | Discharge: 2013-08-01 | DRG: 872 | Disposition: A | Discharge status: Home / Self Care | Payer: Medicare Other | Attending: Internal Medicine | Admitting: Internal Medicine

## 2013-07-28 DIAGNOSIS — N3090 Cystitis, unspecified without hematuria: Secondary | ICD-10-CM | POA: Diagnosis not present

## 2013-07-28 DIAGNOSIS — N390 Urinary tract infection, site not specified: Secondary | ICD-10-CM | POA: Diagnosis not present

## 2013-07-28 DIAGNOSIS — IMO0002 Reserved for concepts with insufficient information to code with codable children: Secondary | ICD-10-CM

## 2013-07-28 DIAGNOSIS — I959 Hypotension, unspecified: Secondary | ICD-10-CM | POA: Diagnosis not present

## 2013-07-28 DIAGNOSIS — R319 Hematuria, unspecified: Secondary | ICD-10-CM | POA: Diagnosis not present

## 2013-07-28 DIAGNOSIS — D6489 Other specified anemias: Secondary | ICD-10-CM | POA: Diagnosis present

## 2013-07-28 DIAGNOSIS — I359 Nonrheumatic aortic valve disorder, unspecified: Secondary | ICD-10-CM

## 2013-07-28 DIAGNOSIS — Z87891 Personal history of nicotine dependence: Secondary | ICD-10-CM | POA: Diagnosis not present

## 2013-07-28 DIAGNOSIS — Z9981 Dependence on supplemental oxygen: Secondary | ICD-10-CM

## 2013-07-28 DIAGNOSIS — N529 Male erectile dysfunction, unspecified: Secondary | ICD-10-CM

## 2013-07-28 DIAGNOSIS — D649 Anemia, unspecified: Secondary | ICD-10-CM | POA: Diagnosis present

## 2013-07-28 DIAGNOSIS — I10 Essential (primary) hypertension: Secondary | ICD-10-CM | POA: Diagnosis present

## 2013-07-28 DIAGNOSIS — H9190 Unspecified hearing loss, unspecified ear: Secondary | ICD-10-CM | POA: Diagnosis present

## 2013-07-28 DIAGNOSIS — K219 Gastro-esophageal reflux disease without esophagitis: Secondary | ICD-10-CM | POA: Diagnosis present

## 2013-07-28 DIAGNOSIS — N179 Acute kidney failure, unspecified: Secondary | ICD-10-CM | POA: Diagnosis not present

## 2013-07-28 DIAGNOSIS — I480 Paroxysmal atrial fibrillation: Secondary | ICD-10-CM

## 2013-07-28 DIAGNOSIS — IMO0001 Reserved for inherently not codable concepts without codable children: Secondary | ICD-10-CM

## 2013-07-28 DIAGNOSIS — I4819 Other persistent atrial fibrillation: Secondary | ICD-10-CM | POA: Diagnosis present

## 2013-07-28 DIAGNOSIS — J84112 Idiopathic pulmonary fibrosis: Secondary | ICD-10-CM | POA: Diagnosis present

## 2013-07-28 DIAGNOSIS — E8779 Other fluid overload: Secondary | ICD-10-CM | POA: Diagnosis not present

## 2013-07-28 DIAGNOSIS — I714 Abdominal aortic aneurysm, without rupture, unspecified: Secondary | ICD-10-CM | POA: Diagnosis present

## 2013-07-28 DIAGNOSIS — I4891 Unspecified atrial fibrillation: Secondary | ICD-10-CM | POA: Diagnosis not present

## 2013-07-28 DIAGNOSIS — I951 Orthostatic hypotension: Secondary | ICD-10-CM | POA: Diagnosis present

## 2013-07-28 DIAGNOSIS — D508 Other iron deficiency anemias: Secondary | ICD-10-CM

## 2013-07-28 DIAGNOSIS — J8489 Other specified interstitial pulmonary diseases: Secondary | ICD-10-CM

## 2013-07-28 DIAGNOSIS — Z7982 Long term (current) use of aspirin: Secondary | ICD-10-CM | POA: Diagnosis not present

## 2013-07-28 DIAGNOSIS — N12 Tubulo-interstitial nephritis, not specified as acute or chronic: Secondary | ICD-10-CM | POA: Diagnosis not present

## 2013-07-28 DIAGNOSIS — E785 Hyperlipidemia, unspecified: Secondary | ICD-10-CM | POA: Diagnosis present

## 2013-07-28 DIAGNOSIS — J8410 Pulmonary fibrosis, unspecified: Secondary | ICD-10-CM | POA: Diagnosis present

## 2013-07-28 DIAGNOSIS — R350 Frequency of micturition: Secondary | ICD-10-CM | POA: Diagnosis not present

## 2013-07-28 DIAGNOSIS — N3000 Acute cystitis without hematuria: Secondary | ICD-10-CM

## 2013-07-28 DIAGNOSIS — R7309 Other abnormal glucose: Secondary | ICD-10-CM

## 2013-07-28 DIAGNOSIS — H9193 Unspecified hearing loss, bilateral: Secondary | ICD-10-CM

## 2013-07-28 DIAGNOSIS — Z952 Presence of prosthetic heart valve: Secondary | ICD-10-CM

## 2013-07-28 DIAGNOSIS — R030 Elevated blood-pressure reading, without diagnosis of hypertension: Secondary | ICD-10-CM | POA: Diagnosis not present

## 2013-07-28 DIAGNOSIS — R55 Syncope and collapse: Secondary | ICD-10-CM

## 2013-07-28 DIAGNOSIS — A419 Sepsis, unspecified organism: Principal | ICD-10-CM | POA: Diagnosis present

## 2013-07-28 DIAGNOSIS — Z954 Presence of other heart-valve replacement: Secondary | ICD-10-CM

## 2013-07-28 LAB — LACTIC ACID, PLASMA: LACTIC ACID, VENOUS: 2 mmol/L (ref 0.5–2.2)

## 2013-07-28 LAB — COMPREHENSIVE METABOLIC PANEL
ALBUMIN: 3.1 g/dL — AB (ref 3.5–5.2)
ALK PHOS: 54 U/L (ref 39–117)
ALT: 23 U/L (ref 0–53)
ANION GAP: 12 (ref 5–15)
AST: 28 U/L (ref 0–37)
BUN: 16 mg/dL (ref 6–23)
CALCIUM: 9.1 mg/dL (ref 8.4–10.5)
CO2: 25 mEq/L (ref 19–32)
Chloride: 98 mEq/L (ref 96–112)
Creatinine, Ser: 1 mg/dL (ref 0.50–1.35)
GFR calc non Af Amer: 71 mL/min — ABNORMAL LOW (ref >90)
GFR, EST AFRICAN AMERICAN: 82 mL/min — AB (ref >90)
Glucose, Bld: 109 mg/dL — ABNORMAL HIGH (ref 70–99)
Potassium: 4.9 mEq/L (ref 3.7–5.3)
Sodium: 135 mEq/L — ABNORMAL LOW (ref 137–147)
TOTAL PROTEIN: 6.2 g/dL (ref 6.0–8.3)
Total Bilirubin: 0.8 mg/dL (ref 0.3–1.2)

## 2013-07-28 LAB — CBC
HEMATOCRIT: 25.7 % — AB (ref 39.0–52.0)
Hemoglobin: 8.6 g/dL — ABNORMAL LOW (ref 13.0–17.0)
MCH: 26.5 pg (ref 26.0–34.0)
MCHC: 33.5 g/dL (ref 30.0–36.0)
MCV: 79.3 fL (ref 78.0–100.0)
Platelets: 140 10*3/uL — ABNORMAL LOW (ref 150–400)
RBC: 3.24 MIL/uL — ABNORMAL LOW (ref 4.22–5.81)
RDW: 17.7 % — AB (ref 11.5–15.5)
WBC: 13.5 10*3/uL — ABNORMAL HIGH (ref 4.0–10.5)

## 2013-07-28 LAB — CBC WITH DIFFERENTIAL/PLATELET
BAND NEUTROPHILS: 1 % (ref 0–10)
Basophils Absolute: 0 10*3/uL (ref 0.0–0.1)
Basophils Relative: 0 % (ref 0–1)
Eosinophils Absolute: 0 10*3/uL (ref 0.0–0.7)
Eosinophils Relative: 0 % (ref 0–5)
HEMATOCRIT: 27 % — AB (ref 39.0–52.0)
HEMOGLOBIN: 9.1 g/dL — AB (ref 13.0–17.0)
Lymphocytes Relative: 7 % — ABNORMAL LOW (ref 12–46)
Lymphs Abs: 0.9 10*3/uL (ref 0.7–4.0)
MCH: 27.2 pg (ref 26.0–34.0)
MCHC: 33.7 g/dL (ref 30.0–36.0)
MCV: 80.8 fL (ref 78.0–100.0)
MONO ABS: 0.9 10*3/uL (ref 0.1–1.0)
Monocytes Relative: 7 % (ref 3–12)
Neutro Abs: 11.4 10*3/uL — ABNORMAL HIGH (ref 1.7–7.7)
Neutrophils Relative %: 85 % — ABNORMAL HIGH (ref 43–77)
Platelets: 151 10*3/uL (ref 150–400)
RBC: 3.34 MIL/uL — AB (ref 4.22–5.81)
RDW: 17.8 % — ABNORMAL HIGH (ref 11.5–15.5)
WBC: 13.2 10*3/uL — ABNORMAL HIGH (ref 4.0–10.5)

## 2013-07-28 LAB — URINALYSIS, ROUTINE W REFLEX MICROSCOPIC
Bilirubin Urine: NEGATIVE
Glucose, UA: NEGATIVE mg/dL
Ketones, ur: NEGATIVE mg/dL
NITRITE: POSITIVE — AB
PH: 6.5 (ref 5.0–8.0)
Protein, ur: >300 mg/dL — AB
SPECIFIC GRAVITY, URINE: 1.015 (ref 1.005–1.030)
UROBILINOGEN UA: 1 mg/dL (ref 0.0–1.0)

## 2013-07-28 LAB — I-STAT CG4 LACTIC ACID, ED: LACTIC ACID, VENOUS: 1.6 mmol/L (ref 0.5–2.2)

## 2013-07-28 LAB — PROTIME-INR
INR: 1.18 (ref 0.00–1.49)
PROTHROMBIN TIME: 15 s (ref 11.6–15.2)

## 2013-07-28 LAB — MRSA PCR SCREENING: MRSA BY PCR: NEGATIVE

## 2013-07-28 LAB — URINE MICROSCOPIC-ADD ON

## 2013-07-28 LAB — PROCALCITONIN: PROCALCITONIN: 0.3 ng/mL

## 2013-07-28 LAB — CREATININE, SERUM
Creatinine, Ser: 1.05 mg/dL (ref 0.50–1.35)
GFR calc Af Amer: 78 mL/min — ABNORMAL LOW (ref >90)
GFR calc non Af Amer: 67 mL/min — ABNORMAL LOW (ref >90)

## 2013-07-28 MED ORDER — DEXTROSE 5 % IV SOLN: 1.0000 g | Freq: Once | INTRAVENOUS | Status: AC

## 2013-07-28 MED ORDER — SIMVASTATIN 10 MG PO TABS
10.0000 mg | ORAL_TABLET | Freq: Every day | ORAL | Status: DC
  Administered 2013-07-28 – 2013-07-31 (×4): 10 mg via ORAL
  Filled 2013-07-28 (×6): qty 1

## 2013-07-28 MED ORDER — CEFTRIAXONE SODIUM 1 G IJ SOLR
INTRAMUSCULAR | Status: AC
  Administered 2013-07-28: 1000 mg
  Filled 2013-07-28: qty 10

## 2013-07-28 MED ORDER — METOPROLOL TARTRATE 1 MG/ML IV SOLN: 5.0000 mg | INTRAVENOUS | Status: DC | PRN

## 2013-07-28 MED ORDER — SODIUM CHLORIDE 0.9 % IJ SOLN
3.0000 mL | Freq: Two times a day (BID) | INTRAMUSCULAR | Status: DC
  Administered 2013-07-28 – 2013-08-01 (×6): 3 mL via INTRAVENOUS

## 2013-07-28 MED ORDER — HYDROCODONE-ACETAMINOPHEN 5-325 MG PO TABS: 1.0000 | ORAL_TABLET | ORAL | Status: DC | PRN

## 2013-07-28 MED ORDER — GUAIFENESIN-DM 100-10 MG/5ML PO SYRP
5.0000 mL | ORAL_SOLUTION | ORAL | Status: DC | PRN
  Filled 2013-07-28: qty 5

## 2013-07-28 MED ORDER — SODIUM CHLORIDE 0.9 % IV BOLUS (SEPSIS): 500.0000 mL | Freq: Once | INTRAVENOUS | Status: DC

## 2013-07-28 MED ORDER — SODIUM CHLORIDE 0.9 % IV BOLUS (SEPSIS): 1000.0000 mL | INTRAVENOUS | Status: DC | PRN

## 2013-07-28 MED ORDER — ASPIRIN 81 MG PO TABS: 81.0000 mg | ORAL_TABLET | Freq: Every day | ORAL | Status: DC

## 2013-07-28 MED ORDER — POLYETHYLENE GLYCOL 3350 17 G PO PACK
17.0000 g | PACK | Freq: Every day | ORAL | Status: DC | PRN
  Filled 2013-07-28: qty 1

## 2013-07-28 MED ORDER — SODIUM CHLORIDE 0.9 % IV BOLUS (SEPSIS)
500.0000 mL | Freq: Once | INTRAVENOUS | Status: AC
  Administered 2013-07-28: 500 mL via INTRAVENOUS

## 2013-07-28 MED ORDER — LORATADINE 10 MG PO TABS
10.0000 mg | ORAL_TABLET | Freq: Every day | ORAL | Status: DC
  Administered 2013-07-28 – 2013-08-01 (×5): 10 mg via ORAL
  Filled 2013-07-28 (×5): qty 1

## 2013-07-28 MED ORDER — HYDROCORTISONE NA SUCCINATE PF 100 MG IJ SOLR
100.0000 mg | Freq: Three times a day (TID) | INTRAMUSCULAR | Status: DC
  Administered 2013-07-28 – 2013-07-30 (×5): 100 mg via INTRAVENOUS
  Filled 2013-07-28 (×8): qty 2

## 2013-07-28 MED ORDER — PANTOPRAZOLE SODIUM 40 MG PO TBEC
40.0000 mg | DELAYED_RELEASE_TABLET | Freq: Every day | ORAL | Status: DC
  Administered 2013-07-28 – 2013-08-01 (×5): 40 mg via ORAL
  Filled 2013-07-28 (×5): qty 1

## 2013-07-28 MED ORDER — SODIUM CHLORIDE 0.9 % IV SOLN
INTRAVENOUS | Status: DC
Start: 2013-07-28 — End: 2013-07-28

## 2013-07-28 MED ORDER — DEXTROSE 5 % IV SOLN
1.0000 g | INTRAVENOUS | Status: DC
  Filled 2013-07-28: qty 10

## 2013-07-28 MED ORDER — SODIUM CHLORIDE 0.9 % IV SOLN
INTRAVENOUS | Status: DC
  Administered 2013-07-28: 1000 mL via INTRAVENOUS

## 2013-07-28 MED ORDER — ACETAMINOPHEN 325 MG PO TABS
650.0000 mg | ORAL_TABLET | Freq: Once | ORAL | Status: AC
  Administered 2013-07-28: 650 mg via ORAL
  Filled 2013-07-28: qty 2

## 2013-07-28 MED ORDER — HEPARIN SODIUM (PORCINE) 5000 UNIT/ML IJ SOLN
5000.0000 [IU] | Freq: Three times a day (TID) | INTRAMUSCULAR | Status: DC
  Administered 2013-07-28 – 2013-08-01 (×12): 5000 [IU] via SUBCUTANEOUS
  Filled 2013-07-28 (×14): qty 1

## 2013-07-28 MED ORDER — ONDANSETRON HCL 4 MG/2ML IJ SOLN: 4.0000 mg | Freq: Four times a day (QID) | INTRAMUSCULAR | Status: DC | PRN

## 2013-07-28 MED ORDER — ONDANSETRON HCL 4 MG PO TABS: 4.0000 mg | ORAL_TABLET | Freq: Four times a day (QID) | ORAL | Status: DC | PRN

## 2013-07-28 MED ORDER — ASPIRIN EC 81 MG PO TBEC
81.0000 mg | DELAYED_RELEASE_TABLET | Freq: Every day | ORAL | Status: DC
  Administered 2013-07-28 – 2013-08-01 (×5): 81 mg via ORAL
  Filled 2013-07-28 (×5): qty 1

## 2013-07-28 MED ORDER — MIDODRINE HCL 2.5 MG PO TABS
2.5000 mg | ORAL_TABLET | Freq: Three times a day (TID) | ORAL | Status: DC
  Administered 2013-07-29 – 2013-07-30 (×5): 2.5 mg via ORAL
  Filled 2013-07-28 (×7): qty 1

## 2013-07-28 MED ORDER — LIDOCAINE HCL (PF) 1 % IJ SOLN
INTRAMUSCULAR | Status: AC
  Administered 2013-07-28: 2.1 mL
  Filled 2013-07-28: qty 5

## 2013-07-28 NOTE — Plan of Care (Signed)
[facility] [place] [facility], Roy MorganRobert Chan, 79 year old male with history of hypertension, pulmonary fibrosis, (followed by Dr. Delford FieldWright), aortic valve replacement, atrial fibrillation who was recently discharged on 6/25 by hospitalist service when he was admitted for near-syncopal episode from orthostatic hypotension.  Patient's home medications were being adjusted and was seen by his PCP on 6/29.  Today [facility] with dysuria, fevers, chills, was hypotensive in ED with temp of 101.5, systolic BP in low 90s despite IV fluids. UA positive for UTI.  Patient accepted to team 10.    Cheyrl Buley M.D. [facility] 07/28/2013, 2:01 PM  Pager: [phone]

## 2013-07-28 NOTE — ED Provider Notes (Signed)
CSN: [id number]     Arrival date & time 07/28/13  0902 History   First MD Initiated Contact with Patient 07/28/13 [phone]     Chief Complaint  Patient presents with  . Urinary Frequency     (Consider location/radiation/quality/duration/timing/severity/associated sxs/prior Treatment) HPI  [name] is a 79 y.o. male that presents to the ED for urinary frequency. Patient started having symptoms last night, having to wake up frequently to urinate. He has associated burning, urgency and hesitancy. No fevers, nausea or vomiting, flank pain, diarrhea or constipation but has had some chills. He uses oxygen at home for pulmonary fibrosis 2L O2. He recently finished augmentin for sinus infection and was recently discharged from the hospital for syncope.  Past Medical History  Diagnosis Date  . Hypertension   . Hyperlipidemia   . Aortic stenosis     AVR 2011  . Dermatitis     poison ivy  . Cleft palate   . Interstitial lung disease   . Pulmonary fibrosis   . Syncope and collapse 07/18/2014  . GERD (gastroesophageal reflux disease)    Past Surgical History  Procedure Laterality Date  . Bilat cataract sx  2004  . Cholecystectomy  1999  . 7 surgeries for cleft palate  1938  . Oral surgery with bone graft from hip for cleft palate  1989  . Right groin hernia repair  1950's  . Rotator cuff tear, left shoulder    . Aortic valve replacement w/ pericardial tissue valve  10-15-09    Dr Barry Dieneswens   Family History  Problem Relation Age of Onset  . Heart attack Mother   . Cancer Father     lung and stomach  . Heart attack Father   . Cancer Sister     bone  . Diabetes Sister   . Heart murmur Brother   . Hypertension Brother   . Hyperlipidemia Brother   . Heart attack Brother    History  Substance Use Topics  . Smoking status: Former Smoker -- 1.50 packs/day for 5 years    Types: Cigarettes, Cigars    Quit date: 09/08/1977  . Smokeless tobacco: Never Used  . Alcohol Use: No     Review of Systems  Constitutional: Positive for chills. Negative for fever.  Genitourinary: Positive for dysuria and frequency. Negative for hematuria and flank pain.  All other systems reviewed and are negative.     Allergies  Review of patient's allergies indicates no known allergies.  Home Medications   Prior to Admission medications   Medication Sig Start Date End Date Taking? Authorizing Provider  aspirin 81 MG tablet Take 81 mg by mouth daily.      Historical Provider, MD  benzonatate (TESSALON PERLES) 100 MG capsule Take 1 capsule (100 mg total) by mouth 3 (three) times daily as needed for cough. 07/08/13   Audree CamelScott T Goldston, MD  ferrous sulfate 325 (65 FE) MG EC tablet Take 1 tablet (325 mg total) by mouth 3 (three) times daily with meals. 07/23/13   Sean Hommel, DO  loratadine (CLARITIN) 10 MG tablet Take 10 mg by mouth daily.    Historical Provider, MD  midodrine (PROAMATINE) 2.5 MG tablet Take 1 tablet (2.5 mg total) by mouth 3 (three) times daily with meals. 07/23/13   Sean Hommel, DO  OVER THE COUNTER MEDICATION Super beta prostate - take 1 tablet by mouth twice daily    Historical Provider, MD  pantoprazole (PROTONIX) 40 MG tablet Take 40 mg by  mouth daily.    Historical Provider, MD  pravastatin (PRAVACHOL) 40 MG tablet Take 40 mg by mouth daily.    Historical Provider, MD  predniSONE (DELTASONE) 10 MG tablet Take 20 mg by mouth daily with breakfast.    Historical Provider, MD   BP 105/62  Pulse 102  Temp(Src) 98.5 F (36.9 C) (Oral)  Resp 24  Ht 6\' 1"  (1.854 m)  Wt 225 lb (102.059 kg)  BMI 29.69 kg/m2  SpO2 100% Physical Exam  Constitutional: He is oriented to person, place, and time. He appears well-developed.  Eyes: Conjunctivae and EOM are normal. Pupils are equal, round, and reactive to light.  Cardiovascular: Normal rate, regular rhythm and normal heart sounds.   Pulmonary/Chest: Effort normal. No respiratory distress. He has no wheezes. He has rales in  the right middle field, the right lower field, the left middle field and the left lower field.  Nasal canula in place  Abdominal: Soft. Normal appearance. There is no tenderness. There is no CVA tenderness.  Neurological: He is alert and oriented to person, place, and time.  Skin: Skin is warm, dry and intact.    ED Course  Procedures (including critical care time) Labs Review Labs Reviewed  URINALYSIS, ROUTINE W REFLEX MICROSCOPIC - Abnormal; Notable for the following:    APPearance CLOUDY (*)    Hgb urine dipstick LARGE (*)    Protein, ur >300 (*)    Nitrite POSITIVE (*)    Leukocytes, UA SMALL (*)    All other components within normal limits  URINE MICROSCOPIC-ADD ON - Abnormal; Notable for the following:    Bacteria, UA MANY (*)    All other components within normal limits  COMPREHENSIVE METABOLIC PANEL - Abnormal; Notable for the following:    Sodium 135 (*)    Glucose, Bld 109 (*)    Albumin 3.1 (*)    GFR calc non Af Amer 71 (*)    GFR calc Af Amer 82 (*)    All other components within normal limits  CBC WITH DIFFERENTIAL - Abnormal; Notable for the following:    WBC 13.2 (*)    RBC 3.34 (*)    Hemoglobin 9.1 (*)    HCT 27.0 (*)    RDW 17.8 (*)    Neutrophils Relative % 85 (*)    Lymphocytes Relative 7 (*)    Neutro Abs 11.4 (*)    All other components within normal limits  CBC - Abnormal; Notable for the following:    WBC 13.5 (*)    RBC 3.24 (*)    Hemoglobin 8.6 (*)    HCT 25.7 (*)    RDW 17.7 (*)    Platelets 140 (*)    All other components within normal limits  CREATININE, SERUM - Abnormal; Notable for the following:    GFR calc non Af Amer 67 (*)    GFR calc Af Amer 78 (*)    All other components within normal limits  MRSA PCR SCREENING  URINE CULTURE  CULTURE, BLOOD (ROUTINE X 2)  LACTIC ACID, PLASMA  PROTIME-INR  PROCALCITONIN  BASIC METABOLIC PANEL  CBC  I-STAT CG4 LACTIC ACID, ED    Imaging Review No results found.   EKG  Interpretation None      MDM   Final diagnoses:  Urinary tract infection with hematuria, site unspecified  Hypotension, unspecified hypotension type   Patient initially afebrile with BP of 100/62. Found to have a UTI and given 1g of Rocephin. Patient then  developed a fever up to 101.5 and hypotensive to 74/50. Tylenol and 500ml NS bolus given. Blood cultures obtained, but after patient already received antibiotics. Patient recently admitted for syncope due to orthostatic hypotension. Blood pressure medication had been adjusted before discharge. [facility] called and will accept patient for admission for pyelonephritis and hypotension. Patient not altered. Meets sepsis criteria with fever, elevated WBC and tachypnea. Source of infection is UTI. Patient however is stable for transfer to step down level care.    Jacquelin Hawkingalph Joshual Terrio, MD 07/28/13 [phone]

## 2013-07-28 NOTE — H&P (Signed)
Patient Demographics  Roy MorganRobert Pirozzi, is a 79 y.o. male  MRN: [id number]   DOB - Jun 13, 1934  Admit Date - 07/28/2013  Outpatient Primary MD for the patient is Laren BoomHommel, Sean, DO  Cardiologist Dr. Jens Somrenshaw, Pulmonary physician Dr. Shan LevansPatrick Wright.   With History of -  Past Medical History  Diagnosis Date  . Hypertension   . Hyperlipidemia   . Aortic stenosis     AVR 2011  . Dermatitis     poison ivy  . Cleft palate   . Interstitial lung disease   . Pulmonary fibrosis   . Syncope and collapse 07/18/2014  . GERD (gastroesophageal reflux disease)       Past Surgical History  Procedure Laterality Date  . Bilat cataract sx  2004  . Cholecystectomy  1999  . 7 surgeries for cleft palate  1938  . Oral surgery with bone graft from hip for cleft palate  1989  . Right groin hernia repair  1950's  . Rotator cuff tear, left shoulder    . Aortic valve replacement w/ pericardial tissue valve  10-15-09    Dr Barry Dieneswens    in for   Chief Complaint  Patient presents with  . Urinary Frequency     HPI  [name]  is a 79 y.o. male, with history of aortic stenosis status post bovine aortic valve replacement, chronic pulmonary fibrosis on chronic prednisone and 2 L nasal cannula home oxygen follows with Dr. Shan LevansPatrick Wright, orthostatic hypotension for which he was [facility] few weeks ago [facility], chronic anemia nonspecific, GERD, hypertension, dyslipidemia who is also hard of hearing presented [facility] with 12-15 hour history of dysuria, fever and chills.  [facility] his workup was consistent with sepsis secondary to urinary tract infection, he was hypotensive, had leukocytosis, he received IV antibiotics, urine and blood cultures were drawn, IV fluids were given with improvement in  blood pressure, he was then [facility].  And the patient is feeling better, denies any fevers at this time, no headache, no chest pain, no cough phlegm shortness of breath, denies any abdominal pain, no diarrhea, denies any knowledge of prostate problems, no previous UTIs, last admission he did not receive a Foley catheter. No focal weakness.    Review of Systems    In addition to the HPI above,  ++Fever-chills, No Headache, No changes with Vision or hearing, No problems swallowing food or Liquids, No Chest pain, Cough or Shortness of Breath, No Abdominal pain, No Nausea or Vommitting, Bowel movements are regular, No Blood in stool or Urine, ++ dysuria, No new skin rashes or bruises, No new joints pains-aches,  No new weakness, tingling, numbness in any extremity, No recent weight gain or loss, No polyuria, polydypsia or polyphagia, No significant Mental Stressors.  A full 10 point Review of Systems was done, except as stated above, all  other Review of Systems were negative.   Social History History  Substance Use Topics  . Smoking status: Former Smoker -- 1.50 packs/day for 5 years    Types: Cigarettes, Cigars    Quit date: 09/08/1977  . Smokeless tobacco: Never Used  . Alcohol Use: No      Family History Family History  Problem Relation Age of Onset  . Heart attack Mother   . Cancer Father     lung and stomach  . Heart attack Father   . Cancer Sister     bone  . Diabetes Sister   . Heart murmur Brother   . Hypertension Brother   . Hyperlipidemia Brother   . Heart attack Brother       Prior to Admission medications   Medication Sig Start Date End Date Taking? Authorizing Provider  aspirin 81 MG tablet Take 81 mg by mouth daily.      Historical Provider, MD  benzonatate (TESSALON PERLES) 100 MG capsule Take 1 capsule (100 mg total) by mouth 3 (three) times daily as needed for cough. 07/08/13   Audree Camel, MD  ferrous sulfate 325 (65 FE) MG EC tablet Take 1 tablet (325 mg total) by mouth 3 (three) times daily with meals. 07/23/13   Sean Hommel, DO  loratadine (CLARITIN) 10 MG tablet Take 10 mg by mouth daily.    Historical Provider, MD  midodrine (PROAMATINE) 2.5 MG tablet Take 1 tablet (2.5 mg total) by mouth 3 (three) times daily with meals. 07/23/13   Sean Hommel, DO  OVER THE COUNTER MEDICATION Super beta prostate - take 1 tablet by mouth twice daily    Historical Provider, MD  pantoprazole (PROTONIX) 40 MG tablet Take 40 mg by mouth daily.    Historical Provider, MD  pravastatin (PRAVACHOL) 40 MG tablet Take 40 mg by mouth daily.    Historical Provider, MD  predniSONE (DELTASONE) 10 MG tablet Take 20 mg by mouth daily with breakfast.    Historical Provider, MD    No Known Allergies  Physical Exam  Vitals  Blood pressure 82/54, pulse 90, temperature 99.3 F (37.4 C), temperature source Oral, resp. rate 20, height 6\' 1"  (1.854 m), weight 102.059 kg (225 lb), SpO2 99.00%.   1. General elderly white male lying in bed in NAD,     2. Normal affect and insight, Not Suicidal or Homicidal, Awake Alert, Oriented X 3.  3. No F.N deficits, ALL C.Nerves Intact, Strength 5/5 all 4 extremities, Sensation intact all 4 extremities, Plantars down going.  4. Ears and Eyes appear Normal, Conjunctivae clear, PERRLA. Moist Oral Mucosa.  5. Supple Neck, No JVD, No cervical lymphadenopathy appriciated, No Carotid Bruits.  6. Symmetrical Chest wall movement, Good air movement bilaterally, bilateral diffuse rales  7. RRR, No Gallops, Rubs , mild aortic valve systolic Murmur, No Parasternal Heave.  8. Positive Bowel Sounds, Abdomen Soft, No tenderness, No organomegaly appriciated,No rebound -guarding or rigidity.  9.  No Cyanosis, Normal Skin Turgor, No Skin Rash or Bruise.  10. Good muscle tone,  joints appear normal , no effusions, Normal ROM.  11. No Palpable Lymph Nodes in Neck or  Axillae     Data Review  CBC  Recent Labs Lab 07/28/13 1240  WBC 13.2*  HGB 9.1*  HCT 27.0*  PLT 151  MCV 80.8  MCH 27.2  MCHC 33.7  RDW 17.8*  LYMPHSABS 0.9  MONOABS 0.9  EOSABS 0.0  BASOSABS 0.0   ------------------------------------------------------------------------------------------------------------------  Chemistries  Recent Labs Lab 07/28/13 1240  NA 135*  K 4.9  CL 98  CO2 25  GLUCOSE 109*  BUN 16  CREATININE 1.00  CALCIUM 9.1  AST 28  ALT 23  ALKPHOS 54  BILITOT 0.8   ------------------------------------------------------------------------------------------------------------------ estimated creatinine clearance is 78.9 ml/min (by C-G formula based on Cr of 1). ------------------------------------------------------------------------------------------------------------------ No results found for this basename: TSH, T4TOTAL, FREET3, T3FREE, THYROIDAB,  in the last 72 hours   Coagulation profile No results found for this basename: INR, PROTIME,  in the last 168 hours ------------------------------------------------------------------------------------------------------------------- No results found for this basename: DDIMER,  in the last 72 hours -------------------------------------------------------------------------------------------------------------------  Cardiac Enzymes No results found for this basename: CK, CKMB, TROPONINI, MYOGLOBIN,  in the last 168 hours ------------------------------------------------------------------------------------------------------------------ No components found with this basename: POCBNP,    ---------------------------------------------------------------------------------------------------------------  Urinalysis    Component Value Date/Time   COLORURINE YELLOW 07/28/2013 0920   APPEARANCEUR CLOUDY* 07/28/2013 0920   LABSPEC 1.015 07/28/2013 0920   PHURINE 6.5 07/28/2013 0920   GLUCOSEU NEGATIVE 07/28/2013 0920    HGBUR LARGE* 07/28/2013 0920   HGBUR 2+ 11/14/2010 0926   BILIRUBINUR NEGATIVE 07/28/2013 0920   BILIRUBINUR neg 02/25/2011 1622   KETONESUR NEGATIVE 07/28/2013 0920   PROTEINUR >300* 07/28/2013 0920   PROTEINUR >=300 02/25/2011 1622   UROBILINOGEN 1.0 07/28/2013 0920   UROBILINOGEN 1.0 02/25/2011 1622   NITRITE POSITIVE* 07/28/2013 0920   NITRITE pos 02/25/2011 1622   LEUKOCYTESUR SMALL* 07/28/2013 0920    ----------------------------------------------------------------------------------------------------------------  Imaging results:      Assessment & Plan   1. Sepsis secondary to UTI. Admit to step down, check urine and blood cultures, continue IV Rocephin to be dosed by pharmacy, IV fluids, since he is hypotensive and chronically on prednisone we'll place him on stress dose IV hydrocortisone, will check lactic acid and Procalcitonin, will check renal ultrasound to rule out pyelonephritis, monitor closely. Outpatient workup for possible BPH.    2. History of interstitial pulmonary fibrosis. With chronic rales on exam, status post 2 L nasal cannula oxygen at home, chronically on prednisone, currently stable from the standpoint, continue oxygen, and nebulizer treatments as needed. Outpatient followup with primary pulmonologist Dr. Delford FieldWright upon discharge.     3. 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 No acute issues.     4. History of paroxysmal atrial fibrillation. Currently on aspirin, blood pressure chronically low not on any rate control agents, appears to be in sinus on telemetry, will continue monitoring on telemetry, IV Lopressor as needed, poor candidate for anticoagulation due to history of orthostasis and passing out with falls. We'll defer long-term management to primary cardiologist Dr. Jens Somrenshaw.     5. History of orthostatic hypotension with recent episode of syncope and fall requiring hospitalization few weeks ago. Stable, stress dose steroids as in #1,  continue home dose ProAmatine, IV fluids for hypotension. We'll monitor orthostatic blood pressure from tomorrow.     6. GERD. On PPI continue.     7. Dyslipidemia. Continue home dose statin.     8. Chronic anemia. Monitor, expect some fall with dilution.      DVT Prophylaxis Heparin    AM Labs Ordered, also please review Full Orders  Family Communication: Admission, patients condition and plan of care including tests being ordered have been discussed with the patient and wife who indicate understanding and agree with the plan and Code Status.  Code Status Full  Likely DC to  Home  Condition GUARDED  Time spent in minutes : 35    Mersadez Linden K M.D on 07/28/2013 at 5:31 PM  Between 7am to 7pm - Pager - [phone]  After 7pm go to [email or web address] - password [email or web address]  And look for the night coverage person covering me after hours  [facility] Office  [phone]   **Disclaimer: This note may have been dictated with voice recognition software. Similar sounding words can inadvertently be transcribed and this note may contain transcription errors which may not have been corrected upon publication of note.**

## 2013-07-28 NOTE — ED Notes (Signed)
Changed bed linen after patient had urinated

## 2013-07-29 ENCOUNTER — Inpatient Hospital Stay (HOSPITAL_COMMUNITY): Payer: Medicare Other

## 2013-07-29 LAB — BASIC METABOLIC PANEL
Anion gap: 13 (ref 5–15)
BUN: 17 mg/dL (ref 6–23)
CO2: 25 mEq/L (ref 19–32)
CREATININE: 1.03 mg/dL (ref 0.50–1.35)
Calcium: 8.7 mg/dL (ref 8.4–10.5)
Chloride: 98 mEq/L (ref 96–112)
GFR calc Af Amer: 79 mL/min — ABNORMAL LOW (ref >90)
GFR calc non Af Amer: 68 mL/min — ABNORMAL LOW (ref >90)
GLUCOSE: 137 mg/dL — AB (ref 70–99)
POTASSIUM: 5.5 meq/L — AB (ref 3.7–5.3)
Sodium: 136 mEq/L — ABNORMAL LOW (ref 137–147)

## 2013-07-29 LAB — POTASSIUM: Potassium: 4.6 meq/L (ref 3.7–5.3)

## 2013-07-29 LAB — CBC
HCT: 29.5 % — ABNORMAL LOW (ref 39.0–52.0)
HEMOGLOBIN: 9.3 g/dL — AB (ref 13.0–17.0)
MCH: 26.6 pg (ref 26.0–34.0)
MCHC: 31.5 g/dL (ref 30.0–36.0)
MCV: 84.5 fL (ref 78.0–100.0)
Platelets: 112 10*3/uL — ABNORMAL LOW (ref 150–400)
RBC: 3.49 MIL/uL — AB (ref 4.22–5.81)
RDW: 17.9 % — ABNORMAL HIGH (ref 11.5–15.5)
WBC: 18.4 10*3/uL — ABNORMAL HIGH (ref 4.0–10.5)

## 2013-07-29 MED ORDER — IOHEXOL 300 MG/ML  SOLN
100.0000 mL | Freq: Once | INTRAMUSCULAR | Status: AC | PRN
  Administered 2013-07-29: 100 mL via INTRAVENOUS

## 2013-07-29 MED ORDER — SODIUM CHLORIDE 0.9 % IV BOLUS (SEPSIS)
250.0000 mL | Freq: Once | INTRAVENOUS | Status: AC
  Administered 2013-07-29: 250 mL via INTRAVENOUS

## 2013-07-29 MED ORDER — SODIUM CHLORIDE 0.9 % IV SOLN
INTRAVENOUS | Status: DC
  Administered 2013-07-29: 11:00:00 via INTRAVENOUS

## 2013-07-29 MED ORDER — SODIUM CHLORIDE 0.9 % IV SOLN
1.0000 g | Freq: Three times a day (TID) | INTRAVENOUS | Status: DC
  Administered 2013-07-29 – 2013-07-30 (×3): 1 g via INTRAVENOUS
  Filled 2013-07-29 (×7): qty 1

## 2013-07-29 MED ORDER — SODIUM POLYSTYRENE SULFONATE 15 GM/60ML PO SUSP
30.0000 g | Freq: Once | ORAL | Status: AC
  Administered 2013-07-29: 30 g via ORAL
  Filled 2013-07-29: qty 120

## 2013-07-29 MED ORDER — DOCUSATE SODIUM 100 MG PO CAPS
200.0000 mg | ORAL_CAPSULE | Freq: Two times a day (BID) | ORAL | Status: DC
  Administered 2013-07-29 – 2013-08-01 (×7): 200 mg via ORAL
  Filled 2013-07-29 (×8): qty 2

## 2013-07-29 MED ORDER — HYDROCODONE-ACETAMINOPHEN 5-325 MG PO TABS: 1.0000 | ORAL_TABLET | Freq: Four times a day (QID) | ORAL | Status: DC | PRN

## 2013-07-29 NOTE — ED Provider Notes (Signed)
I saw and evaluated the patient, reviewed the resident's note and I agree with the findings and plan.   .Face to face Exam:  General:  Awake HEENT:  Atraumatic Resp:  Normal effort Abd:  Nondistended Neuro:No focal weakness   Nelia Shiobert L Fuquan Wilson, MD 07/29/13 [phone]

## 2013-07-29 NOTE — Progress Notes (Signed)
ANTIBIOTIC CONSULT NOTE - INITIAL  Pharmacy Consult for Merrem Indication: sepsis 2/2 UTI  Allergies  Allergen Reactions  . Pravastatin Sodium Other (See Comments)    Sneezing episodes, can take medication and tolerates reaction.    Patient Measurements: Height: 6' (182.9 cm) Weight: 225 lb 5 oz (102.2 kg) IBW/kg (Calculated) : 77.6  Vital Signs: Temp: 97.9 F (36.6 C) (07/05 0304) Temp src: Oral (07/05 0304) BP: 116/60 mmHg (07/05 0304) Pulse Rate: 77 (07/05 0304) Intake/Output from previous day: 07/04 0701 - 07/05 0700 In: 825 [I.V.:825] Out: 700 [Urine:700] Intake/Output from this shift: Total I/O In: 825 [I.V.:825] Out: 500 [Urine:500]  Labs:  Recent Labs  07/28/13 1240 07/28/13 1840 07/29/13 0314  WBC 13.2* 13.5* 18.4*  HGB 9.1* 8.6* 9.3*  PLT 151 140* 112*  CREATININE 1.00 1.05 1.03   Estimated Creatinine Clearance: 75.4 ml/min (by C-G formula based on Cr of 1.03).   Microbiology: Recent Results (from the past 720 hour(s))  URINE CULTURE     Status: None   Collection Time    07/17/13  9:00 AM      Result Value Ref Range Status   Specimen Description URINE, RANDOM   Final   Special Requests NONE   Final   Culture  Setup Time     Final   Value: 07/17/2013 09:54     Performed at [facility]   Colony Count     Final   Value: NO GROWTH     Performed at [facility]   Culture     Final   Value: NO GROWTH     Performed at [facility]   Report Status 07/18/2013 FINAL   Final  MRSA PCR SCREENING     Status: None   Collection Time    07/28/13  6:19 PM      Result Value Ref Range Status   MRSA by PCR NEGATIVE  NEGATIVE Final   Comment:            The GeneXpert MRSA Assay (FDA     approved for NASAL specimens     only), is one component of a     comprehensive MRSA colonization     surveillance program. It is not     intended to diagnose MRSA     infection nor to guide or     monitor treatment for     MRSA infections.     Medical History: Past Medical History  Diagnosis Date  . Hypertension   . Hyperlipidemia   . Aortic stenosis     AVR 2011  . Dermatitis     poison ivy  . Cleft palate   . Interstitial lung disease   . Pulmonary fibrosis   . Syncope and collapse 07/18/2014  . GERD (gastroesophageal reflux disease)     Medications:  Prescriptions prior to admission  Medication Sig Dispense Refill  . aspirin 81 MG tablet Take 81 mg by mouth every morning.       . benzonatate (TESSALON PERLES) 100 MG capsule Take 1 capsule (100 mg total) by mouth 3 (three) times daily as needed for cough.  20 capsule  0  . ferrous sulfate 325 (65 FE) MG EC tablet Take 1 tablet (325 mg total) by mouth 3 (three) times daily with meals.    3  . loratadine (CLARITIN) 10 MG tablet Take 10 mg by mouth every morning.       . midodrine (PROAMATINE) 2.5 MG tablet Take 1  tablet (2.5 mg total) by mouth 3 (three) times daily with meals.  90 tablet  1  . Multiple Vitamin (MULTIVITAMIN WITH MINERALS) TABS tablet Take 1 tablet by mouth every morning.       . neomycin-bacitracin-polymyxin (NEOSPORIN) OINT Apply 1 application topically as needed for irritation or wound care.      Marland Kitchen. OVER THE COUNTER MEDICATION Super beta prostate - take 1 tablet by mouth twice daily      . pantoprazole (PROTONIX) 40 MG tablet Take 40 mg by mouth every morning.       . pravastatin (PRAVACHOL) 40 MG tablet Take 40 mg by mouth every evening.       . predniSONE (DELTASONE) 10 MG tablet Take 20 mg by mouth daily with breakfast.      . SALINE NASAL MIST NA Place 1 spray into the nose daily as needed (for congestion).       Scheduled:  . aspirin EC  81 mg Oral Daily  . heparin  5,000 Units Subcutaneous 3 times per day  . hydrocortisone sod succinate (SOLU-CORTEF) inj  100 mg Intravenous Q8H  . loratadine  10 mg Oral Daily  . midodrine  2.5 mg Oral TID WC  . pantoprazole  40 mg Oral Daily  . simvastatin  10 mg Oral q1800  . sodium chloride  3 mL  Intravenous Q12H  . sodium polystyrene  30 g Oral Once   Infusions:  . sodium chloride 1,000 mL (07/28/13 2035)    Assessment: 79yo male c/o urinary frequency associated w/ burning and hesitancy, found to be febrile and hypotensive resistant to fluids, to begin IV ABX.  Plan:  Will begin Merrem 1g IV Q8H and monitor CBC and Cx.  Vernard GamblesVeronda Alvin Diffee, PharmD, BCPS  07/29/2013,6:51 AM

## 2013-07-29 NOTE — Progress Notes (Addendum)
Patient Demographics  Roy MorganRobert Chan, is a 79 y.o. male, DOB - May 27, 1934, [name]:[id number]  Admit date - 07/28/2013   Admitting Physician Ripudeep Jenna LuoK Rai, MD  Outpatient Primary MD for the patient is Laren BoomHommel, Sean, DO  LOS - 1   Chief Complaint  Patient presents with  . Urinary Frequency        Subjective:   [name] today has, No headache, No chest pain, No abdominal pain - No Nausea, No new weakness tingling or numbness, No Cough - SOB. Overall feels much better    Assessment & Plan    1. Sepsis secondary to UTI. Clinically much improved, renal ultrasound rules out hydronephrosis, blood and urine cultures are pending, continue stress dose steroids, since Leukocytosis slightly worse on Rocephin we'll switch him on meropenem as there is a possibility of ESBL organism due to his recent hospitalization. Blood pressures are better and he's not orthostatic, will need Outpatient workup for possible BPH.     2. History of interstitial pulmonary fibrosis. With chronic rales on exam, status post 2 L nasal cannula oxygen at home, chronically on prednisone, currently stable from the standpoint, continue oxygen, and nebulizer treatments as needed. Outpatient followup with primary pulmonologist Dr. Delford FieldWright upon discharge.     3. 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 No acute issues.     4. History of paroxysmal atrial fibrillation. Currently on aspirin, as needed IV Lopressor for rate control, not on any rate controlling agents chronically. We'll defer long-term management to primary cardiologist Dr. Jens Somrenshaw.     5. History of orthostatic hypotension with recent episode of syncope and fall requiring hospitalization few weeks ago. Stable, stress dose steroids as in #1, continue  home dose ProAmatine, continue gentle hydration. Not orthostatic this morning. Will apply TED stockings.     6. GERD. On PPI continue.     7. Dyslipidemia. Continue home dose statin.     8. Chronic anemia. Stable.    9. High potassium. One dose of Kayexalate and repeat potassium levels.    10. Possible bladder mass on ultrasound. Check CT scan. Outpatient urology followup.     Code Status: Full  Family Communication: Wife bedside  Disposition Plan: Home   Procedures renal ultrasound, CT scan abdomen and pelvis with contrast   Consults      Medications  Scheduled Meds: . aspirin EC  81 mg Oral Daily  . heparin  5,000 Units Subcutaneous 3 times per day  . hydrocortisone sod succinate (SOLU-CORTEF) inj  100 mg Intravenous Q8H  . loratadine  10 mg Oral Daily  . meropenem (MERREM) IV  1 g Intravenous 3 times per day  . midodrine  2.5 mg Oral TID WC  . pantoprazole  40 mg Oral Daily  . simvastatin  10 mg Oral q1800  . sodium chloride  3 mL Intravenous Q12H  . sodium polystyrene  30 g Oral Once   Continuous Infusions: . sodium chloride     PRN Meds:.guaiFENesin-dextromethorphan, HYDROcodone-acetaminophen, metoprolol, ondansetron (ZOFRAN) IV, ondansetron, polyethylene glycol, sodium chloride  DVT Prophylaxis   Heparin    Lab Results  Component Value Date   PLT 112* 07/29/2013    Antibiotics    Anti-infectives   Start  Dose/Rate Route Frequency Ordered Stop   07/29/13 1100  cefTRIAXone (ROCEPHIN) 1 g in dextrose 5 % 50 mL IVPB  Status:  Discontinued     1 g 100 mL/hr over 30 Minutes Intravenous Every 24 hours 07/28/13 1718 07/29/13 0624   07/29/13 0700  meropenem (MERREM) 1 g in sodium chloride 0.9 % 100 mL IVPB     1 g 200 mL/hr over 30 Minutes Intravenous 3 times per day 07/29/13 0652     07/28/13 1100  cefTRIAXone (ROCEPHIN) 1 g in dextrose 5 % 50 mL IVPB     1 g 100 mL/hr over 30 Minutes Intravenous  Once 07/28/13 1047 07/28/13 1141   07/28/13  1054  cefTRIAXone (ROCEPHIN) 1 G injection    Comments:  Mabe, Steven   : cabinet override      07/28/13 1054 07/28/13 1108          Objective:   Filed Vitals:   07/28/13 1703 07/28/13 2017 07/28/13 2319 07/29/13 0304  BP: 131/56 123/55 136/98 116/60  Pulse: 95 86 73 77  Temp: 99.7 F (37.6 C) 100.6 F (38.1 C) 99.5 F (37.5 C) 97.9 F (36.6 C)  TempSrc: Oral Oral Oral Oral  Resp: 25 32 34 27  Height: 6' (1.829 m)     Weight: 99.7 kg (219 lb 12.8 oz)   102.2 kg (225 lb 5 oz)  SpO2: 99% 97% 90% 97%    Wt Readings from Last 3 Encounters:  07/29/13 102.2 kg (225 lb 5 oz)  07/23/13 102.059 kg (225 lb)  07/18/13 102.694 kg (226 lb 6.4 oz)     Intake/Output Summary (Last 24 hours) at 07/29/13 0857 Last data filed at 07/29/13 0600  Gross per 24 hour  Intake    825 ml  Output    700 ml  Net    125 ml     Physical Exam  Awake Alert, Oriented X 3, No new F.N deficits, Normal affect Pelzer.AT,PERRAL Supple Neck,No JVD, No cervical lymphadenopathy appriciated.  Symmetrical Chest wall movement, Good air movement bilaterally, CTAB RRR,No Gallops,Rubs or new Murmurs, No Parasternal Heave +ve B.Sounds, Abd Soft, No tenderness, No organomegaly appriciated, No rebound - guarding or rigidity. No Cyanosis, Clubbing or edema, No new Rash or bruise      Data Review   Micro Results Recent Results (from the past 240 hour(s))  MRSA PCR SCREENING     Status: None   Collection Time    07/28/13  6:19 PM      Result Value Ref Range Status   MRSA by PCR NEGATIVE  NEGATIVE Final   Comment:            The GeneXpert MRSA Assay (FDA     approved for NASAL specimens     only), is one component of a     comprehensive MRSA colonization     surveillance program. It is not     intended to diagnose MRSA     infection nor to guide or     monitor treatment for     MRSA infections.    Radiology Reports Dg Chest 2 View  07/17/2013   CLINICAL DATA:  Loss of consciousness. Weakness for 1  day. Aortic valve replacement in 2011.  EXAM: CHEST  2 VIEW  COMPARISON:  07/08/2013.  FINDINGS: Median sternotomy and bioprosthetic aortic valve. Cholecystectomy clips are present in the right upper quadrant. Diffuse interstitial opacity is present compatible with pulmonary fibrosis demonstrated on prior CT. No definite superimposed  airspace opacity. The cardiopericardial silhouette is within normal limits for projection. Mediastinal contours are unchanged. Unchanged rightward tracheal deviation. Chronic right AC joint separation.  IMPRESSION: Chronic interstitial lung disease without acute cardiopulmonary disease.   Electronically Signed   By: Andreas NewportGeoffrey  Lamke M.D.   On: 07/17/2013 07:24   Dg Chest 2 View  07/08/2013   CLINICAL DATA:  Productive cough.  EXAM: CHEST  2 VIEW  COMPARISON:  08/03/2012 and CT 05/2012  FINDINGS: Two views of the chest were obtained. Patient has known interstitial lung disease based on the previous CT. Again noted are prominent interstitial lung markings with some volume loss in the right lung. Retrocardiac density is compatible with a large hiatal hernia. Heart size is stable and upper limits of normal. Median sternotomy wires are noted. Previous aortic valve surgery.  IMPRESSION: Diffuse interstitial lung densities likely represent chronic changes. Difficult to exclude mild edema. Minimal change since the previous examination.   Electronically Signed   By: Richarda OverlieAdam  Henn M.D.   On: 07/08/2013 08:26   [place] Renal  07/28/2013   CLINICAL DATA:  Acute renal failure  EXAM: RENAL/URINARY TRACT ULTRASOUND COMPLETE  COMPARISON:  None.  FINDINGS: Right Kidney:  Length: 10.4 cm. Echogenicity is within normal limits. No hydronephrosis. 1.5 cm hypoechoic area in the upper pole, likely small cyst.  Left Kidney:  Length: 11.1 cm. Echogenicity within normal limits. No mass or hydronephrosis visualized.  Bladder:  Abnormal soft tissue noted along the posterior bladder wall. This does not significantly  change when placing the patient in the right lateral decubitus position. There appears to be some internal blood flow. Cannot exclude bladder wall lesion/mass.  IMPRESSION: No evidence of hydronephrosis.  Question posterior soft tissue bladder wall mass. Recommend further evaluation with CT of the abdomen and pelvis without and with contrast, to include delayed prone images through the bladder.   Electronically Signed   By: Charlett NoseKevin  Dover M.D.   On: 07/28/2013 19:33    CBC  Recent Labs Lab 07/28/13 1240 07/28/13 1840 07/29/13 0314  WBC 13.2* 13.5* 18.4*  HGB 9.1* 8.6* 9.3*  HCT 27.0* 25.7* 29.5*  PLT 151 140* 112*  MCV 80.8 79.3 84.5  MCH 27.2 26.5 26.6  MCHC 33.7 33.5 31.5  RDW 17.8* 17.7* 17.9*  LYMPHSABS 0.9  --   --   MONOABS 0.9  --   --   EOSABS 0.0  --   --   BASOSABS 0.0  --   --     Chemistries   Recent Labs Lab 07/28/13 1240 07/28/13 1840 07/29/13 0314  NA 135*  --  136*  K 4.9  --  5.5*  CL 98  --  98  CO2 25  --  25  GLUCOSE 109*  --  137*  BUN 16  --  17  CREATININE 1.00 1.05 1.03  CALCIUM 9.1  --  8.7  AST 28  --   --   ALT 23  --   --   ALKPHOS 54  --   --   BILITOT 0.8  --   --    ------------------------------------------------------------------------------------------------------------------ estimated creatinine clearance is 75.4 ml/min (by C-G formula based on Cr of 1.03). ------------------------------------------------------------------------------------------------------------------ No results found for this basename: HGBA1C,  in the last 72 hours ------------------------------------------------------------------------------------------------------------------ No results found for this basename: CHOL, HDL, LDLCALC, TRIG, CHOLHDL, LDLDIRECT,  in the last 72 hours ------------------------------------------------------------------------------------------------------------------ No results found for this basename: TSH, T4TOTAL, FREET3, T3FREE,  THYROIDAB,  in the last 72 hours ------------------------------------------------------------------------------------------------------------------  No results found for this basename: VITAMINB12, FOLATE, FERRITIN, TIBC, IRON, RETICCTPCT,  in the last 72 hours  Coagulation profile  Recent Labs Lab 07/28/13 1840  INR 1.18    No results found for this basename: DDIMER,  in the last 72 hours  Cardiac Enzymes No results found for this basename: CK, CKMB, TROPONINI, MYOGLOBIN,  in the last 168 hours ------------------------------------------------------------------------------------------------------------------ No components found with this basename: POCBNP,      Time Spent in minutes 35   Nayra Coury K M.D on 07/29/2013 at 8:57 AM  Between 7am to 7pm - Pager - [phone]  After 7pm go to [email or web address] - password [email or web address]  And look for the night coverage person covering for me after hours  [facility] Office  [phone]   **Disclaimer: This note may have been dictated with voice recognition software. Similar sounding words can inadvertently be transcribed and this note may contain transcription errors which may not have been corrected upon publication of note.**

## 2013-07-29 NOTE — Progress Notes (Addendum)
NURSING PROGRESS NOTE  [name] [id number] Transfer Data: 07/29/2013 6:03 PM Attending Provider: Leroy SeaPrashant K Singh, MD WUJ:[id number]:Hommel, Gregary SignsSean, DO Code Status: FULL  [name] is a 79 y.o. male patient transferred from 3S -No acute distress noted.  -No complaints of shortness of breath.  -No complaints of chest pain.   Cardiac Monitoring: Box # 11  in place. Cardiac monitor yields:normal sinus rhythm.  Blood pressure 108/48, pulse 66, temperature 98.7 F (37.1 C), temperature source Oral, resp. rate 30, height 6' (1.829 m), weight 102.059 kg (225 lb), SpO2 100.00%.   IV Fluids:  IV in place, occlusive dsg intact without redness, IV cath antecubital right, condition patent and no redness normal saline.   Allergies:  Pravastatin sodium  Past Medical History:   has a past medical history of Hypertension; Hyperlipidemia; Aortic stenosis; Dermatitis; Cleft palate; Interstitial lung disease; Pulmonary fibrosis; Syncope and collapse (07/18/2014); and GERD (gastroesophageal reflux disease).  Past Surgical History:   has past surgical history that includes bilat cataract sx (2004); Cholecystectomy (1999); 7 surgeries for cleft palate (1938); oral surgery with bone graft from hip for cleft palate (1989); right groin hernia repair (1950's); rotator cuff tear, left shoulder; and aortic valve replacement w/ pericardial tissue valve (10-15-09).   Skin: intact. Dry, flaky and discolored to bilateral upper thighs - pt states "from wet underwear" area is blanching. Barrier cream applied.   Patient/Family orientated to room. Information packet given to patient/family. Admission inpatient armband information verified with patient/family to include name and date of birth and placed on patient arm. Side rails up x 2, fall assessment and education completed with patient/family. Patient/family able to verbalize understanding of risk associated with falls and verbalized understanding to call for  assistance before getting out of bed. Call light within reach. Patient/family able to voice and demonstrate understanding of unit orientation instructions.    Will continue to evaluate and treat per MD orders.  Cathlyn Parsonsattha Tevin Shillingford, RN

## 2013-07-29 NOTE — Evaluation (Signed)
Physical Therapy Evaluation Patient Details Name: Roy BundeRobert G Chan MRN: [id number] DOB: 1934-10-18 Today's Date: 07/29/2013   History of Present Illness  Patient is a 79 yo male admitted 07/28/13 with sepsis, UTI.  Patient with h/o pulm fibrosis on O2 at home, orthostatic hypotension, HTN, AVR, PAF,  Lt shoulder surgery, HOH.  Clinical Impression  Patient presents with problems listed below.  Will benefit from acute PT to maximize independence prior to discharge home with wife.  Patient moving well today, ambulating 180' with no assistive device.  Patient was active prior to recent decline.  Recommend patient return to OP Pulmonary Rehab Program at discharge to improve activity tolerance.    Follow Up Recommendations Supervision - Intermittent;Other (comment) (Recommend patient attend OP Pulmonary Rehab program)    Equipment Recommendations  None recommended by PT    Recommendations for Other Services       Precautions / Restrictions Precautions Precautions: None Precaution Comments: On O2 at home Restrictions Weight Bearing Restrictions: No      Mobility  Bed Mobility               General bed mobility comments: Patient in chair as PT entered room  Transfers Overall transfer level: Needs assistance Equipment used: None Transfers: Sit to/from Stand Sit to Stand: Supervision         General transfer comment: Supervision for safety only.  Good balance in standing  Ambulation/Gait Ambulation/Gait assistance: Supervision Ambulation Distance (Feet): 180 Feet Assistive device: None Gait Pattern/deviations: Step-through pattern Gait velocity: Decreased to manage dyspnea Gait velocity interpretation: Below normal speed for age/gender General Gait Details: Patient with good balance and gait pattern.  Reviewed pursed-lip breathing.  Dyspnea 2/4 with gait with O2 sats at 95%.  Stairs            Wheelchair Mobility    Modified Rankin (Stroke Patients Only)        Balance           Standing balance support: No upper extremity supported Standing balance-Leahy Scale: Good                               Pertinent Vitals/Pain     Home Living Family/patient expects to be discharged to:: Private residence Living Arrangements: Spouse/significant other Available Help at Discharge: Family;Available PRN/intermittently (wife works) Type of Home: House Home Access: Level entry     Home Layout: One level Home Equipment: Cane - single point;Bedside commode      Prior Function Level of Independence: Independent         Comments: Patient has been active, exercising 5 days/week.  Had participated in cardiac and pulmonary rehab programs.     Hand Dominance   Dominant Hand: Right    Extremity/Trunk Assessment   Upper Extremity Assessment: Overall WFL for tasks assessed           Lower Extremity Assessment: Generalized weakness      Cervical / Trunk Assessment: Normal  Communication   Communication: HOH  Cognition Arousal/Alertness: Awake/alert Behavior During Therapy: WFL for tasks assessed/performed (Very talkative today) Overall Cognitive Status: Within Functional Limits for tasks assessed                      General Comments      Exercises        Assessment/Plan    PT Assessment Patient needs continued PT services  PT Diagnosis Difficulty walking;Generalized weakness  PT Problem List Decreased strength;Decreased activity tolerance;Decreased mobility;Cardiopulmonary status limiting activity  PT Treatment Interventions Gait training;Functional mobility training;Therapeutic exercise;Patient/family education   PT Goals (Current goals can be found in the Care Plan section) Acute Rehab PT Goals Patient Stated Goal: To return to prior exercise program at home (5 days/week, walking 30 min, biking 30 min and use of weights). PT Goal Formulation: With patient/family Time For Goal Achievement:  08/05/13 Potential to Achieve Goals: Good    Frequency Min 3X/week   Barriers to discharge Decreased caregiver support Wife works.      Co-evaluation               End of Session Equipment Utilized During Treatment: Gait belt;Oxygen Activity Tolerance: Patient limited by fatigue Patient left: in chair;with call bell/phone within reach;with family/visitor present Nurse Communication: Mobility status         Time: [id number] PT Time Calculation (min): 37 min   Charges:   PT Evaluation $Initial PT Evaluation Tier I: 1 Procedure PT Treatments $Gait Training: 23-37 mins   PT G Codes:          Vena AustriaDavis, Seirra Kos H 07/29/2013, 8:26 PM Durenda HurtSusan H. Renaldo Fiddleravis, PT, [facility] Acute Rehab Services Pager [phone]

## 2013-07-30 LAB — URINE CULTURE: Colony Count: >=100000

## 2013-07-30 LAB — BASIC METABOLIC PANEL
Anion gap: 14 (ref 5–15)
BUN: 21 mg/dL (ref 6–23)
CHLORIDE: 95 meq/L — AB (ref 96–112)
CO2: 25 meq/L (ref 19–32)
CREATININE: 0.95 mg/dL (ref 0.50–1.35)
Calcium: 8.2 mg/dL — ABNORMAL LOW (ref 8.4–10.5)
GFR calc Af Amer: >90 mL/min (ref >90)
GFR calc non Af Amer: 79 mL/min — ABNORMAL LOW (ref >90)
Glucose, Bld: 125 mg/dL — ABNORMAL HIGH (ref 70–99)
POTASSIUM: 4.3 meq/L (ref 3.7–5.3)
Sodium: 134 mEq/L — ABNORMAL LOW (ref 137–147)

## 2013-07-30 LAB — CBC
HEMATOCRIT: 25.2 % — AB (ref 39.0–52.0)
Hemoglobin: 8.4 g/dL — ABNORMAL LOW (ref 13.0–17.0)
MCH: 26.2 pg (ref 26.0–34.0)
MCHC: 33.3 g/dL (ref 30.0–36.0)
MCV: 78.5 fL (ref 78.0–100.0)
Platelets: 122 10*3/uL — ABNORMAL LOW (ref 150–400)
RBC: 3.21 MIL/uL — AB (ref 4.22–5.81)
RDW: 17.8 % — ABNORMAL HIGH (ref 11.5–15.5)
WBC: 19.7 10*3/uL — ABNORMAL HIGH (ref 4.0–10.5)

## 2013-07-30 LAB — PROCALCITONIN: PROCALCITONIN: 0.65 ng/mL

## 2013-07-30 MED ORDER — HYDROCORTISONE NA SUCCINATE PF 100 MG IJ SOLR
50.0000 mg | Freq: Three times a day (TID) | INTRAMUSCULAR | Status: DC
Start: 2013-07-30 — End: 2013-07-31
  Administered 2013-07-30 – 2013-07-31 (×3): 50 mg via INTRAVENOUS
  Filled 2013-07-30 (×6): qty 1

## 2013-07-30 NOTE — Progress Notes (Signed)
Physical Therapy Treatment Patient Details Name: Guss BundeRobert G Gent MRN: [id number] DOB: [birth year] Today's Date: 07/30/2013    History of Present Illness Patient is a 79 yo male admitted 07/28/13 with sepsis, UTI.  Patient with h/o pulm fibrosis on O2 at home, orthostatic hypotension, HTN, AVR, PAF,  Lt shoulder surgery, HOH.    PT Comments    Pt with fatigue on arrival after bathing and standing to perform pericare although wife performed rest of bath. Pt requiring increased supplemental O2 throughout session today with all activity and at rest. Wife and RN aware. Pt encouraged to slowly increase activity within pulmonary function as well as to perform HEP after rested as pt currently deferring for breakfast. Will continue to follow.   Follow Up Recommendations  Supervision - Intermittent;Other (comment)     Equipment Recommendations       Recommendations for Other Services       Precautions / Restrictions Precautions Precautions: Fall Precaution Comments: watch sats    Mobility  Bed Mobility               General bed mobility comments: pt EOB on arrival  Transfers Overall transfer level: Needs assistance   Transfers: Sit to/from Stand Sit to Stand: Supervision         General transfer comment: Supervision for safety only given desaturation and hx of orthostatic hypotension  Ambulation/Gait Ambulation/Gait assistance: Supervision Ambulation Distance (Feet): 180 Feet Assistive device: None Gait Pattern/deviations: Step-through pattern;Decreased stride length Gait velocity: Decreased to manage dyspnea with 6 min rest half way   General Gait Details: Pt with cues for posture and gait speed as well as breathing pattern throughout to manage dyspnea with dyspnea 2-3/4 during gait. Pt with drop to 79% on 4L first half of gait with 6 min standing rest on 6L to recover to 86 but unable to get above 86% with this level maintained on 6L with return to room and very slow  gait. After 2 min seated rest sats back up to 92% and able to decrease O2 to 4L with RN aware and wife monitoring with personal pulse ox   Stairs            Wheelchair Mobility    Modified Rankin (Stroke Patients Only)       Balance Overall balance assessment: No apparent balance deficits (not formally assessed)                                  Cognition Arousal/Alertness: Awake/alert Behavior During Therapy: WFL for tasks assessed/performed Overall Cognitive Status: Within Functional Limits for tasks assessed                      Exercises      General Comments        Pertinent Vitals/Pain See gait    Home Living                      Prior Function            PT Goals (current goals can now be found in the care plan section) Progress towards PT goals: Progressing toward goals (slowly limited by respiratory status)    Frequency       PT Plan Current plan remains appropriate    Co-evaluation             End of Session Equipment Utilized During Treatment: Oxygen  Activity Tolerance: Patient limited by fatigue Patient left: in chair;with call bell/phone within reach;with chair alarm set;with family/visitor present     Time: 0832-0900 PT Time Calculation (min): 28 min  Charges:  $Gait Training: 8-22 mins $Therapeutic Activity: 8-22 mins                    G Codes:      Delorse Lekabor, Kristan Votta Beth 07/30/2013, 9:18 AM Delaney MeigsMaija Tabor Sylis Ketchum, PT [phone]

## 2013-07-30 NOTE — Progress Notes (Signed)
Patient Demographics  Roy MorganRobert Mathurin, is a 79 y.o. male, DOB - 1934/07/25, WUJ:[id number]  Admit date - 07/28/2013   Admitting Physician Ripudeep Jenna LuoK Rai, MD  Outpatient Primary MD for the patient is Laren BoomHommel, Sean, DO  LOS - 2   Chief Complaint  Patient presents with  . Urinary Frequency        Subjective:   Roy Morganobert Dileonardo today has, No headache, No chest pain, No abdominal pain - No Nausea, No new weakness tingling or numbness, No Cough - SOB. Overall feels much better    Assessment & Plan    1. Sepsis secondary to UTI along with Cystitis. Clinically much improved, renal ultrasound rules out hydronephrosis, blood and urine cultures are pending, continue on meropenem as there is a possibility of ESBL organism due to his recent hospitalization. Blood pressures are better and he's not orthostatic, will taper stress dose steroids, he will need Outpatient urology workup for possible BPH.     2. History of interstitial pulmonary fibrosis. With chronic rales on exam, status post 2 L nasal cannula oxygen at home, chronically on prednisone, currently stable from the standpoint, continue oxygen, and nebulizer treatments as needed. Outpatient followup with primary pulmonologist Dr. Delford FieldWright upon discharge.     3. 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 No acute issues.     4. History of paroxysmal atrial fibrillation. Currently on aspirin, as needed IV Lopressor for rate control, not on any rate controlling agents chronically. We'll defer long-term management to primary cardiologist Dr. Jens Somrenshaw.     5. History of orthostatic hypotension with recent episode of syncope and fall requiring hospitalization few weeks ago. Stable, blood pressure better we'll reduce stress dose steroids,  continue home dose ProAmatine, stop IV fluids. Not orthostatic this morning. Continue TED stockings.     6. GERD. On PPI continue.     7. Dyslipidemia. Continue home dose statin.     8. Chronic anemia. Stable.    9. High potassium. Resolved after One dose of Kayexalate.    10. Possible bladder mass on ultrasound and confirmed on CT scan along with enlarged prostate. Outpatient urology followup.    11. 3 cm abdominal aneurysm. We'll have him follow with vascular surgery on a close basis.    12. Leukocytosis - really due to #1 above however now on steroids as well. Afebrile. Clinically improved.      Code Status: Full  Family Communication: Wife bedside  Disposition Plan: Home   Procedures renal ultrasound, CT scan abdomen and pelvis with contrast   Consults      Medications  Scheduled Meds: . aspirin EC  81 mg Oral Daily  . docusate sodium  200 mg Oral BID  . heparin  5,000 Units Subcutaneous 3 times per day  . hydrocortisone sod succinate (SOLU-CORTEF) inj  50 mg Intravenous Q8H  . loratadine  10 mg Oral Daily  . meropenem (MERREM) IV  1 g Intravenous 3 times per day  . midodrine  2.5 mg Oral TID WC  . pantoprazole  40 mg Oral Daily  . simvastatin  10 mg Oral q1800  . sodium chloride  3 mL Intravenous Q12H   Continuous Infusions:   PRN Meds:.guaiFENesin-dextromethorphan, HYDROcodone-acetaminophen, metoprolol, ondansetron (ZOFRAN) IV,  polyethylene glycol, sodium chloride  DVT Prophylaxis   Heparin    Lab Results  Component Value Date   PLT 122* 07/30/2013    Antibiotics    Anti-infectives   Start     Dose/Rate Route Frequency Ordered Stop   07/29/13 1100  cefTRIAXone (ROCEPHIN) 1 g in dextrose 5 % 50 mL IVPB  Status:  Discontinued     1 g 100 mL/hr over 30 Minutes Intravenous Every 24 hours 07/28/13 1718 07/29/13 0624   07/29/13 0700  meropenem (MERREM) 1 g in sodium chloride 0.9 % 100 mL IVPB     1 g 200 mL/hr over 30 Minutes Intravenous  3 times per day 07/29/13 0652     07/28/13 1100  cefTRIAXone (ROCEPHIN) 1 g in dextrose 5 % 50 mL IVPB     1 g 100 mL/hr over 30 Minutes Intravenous  Once 07/28/13 1047 07/28/13 1141   07/28/13 1054  cefTRIAXone (ROCEPHIN) 1 G injection    Comments:  Karl Luke   : cabinet override      07/28/13 1054 07/28/13 1108          Objective:   Filed Vitals:   07/29/13 1528 07/29/13 2017 07/30/13 0443 07/30/13 0500  BP: 142/70 134/69 123/60   Pulse: 100 89 80   Temp:  98.1 F (36.7 C) 97.6 F (36.4 C)   TempSrc:  Oral Oral   Resp:  20 20   Height:      Weight:    102.059 kg (225 lb)  SpO2: 95% 97% 94%     Wt Readings from Last 3 Encounters:  07/30/13 102.059 kg (225 lb)  07/23/13 102.059 kg (225 lb)  07/18/13 102.694 kg (226 lb 6.4 oz)     Intake/Output Summary (Last 24 hours) at 07/30/13 0757 Last data filed at 07/30/13 0046  Gross per 24 hour  Intake 486.67 ml  Output    300 ml  Net 186.67 ml     Physical Exam  Awake Alert, Oriented X 3, No new F.N deficits, Normal affect [place].AT,PERRAL Supple Neck,No JVD, No cervical lymphadenopathy appriciated.  Symmetrical Chest wall movement, Good air movement bilaterally, CTAB RRR,No Gallops,Rubs or new Murmurs, No Parasternal Heave +ve B.Sounds, Abd Soft, No tenderness, No organomegaly appriciated, No rebound - guarding or rigidity. No Cyanosis, Clubbing or edema, No new Rash or bruise      Data Review   Micro Results Recent Results (from the past 240 hour(s))  URINE CULTURE     Status: None   Collection Time    07/28/13  9:20 AM      Result Value Ref Range Status   Specimen Description URINE, CLEAN CATCH   Final   Special Requests NONE   Final   Culture  Setup Time     Final   Value: 07/28/2013 21:40     Performed at [facility] Count     Final   Value: >=100,000 COLONIES/ML     Performed at [facility]   Culture     Final   Value: GRAM NEGATIVE RODS     Performed at [facility]   Report Status PENDING   Incomplete  CULTURE, BLOOD (ROUTINE X 2)     Status: None   Collection Time    07/28/13 12:40 PM      Result Value Ref Range Status   Specimen Description BLOOD RIGHT ANTECUBITAL   Final   Special Requests BOTTLES DRAWN AEROBIC AND ANAEROBIC  6 ML   Final   Culture  Setup Time     Final   Value: 07/28/2013 19:48     Performed at [facility]   Culture     Final   Value:        BLOOD CULTURE RECEIVED NO GROWTH TO DATE CULTURE WILL BE HELD FOR 5 DAYS BEFORE ISSUING A FINAL NEGATIVE REPORT     Performed at [facility]   Report Status PENDING   Incomplete  MRSA PCR SCREENING     Status: None   Collection Time    07/28/13  6:19 PM      Result Value Ref Range Status   MRSA by PCR NEGATIVE  NEGATIVE Final   Comment:            The GeneXpert MRSA Assay (FDA     approved for NASAL specimens     only), is one component of a     comprehensive MRSA colonization     surveillance program. It is not     intended to diagnose MRSA     infection nor to guide or     monitor treatment for     MRSA infections.    Radiology Reports Dg Chest 2 View  07/17/2013   CLINICAL DATA:  Loss of consciousness. Weakness for 1 day. Aortic valve replacement in 2011.  EXAM: CHEST  2 VIEW  COMPARISON:  07/08/2013.  FINDINGS: Median sternotomy and bioprosthetic aortic valve. Cholecystectomy clips are present in the right upper quadrant. Diffuse interstitial opacity is present compatible with pulmonary fibrosis demonstrated on prior CT. No definite superimposed airspace opacity. The cardiopericardial silhouette is within normal limits for projection. Mediastinal contours are unchanged. Unchanged rightward tracheal deviation. Chronic right AC joint separation.  IMPRESSION: Chronic interstitial lung disease without acute cardiopulmonary disease.   Electronically Signed   By: Andreas NewportGeoffrey  Lamke M.D.   On: 07/17/2013 07:24   Dg Chest 2 View  07/08/2013   CLINICAL DATA:   Productive cough.  EXAM: CHEST  2 VIEW  COMPARISON:  08/03/2012 and CT 05/2012  FINDINGS: Two views of the chest were obtained. Patient has known interstitial lung disease based on the previous CT. Again noted are prominent interstitial lung markings with some volume loss in the right lung. Retrocardiac density is compatible with a large hiatal hernia. Heart size is stable and upper limits of normal. Median sternotomy wires are noted. Previous aortic valve surgery.  IMPRESSION: Diffuse interstitial lung densities likely represent chronic changes. Difficult to exclude mild edema. Minimal change since the previous examination.   Electronically Signed   By: Richarda OverlieAdam  Henn M.D.   On: 07/08/2013 08:26   [place] Renal  07/28/2013   CLINICAL DATA:  Acute renal failure  EXAM: RENAL/URINARY TRACT ULTRASOUND COMPLETE  COMPARISON:  None.  FINDINGS: Right Kidney:  Length: 10.4 cm. Echogenicity is within normal limits. No hydronephrosis. 1.5 cm hypoechoic area in the upper pole, likely small cyst.  Left Kidney:  Length: 11.1 cm. Echogenicity within normal limits. No mass or hydronephrosis visualized.  Bladder:  Abnormal soft tissue noted along the posterior bladder wall. This does not significantly change when placing the patient in the right lateral decubitus position. There appears to be some internal blood flow. Cannot exclude bladder wall lesion/mass.  IMPRESSION: No evidence of hydronephrosis.  Question posterior soft tissue bladder wall mass. Recommend further evaluation with CT of the abdomen and pelvis without and with contrast, to include delayed prone images through the bladder.  Electronically Signed   By: Charlett Nose M.D.   On: 07/28/2013 19:33    CBC  Recent Labs Lab 07/28/13 1240 07/28/13 1840 07/29/13 0314 07/30/13 0429  WBC 13.2* 13.5* 18.4* 19.7*  HGB 9.1* 8.6* 9.3* 8.4*  HCT 27.0* 25.7* 29.5* 25.2*  PLT 151 140* 112* 122*  MCV 80.8 79.3 84.5 78.5  MCH 27.2 26.5 26.6 26.2  MCHC 33.7 33.5 31.5 33.3    RDW 17.8* 17.7* 17.9* 17.8*  LYMPHSABS 0.9  --   --   --   MONOABS 0.9  --   --   --   EOSABS 0.0  --   --   --   BASOSABS 0.0  --   --   --     Chemistries   Recent Labs Lab 07/28/13 1240 07/28/13 1840 07/29/13 0314 07/29/13 1208 07/30/13 0429  NA 135*  --  136*  --  134*  K 4.9  --  5.5* 4.6 4.3  CL 98  --  98  --  95*  CO2 25  --  25  --  25  GLUCOSE 109*  --  137*  --  125*  BUN 16  --  17  --  21  CREATININE 1.00 1.05 1.03  --  0.95  CALCIUM 9.1  --  8.7  --  8.2*  AST 28  --   --   --   --   ALT 23  --   --   --   --   ALKPHOS 54  --   --   --   --   BILITOT 0.8  --   --   --   --    ------------------------------------------------------------------------------------------------------------------ estimated creatinine clearance is 81.8 ml/min (by C-G formula based on Cr of 0.95). ------------------------------------------------------------------------------------------------------------------ No results found for this basename: HGBA1C,  in the last 72 hours ------------------------------------------------------------------------------------------------------------------ No results found for this basename: CHOL, HDL, LDLCALC, TRIG, CHOLHDL, LDLDIRECT,  in the last 72 hours ------------------------------------------------------------------------------------------------------------------ No results found for this basename: TSH, T4TOTAL, FREET3, T3FREE, THYROIDAB,  in the last 72 hours ------------------------------------------------------------------------------------------------------------------ No results found for this basename: VITAMINB12, FOLATE, FERRITIN, TIBC, IRON, RETICCTPCT,  in the last 72 hours  Coagulation profile  Recent Labs Lab 07/28/13 1840  INR 1.18    No results found for this basename: DDIMER,  in the last 72 hours  Cardiac Enzymes No results found for this basename: CK, CKMB, TROPONINI, MYOGLOBIN,  in the last 168  hours ------------------------------------------------------------------------------------------------------------------ No components found with this basename: POCBNP,      Time Spent in minutes 35   SINGH,PRASHANT K M.D on 07/30/2013 at 7:57 AM  Between 7am to 7pm - Pager - [phone]  After 7pm go to [email or web address] - password [email or web address]  And look for the night coverage person covering for me after hours  [facility] Office  [phone]   **Disclaimer: This note may have been dictated with voice recognition software. Similar sounding words can inadvertently be transcribed and this note may contain transcription errors which may not have been corrected upon publication of note.**

## 2013-07-31 LAB — CBC
HCT: 25.8 % — ABNORMAL LOW (ref 39.0–52.0)
HEMOGLOBIN: 8.8 g/dL — AB (ref 13.0–17.0)
MCH: 26.7 pg (ref 26.0–34.0)
MCHC: 34.1 g/dL (ref 30.0–36.0)
MCV: 78.4 fL (ref 78.0–100.0)
Platelets: 126 10*3/uL — ABNORMAL LOW (ref 150–400)
RBC: 3.29 MIL/uL — ABNORMAL LOW (ref 4.22–5.81)
RDW: 17.8 % — ABNORMAL HIGH (ref 11.5–15.5)
WBC: 14.3 10*3/uL — ABNORMAL HIGH (ref 4.0–10.5)

## 2013-07-31 MED ORDER — MIDODRINE HCL 2.5 MG PO TABS
2.5000 mg | ORAL_TABLET | Freq: Three times a day (TID) | ORAL | Status: DC
  Administered 2013-07-31 – 2013-08-01 (×5): 2.5 mg via ORAL
  Filled 2013-07-31 (×7): qty 1

## 2013-07-31 MED ORDER — FUROSEMIDE 10 MG/ML IJ SOLN
20.0000 mg | Freq: Once | INTRAMUSCULAR | Status: AC
  Administered 2013-07-31: 20 mg via INTRAVENOUS
  Filled 2013-07-31: qty 2

## 2013-07-31 MED ORDER — LEVOFLOXACIN 750 MG PO TABS
750.0000 mg | ORAL_TABLET | Freq: Every day | ORAL | Status: DC
Start: 2013-07-31 — End: 2013-08-01
  Administered 2013-07-31 – 2013-08-01 (×2): 750 mg via ORAL
  Filled 2013-07-31 (×2): qty 1

## 2013-07-31 MED ORDER — PREDNISONE 20 MG PO TABS
30.0000 mg | ORAL_TABLET | Freq: Every day | ORAL | Status: DC
  Administered 2013-07-31 – 2013-08-01 (×2): 30 mg via ORAL
  Filled 2013-07-31 (×3): qty 1

## 2013-07-31 NOTE — Plan of Care (Signed)
Problem: Phase III Progression Outcomes Goal: Voiding independently Outcome: Completed/Met Date Met:  07/31/13 Uses the urinal.

## 2013-07-31 NOTE — ED Provider Notes (Signed)
Medical screening examination/treatment/procedure(s) were conducted as a shared visit with non-physician practitioner(s) and myself.  I personally evaluated the patient during the encounter.   EKG Interpretation   Date/Time:  Tuesday July 17 2013 06:35:02 EDT Ventricular Rate:  77 PR Interval:  240 QRS Duration: 96 QT Interval:  390 QTC Calculation: 441 R Axis:   62 Text Interpretation:  Sinus rhythm Prolonged PR interval Inferior infarct,  age indeterminate Similar to prior Confirmed by [facility]  MD, Mckensi Redinger [phone]  on 07/17/2013 7:09:27 AM      Patient here with syncope - orthostatic on arrival. Hx of pulmonary fibrosis and multiple other medical problems. Labs with a few abnormalities, nothing requiring urgent dialysis or central line for fluids.   Dagmar HaitWilliam Nemiah Bubar, MD 07/31/13 [phone]

## 2013-07-31 NOTE — Progress Notes (Signed)
ANTIBIOTIC CONSULT NOTE   Pharmacy Consult for levaquin Indication: citrobacter UTI  Allergies  Allergen Reactions  . Pravastatin Sodium Other (See Comments)    Sneezing episodes, can take medication and tolerates reaction.    Patient Measurements: Height: 6' (182.9 cm) Weight: 227 lb 11.8 oz (103.3 kg) IBW/kg (Calculated) : 77.6  Vital Signs: Temp: 98 F (36.7 C) (07/07 0507) Temp src: Oral (07/07 0507) BP: 120/65 mmHg (07/07 0507) Pulse Rate: 78 (07/07 0507) Intake/Output from previous day: 07/06 0701 - 07/07 0700 In: 480 [P.O.:480] Out: 500 [Urine:500] Intake/Output from this shift: Total I/O In: 410 [P.O.:410] Out: 300 [Urine:300]  Labs:  Recent Labs  07/28/13 1840 07/29/13 0314 07/30/13 0429 07/31/13 0835  WBC 13.5* 18.4* 19.7* 14.3*  HGB 8.6* 9.3* 8.4* 8.8*  PLT 140* 112* 122* 126*  CREATININE 1.05 1.03 0.95  --    Estimated Creatinine Clearance: 82.2 ml/min (by C-G formula based on Cr of 0.95).   Microbiology: Recent Results (from the past 720 hour(s))  URINE CULTURE     Status: None   Collection Time    07/17/13  9:00 AM      Result Value Ref Range Status   Specimen Description URINE, RANDOM   Final   Special Requests NONE   Final   Culture  Setup Time     Final   Value: 07/17/2013 09:54     Performed at [facility]   Colony Count     Final   Value: NO GROWTH     Performed at [facility]   Culture     Final   Value: NO GROWTH     Performed at [facility]   Report Status 07/18/2013 FINAL   Final  URINE CULTURE     Status: None   Collection Time    07/28/13  9:20 AM      Result Value Ref Range Status   Specimen Description URINE, CLEAN CATCH   Final   Special Requests NONE   Final   Culture  Setup Time     Final   Value: 07/28/2013 21:40     Performed at [facility]   Colony Count     Final   Value: >=100,000 COLONIES/ML     Performed at [facility]   Culture     Final   Value: CITROBACTER  FREUNDII     Performed at [facility]   Report Status 07/30/2013 FINAL   Final   Organism ID, Bacteria CITROBACTER FREUNDII   Final  CULTURE, BLOOD (ROUTINE X 2)     Status: None   Collection Time    07/28/13 12:40 PM      Result Value Ref Range Status   Specimen Description BLOOD RIGHT ANTECUBITAL   Final   Special Requests BOTTLES DRAWN AEROBIC AND ANAEROBIC 6 ML   Final   Culture  Setup Time     Final   Value: 07/28/2013 19:48     Performed at [facility]   Culture     Final   Value:        BLOOD CULTURE RECEIVED NO GROWTH TO DATE CULTURE WILL BE HELD FOR 5 DAYS BEFORE ISSUING A FINAL NEGATIVE REPORT     Performed at [facility]   Report Status PENDING   Incomplete  MRSA PCR SCREENING     Status: None   Collection Time    07/28/13  6:19 PM      Result Value  Ref Range Status   MRSA by PCR NEGATIVE  NEGATIVE Final   Comment:            The GeneXpert MRSA Assay (FDA     approved for NASAL specimens     only), is one component of a     comprehensive MRSA colonization     surveillance program. It is not     intended to diagnose MRSA     infection nor to guide or     monitor treatment for     MRSA infections.    Medical History: Past Medical History  Diagnosis Date  . Hypertension   . Hyperlipidemia   . Aortic stenosis     AVR 2011  . Dermatitis     poison ivy  . Cleft palate   . Interstitial lung disease   . Pulmonary fibrosis   . Syncope and collapse 07/18/2014  . GERD (gastroesophageal reflux disease)     Medications:  Prescriptions prior to admission  Medication Sig Dispense Refill  . aspirin 81 MG tablet Take 81 mg by mouth every morning.       . benzonatate (TESSALON PERLES) 100 MG capsule Take 1 capsule (100 mg total) by mouth 3 (three) times daily as needed for cough.  20 capsule  0  . ferrous sulfate 325 (65 FE) MG EC tablet Take 1 tablet (325 mg total) by mouth 3 (three) times daily with meals.    3  . loratadine (CLARITIN) 10  MG tablet Take 10 mg by mouth every morning.       . midodrine (PROAMATINE) 2.5 MG tablet Take 1 tablet (2.5 mg total) by mouth 3 (three) times daily with meals.  90 tablet  1  . Multiple Vitamin (MULTIVITAMIN WITH MINERALS) TABS tablet Take 1 tablet by mouth every morning.       . neomycin-bacitracin-polymyxin (NEOSPORIN) OINT Apply 1 application topically as needed for irritation or wound care.      Marland Kitchen. OVER THE COUNTER MEDICATION Super beta prostate - take 1 tablet by mouth twice daily      . pantoprazole (PROTONIX) 40 MG tablet Take 40 mg by mouth every morning.       . pravastatin (PRAVACHOL) 40 MG tablet Take 40 mg by mouth every evening.       . predniSONE (DELTASONE) 10 MG tablet Take 20 mg by mouth daily with breakfast.      . SALINE NASAL MIST NA Place 1 spray into the nose daily as needed (for congestion).       Scheduled:  . aspirin EC  81 mg Oral Daily  . docusate sodium  200 mg Oral BID  . furosemide  20 mg Intravenous Once  . heparin  5,000 Units Subcutaneous 3 times per day  . levofloxacin  750 mg Oral Daily  . loratadine  10 mg Oral Daily  . midodrine  2.5 mg Oral TID WC  . pantoprazole  40 mg Oral Daily  . predniSONE  30 mg Oral Q breakfast  . simvastatin  10 mg Oral q1800  . sodium chloride  3 mL Intravenous Q12H       Assessment: 79yo male c/o urinary frequency associated w/ burning and hesitancy, found to be febrile and hypotensive resistant to fluids. Merrem initially started now change to po levaquin for Citrobacter UTI  Plan:  Levaquin 750 mg po daily  Talbert CageLora Erum Cercone, PharmD 07/31/2013,11:24 AM

## 2013-07-31 NOTE — Care Management Note (Addendum)
    Page 1 of 2   08/01/2013     1:54:56 PM CARE MANAGEMENT NOTE 08/01/2013  Patient:  Guss BundeSHERRELL,Leor G   Account Number:  [id number]  Date Initiated:  07/30/2013  Documentation initiated by:  [facility] LPHAVIS,ALESIA  Subjective/Objective Assessment:   Sepsis     Action/Plan:   Anticipated DC Date:  08/01/2013   Anticipated DC Plan:  HOME W HOME HEALTH SERVICES      DC Planning Services  CM consult      [facility]   Choice offered to / List presented to:  C-1 Patient   DME arranged  OTHER - SEE COMMENT      DME agency  [facility]     HH arranged  HH-1 RN  HH-2 PT      [facility]   Status of service:  Completed, signed off Medicare Important Message given?  YES (If response is "NO", the following Medicare IM given date fields will be blank) Date Medicare IM given:  07/31/2013 Medicare IM given by:  Letha CapeAYLOR,Inman Fettig Date Additional Medicare IM given:   Additional Medicare IM given by:    Discharge Disposition:  HOME W HOME HEALTH SERVICES  Per UR Regulation:  Reviewed for med. necessity/level of care/duration of stay  If discussed at Long Length of Stay Meetings, dates discussed:    Comments:  7/88/15 1352 Letha Capeeborah Bryli Mantey RN, BSN [phone] physical therapist states patient will benefit from a rollator,  will give patient order for rollator, demo sheet and physical therapy note to [facility] store to pick up because they do not have any downstairs.  07/31/13 1146 Letha Capeeborah Kadia Abaya RN, BSN [phone] NCM spoke with patient and wife, they would like to have HHRN and HHPT until patient is able to go to Pulmonary rehab.  [facility], [facility], [facility].  Soc will begin 24-48 hrs post dc.  Patient has home oxygen with [facility], wife has oxygen in the car for patient.

## 2013-07-31 NOTE — Progress Notes (Signed)
Patient Demographics  [name], is a 79 y.o. male, DOB - 28-Dec-1934, [name]:[id number]  Admit date - 07/28/2013   Admitting Physician Ripudeep Jenna LuoK Rai, MD  Outpatient Primary MD for the patient is Laren BoomHommel, Sean, DO  LOS - 3   Chief Complaint  Patient presents with  . Urinary Frequency        Subjective:   [name] today has, No headache, No chest pain, No abdominal pain - No Nausea, No new weakness tingling or numbness, No Cough - SOB. Overall feels much better    Assessment & Plan    1. Sepsis secondary to UTI along with Cystitis. Clinically much improved, renal ultrasound rules out hydronephrosis, blood and urine cultures are pending, switched to Levaquin based on culture and sensitivity data. Blood pressures are better and he's not orthostatic, will taper stress dose steroids, he will need Outpatient urology workup for possible BPH.     2. History of interstitial pulmonary fibrosis. With chronic rales on exam, status post 2 L nasal cannula oxygen at home, chronically on prednisone, currently stable from the standpoint, continue oxygen, and nebulizer treatments as needed. Outpatient followup with primary pulmonologist Dr. Delford FieldWright upon discharge. He has mild fluid overload due to IV fluids which was required when he was septic and hypotensive, gentle Lasix and monitor.    3. 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 No acute issues.     4. History of paroxysmal atrial fibrillation. Currently on aspirin, as needed IV Lopressor for rate control, not on any rate controlling agents chronically. We'll defer long-term management to primary cardiologist Dr. Jens Somrenshaw.     5. History of orthostatic hypotension with recent episode of syncope and fall requiring hospitalization  few weeks ago. Stable, blood pressure better we'll reduce stress dose steroids, continue home dose ProAmatine, stop IV fluids. Not orthostatic this morning. Continue TED stockings.     6. GERD. On PPI continue.     7. Dyslipidemia. Continue home dose statin.     8. Chronic anemia. Stable.    9. High potassium. Resolved after One dose of Kayexalate.    10. Possible bladder mass on ultrasound and confirmed on CT scan along with enlarged prostate. Outpatient urology followup.    11. 3 cm abdominal aneurysm. We'll have him follow with vascular surgery on a close basis.    12. Leukocytosis - due to #1 above along with steroid use. Trend stable.      Code Status: Full  Family Communication: Wife bedside  Disposition Plan: Home   Procedures renal ultrasound, CT scan abdomen and pelvis with contrast   Consults      Medications  Scheduled Meds: . aspirin EC  81 mg Oral Daily  . docusate sodium  200 mg Oral BID  . furosemide  20 mg Intravenous Once  . heparin  5,000 Units Subcutaneous 3 times per day  . levofloxacin  750 mg Oral Daily  . loratadine  10 mg Oral Daily  . midodrine  2.5 mg Oral TID WC  . pantoprazole  40 mg Oral Daily  . predniSONE  30 mg Oral Q breakfast  . simvastatin  10 mg Oral q1800  . sodium chloride  3 mL Intravenous Q12H   Continuous  Infusions:   PRN Meds:.guaiFENesin-dextromethorphan, HYDROcodone-acetaminophen, metoprolol, ondansetron (ZOFRAN) IV, polyethylene glycol, sodium chloride  DVT Prophylaxis   Heparin    Lab Results  Component Value Date   PLT PENDING 07/31/2013    Antibiotics    Anti-infectives   Start     Dose/Rate Route Frequency Ordered Stop   07/31/13 0730  levofloxacin (LEVAQUIN) tablet 750 mg     750 mg Oral Daily 07/31/13 0717     07/29/13 1100  cefTRIAXone (ROCEPHIN) 1 g in dextrose 5 % 50 mL IVPB  Status:  Discontinued     1 g 100 mL/hr over 30 Minutes Intravenous Every 24 hours 07/28/13 1718 07/29/13 0624     07/29/13 0700  meropenem (MERREM) 1 g in sodium chloride 0.9 % 100 mL IVPB  Status:  Discontinued     1 g 200 mL/hr over 30 Minutes Intravenous 3 times per day 07/29/13 0652 07/30/13 1304   07/28/13 1100  cefTRIAXone (ROCEPHIN) 1 g in dextrose 5 % 50 mL IVPB     1 g 100 mL/hr over 30 Minutes Intravenous  Once 07/28/13 1047 07/28/13 1141   07/28/13 1054  cefTRIAXone (ROCEPHIN) 1 G injection    Comments:  Karl Luke   : cabinet override      07/28/13 1054 07/28/13 1108          Objective:   Filed Vitals:   07/30/13 0913 07/30/13 1456 07/30/13 2048 07/31/13 0507  BP:  116/62 148/71 120/65  Pulse: 93 85 86 78  Temp:  97.9 F (36.6 C) 98 F (36.7 C) 98 F (36.7 C)  TempSrc:  Oral Oral Oral  Resp:  20 20 18   Height:      Weight:    103.3 kg (227 lb 11.8 oz)  SpO2: 92% 96% 94% 96%    Wt Readings from Last 3 Encounters:  07/31/13 103.3 kg (227 lb 11.8 oz)  07/23/13 102.059 kg (225 lb)  07/18/13 102.694 kg (226 lb 6.4 oz)     Intake/Output Summary (Last 24 hours) at 07/31/13 1012 Last data filed at 07/31/13 0931  Gross per 24 hour  Intake    890 ml  Output    800 ml  Net     90 ml     Physical Exam  Awake Alert, Oriented X 3, No new F.N deficits, Normal affect [place].AT,PERRAL Supple Neck,No JVD, No cervical lymphadenopathy appriciated.  Symmetrical Chest wall movement, Good air movement bilaterally, CTAB RRR,No Gallops,Rubs or new Murmurs, No Parasternal Heave +ve B.Sounds, Abd Soft, No tenderness, No organomegaly appriciated, No rebound - guarding or rigidity. No Cyanosis, Clubbing or edema, No new Rash or bruise      Data Review   Micro Results Recent Results (from the past 240 hour(s))  URINE CULTURE     Status: None   Collection Time    07/28/13  9:20 AM      Result Value Ref Range Status   Specimen Description URINE, CLEAN CATCH   Final   Special Requests NONE   Final   Culture  Setup Time     Final   Value: 07/28/2013 21:40     Performed at  [facility] Count     Final   Value: >=100,000 COLONIES/ML     Performed at [facility]   Culture     Final   Value: CITROBACTER FREUNDII     Performed at [facility]   Report Status 07/30/2013 FINAL   Final  Organism ID, Bacteria CITROBACTER FREUNDII   Final  CULTURE, BLOOD (ROUTINE X 2)     Status: None   Collection Time    07/28/13 12:40 PM      Result Value Ref Range Status   Specimen Description BLOOD RIGHT ANTECUBITAL   Final   Special Requests BOTTLES DRAWN AEROBIC AND ANAEROBIC 6 ML   Final   Culture  Setup Time     Final   Value: 07/28/2013 19:48     [facility]formed [facility]   Culture     Final   Value:        BLOOD CULTURE RECEIVED NO GROWTH TO DATE CULTURE WILL BE HELD FOR 5 DAYS BEFORE ISSUING A FINAL NEGATIVE REPORT     Performed at [facility]   Report Status PENDING   Incomplete  MRSA PCR SCREENING     Status: None   Collection Time    07/28/13  6:19 PM      Result Value Ref Range Status   MRSA by PCR NEGATIVE  NEGATIVE Final   Comment:            The GeneXpert MRSA Assay (FDA     approved for NASAL specimens     only), is one component of a     comprehensive MRSA colonization     surveillance program. It is not     intended to diagnose MRSA     infection nor to guide or     monitor treatment for     MRSA infections.    Radiology Reports Dg Chest 2 View  07/17/2013   CLINICAL DATA:  Loss of consciousness. Weakness for 1 day. Aortic valve replacement in 2011.  EXAM: CHEST  2 VIEW  COMPARISON:  07/08/2013.  FINDINGS: Median sternotomy and bioprosthetic aortic valve. Cholecystectomy clips are present in the right upper quadrant. Diffuse interstitial opacity is present compatible with pulmonary fibrosis demonstrated on prior CT. No definite superimposed airspace opacity. The cardiopericardial silhouette is within normal limits for projection. Mediastinal contours are unchanged. Unchanged rightward tracheal  deviation. Chronic right AC joint separation.  IMPRESSION: Chronic interstitial lung disease without acute cardiopulmonary disease.   Electronically Signed   By: Andreas NewportGeoffrey  Lamke M.D.   On: 07/17/2013 07:24   Dg Chest 2 View  07/08/2013   CLINICAL DATA:  Productive cough.  EXAM: CHEST  2 VIEW  COMPARISON:  08/03/2012 and CT 05/2012  FINDINGS: Two views of the chest were obtained. Patient has known interstitial lung disease based on the previous CT. Again noted are prominent interstitial lung markings with some volume loss in the right lung. Retrocardiac density is compatible with a large hiatal hernia. Heart size is stable and upper limits of normal. Median sternotomy wires are noted. Previous aortic valve surgery.  IMPRESSION: Diffuse interstitial lung densities likely represent chronic changes. Difficult to exclude mild edema. Minimal change since the previous examination.   Electronically Signed   By: Richarda OverlieAdam  Henn M.D.   On: 07/08/2013 08:26   [place] Renal  07/28/2013   CLINICAL DATA:  Acute renal failure  EXAM: RENAL/URINARY TRACT ULTRASOUND COMPLETE  COMPARISON:  None.  FINDINGS: Right Kidney:  Length: 10.4 cm. Echogenicity is within normal limits. No hydronephrosis. 1.5 cm hypoechoic area in the upper pole, likely small cyst.  Left Kidney:  Length: 11.1 cm. Echogenicity within normal limits. No mass or hydronephrosis visualized.  Bladder:  Abnormal soft tissue noted along the posterior bladder wall. This does not significantly change when  placing the patient in the right lateral decubitus position. There appears to be some internal blood flow. Cannot exclude bladder wall lesion/mass.  IMPRESSION: No evidence of hydronephrosis.  Question posterior soft tissue bladder wall mass. Recommend further evaluation with CT of the abdomen and pelvis without and with contrast, to include delayed prone images through the bladder.   Electronically Signed   By: Charlett Nose M.D.   On: 07/28/2013 19:33    CBC  Recent  Labs Lab 07/28/13 1240 07/28/13 1840 07/29/13 0314 07/30/13 0429 07/31/13 0835  WBC 13.2* 13.5* 18.4* 19.7* 14.3*  HGB 9.1* 8.6* 9.3* 8.4* 8.8*  HCT 27.0* 25.7* 29.5* 25.2* 25.8*  PLT 151 140* 112* 122* PENDING  MCV 80.8 79.3 84.5 78.5 78.4  MCH 27.2 26.5 26.6 26.2 26.7  MCHC 33.7 33.5 31.5 33.3 34.1  RDW 17.8* 17.7* 17.9* 17.8* 17.8*  LYMPHSABS 0.9  --   --   --   --   MONOABS 0.9  --   --   --   --   EOSABS 0.0  --   --   --   --   BASOSABS 0.0  --   --   --   --     Chemistries   Recent Labs Lab 07/28/13 1240 07/28/13 1840 07/29/13 0314 07/29/13 1208 07/30/13 0429  NA 135*  --  136*  --  134*  K 4.9  --  5.5* 4.6 4.3  CL 98  --  98  --  95*  CO2 25  --  25  --  25  GLUCOSE 109*  --  137*  --  125*  BUN 16  --  17  --  21  CREATININE 1.00 1.05 1.03  --  0.95  CALCIUM 9.1  --  8.7  --  8.2*  AST 28  --   --   --   --   ALT 23  --   --   --   --   ALKPHOS 54  --   --   --   --   BILITOT 0.8  --   --   --   --    ------------------------------------------------------------------------------------------------------------------ estimated creatinine clearance is 82.2 ml/min (by C-G formula based on Cr of 0.95). ------------------------------------------------------------------------------------------------------------------ No results found for this basename: HGBA1C,  in the last 72 hours ------------------------------------------------------------------------------------------------------------------ No results found for this basename: CHOL, HDL, LDLCALC, TRIG, CHOLHDL, LDLDIRECT,  in the last 72 hours ------------------------------------------------------------------------------------------------------------------ No results found for this basename: TSH, T4TOTAL, FREET3, T3FREE, THYROIDAB,  in the last 72 hours ------------------------------------------------------------------------------------------------------------------ No results found for this basename:  VITAMINB12, FOLATE, FERRITIN, TIBC, IRON, RETICCTPCT,  in the last 72 hours  Coagulation profile  Recent Labs Lab 07/28/13 1840  INR 1.18    No results found for this basename: DDIMER,  in the last 72 hours  Cardiac Enzymes No results found for this basename: CK, CKMB, TROPONINI, MYOGLOBIN,  in the last 168 hours ------------------------------------------------------------------------------------------------------------------ No components found with this basename: POCBNP,      Time Spent in minutes 35   SINGH,PRASHANT K M.D on 07/31/2013 at 10:12 AM  Between 7am to 7pm - Pager - [phone]  After 7pm go to [email or web address] - password [email or web address]  And look for the night coverage person covering for me after hours  [facility] Office  [phone]   **Disclaimer: This note may have been dictated with voice recognition software. Similar sounding words can inadvertently be transcribed and this note may contain transcription errors  which may not have been corrected upon publication of note.**

## 2013-08-01 ENCOUNTER — Telehealth: Payer: Self-pay | Admitting: Family Medicine

## 2013-08-01 ENCOUNTER — Encounter: Payer: Self-pay | Admitting: Family Medicine

## 2013-08-01 DIAGNOSIS — I714 Abdominal aortic aneurysm, without rupture, unspecified: Secondary | ICD-10-CM

## 2013-08-01 DIAGNOSIS — N3289 Other specified disorders of bladder: Secondary | ICD-10-CM | POA: Insufficient documentation

## 2013-08-01 LAB — POTASSIUM: POTASSIUM: 3.5 meq/L — AB (ref 3.7–5.3)

## 2013-08-01 MED ORDER — POTASSIUM CHLORIDE CRYS ER 20 MEQ PO TBCR
40.0000 meq | EXTENDED_RELEASE_TABLET | Freq: Once | ORAL | Status: AC
  Administered 2013-08-01: 40 meq via ORAL
  Filled 2013-08-01: qty 2

## 2013-08-01 MED ORDER — PREDNISONE 10 MG PO TABS: 20.0000 mg | ORAL_TABLET | Freq: Every day | ORAL | Status: DC

## 2013-08-01 MED ORDER — LEVOFLOXACIN 750 MG PO TABS: 750.0000 mg | ORAL_TABLET | Freq: Every day | ORAL | Status: DC

## 2013-08-01 MED ORDER — SACCHAROMYCES BOULARDII 250 MG PO CAPS: 250.0000 mg | ORAL_CAPSULE | Freq: Two times a day (BID) | ORAL | Status: DC

## 2013-08-01 NOTE — Telephone Encounter (Signed)
[name], Will you please let patient or his wife know that his hospitalist recommended he follow up with a vascular specialist and urologist as an outpatient.  I've placed a referral for both of these specialists.  Please call me if not contacted by either office by the end of the week.

## 2013-08-01 NOTE — Discharge Summary (Signed)
[name], is a 79 y.o. male  DOB Mar 21, 1934  MRN [id number].  Admission date:  07/28/2013  Admitting Physician  Ripudeep Jenna LuoK Rai, MD  Discharge Date:  08/01/2013   Primary MD  Laren BoomHommel, Sean, DO  Recommendations for primary care physician for things to follow:   Monitor CBC BMP closely, patient needs outpatient urology, vascular surgery followup in a timely fashion   Admission Diagnosis  Hypotension, unspecified hypotension type [458.9] Urinary tract infection with hematuria, site unspecified [599.0]   Discharge Diagnosis  Hypotension, unspecified hypotension type [458.9] Urinary tract infection with hematuria, site unspecified [599.0] acute cystitis  Principal Problem:   Sepsis secondary to UTI Active Problems:   HYPERLIPIDEMIA   PAROXYSMAL ATRIAL FIBRILLATION   AORTIC VALVE REPLACEMENT, HX OF   Hearing loss of both ears   GERD (gastroesophageal reflux disease)   Orthostatic hypotension   Interstitial pulmonary fibrosis   Urinary tract infection   Hypotension      Past Medical History  Diagnosis Date  . Hypertension   . Hyperlipidemia   . Aortic stenosis     AVR 2011  . Dermatitis     poison ivy  . Cleft palate   . Interstitial lung disease   . Pulmonary fibrosis   . Syncope and collapse 07/18/2014  . GERD (gastroesophageal reflux disease)     Past Surgical History  Procedure Laterality Date  . Bilat cataract sx  2004  . Cholecystectomy  1999  . 7 surgeries for cleft palate  1938  . Oral surgery with bone graft from hip for cleft palate  1989  . Right groin hernia repair  1950's  . Rotator cuff tear, left shoulder    . Aortic valve replacement w/ pericardial tissue valve  10-15-09    Dr Barry Dieneswens       History of present illness and  Hospital Course:     Kindly see H&P for history of present illness  and admission details, please review complete Labs, Consult reports and Test reports for all details in brief  HPI from the H&P done upon admission  Roy MorganRobert Chan is a 79 y.o. male, with history of aortic stenosis status post bovine aortic valve replacement, chronic pulmonary fibrosis on chronic prednisone and 2 L nasal cannula home oxygen follows with Dr. Shan LevansPatrick Wright, orthostatic hypotension for which he was [facility] few weeks ago [facility], chronic anemia nonspecific, GERD, hypertension, dyslipidemia who is also hard of hearing presented [facility] with 12-15 hour history of dysuria, fever and chills.    At [facility] his workup was consistent with sepsis secondary to urinary tract infection, he was hypotensive, had leukocytosis, he received IV antibiotics, urine and blood cultures were drawn, IV fluids were given with improvement in blood pressure, he was then transferred to step down [facility] at [facility] for further treatment.    And the patient is feeling better, denies any fevers at this time, no headache, no chest pain, no cough phlegm shortness of breath,  denies any abdominal pain, no diarrhea, denies any knowledge of prostate problems, no previous UTIs, last admission he did not receive a Foley catheter. No focal weakness.    Hospital Course    1.. Sepsis secondary to UTI along with Cystitis. Clinically much improved, renal ultrasound rules out hydronephrosis, blood and urine cultures are noted, switched to Levaquin based on culture and sensitivity data. Blood pressures are better and he's not orthostatic, will taper stress dose steroids, did develop mild fluid overload due to IV fluids he initially received  for hypotension due to sepsis, fluid overload resolved after gentle Lasix one time given on 07/31/2013. He will need Outpatient urology workup for possible BPH.     2. History of interstitial pulmonary fibrosis. He is on 3 L nasal cannula oxygen  at home along with chronic 20 mg of prednisone, stable from this standpoint we'll follow with his primary pulmonologist Dr. Shan Levans.     3. 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 No acute issues.     4. History of paroxysmal atrial fibrillation. Currently on aspirin, as needed IV Lopressor for rate control, not on any rate controlling agents chronically. We'll defer long-term management to primary cardiologist Dr. Jens Som.     5. History of orthostatic hypotension with recent episode of syncope and fall requiring hospitalization few weeks ago. Stable, blood pressure better taper stress dose steroids back to home dose in the next 3-4 days, continue home dose ProAmatine, Not orthostatic this morning. Continue TED stockings. Will get home PT and RN. Given written instructions on orthostatic hypotension and precautions.    6. GERD. On PPI continue.     7. Dyslipidemia. Continue home dose statin.     8. Chronic anemia. Stable.     9. low potassium. Replaced, recheck BMP in 3 days.   10. Possible bladder mass on ultrasound and confirmed on CT scan along with enlarged prostate. Outpatient urology followup.    11. 3 cm abdominal aneurysm. We'll have him follow with vascular surgery on a close basis.    12. Leukocytosis - due to #1 above along with steroid use. Trend stable. Repeat CBC in 3 days.        Discharge Condition: stable   Follow UP  Follow-up Information   Follow up with Laren Boom, DO. Schedule an appointment as soon as possible for a visit in 3 days.   Specialty:  Family Medicine   Contact information:   [address] [place] [address] [phone]       Follow up with Jerilee Field, MD. Schedule an appointment as soon as possible for a visit in 1 week. (Evaluation of bladder mass and BPH)    Specialty:  Urology   Contact information:   [address] [place] [address] [phone]       Follow up with  Shan Levans, MD. Schedule an appointment as soon as possible for a visit in 1 week.   Specialty:  Pulmonary Disease   Contact information:   [address] [place] [address] [phone]       Follow up with Olga Millers, MD. Schedule an appointment as soon as possible for a visit in 1 week.   Specialty:  Cardiology   Contact information:   [address] [place] [address] [phone]       Follow up with EARLY, TODD, MD. Schedule an appointment as soon as possible for a visit in 1 week. (Small  abdominal aneurysm)    Specialty:  Vascular Surgery   Contact information:   [address] [place] [address] [phone]         Discharge Instructions  and  Discharge Medications      Discharge Instructions   Diet - low sodium heart healthy    Complete by:  As directed      Discharge instructions    Complete by:  As directed   Follow with Primary MD Laren Boom, DO in 3 days   Get CBC, CMP, 2 view Chest X ray checked  by Primary MD next visit.   You must sit at a stationary position for 5 minutes and do leg extension exercises as taught for 5 minutes before you start walking from a resting position or after getting up from a bed, once you stand up , stand at that spot for 3-5 minutes while holding on to a wall-bed-heavy furniture and then walk only if you are not dizzy, using a  walker at all times, if you still get dizzy sit down, and call for help.   Activity: As tolerated with Full fall precautions use walker/cane & assistance as needed   Disposition Home     Diet: Heart Healthy    For Heart failure patients - Check your Weight same time everyday, if you gain over 2 pounds, or you develop in leg swelling, experience more shortness of breath or chest pain, call your Primary MD immediately. Follow Cardiac Low Salt Diet and 1.8 lit/day fluid restriction.   On your next visit with her primary care physician please Get Medicines reviewed and  adjusted.  Please request your Prim.MD to go over all Hospital Tests and Procedure/Radiological results at the follow up, please get all Hospital records sent to your Prim MD by signing hospital release before you go home.   If you experience worsening of your admission symptoms, develop shortness of breath, life threatening emergency, suicidal or homicidal thoughts you must seek medical attention immediately by calling 911 or calling your MD immediately  if symptoms less severe.  You Must read complete instructions/literature along with all the possible adverse reactions/side effects for all the Medicines you take and that have been prescribed to you. Take any new Medicines after you have completely understood and accpet all the possible adverse reactions/side effects.   Do not drive, operating heavy machinery, perform activities at heights, swimming or participation in water activities or provide baby sitting services if your were admitted for syncope or siezures until you have seen by Primary MD or a Neurologist and advised to do so again.  Do not drive when taking Pain medications.    Do not take more than prescribed Pain, Sleep and Anxiety Medications  Special Instructions: If you have smoked or chewed Tobacco  in the last 2 yrs please stop smoking, stop any regular Alcohol  and or any Recreational drug use.  Wear Seat belts while driving.   Please note  You were cared for by a hospitalist during your hospital stay. If you have any questions about your discharge medications or the care you received while you were in the hospital after you are discharged, you can call the unit and asked to speak with the hospitalist on call if the hospitalist that took care of you is not available. Once you are discharged, your primary care physician will handle any further medical issues. Please note that NO REFILLS for any discharge medications will be authorized once you are discharged,  as it is  imperative that you return to your primary care physician (or establish a relationship with a primary care physician if you do not have one) for your aftercare needs so that they can reassess your need for medications and monitor your lab values.  Follow with Primary MD Laren Boom, DO in 3 days   Get CBC, CMP, 2 view Chest X ray checked  by Primary MD next visit.   You must sit at a stationary position for 5 minutes and do leg extension exercises as taught for 5 minutes before you start walking from a resting position or after getting up from a bed, once you stand up , stand at that spot for 3-5 minutes while holding on to a wall-bed-heavy furniture and then walk only if you are not dizzy, using a  walker at all times, if you still get dizzy sit down, and call for help.   Activity: As tolerated with Full fall precautions use walker/cane & assistance as needed   Disposition Home     Diet: Heart Healthy    For Heart failure patients - Check your Weight same time everyday, if you gain over 2 pounds, or you develop in leg swelling, experience more shortness of breath or chest pain, call your Primary MD immediately. Follow Cardiac Low Salt Diet and 1.8 lit/day fluid restriction.   On your next visit with her primary care physician please Get Medicines reviewed and adjusted.  Please request your Prim.MD to go over all Hospital Tests and Procedure/Radiological results at the follow up, please get all Hospital records sent to your Prim MD by signing hospital release before you go home.   If you experience worsening of your admission symptoms, develop shortness of breath, life threatening emergency, suicidal or homicidal thoughts you must seek medical attention immediately by calling 911 or calling your MD immediately  if symptoms less severe.  You Must read complete instructions/literature along with all the possible adverse reactions/side effects for all the Medicines you take and that have been  prescribed to you. Take any new Medicines after you have completely understood and accpet all the possible adverse reactions/side effects.   Do not drive, operating heavy machinery, perform activities at heights, swimming or participation in water activities or provide baby sitting services if your were admitted for syncope or siezures until you have seen by Primary MD or a Neurologist and advised to do so again.  Do not drive when taking Pain medications.    Do not take more than prescribed Pain, Sleep and Anxiety Medications  Special Instructions: If you have smoked or chewed Tobacco  in the last 2 yrs please stop smoking, stop any regular Alcohol  and or any Recreational drug use.  Wear Seat belts while driving.   Please note  You were cared for by a hospitalist during your hospital stay. If you have any questions about your discharge medications or the care you received while you were in the hospital after you are discharged, you can call the unit and asked to speak with the hospitalist on call if the hospitalist that took care of you is not available. Once you are discharged, your primary care physician will handle any further medical issues. Please note that NO REFILLS for any discharge medications will be authorized once you are discharged, as it is imperative that you return to your primary care physician (or establish a relationship with a primary care physician if you do not have one) for your aftercare  needs so that they can reassess your need for medications and monitor your lab values.     Increase activity slowly    Complete by:  As directed             Medication List         aspirin 81 MG tablet  Take 81 mg by mouth every morning.     benzonatate 100 MG capsule  Commonly known as:  TESSALON PERLES  Take 1 capsule (100 mg total) by mouth 3 (three) times daily as needed for cough.     ferrous sulfate 325 (65 FE) MG EC tablet  Take 1 tablet (325 mg total) by mouth 3  (three) times daily with meals.     levofloxacin 750 MG tablet  Commonly known as:  LEVAQUIN  Take 1 tablet (750 mg total) by mouth daily.     loratadine 10 MG tablet  Commonly known as:  CLARITIN  Take 10 mg by mouth every morning.     midodrine 2.5 MG tablet  Commonly known as:  PROAMATINE  Take 1 tablet (2.5 mg total) by mouth 3 (three) times daily with meals.     multivitamin with minerals Tabs tablet  Take 1 tablet by mouth every morning.     neomycin-bacitracin-polymyxin Oint  Commonly known as:  NEOSPORIN  Apply 1 application topically as needed for irritation or wound care.     OVER THE COUNTER MEDICATION  Super beta prostate - take 1 tablet by mouth twice daily     pantoprazole 40 MG tablet  Commonly known as:  PROTONIX  Take 40 mg by mouth every morning.     pravastatin 40 MG tablet  Commonly known as:  PRAVACHOL  Take 40 mg by mouth every evening.     predniSONE 10 MG tablet  Commonly known as:  DELTASONE  Take 2 tablets (20 mg total) by mouth daily with breakfast. Take 3 pills daily for 4 days, then go down to 2 pills daily     saccharomyces boulardii 250 MG capsule  Commonly known as:  FLORASTOR  Take 1 capsule (250 mg total) by mouth 2 (two) times daily.     SALINE NASAL MIST NA  Place 1 spray into the nose daily as needed (for congestion).          Diet and Activity recommendation: See Discharge Instructions above   Consults obtained -  None   Major procedures and Radiology Reports - PLEASE review detailed and final reports for all details, in brief -       Ct Abdomen Pelvis W Wo Contrast  07/29/2013   CLINICAL DATA:  Follow-up possible bladder mass identified on recent ultrasound. Patient hospitalized for sepsis secondary to urinary tract infection with clinical improvement. Patient now has painless hematuria.  EXAM: CT ABDOMEN AND PELVIS WITHOUT AND WITH CONTRAST  TECHNIQUE: Multidetector CT imaging of the abdomen and pelvis was performed  following the standard protocol before and following the bolus administration of intravenous contrast. Delayed prone imaging was also performed.  CONTRAST:  100 CC Omnipaque 300 IV.  COMPARISON:  No prior CT.  Urinary tract ultrasound yesterday.  FINDINGS: Unenhanced images demonstrate no urinary tract calculi on either side. Enhanced images demonstrate cortical cysts in both kidneys. No solid renal masses. No hydronephrosis. Parapelvic cysts involving the right kidney. Renal sinus lipomatosis bilaterally.  Diffuse bladder wall thickening is present, with hyperemia and edema/inflammation in the surrounding fat. There is a filling defect within the contrast  filled bladder on the delayed prone images consistent with clot. Seen best on the sagittal reconstructed images is a mass arising from the posterior wall of the bladder measuring approximately 2 x 1 cm (series 5010, image 60). This is separate from the freely floating clot within the bladder.  Prostate gland enlarged, measuring approximately 5.8 x 5.6 x 7.0 cm, containing calcifications, with irregularity in the contour. Seminal vesicles normal. Normal sized pelvic lymph nodes without evidence of significant pelvic lymphadenopathy.  Very large hiatal hernia, with approximately 2/3 of the stomach present in the chest. Normal-appearing small bowel. Moderate to large stool burden throughout the colon. Diffuse colonic diverticulosis without evidence of acute diverticulitis. Mobile cecum positioned in the right upper abdomen. Appendix not clearly visualized, but no pericecal inflammation. No ascites.  Normal-appearing liver. Spleen mildly enlarged measuring approximately 13.5 x 11.3 x 8.9 cm, yielding a volume of approximately 673 cc. No focal splenic parenchymal abnormality. Normal pancreas. Normal adrenal glands. Gallbladder surgically absent. No biliary ductal dilation. Aortoiliofemoral atherosclerosis with a focal saccular aneurysm involving the distal abdominal  aorta above the bifurcation measuring 3.0 cm diameter. No significant lymphadenopathy in the abdomen.  Images of the lung bases demonstrate interstitial pulmonary fibrosis with greater involvement in the right lung base. Bilateral lower lobe bronchiectasis is present. Heart markedly enlarged. Prior aortic valve replacement.  Bone window images demonstrate lower thoracic spondylosis and facet degenerative changes throughout the lumbar spine.  IMPRESSION: 1. Approximate 2 cm mass arising from the posterior wall of the urinary bladder. 2. Diffuse bladder wall thickening and edema/inflammation in the fat surrounding the bladder, consistent with cystitis. 3. No evidence of urinary tract calculi or obstruction. Simple cysts in both kidneys. 4. Prostate gland enlargement with irregular prostate contour. Please correlate with serum PSA. 5. 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 6. Diffuse colonic diverticulosis without evidence of acute diverticulitis. 7. Mild splenomegaly.  No focal splenic parenchymal abnormality. 8. Interstitial pulmonary fibrosis in the visualized lung bases, right greater than left. Bilateral lower lobe bronchiectasis.   Electronically Signed   By: Hulan Saas M.D.   On: 07/29/2013 10:18   Dg Chest 2 View  07/17/2013   CLINICAL DATA:  Loss of consciousness. Weakness for 1 day. Aortic valve replacement in 2011.  EXAM: CHEST  2 VIEW  COMPARISON:  07/08/2013.  FINDINGS: Median sternotomy and bioprosthetic aortic valve. Cholecystectomy clips are present in the right upper quadrant. Diffuse interstitial opacity is present compatible with pulmonary fibrosis demonstrated on prior CT. No definite superimposed airspace opacity. The cardiopericardial silhouette is within normal limits for projection. Mediastinal contours are unchanged. Unchanged rightward tracheal deviation. Chronic right AC joint separation.  IMPRESSION: Chronic interstitial lung disease  without acute cardiopulmonary disease.   Electronically Signed   By: Andreas Newport M.D.   On: 07/17/2013 07:24   Dg Chest 2 View  07/08/2013   CLINICAL DATA:  Productive cough.  EXAM: CHEST  2 VIEW  COMPARISON:  08/03/2012 and CT 05/2012  FINDINGS: Two views of the chest were obtained. Patient has known interstitial lung disease based on the previous CT. Again noted are prominent interstitial lung markings with some volume loss in the right lung. Retrocardiac density is compatible with a large hiatal hernia. Heart size is stable and upper limits of normal. Median sternotomy wires are noted. Previous aortic valve surgery.  IMPRESSION: Diffuse interstitial lung densities likely represent chronic changes. Difficult to exclude mild edema. Minimal change since the previous examination.  Electronically Signed   By: Richarda Overlie M.D.   On: 07/08/2013 08:26   US Renal  07/28/2013   CLINICAL DATA:  Acute renal failure  EXAM: RENAL/URINARY TRACT ULTRASOUND COMPLETE  COMPARISON:  None.  FINDINGS: Right Kidney:  Length: 10.4 cm. Echogenicity is within normal limits. No hydronephrosis. 1.5 cm hypoechoic area in the upper pole, likely small cyst.  Left Kidney:  Length: 11.1 cm. Echogenicity within normal limits. No mass or hydronephrosis visualized.  Bladder:  Abnormal soft tissue noted along the posterior bladder wall. This does not significantly change when placing the patient in the right lateral decubitus position. There appears to be some internal blood flow. Cannot exclude bladder wall lesion/mass.  IMPRESSION: No evidence of hydronephrosis.  Question posterior soft tissue bladder wall mass. Recommend further evaluation with CT of the abdomen and pelvis without and with contrast, to include delayed prone images through the bladder.   Electronically Signed   By: Charlett Nose M.D.   On: 07/28/2013 19:33    Micro Results      Recent Results (from the past 240 hour(s))  URINE CULTURE     Status: None   Collection  Time    07/28/13  9:20 AM      Result Value Ref Range Status   Specimen Description URINE, CLEAN CATCH   Final   Special Requests NONE   Final   Culture  Setup Time     Final   Value: 07/28/2013 21:40     Performed at [facility] Count     Final   Value: >=100,000 COLONIES/ML     Performed at [facility]   Culture     Final   Value: CITROBACTER FREUNDII     Performed at [facility]   Report Status 07/30/2013 FINAL   Final   Organism ID, Bacteria CITROBACTER FREUNDII   Final  CULTURE, BLOOD (ROUTINE X 2)     Status: None   Collection Time    07/28/13 12:40 PM      Result Value Ref Range Status   Specimen Description BLOOD RIGHT ANTECUBITAL   Final   Special Requests BOTTLES DRAWN AEROBIC AND ANAEROBIC 6 ML   Final   Culture  Setup Time     Final   Value: 07/28/2013 19:48     Performed at [facility]   Culture     Final   Value:        BLOOD CULTURE RECEIVED NO GROWTH TO DATE CULTURE WILL BE HELD FOR 5 DAYS BEFORE ISSUING A FINAL NEGATIVE REPORT     Performed at [facility]   Report Status PENDING   Incomplete  MRSA PCR SCREENING     Status: None   Collection Time    07/28/13  6:19 PM      Result Value Ref Range Status   MRSA by PCR NEGATIVE  NEGATIVE Final   Comment:            The GeneXpert MRSA Assay (FDA     approved for NASAL specimens     only), is one component of a     comprehensive MRSA colonization     surveillance program. It is not     intended to diagnose MRSA     infection nor to guide or     monitor treatment for     MRSA infections.       Today   Subjective:   [name]  today has no headache,no chest abdominal pain,no new weakness tingling or numbness, feels much better wants to go home today.    Objective:   Blood pressure 116/62, pulse 88, temperature 98.4 F (36.9 C), temperature source Oral, resp. rate 20, height 6' (1.829 m), weight 103.3 kg (227 lb 11.8 oz), SpO2  94.00%.   Intake/Output Summary (Last 24 hours) at 08/01/13 1202 Last data filed at 08/01/13 0432  Gross per 24 hour  Intake    360 ml  Output   1400 ml  Net  -1040 ml    Exam Awake Alert, Oriented x 3, No new F.N deficits, Normal affect .AT,PERRAL Supple Neck,No JVD, No cervical lymphadenopathy appriciated.  Symmetrical Chest wall movement, Good air movement bilaterally, CTAB RRR,No Gallops,Rubs or new Murmurs, No Parasternal Heave +ve B.Sounds, Abd Soft, Non tender, No organomegaly appriciated, No rebound -guarding or rigidity. No Cyanosis, Clubbing or edema, No new Rash or bruise  Data Review   CBC w Diff: Lab Results  Component Value Date   WBC 14.3* 07/31/2013   HGB 8.8* 07/31/2013   HCT 25.8* 07/31/2013   PLT 126* 07/31/2013   LYMPHOPCT 7* 07/28/2013   BANDSPCT 1 07/28/2013   MONOPCT 7 07/28/2013   EOSPCT 0 07/28/2013   BASOPCT 0 07/28/2013    CMP: Lab Results  Component Value Date   NA 134* 07/30/2013   K 3.5* 08/01/2013   CL 95* 07/30/2013   CO2 25 07/30/2013   BUN 21 07/30/2013   CREATININE 0.95 07/30/2013   CREATININE 1.53* 06/01/2012   PROT 6.2 07/28/2013   ALBUMIN 3.1* 07/28/2013   BILITOT 0.8 07/28/2013   ALKPHOS 54 07/28/2013   AST 28 07/28/2013   ALT 23 07/28/2013  .   Total Time in preparing paper work, data evaluation and todays exam - 35 minutes  Leroy SeaSINGH,Mattia Liford K M.D on 08/01/2013 at 12:02 PM  [facility] Office  [phone]   **Disclaimer: This note may have been dictated with voice recognition software. Similar sounding words can inadvertently be transcribed and this note may contain transcription errors which may not have been corrected upon publication of note.**

## 2013-08-01 NOTE — Progress Notes (Signed)
Physical Therapy Treatment Patient Details Name: Roy BundeRobert G Chan MRN: [id number] DOB: 23-Sep-1934 Today's Date: 08/01/2013    History of Present Illness Patient is a 79 yo male admitted 07/28/13 with sepsis, UTI.  Patient with h/o pulm fibrosis on O2 at home, orthostatic hypotension, HTN, AVR, PAF,  Lt shoulder surgery, HOH.    PT Comments    Patient's mobility continues to be limited secondary to respiratory status and inability to maintain Sp02 >90% with exercise/activity. Patient aware of when 02 drops as pt becomes dizzy, light headed and diaphoretic at times. Takes long periods of seated rest breaks for sats to recover. Pt would benefit from use of rollator for community ambulation due to impaired cardiovascular endurance and energy conservation as pt limited by respiratory status. Educated pt on different ways to obtain necessary 02 as pt with difficulty breathing in through nasal passages secondary to cleft palate. Wife and patient verbalized understanding of techniques to improve ventilation and importance of short bouts of activity. Will continue to follow.   Follow Up Recommendations  Supervision - Intermittent (Would benefit from OP cardiopulmonary rehab once medically appropriate.)     Equipment Recommendations  Other (comment) (Rollator for 6'0 tall male.)    Recommendations for Other Services       Precautions / Restrictions Precautions Precautions: Fall Precaution Comments: watch 02 sats. Restrictions Weight Bearing Restrictions: No    Mobility  Bed Mobility               General bed mobility comments: Pt received in chair on arrival.  Transfers Overall transfer level: Needs assistance Equipment used: Rolling walker (2 wheeled) Transfers: Sit to/from Stand Sit to Stand: Supervision         General transfer comment: VC for hand placement as pt unfamiliar with RW. VC for stabilizing balance and assessing for dizziness prior to  mobility.  Ambulation/Gait Ambulation/Gait assistance: Supervision Ambulation Distance (Feet): 75 Feet (+ 3475' with 1 standing rest break, 1 seated rest break) Assistive device: Rolling walker (2 wheeled) Gait Pattern/deviations: Step-through pattern;Decreased stride length;Trunk flexed Gait velocity: Decreased to manage dyspnea with 6 minute rest half way through.   General Gait Details: Pt with cues for upright posture as well as breathing pattern throughout ambulation to manage dyspnea. RW used for energy conservation and pt comfort. Pt with drop in Sp02 to 84% on 4L 02 requiring 6 minutes to recover to 94% with increase to 6L 02 [place]. Sp02 decreased to 77% on return to room on 6L 02, needing to increase to 8L 02 to have 02 recover to 90% after seated rest break. Returned Sp02 to 6L for last 25'. Diaphoretic and pallor when Sp02 dropped to 77%. See vitals section for BP.   Stairs            Wheelchair Mobility    Modified Rankin (Stroke Patients Only)       Balance Overall balance assessment: No apparent balance deficits (not formally assessed)         Standing balance support: Bilateral upper extremity supported;During functional activity   Standing balance comment: Use of RW for pt comfort and energy conservation; also due to symptoms of dizziness reported due to decrease in Sp02.                    Cognition Arousal/Alertness: Awake/alert Behavior During Therapy: WFL for tasks assessed/performed Overall Cognitive Status: Within Functional Limits for tasks assessed  Exercises      General Comments        Pertinent Vitals/Pain 0/10 pain. See gait section for details on Sp02 during ambulation. Seated BP 121/76 - taken on way back to room. Pt became diaphoretic and pallor resulting in need for seated rest break as pt's Sp02 decreased to 77%. When returning to room, 02 decreased to 4L and Sp02 returned to 96%. RN aware.    Home  Living                      Prior Function            PT Goals (current goals can now be found in the care plan section) Progress towards PT goals: Progressing toward goals (Slowly limited due to respiratory status.)    Frequency  Min 3X/week    PT Plan Current plan remains appropriate    Co-evaluation             End of Session Equipment Utilized During Treatment: Gait belt;Oxygen Activity Tolerance: Patient limited by fatigue;Treatment limited secondary to medical complications (Comment) (Limited secondary to drop in Sp02 with exercise.) Patient left: in chair;with call bell/phone within reach;with chair alarm set;with nursing/sitter in room     Time: 1203-1241 PT Time Calculation (min): 38 min  Charges:  $Gait Training: 8-22 mins $Therapeutic Exercise: 23-37 mins                    G CodesAlvie Heidelberg:      Folan, Stevie Ertle A 08/01/2013, 2:53 PM Alvie HeidelbergShauna Folan, PT, DPT [phone]

## 2013-08-01 NOTE — Telephone Encounter (Signed)
Patient wife has been informed, she wants to know if she should still make a hospital follow up in 3 days?

## 2013-08-01 NOTE — Telephone Encounter (Signed)
Dr. Ivan AnchorsHommel please see note below. Armend Hochstatter,CMA

## 2013-08-01 NOTE — Discharge Instructions (Signed)
Follow with Primary MD Laren BoomHommel, Sean, DO in 3 days   Get CBC, CMP, 2 view Chest X ray checked  by Primary MD next visit.   You must sit at a stationary position for 5 minutes and do leg extension exercises as taught for 5 minutes before you start walking from a resting position or after getting up from a bed, once you stand up , stand at that spot for 3-5 minutes while holding on to a wall-bed-heavy furniture and then walk only if you are not dizzy, using a  walker at all times, if you still get dizzy sit down, and call for help.   Activity: As tolerated with Full fall precautions use walker/cane & assistance as needed   Disposition Home     Diet: Heart Healthy    For Heart failure patients - Check your Weight same time everyday, if you gain over 2 pounds, or you develop in leg swelling, experience more shortness of breath or chest pain, call your Primary MD immediately. Follow Cardiac Low Salt Diet and 1.8 lit/day fluid restriction.   On your next visit with her primary care physician please Get Medicines reviewed and adjusted.  Please request your Prim.MD to go over all Hospital Tests and Procedure/Radiological results at the follow up, please get all Hospital records sent to your Prim MD by signing hospital release before you go home.   If you experience worsening of your admission symptoms, develop shortness of breath, life threatening emergency, suicidal or homicidal thoughts you must seek medical attention immediately by calling 911 or calling your MD immediately  if symptoms less severe.  You Must read complete instructions/literature along with all the possible adverse reactions/side effects for all the Medicines you take and that have been prescribed to you. Take any new Medicines after you have completely understood and accpet all the possible adverse reactions/side effects.   Do not drive, operating heavy machinery, perform activities at heights, swimming or participation in  water activities or provide baby sitting services if your were admitted for syncope or siezures until you have seen by Primary MD or a Neurologist and advised to do so again.  Do not drive when taking Pain medications.    Do not take more than prescribed Pain, Sleep and Anxiety Medications  Special Instructions: If you have smoked or chewed Tobacco  in the last 2 yrs please stop smoking, stop any regular Alcohol  and or any Recreational drug use.  Wear Seat belts while driving.   Please note  You were cared for by a hospitalist during your hospital stay. If you have any questions about your discharge medications or the care you received while you were in the hospital after you are discharged, you can call the unit and asked to speak with the hospitalist on call if the hospitalist that took care of you is not available. Once you are discharged, your primary care physician will handle any further medical issues. Please note that NO REFILLS for any discharge medications will be authorized once you are discharged, as it is imperative that you return to your primary care physician (or establish a relationship with a primary care physician if you do not have one) for your aftercare needs so that they can reassess your need for medications and monitor your lab values.

## 2013-08-02 ENCOUNTER — Encounter: Payer: Self-pay | Admitting: Critical Care Medicine

## 2013-08-02 ENCOUNTER — Encounter: Payer: Self-pay | Admitting: Family Medicine

## 2013-08-02 DIAGNOSIS — D649 Anemia, unspecified: Secondary | ICD-10-CM | POA: Diagnosis not present

## 2013-08-02 DIAGNOSIS — I10 Essential (primary) hypertension: Secondary | ICD-10-CM | POA: Diagnosis not present

## 2013-08-02 DIAGNOSIS — I4891 Unspecified atrial fibrillation: Secondary | ICD-10-CM | POA: Diagnosis not present

## 2013-08-02 DIAGNOSIS — I951 Orthostatic hypotension: Secondary | ICD-10-CM | POA: Diagnosis not present

## 2013-08-02 DIAGNOSIS — N390 Urinary tract infection, site not specified: Secondary | ICD-10-CM | POA: Diagnosis not present

## 2013-08-02 DIAGNOSIS — J84112 Idiopathic pulmonary fibrosis: Secondary | ICD-10-CM | POA: Diagnosis not present

## 2013-08-02 NOTE — Telephone Encounter (Signed)
I'd still recommend follow up with me or any of my partners since I'll be out of town next week in order to recheck some blood work and to make any necessary adjustments to the new blood pressure medication I started at his last visit.

## 2013-08-02 NOTE — Discharge Summary (Signed)
Physician Discharge Summary  Roy BundeRobert G Chan [name]:[id number] DOB: Aug 12, 1934 DOA: 07/17/2013  PCP: Laren BoomHommel, Sean, DO  Admit date: 07/17/2013 Discharge date: 08/02/2013  Time spent: 25 minutes  Recommendations for Outpatient Follow-up:  1. Follow up as OP for labs in ~ 1 week  2. Hold pirfenidone for now for COPD/Pulm Interstitial fibrosis   Discharge Diagnoses:  Principal Problem:   Syncope and collapse Active Problems:   HYPERLIPIDEMIA   Aortic valve disorders   PAROXYSMAL ATRIAL FIBRILLATION   AORTIC VALVE REPLACEMENT, HX OF   BOOP (bronchiolitis obliterans with organizing pneumonia)   GERD (gastroesophageal reflux disease)   Orthostatic hypotension   Anemia   Interstitial pulmonary fibrosis   Discharge Condition: good  Diet recommendation: reg  [facility]   07/17/13 2033 07/18/13 2058  Weight: 102.694 kg (226 lb 6.4 oz) 102.694 kg (226 lb 6.4 oz)    History of present illness:  79 y/o ?, known h/o Htn, Boop, AoS severe s/p AVR 10/15/09, prior Afib on AMioCAD [cath 2011=Non-obst Pulm disease] Pulm IF started on Pirfenidone admitted with syncopal episode 07/17/13. Has h/o chronic ortho stasis but has never had a syncopal episode like before. also noted to have coolness to the LUE.   Hospital Course:  Assessment/Plan:  1. Likely orthostatic hypotension-D/c BB. Esbriet can cause dizziness in 30% cases. Would avoid and discussed with Pulm MD Dr. Delford FieldWright as there isrisk of dizzyness/fall etc.. I have mentioned to wife he would be safest with 24/7 care 2. Severe AS s/p AVR 2011-monitor. Echo 05/2013 didn't show any issues with valve.  3. Cad-cath non-obstructive recently. Monitor. BB d/c given orthostasis-could try Coreg lower dose as OP under guidance of PCP. Cont asa 81 daily-i recommended metorpolol only if blood pressures were above 155/85 on d/c home 4. Boop/[place] lung disease continue prednisone 20 mg daily. See above discussion. Continue Augmentin 1 tab bid for 5-7  days 5. Genella RifeGerd. Continue Prtonix 40 daily 6. Cool LUE-Arterial doppler of UE's didn'tt confirm any clot or any    Procedures: Left upper extremity arterial Duplex completed.  Preliminary report: Left upper extremity arterial duplex reveals no evidence of stenosis and triphasic waveforms in the subclavian, axillary, brachial, ulnar, radial arteries as well as normal flow noted in the left palmar arch.   Consultations:  none  Discharge Exam: Filed Vitals:   07/19/13 1801  BP: 134/67  Pulse: 72  Temp: 99.7 F (37.6 C)  Resp: 18    General: eomi, ncat, no pallor ict Cardiovascular: s1 s 2no m/r/g Respiratory:  Clearer but some mild crackles  Discharge Instructions You were cared for by a hospitalist during your hospital stay. If you have any questions about your discharge medications or the care you received while you were in the hospital after you are discharged, you can call the unit and asked to speak with the hospitalist on call if the hospitalist that took care of you is not available. Once you are discharged, your primary care physician will handle any further medical issues. Please note that NO REFILLS for any discharge medications will be authorized once you are discharged, as it is imperative that you return to your primary care physician (or establish a relationship with a primary care physician if you do not have one) for your aftercare needs so that they can reassess your need for medications and monitor your lab values.  Discharge Instructions   Diet - low sodium heart healthy    Complete by:  As directed  Discharge instructions    Complete by:  As directed   DO not take the Esbriet or the metoprolol as a scheduled medicine You may take metoprolol 1 tablet if blood pressure above 155/85 Continue prednisone 20 mg daily long term until you speak to your regular MD Please follow with PCP and Dr. Delford Field for further instruction     Increase activity slowly    Complete  by:  As directed             Medication List    STOP taking these medications       amoxicillin-clavulanate 875-125 MG per tablet  Commonly known as:  AUGMENTIN     metoprolol tartrate 25 MG tablet  Commonly known as:  LOPRESSOR     Pirfenidone 267 MG Caps  Commonly known as:  ESBRIET     predniSONE 10 MG tablet  Commonly known as:  DELTASONE      TAKE these medications       aspirin 81 MG tablet  Take 81 mg by mouth every morning.     benzonatate 100 MG capsule  Commonly known as:  TESSALON PERLES  Take 1 capsule (100 mg total) by mouth 3 (three) times daily as needed for cough.     loratadine 10 MG tablet  Commonly known as:  CLARITIN  Take 10 mg by mouth every morning.     OVER THE COUNTER MEDICATION  Super beta prostate - take 1 tablet by mouth twice daily     pantoprazole 40 MG tablet  Commonly known as:  PROTONIX  Take 40 mg by mouth every morning.     pravastatin 40 MG tablet  Commonly known as:  PRAVACHOL  Take 40 mg by mouth every evening.       Allergies  Allergen Reactions  . Pravastatin Sodium Other (See Comments)    Sneezing episodes, can take medication and tolerates reaction.      The results of significant diagnostics from this hospitalization (including imaging, microbiology, ancillary and laboratory) are listed below for reference.    Significant Diagnostic Studies: Ct Abdomen Pelvis W Wo Contrast  07/29/2013   CLINICAL DATA:  Follow-up possible bladder mass identified on recent ultrasound. Patient hospitalized for sepsis secondary to urinary tract infection with clinical improvement. Patient now has painless hematuria.  EXAM: CT ABDOMEN AND PELVIS WITHOUT AND WITH CONTRAST  TECHNIQUE: Multidetector CT imaging of the abdomen and pelvis was performed following the standard protocol before and following the bolus administration of intravenous contrast. Delayed prone imaging was also performed.  CONTRAST:  100 CC Omnipaque 300 IV.   COMPARISON:  No prior CT.  Urinary tract ultrasound yesterday.  FINDINGS: Unenhanced images demonstrate no urinary tract calculi on either side. Enhanced images demonstrate cortical cysts in both kidneys. No solid renal masses. No hydronephrosis. Parapelvic cysts involving the right kidney. Renal sinus lipomatosis bilaterally.  Diffuse bladder wall thickening is present, with hyperemia and edema/inflammation in the surrounding fat. There is a filling defect within the contrast filled bladder on the delayed prone images consistent with clot. Seen best on the sagittal reconstructed images is a mass arising from the posterior wall of the bladder measuring approximately 2 x 1 cm (series 5010, image 60). This is separate from the freely floating clot within the bladder.  Prostate gland enlarged, measuring approximately 5.8 x 5.6 x 7.0 cm, containing calcifications, with irregularity in the contour. Seminal vesicles normal. Normal sized pelvic lymph nodes without evidence of significant pelvic lymphadenopathy.  Very  large hiatal hernia, with approximately 2/3 of the stomach present in the chest. Normal-appearing small bowel. Moderate to large stool burden throughout the colon. Diffuse colonic diverticulosis without evidence of acute diverticulitis. Mobile cecum positioned in the right upper abdomen. Appendix not clearly visualized, but no pericecal inflammation. No ascites.  Normal-appearing liver. Spleen mildly enlarged measuring approximately 13.5 x 11.3 x 8.9 cm, yielding a volume of approximately 673 cc. No focal splenic parenchymal abnormality. Normal pancreas. Normal adrenal glands. Gallbladder surgically absent. No biliary ductal dilation. 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 No significant lymphadenopathy in the abdomen.  Images of the lung bases demonstrate interstitial pulmonary fibrosis with greater  involvement in the right lung base. Bilateral lower lobe bronchiectasis is present. Heart markedly enlarged. Prior aortic valve replacement.  Bone window images demonstrate lower thoracic spondylosis and facet degenerative changes throughout the lumbar spine.  IMPRESSION: 1. Approximate 2 cm mass arising from the posterior wall of the urinary bladder. 2. Diffuse bladder wall thickening and edema/inflammation in the fat surrounding the bladder, consistent with cystitis. 3. No evidence of urinary tract calculi or obstruction. Simple cysts in both kidneys. 4. Prostate gland enlargement with irregular prostate contour. Please correlate with serum PSA. 5. 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 6. Diffuse colonic diverticulosis without evidence of acute diverticulitis. 7. Mild splenomegaly.  No focal splenic parenchymal abnormality. 8. Interstitial pulmonary fibrosis in the visualized lung bases, right greater than left. Bilateral lower lobe bronchiectasis.   Electronically Signed   By: Hulan Saas M.D.   On: 07/29/2013 10:18   Dg Chest 2 View  07/17/2013   CLINICAL DATA:  Loss of consciousness. Weakness for 1 day. Aortic valve replacement in 2011.  EXAM: CHEST  2 VIEW  COMPARISON:  07/08/2013.  FINDINGS: Median sternotomy and bioprosthetic aortic valve. Cholecystectomy clips are present in the right upper quadrant. Diffuse interstitial opacity is present compatible with pulmonary fibrosis demonstrated on prior CT. No definite superimposed airspace opacity. The cardiopericardial silhouette is within normal limits for projection. Mediastinal contours are unchanged. Unchanged rightward tracheal deviation. Chronic right AC joint separation.  IMPRESSION: Chronic interstitial lung disease without acute cardiopulmonary disease.   Electronically Signed   By: Andreas Newport M.D.   On: 07/17/2013 07:24   Dg Chest 2 View  07/08/2013   CLINICAL DATA:  Productive cough.   EXAM: CHEST  2 VIEW  COMPARISON:  08/03/2012 and CT 05/2012  FINDINGS: Two views of the chest were obtained. Patient has known interstitial lung disease based on the previous CT. Again noted are prominent interstitial lung markings with some volume loss in the right lung. Retrocardiac density is compatible with a large hiatal hernia. Heart size is stable and upper limits of normal. Median sternotomy wires are noted. Previous aortic valve surgery.  IMPRESSION: Diffuse interstitial lung densities likely represent chronic changes. Difficult to exclude mild edema. Minimal change since the previous examination.   Electronically Signed   By: Richarda Overlie M.D.   On: 07/08/2013 08:26   US Renal  07/28/2013   CLINICAL DATA:  Acute renal failure  EXAM: RENAL/URINARY TRACT ULTRASOUND COMPLETE  COMPARISON:  None.  FINDINGS: Right Kidney:  Length: 10.4 cm. Echogenicity is within normal limits. No hydronephrosis. 1.5 cm hypoechoic area in the upper pole, likely small cyst.  Left Kidney:  Length: 11.1 cm. Echogenicity within normal limits. No mass or hydronephrosis visualized.  Bladder:  Abnormal  soft tissue noted along the posterior bladder wall. This does not significantly change when placing the patient in the right lateral decubitus position. There appears to be some internal blood flow. Cannot exclude bladder wall lesion/mass.  IMPRESSION: No evidence of hydronephrosis.  Question posterior soft tissue bladder wall mass. Recommend further evaluation with CT of the abdomen and pelvis without and with contrast, to include delayed prone images through the bladder.   Electronically Signed   By: Charlett Nose M.D.   On: 07/28/2013 19:33    Microbiology: Recent Results (from the past 240 hour(s))  URINE CULTURE     Status: None   Collection Time    07/28/13  9:20 AM      Result Value Ref Range Status   Specimen Description URINE, CLEAN CATCH   Final   Special Requests NONE   Final   Culture  Setup Time     Final   Value:  07/28/2013 21:40     Performed at [facility] Count     Final   Value: >=100,000 COLONIES/ML     Performed at [facility]   Culture     Final   Value: CITROBACTER FREUNDII     Performed at [facility]   Report Status 07/30/2013 FINAL   Final   Organism ID, Bacteria CITROBACTER FREUNDII   Final  CULTURE, BLOOD (ROUTINE X 2)     Status: None   Collection Time    07/28/13 12:40 PM      Result Value Ref Range Status   Specimen Description BLOOD RIGHT ANTECUBITAL   Final   Special Requests BOTTLES DRAWN AEROBIC AND ANAEROBIC 6 ML   Final   Culture  Setup Time     Final   Value: 07/28/2013 19:48     Performed at [facility]   Culture     Final   Value:        BLOOD CULTURE RECEIVED NO GROWTH TO DATE CULTURE WILL BE HELD FOR 5 DAYS BEFORE ISSUING A FINAL NEGATIVE REPORT     Performed at [facility]   Report Status PENDING   Incomplete  MRSA PCR SCREENING     Status: None   Collection Time    07/28/13  6:19 PM      Result Value Ref Range Status   MRSA by PCR NEGATIVE  NEGATIVE Final   Comment:            The GeneXpert MRSA Assay (FDA     approved for NASAL specimens     only), is one component of a     comprehensive MRSA colonization     surveillance program. It is not     intended to diagnose MRSA     infection nor to guide or     monitor treatment for     MRSA infections.     Labs: Basic Metabolic Panel:  Recent Labs Lab 07/28/13 1240 07/28/13 1840 07/29/13 0314 07/29/13 1208 07/30/13 0429 08/01/13 0620  NA 135*  --  136*  --  134*  --   K 4.9  --  5.5* 4.6 4.3 3.5*  CL 98  --  98  --  95*  --   CO2 25  --  25  --  25  --   GLUCOSE 109*  --  137*  --  125*  --   BUN 16  --  17  --  21  --  CREATININE 1.00 1.05 1.03  --  0.95  --   CALCIUM 9.1  --  8.7  --  8.2*  --    Liver Function Tests:  Recent Labs Lab 07/28/13 1240  AST 28  ALT 23  ALKPHOS 54  BILITOT 0.8  PROT 6.2  ALBUMIN 3.1*   No results  found for this basename: LIPASE, AMYLASE,  in the last 168 hours No results found for this basename: AMMONIA,  in the last 168 hours CBC:  Recent Labs Lab 07/28/13 1240 07/28/13 1840 07/29/13 0314 07/30/13 0429 07/31/13 0835  WBC 13.2* 13.5* 18.4* 19.7* 14.3*  NEUTROABS 11.4*  --   --   --   --   HGB 9.1* 8.6* 9.3* 8.4* 8.8*  HCT 27.0* 25.7* 29.5* 25.2* 25.8*  MCV 80.8 79.3 84.5 78.5 78.4  PLT 151 140* 112* 122* 126*   Cardiac Enzymes: No results found for this basename: CKTOTAL, CKMB, CKMBINDEX, TROPONINI,  in the last 168 hours BNP: BNP (last 3 results)  Recent Labs  05/28/13 1010  PROBNP 141.0*   CBG: No results found for this basename: GLUCAP,  in the last 168 hours     Signed:  Rhetta Mura  [facility] 08/02/2013, 5:09 PM

## 2013-08-02 NOTE — Telephone Encounter (Signed)
Message left on vm 

## 2013-08-03 ENCOUNTER — Telehealth: Payer: Self-pay | Admitting: Cardiology

## 2013-08-03 ENCOUNTER — Ambulatory Visit (INDEPENDENT_AMBULATORY_CARE_PROVIDER_SITE_OTHER): Payer: Medicare Other | Admitting: Adult Health

## 2013-08-03 ENCOUNTER — Encounter: Payer: Self-pay | Admitting: Adult Health

## 2013-08-03 VITALS — BP 144/72 | HR 96 | Ht 73.0 in | Wt 215.7 lb

## 2013-08-03 DIAGNOSIS — J8489 Other specified interstitial pulmonary diseases: Secondary | ICD-10-CM

## 2013-08-03 DIAGNOSIS — J8409 Other alveolar and parieto-alveolar conditions: Secondary | ICD-10-CM

## 2013-08-03 DIAGNOSIS — J8410 Pulmonary fibrosis, unspecified: Secondary | ICD-10-CM

## 2013-08-03 LAB — CULTURE, BLOOD (ROUTINE X 2): CULTURE: NO GROWTH

## 2013-08-03 MED ORDER — PREDNISONE 10 MG PO TABS: ORAL_TABLET | ORAL | Status: DC

## 2013-08-03 NOTE — Progress Notes (Signed)
Subjective:    Patient ID: [name], male    DOB: [birth year], 79 y.o.   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 On chronic O2 at  On Esbriet 03/2013  Pulmonary rehab completed 10/2012 Hx of severe AS s/p AVR , CAD   07/10/2013 Acute OV  Complains of 2 weeks of PW pt here for head congestion w/ green mucus, PND, prod cough with green mucus, loss of appetite, increased SOB, wheezing w/ lying down, some tightness x2 weeks . Called in zpack and pred pack without much improvement.  CXR 07/08/13 w/ chronic changes in ER.  Given tessalon in ER  Patient denies any hemoptysis, orthopnea, PND, or increased leg swelling. >>augmentin and steroid rx   08/03/2013 Follow up  Patient returns for a post hospital followup. Patient has been admitted twice over the last 2 weeks. Patient was initially admitted June 25, for a, syncopal episode. This was felt to be secondary to orthostatic hypotension. Patient Roy Rodriguezsbriet was stopped Do to, potential for dizziness, and increased fall risk. Metoprolol was also held. Patient was re\re admitted July 4 through July 8 for acute urinary tract infection complicated by hypotension and sepsis. Patient was treated with aggressive IV antibiotics, and fluid resuscitation . Patient has been referred to urology to evaluate possible BPH. Patient says since discharge. He continues to be very weak and wears out quickly. Patient has a few days of antibiotics, left in his course. Patient does desaturate with walking on 4 L O2 saturations are adequate at, above 90% on 3 L at rest. Patient was evaluated in the office with adequate saturations on 6 L with ambulation, but for only short distances He is currently on prednisone 30 mg. He denies any hemoptysis, orthopnea, PND, or increased leg swelling.      Review of Systems Constitutional:   No  weight loss, night sweats,  Fevers,  chills, ++ fatigue, or  lassitude.  HEENT:   No headaches,  Difficulty swallowing,  Tooth/dental problems, or  Sore throat,                No sneezing, itching, ear ache, nasal congestion, post nasal drip,   CV:  No chest pain,  Orthopnea, PND, swelling in lower extremities, anasarca, dizziness, palpitations, syncope.   GI  No heartburn, indigestion, abdominal pain, nausea, vomiting, diarrhea, change in bowel habits, loss of appetite, bloody stools.   Resp:  .  No chest wall deformity  Skin: no rash or lesions.  GU: no dysuria, change in color of urine, no urgency or frequency.  No flank pain, no hematuria   MS:  No joint pain or swelling.  No decreased range of motion.  No back pain.  Psych:  No change in mood or affect. No depression or anxiety.  No memory loss.         Objective:   Physical Exam GEN: A/Ox3; pleasant , NAD, elderly   HEENT:  [place]/AT,  EACs-clear, TMs-wnl, NOSE-clear, THROAT-clear, no lesions, no postnasal drip or exudate noted. Cleft palate repair hx   NECK:  Supple w/ fair ROM; no JVD; normal carotid impulses w/o bruits; no thyromegaly or nodules palpated; no lymphadenopathy.  RESP  Bibasilar crackles .no accessory muscle use, no dullness to percussion  CARD:  RRR, no m/r/g  , tr  peripheral edema, pulses intact, no cyanosis or clubbing.  GI:   Soft & nt; nml bowel sounds; no  organomegaly or masses detected.  Musco: Warm bil, no deformities or joint swelling noted.   Neuro: alert, no focal deficits noted.    Skin: Warm, no lesions or rashes         Assessment & Plan:

## 2013-08-03 NOTE — Patient Instructions (Signed)
Remain on Prednisone 30mg  daily through 08/05/13  On Monday 08/06/13 begin Prednisone 20mg  daily -hold at this dose until seen back in office in 2 weeks  Oxygen 3l/m at rest , 6l/m with activity /walking  Use walker to get around and rest frequently while walking  Remain off Esbriet as directed.  Follow up Dr. Delford FieldWright  In 2 weeks and As needed   Please contact office for sooner follow up if symptoms do not improve or worsen or seek emergency care

## 2013-08-03 NOTE — Telephone Encounter (Signed)
New message     Pt's bp is all over the place.  Pt c/o fatigue and clammy.  Please advise

## 2013-08-03 NOTE — Assessment & Plan Note (Signed)
Recent mild flare w/ acute infectious illness-UTI   Plan  Remain on Prednisone 30mg  daily through 08/05/13  On Monday 08/06/13 begin Prednisone 20mg  daily -hold at this dose until seen back in office in 2 weeks  Oxygen 3l/m at rest , 6l/m with activity /walking  Use walker to get around and rest frequently while walking  Remain off Esbriet as directed.  Follow up Dr. Delford FieldWright  In 2 weeks and As needed   Please contact office for sooner follow up if symptoms do not improve or worsen or seek emergency care

## 2013-08-03 NOTE — Telephone Encounter (Signed)
Closed encounter °

## 2013-08-03 NOTE — Telephone Encounter (Signed)
Returned call to SudanBernice, [facility]. She reports patient had been in hospital early July for syncopal episode, hypotension, UTI. Prior to this was fatigued and had loss of appetite. [facility] assess, patient was orthostatic (108/64, HR 90 sitting then 74/40, HR 91 standing and was dizzy). She had patient rest and reported his BP was still low and he was too weak to do physical assessment/couldn't transfer from chair to chair. HH nurse advised he go to ED but patient and wife refused.   RN called patient's wife and she informed nurse of hospitalization as well and reiterated the BP issues. She reports patient had pulmonary appmt today and BP was stable and they were told BP management would be deferred to PCP, Dr. Ivan AnchorsHommel. Dr. Ivan AnchorsHommel had prescribed midodrine for low BP, but wife did not give any to patient today has his BP yesterday afternoon was 180/98. She reports he was orthostatic in pulm office but not as bad as usual, and she also reports right and left arm typically differ by about 15pts systolic. At pulm visit, O2 was increased to 5-6L with activity, 3L at rest and as low a 2L at night.   Patient complains of dizziness with position changes, SOB (on O2, pulm fibrosis). Denies chest pain.   Wife asks that Dr. Jens Somrenshaw review CT Abdomen.   RN advised [facility] and [facility] that patient should probably be seen by Dr. Jens Somrenshaw or APP.   Will inform scheduler to make patient appmt and will defer to Dr. Jens Somrenshaw to advise on BP issues in meantime.

## 2013-08-04 DIAGNOSIS — I4891 Unspecified atrial fibrillation: Secondary | ICD-10-CM | POA: Diagnosis not present

## 2013-08-04 DIAGNOSIS — N390 Urinary tract infection, site not specified: Secondary | ICD-10-CM | POA: Diagnosis not present

## 2013-08-04 DIAGNOSIS — I10 Essential (primary) hypertension: Secondary | ICD-10-CM | POA: Diagnosis not present

## 2013-08-04 DIAGNOSIS — D649 Anemia, unspecified: Secondary | ICD-10-CM | POA: Diagnosis not present

## 2013-08-04 DIAGNOSIS — J84112 Idiopathic pulmonary fibrosis: Secondary | ICD-10-CM | POA: Diagnosis not present

## 2013-08-04 DIAGNOSIS — I951 Orthostatic hypotension: Secondary | ICD-10-CM | POA: Diagnosis not present

## 2013-08-04 NOTE — Telephone Encounter (Signed)
Fu ov with pa Olga MillersBrian Kearah Chan

## 2013-08-05 ENCOUNTER — Other Ambulatory Visit: Payer: Self-pay | Admitting: Family Medicine

## 2013-08-06 ENCOUNTER — Encounter: Payer: Self-pay | Admitting: Nurse Practitioner

## 2013-08-06 ENCOUNTER — Ambulatory Visit (INDEPENDENT_AMBULATORY_CARE_PROVIDER_SITE_OTHER): Payer: Medicare Other | Admitting: Nurse Practitioner

## 2013-08-06 VITALS — BP 134/82 | HR 88 | Ht 73.0 in | Wt 214.1 lb

## 2013-08-06 DIAGNOSIS — I951 Orthostatic hypotension: Secondary | ICD-10-CM | POA: Diagnosis not present

## 2013-08-06 DIAGNOSIS — J8409 Other alveolar and parieto-alveolar conditions: Secondary | ICD-10-CM | POA: Diagnosis not present

## 2013-08-06 DIAGNOSIS — J8489 Other specified interstitial pulmonary diseases: Secondary | ICD-10-CM

## 2013-08-06 DIAGNOSIS — R0602 Shortness of breath: Secondary | ICD-10-CM

## 2013-08-06 DIAGNOSIS — I959 Hypotension, unspecified: Secondary | ICD-10-CM

## 2013-08-06 MED ORDER — MIDODRINE HCL 2.5 MG PO TABS: 2.5000 mg | ORAL_TABLET | Freq: Three times a day (TID) | ORAL | Status: DC | PRN

## 2013-08-06 NOTE — Patient Instructions (Signed)
I am glad you are feeling better.   Stay on your current medicines - use the Midodrine only if needed  I will see you in 4 months  Call the [facility] office at [phone] if you have any questions, problems or concerns.

## 2013-08-06 NOTE — Progress Notes (Signed)
[name] Date of Birth: Jun 13, 1934 Medical Record #[id number]  History of Present Illness: [name] is seen back today for a work in visit. Seen for Dr. Jens Somrenshaw. He has had severe AS, s/p AVR in 2011 with a pericardial tissue valve. EF normal by echo from May of 2014. Other issues include chronic DOE, HTN, PAF, nonobstructive CAD by cath in 2011, HTN, HLD, chronic anemia, orthostatic hypotension with recent syncope/fall and interstitial lung disease/BOOP - followed by pulmonary.   Last seen here in March. Seemed to be doing ok.  Called last week - reported that he had been admitted in early July with a syncopal episode, hypotension and then admitted again with a UTI/sepsis.  Ended up getting on Midodrine by his PCP but did not start due to BP being back up. ? Difference in his arms. He remains on chronic oxygen at 5 to 6 liters with activity, 3l at rest and 2l at night. Was to see urology as an outpatient for possible bladder mass on ultrasound and confirmed by CT. Noted to have a 3 cm AAA and asked to see vascular surgery as well. On chronic steroid therapy.   Comes in today. Here with his wife. He notes he is much better. Tells me that since Friday - "he is living a brand new life". Breathing improved. Not dizzy. Not using the Midodrine since BP went too high. Wearing support stockings. Appetite is picked back up and he is eating. He has lost a significant amount of weight. Says he can taste food again. No chest pain. No palpitations. No swelling. He is on iron replacement three times a day. Followed closely by PCP.    Current Outpatient Prescriptions  Medication Sig Dispense Refill  . aspirin 81 MG tablet Take 81 mg by mouth every morning.       . benzonatate (TESSALON PERLES) 100 MG capsule Take 1 capsule (100 mg total) by mouth 3 (three) times daily as needed for cough.  20 capsule  0  . ferrous sulfate 325 (65 FE) MG EC tablet Take 1 tablet (325 mg total) by mouth 3 (three)  times daily with meals.    3  . levofloxacin (LEVAQUIN) 750 MG tablet Take 1 tablet (750 mg total) by mouth daily.  10 tablet  0  . loratadine (CLARITIN) 10 MG tablet Take 10 mg by mouth every morning.       . midodrine (PROAMATINE) 2.5 MG tablet Take 1 tablet (2.5 mg total) by mouth 3 (three) times daily with meals.  90 tablet  1  . Multiple Vitamin (MULTIVITAMIN WITH MINERALS) TABS tablet Take 1 tablet by mouth every morning.       . neomycin-bacitracin-polymyxin (NEOSPORIN) OINT Apply 1 application topically as needed for irritation or wound care.      . NON FORMULARY Place 4 L into the nose daily. 3-4 liters and with activity 6 liters      . OVER THE COUNTER MEDICATION Super beta prostate - take 1 tablet by mouth twice daily      . pantoprazole (PROTONIX) 40 MG tablet Take 40 mg by mouth every morning.       . pravastatin (PRAVACHOL) 40 MG tablet Take 40 mg by mouth every evening.       . predniSONE (DELTASONE) 20 MG tablet Take 20 mg by mouth daily with breakfast.      . saccharomyces boulardii (FLORASTOR) 250 MG capsule Take 1 capsule (250 mg total) by mouth 2 (two) times  daily.  30 capsule  0  . SALINE NASAL MIST NA Place 1 spray into the nose daily as needed (for congestion).      . [DISCONTINUED] tadalafil (CIALIS) 20 MG tablet Take 20 mg by mouth daily as needed. Take one about 1-2 hours before sexual intercourse        No current facility-administered medications for this visit.    Allergies  Allergen Reactions  . Pravastatin Sodium Other (See Comments)    Sneezing episodes, can take medication and tolerates reaction.    Past Medical History  Diagnosis Date  . Hypertension   . Hyperlipidemia   . Aortic stenosis     AVR 2011  . Dermatitis     poison ivy  . Cleft palate   . Interstitial lung disease   . Pulmonary fibrosis   . Syncope and collapse 07/18/2014  . GERD (gastroesophageal reflux disease)     Past Surgical History  Procedure Laterality Date  . Bilat cataract  sx  2004  . Cholecystectomy  1999  . 7 surgeries for cleft palate  1938  . Oral surgery with bone graft from hip for cleft palate  1989  . Right groin hernia repair  1950's  . Rotator cuff tear, left shoulder    . Aortic valve replacement w/ pericardial tissue valve  10-15-09    Dr Barry Dienes    History  Smoking status  . Former Smoker -- 1.50 packs/day for 5 years  . Types: Cigarettes, Cigars  . Quit date: 09/08/1977  Smokeless tobacco  . Never Used    History  Alcohol Use No    Family History  Problem Relation Age of Onset  . Heart attack Mother   . Cancer Father     lung and stomach  . Heart attack Father   . Cancer Sister     bone  . Diabetes Sister   . Heart murmur Brother   . Hypertension Brother   . Hyperlipidemia Brother   . Heart attack Brother     Review of Systems: The review of systems is per the HPI.  All other systems were reviewed and are negative.  Physical Exam: BP 134/82  Pulse 88  Ht 6\' 1"  (1.854 m)  Wt 214 lb 1.9 oz (97.124 kg)  BMI 28.26 kg/m2  SpO2 100% Patient is very pleasant and in no acute distress. Losing weight - down 10 pounds in just a couple of weeks. Speech is a little hard to make out due to his cleft palate but he is understandable. Skin is warm and dry. Color is normal.  HEENT is unremarkable. Normocephalic/atraumatic. PERRL. Sclera are nonicteric. Neck is supple. No masses. No JVD. Lungs are clear. Cardiac exam shows a regular rate and rhythm. Abdomen is soft. Extremities are without edema. Support stockings in place. Gait not tested. No gross neurologic deficits noted.  Wt Readings from Last 3 Encounters:  08/06/13 214 lb 1.9 oz (97.124 kg)  08/03/13 215 lb 11.2 oz (97.841 kg)  08/01/13 227 lb 11.8 oz (103.3 kg)    LABORATORY DATA/PROCEDURES:  Lab Results  Component Value Date   WBC 14.3* 07/31/2013   HGB 8.8* 07/31/2013   HCT 25.8* 07/31/2013   PLT 126* 07/31/2013   GLUCOSE 125* 07/30/2013   CHOL 180 08/07/2012   TRIG 168*  08/07/2012   HDL 49 08/07/2012   LDLCALC 97 08/07/2012   ALT 23 07/28/2013   AST 28 07/28/2013   NA 134* 07/30/2013   K 3.5* 08/01/2013  CL 95* 07/30/2013   CREATININE 0.95 07/30/2013   BUN 21 07/30/2013   CO2 25 07/30/2013   TSH 2.936 06/07/2012   PSA 0.40 06/07/2012   INR 1.18 07/28/2013   HGBA1C  Value: 6.4 (NOTE)                                                                       According to the ADA Clinical Practice Recommendations for 2011, when HbA1c is used as a screening test:   >=6.5%   Diagnostic of Diabetes Mellitus           (if abnormal result  is confirmed)  5.7-6.4%   Increased risk of developing Diabetes Mellitus  References:Diagnosis and Classification of Diabetes Mellitus,Diabetes Care,2011,34(Suppl 1):S62-S69 and Standards of Medical Care in         Diabetes - 2011,Diabetes Care,2011,34  (Suppl 1):S11-S61.* 10/13/2009    BNP (last 3 results)  Recent Labs  05/28/13 1010  PROBNP 141.0*   Echo Study Conclusions from May 2015  - Left ventricle: The cavity size was normal. Wall thickness was normal. Systolic function was normal. The estimated ejection fraction was in the range of 55% to 65%. Wall motion was normal; there were no regional wall motion abnormalities. - Aortic valve: A bioprosthesis was present. - Left atrium: The atrium was mildly dilated. - Right ventricle: The cavity size was mildly dilated. - Right atrium: The atrium was mildly dilated.   Assessment / Plan: 1. Chronic dyspnea/interstitial lung disease/BOOP -  Plan per pulmonary - on chronic oxygen therapy.   2. Orthostatic hypotension -  Sitting BP here is 134/82 and standing is 99/50. He is now asymptomatic. Not on Midodrine. Wife will continue to monitor his readings. May liberalize his salt a little as well. Reminded to "work his feet" prior to standing.   3. PAF -  In sinus today.   4. Chronic anemia -  Defer to PCP.   5. Recent UTI/sepsis  Situation quite complex and prognosis tenuous at best but he is  doing ok - he is happy with how he now feels. See back in 4 months to check on him.  Patient is agreeable to this plan and will call if any problems develop in the interim.   Rosalio Macadamia, RN, ANP-C [facility] [address] [place], [place]  [address] [phone]

## 2013-08-07 ENCOUNTER — Telehealth: Payer: Self-pay | Admitting: Critical Care Medicine

## 2013-08-07 ENCOUNTER — Telehealth: Payer: Self-pay | Admitting: Cardiology

## 2013-08-07 DIAGNOSIS — N390 Urinary tract infection, site not specified: Secondary | ICD-10-CM | POA: Diagnosis not present

## 2013-08-07 DIAGNOSIS — I4891 Unspecified atrial fibrillation: Secondary | ICD-10-CM | POA: Diagnosis not present

## 2013-08-07 DIAGNOSIS — I10 Essential (primary) hypertension: Secondary | ICD-10-CM | POA: Diagnosis not present

## 2013-08-07 DIAGNOSIS — I951 Orthostatic hypotension: Secondary | ICD-10-CM | POA: Diagnosis not present

## 2013-08-07 DIAGNOSIS — J84112 Idiopathic pulmonary fibrosis: Secondary | ICD-10-CM | POA: Diagnosis not present

## 2013-08-07 DIAGNOSIS — D649 Anemia, unspecified: Secondary | ICD-10-CM | POA: Diagnosis not present

## 2013-08-07 NOTE — Telephone Encounter (Signed)
Spoke with bernice, Aware of dr Ludwig Clarkscrenshaw's recommendations.

## 2013-08-07 NOTE — Telephone Encounter (Signed)
I called spoke with Merdis DelayBernice pt home health nurse. She needed to know what level O2 pt is suppose to be on. I advised her of TP last note. Nothing further needed

## 2013-08-07 NOTE — Telephone Encounter (Signed)
Pt has been seen 

## 2013-08-07 NOTE — Telephone Encounter (Signed)
Mainly follow symptoms Increase fluid and salt intake; follow symptoms and orthostatic vitals; send records and we will add midodrine if needed.  Roy MillersBrian Elizabethanne Lusher

## 2013-08-07 NOTE — Telephone Encounter (Signed)
Home RN - [name] called. She would like parameters for patient's orthosatic blood pressure. She states he saw extender yersterday 08/07/13.    Merdis DelayBernice states right arm 142/70 and standing 98/56  - left  160/82 She states she is back to see patient tomorrow and Friday of this week. [name] aware will defer to Dr Jens Somrenshaw

## 2013-08-08 ENCOUNTER — Telehealth: Payer: Self-pay | Admitting: Cardiology

## 2013-08-08 DIAGNOSIS — N390 Urinary tract infection, site not specified: Secondary | ICD-10-CM | POA: Diagnosis not present

## 2013-08-08 DIAGNOSIS — I951 Orthostatic hypotension: Secondary | ICD-10-CM | POA: Diagnosis not present

## 2013-08-08 DIAGNOSIS — I10 Essential (primary) hypertension: Secondary | ICD-10-CM | POA: Diagnosis not present

## 2013-08-08 DIAGNOSIS — D649 Anemia, unspecified: Secondary | ICD-10-CM | POA: Diagnosis not present

## 2013-08-08 DIAGNOSIS — J84112 Idiopathic pulmonary fibrosis: Secondary | ICD-10-CM | POA: Diagnosis not present

## 2013-08-08 DIAGNOSIS — I4891 Unspecified atrial fibrillation: Secondary | ICD-10-CM | POA: Diagnosis not present

## 2013-08-08 NOTE — Telephone Encounter (Signed)
[name] with [facility] called in her visit report.  Sitting BP: 132/72 HR: 80    Standing BP: 74/50 HR: 90 -  no dizziness but felt a little clammy.  Rechecked BP after sitting several minutes still at 74/50 and still no dizziness.  Instructed to eat more salty foods and drink Gatorade.  Also to track input and output. Will visit again Friday.

## 2013-08-08 NOTE — Telephone Encounter (Signed)
Will forward for dr crenshaw review  

## 2013-08-09 DIAGNOSIS — I4891 Unspecified atrial fibrillation: Secondary | ICD-10-CM | POA: Diagnosis not present

## 2013-08-09 DIAGNOSIS — J84112 Idiopathic pulmonary fibrosis: Secondary | ICD-10-CM | POA: Diagnosis not present

## 2013-08-09 DIAGNOSIS — I10 Essential (primary) hypertension: Secondary | ICD-10-CM | POA: Diagnosis not present

## 2013-08-09 DIAGNOSIS — I951 Orthostatic hypotension: Secondary | ICD-10-CM | POA: Diagnosis not present

## 2013-08-09 DIAGNOSIS — D649 Anemia, unspecified: Secondary | ICD-10-CM | POA: Diagnosis not present

## 2013-08-09 DIAGNOSIS — N390 Urinary tract infection, site not specified: Secondary | ICD-10-CM | POA: Diagnosis not present

## 2013-08-10 ENCOUNTER — Telehealth: Payer: Self-pay | Admitting: *Deleted

## 2013-08-10 DIAGNOSIS — J84112 Idiopathic pulmonary fibrosis: Secondary | ICD-10-CM | POA: Diagnosis not present

## 2013-08-10 DIAGNOSIS — D649 Anemia, unspecified: Secondary | ICD-10-CM | POA: Diagnosis not present

## 2013-08-10 DIAGNOSIS — I10 Essential (primary) hypertension: Secondary | ICD-10-CM | POA: Diagnosis not present

## 2013-08-10 DIAGNOSIS — N390 Urinary tract infection, site not specified: Secondary | ICD-10-CM | POA: Diagnosis not present

## 2013-08-10 DIAGNOSIS — I4891 Unspecified atrial fibrillation: Secondary | ICD-10-CM | POA: Diagnosis not present

## 2013-08-10 DIAGNOSIS — I951 Orthostatic hypotension: Secondary | ICD-10-CM | POA: Diagnosis not present

## 2013-08-10 NOTE — Telephone Encounter (Signed)
bp today sitting= 126/66 and standing 88/66. Pt is having no symptoms. They will cont with sodium and fluid intake.

## 2013-08-12 ENCOUNTER — Other Ambulatory Visit: Payer: Self-pay | Admitting: Family Medicine

## 2013-08-14 ENCOUNTER — Encounter: Payer: Self-pay | Admitting: Family Medicine

## 2013-08-14 ENCOUNTER — Other Ambulatory Visit (HOSPITAL_COMMUNITY)
Admission: RE | Admit: 2013-08-14 | Discharge: 2013-08-14 | Disposition: A | Discharge status: Home / Self Care | Payer: Medicare Other | Source: Ambulatory Visit | Attending: Family Medicine | Admitting: Family Medicine

## 2013-08-14 ENCOUNTER — Ambulatory Visit (INDEPENDENT_AMBULATORY_CARE_PROVIDER_SITE_OTHER): Payer: Medicare Other | Admitting: Family Medicine

## 2013-08-14 VITALS — BP 105/68 | HR 104 | Wt 219.0 lb

## 2013-08-14 DIAGNOSIS — K219 Gastro-esophageal reflux disease without esophagitis: Secondary | ICD-10-CM | POA: Diagnosis not present

## 2013-08-14 DIAGNOSIS — I714 Abdominal aortic aneurysm, without rupture, unspecified: Secondary | ICD-10-CM | POA: Diagnosis not present

## 2013-08-14 DIAGNOSIS — I951 Orthostatic hypotension: Secondary | ICD-10-CM | POA: Insufficient documentation

## 2013-08-14 DIAGNOSIS — N3289 Other specified disorders of bladder: Secondary | ICD-10-CM | POA: Insufficient documentation

## 2013-08-14 DIAGNOSIS — N400 Enlarged prostate without lower urinary tract symptoms: Secondary | ICD-10-CM

## 2013-08-14 DIAGNOSIS — D649 Anemia, unspecified: Secondary | ICD-10-CM

## 2013-08-14 DIAGNOSIS — I959 Hypotension, unspecified: Secondary | ICD-10-CM

## 2013-08-14 DIAGNOSIS — R5381 Other malaise: Secondary | ICD-10-CM | POA: Insufficient documentation

## 2013-08-14 DIAGNOSIS — R319 Hematuria, unspecified: Secondary | ICD-10-CM | POA: Diagnosis not present

## 2013-08-14 MED ORDER — PANTOPRAZOLE SODIUM 40 MG PO TBEC: 40.0000 mg | DELAYED_RELEASE_TABLET | Freq: Two times a day (BID) | ORAL | Status: DC

## 2013-08-14 MED ORDER — AMBULATORY NON FORMULARY MEDICATION
Status: AC
Start: 2013-08-14 — End: ?

## 2013-08-14 NOTE — Progress Notes (Signed)
CC: [name] is a 79 y.o. male is here for f/u since seeing cardiologist and pulm   Subjective: HPI:  Hospital followup for urinary tract infection with SIRS which eventually was isolated to Citrobacter was sensitive to levofloxacin which is now finished. Since leaving the hospital earlier in July he states that he is gaining more energy on day by day basis. No longer noticing any blood in his urine ever since started on IV antibiotics while in the hospital. Denies dysuria, polyuria nor urinary frequency. Denies fevers, chills, confusion since discharge.  While in the hospital he was found to have a 3 cm abdominal aortic aneurysm.  He denies any limb claudication recently or remotely. He has followup with vascular surgery in August for further management of this.  He was also found to have a approximately 2 cm bladder mass and a moderately enlarged prostate both of which were seen on a CT scan while hospitalized. He has followup with urology for further workup on this coming up in August. PSAs in the past 3 years have been well below 4. He denies waking at night more than once to urinate.  He does report unintentional weight loss however this is improving ever since his discharge.  Orthostatic hypotension: He's been taking midodrine 2.5 mg only once a day in the mornings. He brings in blood pressure log showing that morning blood pressures have consistently been in systolic 100-110, diastolics in the 60s to 70s. Later on in the day he will have outlined blood pressures as low as low 90s over 60s. Orthostatics are consistently positive in the afternoon and evenings. He states that he gets mild lightheadedness upon standing and feels somewhat off balance for matter of seconds to minutes when going from a seated to standing position in the afternoon and evenings despite increasing sodium in his diet.   Follow GERD: Patient is taking Protonix 40 mg every morning. He states that despite this if he  eats a large meal before going to bed he will get a burning sensation in the back of his throat that seems to be radiating from the epigastric region. He denies any exertional chest pain  Followup anemia: Continues to take iron 3 times a day without constipation. He states that his shortness of breath has improved since oxygen was increased and overall his energy level has improved greatly since I saw him last.  He recently acquired a walker which has helped him be much more mobile around home and community. He denies fall since I saw him last he has physical therapy working with him to 3 times a week at home.   Review Of Systems Outlined In HPI  Past Medical History  Diagnosis Date  . Hypertension   . Hyperlipidemia   . Aortic stenosis     AVR 2011  . Dermatitis     poison ivy  . Cleft palate   . Interstitial lung disease   . Pulmonary fibrosis   . Syncope and collapse 07/18/2014  . GERD (gastroesophageal reflux disease)     Past Surgical History  Procedure Laterality Date  . Bilat cataract sx  2004  . Cholecystectomy  1999  . 7 surgeries for cleft palate  1938  . Oral surgery with bone graft from hip for cleft palate  1989  . Right groin hernia repair  1950's  . Rotator cuff tear, left shoulder    . Aortic valve replacement w/ pericardial tissue valve  10-15-09    Dr Barry Dienes  Family History  Problem Relation Age of Onset  . Heart attack Mother   . Cancer Father     lung and stomach  . Heart attack Father   . Cancer Sister     bone  . Diabetes Sister   . Heart murmur Brother   . Hypertension Brother   . Hyperlipidemia Brother   . Heart attack Brother     History   Social History  . Marital Status: Married    Spouse Name: N/A    Number of Children: N/A  . Years of Education: N/A   Occupational History  . Retired    Social History Main Topics  . Smoking status: Former Smoker -- 1.50 packs/day for 5 years    Types: Cigarettes, Cigars    Quit date: 09/08/1977  .  Smokeless tobacco: Never Used  . Alcohol Use: No  . Drug Use: No  . Sexual Activity: Not on file   Other Topics Concern  . Not on file   Social History Narrative  . No narrative on file     Objective: BP 105/68  Pulse 104  Wt 219 lb (99.338 kg)  SpO2 93%  General: Alert and Oriented, No Acute Distress HEENT: Pupils equal, round, reactive to light. Conjunctivae clear.  Moist membranes pharynx unremarkable Lungs: Comfortable work of breathing on 4 L of oxygen via nasal cannula. No wheezing or rhonchi, mild rales in all lung fields. Cardiac: Regular rate and rhythm. Normal S1/S2.  Grade 1/6 holosystolic murmur in the left intercostal space. No rubs, nor gallops.   Abdomen: Obese and soft Extremities: No peripheral edema.  Strong peripheral pulses.  Mental Status: No depression, anxiety, nor agitation. Skin: Warm and dry.  Assessment & Plan: [name] was seen today for f/u since seeing cardiologist and pulm.  Diagnoses and associated orders for this visit:  Abdominal aortic aneurysm  Bladder mass - Cytology - non pap  Orthostatic hypotension - BASIC METABOLIC PANEL WITH GFR  Gastroesophageal reflux disease, esophagitis presence not specified  Physical deconditioning - pantoprazole (PROTONIX) 40 MG tablet; Take 1 tablet (40 mg total) by mouth 2 (two) times daily before a meal. - AMBULATORY NON FORMULARY MEDICATION; Rollator rolling walker with seat.  Diagnosis Physical Deconditioning 799.3  Enlarged prostate - PSA  Anemia, unspecified anemia type - CBC    Abdominal aortic aneurysm: Advised to keep vascular surgery appointment in August, for the time being we'll try to keep his blood pressure below 140/90. Bladder mass and enlarged prostate: Checking PSA today, send urine off for cytology prior to his urology appointment in August. GERD: Uncontrolled increasing Protonix to now twice a day Anemia: Recheck in hemoglobin not he's been on iron supplementation for a few  weeks. Orthostatic hypotension: Uncontrolled I've asked him to increase his midodrine to twice a day since he seems to be most symptomatic in the afternoons, I've asked him also to provide me with a diary of blood pressures as the end of the week approaches. Checking metabolic panel Physical deconditioning: Prescription provided for rolling walker, advised to continue with PT and OT at home  40 minutes spent face-to-face during visit today of which at least 50% was counseling or coordinating care regarding: 1. Abdominal aortic aneurysm   2. Bladder mass   3. Orthostatic hypotension   4. Gastroesophageal reflux disease, esophagitis presence not specified   5. Physical deconditioning   6. Enlarged prostate   7. Anemia, unspecified anemia type   8. Hypotension, unspecified hypotension type  Return in about 4 weeks (around 09/11/2013).

## 2013-08-15 ENCOUNTER — Ambulatory Visit: Payer: Medicare Other | Admitting: Critical Care Medicine

## 2013-08-15 DIAGNOSIS — I4891 Unspecified atrial fibrillation: Secondary | ICD-10-CM | POA: Diagnosis not present

## 2013-08-15 DIAGNOSIS — N390 Urinary tract infection, site not specified: Secondary | ICD-10-CM | POA: Diagnosis not present

## 2013-08-15 DIAGNOSIS — I10 Essential (primary) hypertension: Secondary | ICD-10-CM | POA: Diagnosis not present

## 2013-08-15 DIAGNOSIS — I951 Orthostatic hypotension: Secondary | ICD-10-CM | POA: Diagnosis not present

## 2013-08-15 DIAGNOSIS — J84112 Idiopathic pulmonary fibrosis: Secondary | ICD-10-CM | POA: Diagnosis not present

## 2013-08-15 DIAGNOSIS — D649 Anemia, unspecified: Secondary | ICD-10-CM | POA: Diagnosis not present

## 2013-08-15 LAB — CBC
HCT: 26 % — ABNORMAL LOW (ref 39.0–52.0)
HEMOGLOBIN: 9 g/dL — AB (ref 13.0–17.0)
MCH: 26.9 pg (ref 26.0–34.0)
MCHC: 34.6 g/dL (ref 30.0–36.0)
MCV: 77.8 fL — AB (ref 78.0–100.0)
Platelets: 191 10*3/uL (ref 150–400)
RBC: 3.34 MIL/uL — ABNORMAL LOW (ref 4.22–5.81)
RDW: 20.2 % — AB (ref 11.5–15.5)
WBC: 9.4 10*3/uL (ref 4.0–10.5)

## 2013-08-15 LAB — BASIC METABOLIC PANEL WITH GFR
BUN: 17 mg/dL (ref 6–23)
CHLORIDE: 100 meq/L (ref 96–112)
CO2: 26 meq/L (ref 19–32)
CREATININE: 0.93 mg/dL (ref 0.50–1.35)
Calcium: 8.3 mg/dL — ABNORMAL LOW (ref 8.4–10.5)
GFR, EST NON AFRICAN AMERICAN: 79 mL/min
GFR, Est African American: >89 mL/min
GLUCOSE: 160 mg/dL — AB (ref 70–99)
POTASSIUM: 5 meq/L (ref 3.5–5.3)
Sodium: 137 mEq/L (ref 135–145)

## 2013-08-15 LAB — PSA: PSA: 0.67 ng/mL (ref <=4.00)

## 2013-08-16 DIAGNOSIS — N390 Urinary tract infection, site not specified: Secondary | ICD-10-CM | POA: Diagnosis not present

## 2013-08-16 DIAGNOSIS — I10 Essential (primary) hypertension: Secondary | ICD-10-CM | POA: Diagnosis not present

## 2013-08-16 DIAGNOSIS — I4891 Unspecified atrial fibrillation: Secondary | ICD-10-CM | POA: Diagnosis not present

## 2013-08-16 DIAGNOSIS — J84112 Idiopathic pulmonary fibrosis: Secondary | ICD-10-CM | POA: Diagnosis not present

## 2013-08-16 DIAGNOSIS — D649 Anemia, unspecified: Secondary | ICD-10-CM | POA: Diagnosis not present

## 2013-08-16 DIAGNOSIS — I951 Orthostatic hypotension: Secondary | ICD-10-CM | POA: Diagnosis not present

## 2013-08-17 ENCOUNTER — Ambulatory Visit (INDEPENDENT_AMBULATORY_CARE_PROVIDER_SITE_OTHER): Payer: Medicare Other | Admitting: Critical Care Medicine

## 2013-08-17 ENCOUNTER — Telehealth: Payer: Self-pay | Admitting: Critical Care Medicine

## 2013-08-17 ENCOUNTER — Encounter: Payer: Self-pay | Admitting: Critical Care Medicine

## 2013-08-17 VITALS — BP 130/72 | HR 76 | Temp 97.8°F | Ht 73.0 in | Wt 220.6 lb

## 2013-08-17 DIAGNOSIS — J8489 Other specified interstitial pulmonary diseases: Secondary | ICD-10-CM

## 2013-08-17 DIAGNOSIS — I951 Orthostatic hypotension: Secondary | ICD-10-CM

## 2013-08-17 DIAGNOSIS — J8409 Other alveolar and parieto-alveolar conditions: Secondary | ICD-10-CM | POA: Diagnosis not present

## 2013-08-17 NOTE — Patient Instructions (Signed)
Stay on prednisone 20mg  per day for 7 more days then 15mg  a day and stay No other changes for now Stay on oxygen 4L rest and 6L exertion Return 6 weeks

## 2013-08-17 NOTE — Telephone Encounter (Signed)
Per PW: we need to call [employer] and ensure they have d/c orders for Esbriet so pt will not receive any further shipments.  Called, spoke with Avni with [employer] at [phone].  Per Avni, pt's case is already closed because there has not been any contact with pt.  Avni states she will make note in chart Esbriet was d/c'd by provider.  Nothing further needed.

## 2013-08-17 NOTE — Progress Notes (Signed)
Subjective:    Patient ID: [name], male    DOB: 1934-05-19, 79 y.o.   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 On chronic O2 at  On Esbriet 03/2013  Pulmonary rehab completed 10/2012 Hx of severe AS s/p AVR , CAD   07/10/2013 Acute OV  Complains of 2 weeks of PW pt here for head congestion w/ green mucus, PND, prod cough with green mucus, loss of appetite, increased SOB, wheezing w/ lying down, some tightness x2 weeks . Called in zpack and pred pack without much improvement.  CXR 07/08/13 w/ chronic changes in ER.  Given tessalon in ER  Patient denies any hemoptysis, orthopnea, PND, or increased leg swelling. >>augmentin and steroid rx   07/2013 Follow up  Patient returns for a post hospital followup. Patient has been admitted twice over the last 2 weeks. Patient was initially admitted June 25, for a, syncopal episode. This was felt to be secondary to orthostatic hypotension. Patient [name] was stopped Do to, potential for dizziness, and increased fall risk. Metoprolol was also held. Patient was re\re admitted July 4 through July 8 for acute urinary tract infection complicated by hypotension and sepsis. Patient was treated with aggressive IV antibiotics, and fluid resuscitation . Patient has been referred to urology to evaluate possible BPH. Patient says since discharge. He continues to be very weak and wears out quickly. Patient has a few days of antibiotics, left in his course. Patient does desaturate with walking on 4 L O2 saturations are adequate at, above 90% on 3 L at rest. Patient was evaluated in the office with adequate saturations on 6 L with ambulation, but for only short distances He is currently on prednisone 30 mg. He denies any hemoptysis, orthopnea, PND, or increased leg swelling.   08/17/2013 Chief Complaint  Patient presents with  . 2 wk follow up    Breathing  has improved with increased o2.  No wheezing or chest tightness/pain.  Pt admitted twice in 07/2013. Now is better, no real cough. No chest pain.  No real wheeze.  Pt now on 4L rest and 6L exertion.  No real mucus. No edema in feet.  No pass out spells or falls. Patient returns for a post hospital followup.  Patient has been admitted twice over the last 2 weeks.  Patient was initially admitted June 25, for a, syncopal episode. This was felt to be secondary to orthostatic hypotension. Patient [name] was stopped Do to, potential for dizziness, and increased fall risk. Metoprolol was also held.  Patient was re\re admitted July 4 through July 8 for acute urinary tract infection complicated by hypotension and sepsis. Patient was treated with aggressive IV antibiotics, and fluid resuscitation . Patient has been referred to urology to evaluate possible BPH.  At 7/10 OV: Remain on Prednisone 30mg  daily through 08/05/13  On Monday 08/06/13 begin Prednisone 20mg  daily -hold at this dose until seen back in office in 2 weeks  Oxygen 3l/m at rest , 6l/m with activity /walking  Use walker to get around and rest frequently while walking  Remain off Esbriet as directed  Pt feels esbriet was an issue.  Not eating. No appt.    Sees urology soon, polyp in bladder  Has been on pred 20/d for one week.   Review of Systems  Constitutional:   No  weight loss, night sweats,  Fevers, chills, + fatigue, or  lassitude.  HEENT:   No headaches,  Difficulty swallowing,  Tooth/dental problems, or  Sore throat,                No sneezing, itching, ear ache, nasal congestion, post nasal drip,   CV:  No chest pain,  Orthopnea, PND, swelling in lower extremities, anasarca, dizziness, palpitations, syncope.   GI  No heartburn, indigestion, abdominal pain, nausea, vomiting, diarrhea, change in bowel habits, loss of appetite, bloody stools.   Resp:  .  No chest wall deformity  Skin: no rash or lesions.  GU: no dysuria,  change in color of urine, no urgency or frequency.  No flank pain, no hematuria   MS:  No joint pain or swelling.  No decreased range of motion.  No back pain.  Psych:  No change in mood or affect. No depression or anxiety.  No memory loss.         Objective:   Physical Exam BP 130/72  Pulse 76  Temp(Src) 97.8 F (36.6 C) (Oral)  Ht 6\' 1"  (1.854 m)  Wt 220 lb 9.6 oz (100.064 kg)  BMI 29.11 kg/m2  SpO2 100%  GEN: A/Ox3; pleasant , NAD, elderly   HEENT:  [place]/AT,  EACs-clear, TMs-wnl, NOSE-clear, THROAT-clear, no lesions, no postnasal drip or exudate noted. Cleft palate repair hx   NECK:  Supple w/ fair ROM; no JVD; normal carotid impulses w/o bruits; no thyromegaly or nodules palpated; no lymphadenopathy.  RESP  Bibasilar crackles .no accessory muscle use, no dullness to percussion  CARD:  RRR, no m/r/g  , tr  peripheral edema, pulses intact, no cyanosis or clubbing.  GI:   Soft & nt; nml bowel sounds; no organomegaly or masses detected.  Musco: Warm bil, no deformities or joint swelling noted.   Neuro: alert, no focal deficits noted.    Skin: Warm, no lesions or rashes         Assessment & Plan:   Hypotension Orthostatic hypotension now resolved This is due to a combination of ongoing antihypertensive use volume depletion. Also use it as. 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 Due to multiple side effects Plan Maintain low-dose corticosteroids Discontinue pirfenidone   Updated Medication List Outpatient Encounter Prescriptions as of 08/17/2013  Medication Sig  . AMBULATORY NON FORMULARY MEDICATION Rollator rolling walker with seat.  Diagnosis Physical Deconditioning 799.3  . aspirin 81 MG  tablet Take 81 mg by mouth every morning.   . ferrous sulfate 325 (65 FE) MG EC tablet Take 1 tablet (325 mg total) by mouth 3 (three) times daily with meals.  Marland Kitchen loratadine (CLARITIN) 10 MG tablet Take 10 mg by mouth every morning.   . midodrine (PROAMATINE) 2.5 MG tablet Take 1 tablet (2.5 mg total) by mouth 2 (two) times daily with a meal.  . Multiple Vitamin (MULTIVITAMIN WITH MINERALS) TABS tablet Take 1 tablet by mouth every morning.   . neomycin-bacitracin-polymyxin (NEOSPORIN) OINT Apply 1 application topically as needed for irritation or wound care.  . NON FORMULARY Place 4 L into the nose daily. 3-4 liters and with activity 6 liters  . OVER THE COUNTER MEDICATION Super beta prostate - take 1 tablet by mouth twice daily  . pantoprazole (PROTONIX) 40 MG tablet Take 1 tablet (40 mg total) by mouth 2 (two) times daily before  a meal.  . pravastatin (PRAVACHOL) 40 MG tablet TAKE 1 TABLET BY MOUTH DAILY AT BEDTIME  . predniSONE (DELTASONE) 20 MG tablet Take 20 mg by mouth daily with breakfast.  . SALINE NASAL MIST NA Place 1 spray into the nose daily as needed (for congestion).  . [DISCONTINUED] saccharomyces boulardii (FLORASTOR) 250 MG capsule Take 1 capsule (250 mg total) by mouth 2 (two) times daily.

## 2013-08-17 NOTE — Assessment & Plan Note (Signed)
Orthostatic hypotension now resolved This is due to a combination of ongoing antihypertensive use volume depletion. Also use it as. Resulted in volume loss because of emesis and decreased oral intake from nausea Plan Discontinue further esbriet No change in medications

## 2013-08-17 NOTE — Assessment & Plan Note (Signed)
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 Due to multiple side effects Plan Maintain low-dose corticosteroids Discontinue pirfenidone

## 2013-08-20 ENCOUNTER — Ambulatory Visit: Payer: Medicare Other | Admitting: Family Medicine

## 2013-08-21 ENCOUNTER — Telehealth: Payer: Self-pay | Admitting: Cardiology

## 2013-08-21 ENCOUNTER — Telehealth: Payer: Self-pay | Admitting: Family Medicine

## 2013-08-21 DIAGNOSIS — I951 Orthostatic hypotension: Secondary | ICD-10-CM | POA: Diagnosis not present

## 2013-08-21 DIAGNOSIS — D649 Anemia, unspecified: Secondary | ICD-10-CM | POA: Diagnosis not present

## 2013-08-21 DIAGNOSIS — I10 Essential (primary) hypertension: Secondary | ICD-10-CM | POA: Diagnosis not present

## 2013-08-21 DIAGNOSIS — J84112 Idiopathic pulmonary fibrosis: Secondary | ICD-10-CM | POA: Diagnosis not present

## 2013-08-21 DIAGNOSIS — I4891 Unspecified atrial fibrillation: Secondary | ICD-10-CM | POA: Diagnosis not present

## 2013-08-21 DIAGNOSIS — N390 Urinary tract infection, site not specified: Secondary | ICD-10-CM | POA: Diagnosis not present

## 2013-08-21 NOTE — Telephone Encounter (Signed)
Merdis DelayBernice wanted to let us know orthostatic for today  Sitting 145/76 pulsels 90 Standing 120/65 pulse 91  not symptomatic  Will defer to Dr Jens Somrenshaw

## 2013-08-21 NOTE — Telephone Encounter (Signed)
Message left on vm .handicapp form placed up front

## 2013-08-21 NOTE — Telephone Encounter (Signed)
Sue LushAndrea, Will you please thank Mr. Elderkin for his blood pressure log, it appears taking the midodrine at breakfast and lunch is working the best.  I've finished his handicap placard application (in your inbox).

## 2013-08-22 NOTE — Telephone Encounter (Signed)
Will cont to monitor. No changes at this time.

## 2013-08-23 DIAGNOSIS — I4891 Unspecified atrial fibrillation: Secondary | ICD-10-CM | POA: Diagnosis not present

## 2013-08-23 DIAGNOSIS — D649 Anemia, unspecified: Secondary | ICD-10-CM | POA: Diagnosis not present

## 2013-08-23 DIAGNOSIS — J84112 Idiopathic pulmonary fibrosis: Secondary | ICD-10-CM | POA: Diagnosis not present

## 2013-08-23 DIAGNOSIS — I10 Essential (primary) hypertension: Secondary | ICD-10-CM | POA: Diagnosis not present

## 2013-08-23 DIAGNOSIS — N390 Urinary tract infection, site not specified: Secondary | ICD-10-CM | POA: Diagnosis not present

## 2013-08-23 DIAGNOSIS — I951 Orthostatic hypotension: Secondary | ICD-10-CM | POA: Diagnosis not present

## 2013-08-24 ENCOUNTER — Telehealth: Payer: Self-pay | Admitting: Cardiology

## 2013-08-24 DIAGNOSIS — N390 Urinary tract infection, site not specified: Secondary | ICD-10-CM | POA: Diagnosis not present

## 2013-08-24 DIAGNOSIS — I4891 Unspecified atrial fibrillation: Secondary | ICD-10-CM | POA: Diagnosis not present

## 2013-08-24 DIAGNOSIS — I951 Orthostatic hypotension: Secondary | ICD-10-CM | POA: Diagnosis not present

## 2013-08-24 DIAGNOSIS — I10 Essential (primary) hypertension: Secondary | ICD-10-CM | POA: Diagnosis not present

## 2013-08-24 DIAGNOSIS — J84112 Idiopathic pulmonary fibrosis: Secondary | ICD-10-CM | POA: Diagnosis not present

## 2013-08-24 DIAGNOSIS — D649 Anemia, unspecified: Secondary | ICD-10-CM | POA: Diagnosis not present

## 2013-08-24 NOTE — Telephone Encounter (Signed)
New message      Calling to give bp readings from today-----sitting 132/75 pulse 80, standing 109/68 pulse 84

## 2013-08-24 NOTE — Telephone Encounter (Signed)
Looks good will cont to monitor. No changes at this time

## 2013-08-28 ENCOUNTER — Other Ambulatory Visit: Payer: Self-pay | Admitting: Urology

## 2013-08-28 DIAGNOSIS — I951 Orthostatic hypotension: Secondary | ICD-10-CM | POA: Diagnosis not present

## 2013-08-28 DIAGNOSIS — I10 Essential (primary) hypertension: Secondary | ICD-10-CM | POA: Diagnosis not present

## 2013-08-28 DIAGNOSIS — D649 Anemia, unspecified: Secondary | ICD-10-CM | POA: Diagnosis not present

## 2013-08-28 DIAGNOSIS — N390 Urinary tract infection, site not specified: Secondary | ICD-10-CM | POA: Diagnosis not present

## 2013-08-28 DIAGNOSIS — J84112 Idiopathic pulmonary fibrosis: Secondary | ICD-10-CM | POA: Diagnosis not present

## 2013-08-28 DIAGNOSIS — I4891 Unspecified atrial fibrillation: Secondary | ICD-10-CM | POA: Diagnosis not present

## 2013-08-29 DIAGNOSIS — I951 Orthostatic hypotension: Secondary | ICD-10-CM | POA: Diagnosis not present

## 2013-08-29 DIAGNOSIS — I10 Essential (primary) hypertension: Secondary | ICD-10-CM | POA: Diagnosis not present

## 2013-08-29 DIAGNOSIS — J84112 Idiopathic pulmonary fibrosis: Secondary | ICD-10-CM | POA: Diagnosis not present

## 2013-08-29 DIAGNOSIS — N401 Enlarged prostate with lower urinary tract symptoms: Secondary | ICD-10-CM | POA: Diagnosis not present

## 2013-08-29 DIAGNOSIS — I4891 Unspecified atrial fibrillation: Secondary | ICD-10-CM | POA: Diagnosis not present

## 2013-08-29 DIAGNOSIS — D649 Anemia, unspecified: Secondary | ICD-10-CM | POA: Diagnosis not present

## 2013-08-29 DIAGNOSIS — N390 Urinary tract infection, site not specified: Secondary | ICD-10-CM | POA: Diagnosis not present

## 2013-08-29 DIAGNOSIS — R310 Gross hematuria: Secondary | ICD-10-CM | POA: Diagnosis not present

## 2013-08-31 ENCOUNTER — Telehealth: Payer: Self-pay | Admitting: Cardiology

## 2013-08-31 DIAGNOSIS — I951 Orthostatic hypotension: Secondary | ICD-10-CM | POA: Diagnosis not present

## 2013-08-31 DIAGNOSIS — I4891 Unspecified atrial fibrillation: Secondary | ICD-10-CM | POA: Diagnosis not present

## 2013-08-31 DIAGNOSIS — D649 Anemia, unspecified: Secondary | ICD-10-CM | POA: Diagnosis not present

## 2013-08-31 DIAGNOSIS — N390 Urinary tract infection, site not specified: Secondary | ICD-10-CM | POA: Diagnosis not present

## 2013-08-31 DIAGNOSIS — I10 Essential (primary) hypertension: Secondary | ICD-10-CM | POA: Diagnosis not present

## 2013-08-31 DIAGNOSIS — J84112 Idiopathic pulmonary fibrosis: Secondary | ICD-10-CM | POA: Diagnosis not present

## 2013-08-31 NOTE — Telephone Encounter (Signed)
Continue present meds Brook Geraci  

## 2013-08-31 NOTE — Telephone Encounter (Signed)
[facility] she was calling to report vital signs.Message sent to Dr.Crenshaw.

## 2013-08-31 NOTE — Telephone Encounter (Signed)
New message   patient was seen today    Vital sign  Sitting  142/76 , pulse 86 , standing 126/74  Pulse 92 no symptoms.     Is there going to be any further changes for patients.

## 2013-09-03 DIAGNOSIS — I10 Essential (primary) hypertension: Secondary | ICD-10-CM | POA: Diagnosis not present

## 2013-09-03 DIAGNOSIS — N390 Urinary tract infection, site not specified: Secondary | ICD-10-CM | POA: Diagnosis not present

## 2013-09-03 DIAGNOSIS — I4891 Unspecified atrial fibrillation: Secondary | ICD-10-CM | POA: Diagnosis not present

## 2013-09-03 DIAGNOSIS — J84112 Idiopathic pulmonary fibrosis: Secondary | ICD-10-CM | POA: Diagnosis not present

## 2013-09-03 DIAGNOSIS — D649 Anemia, unspecified: Secondary | ICD-10-CM | POA: Diagnosis not present

## 2013-09-03 DIAGNOSIS — I951 Orthostatic hypotension: Secondary | ICD-10-CM | POA: Diagnosis not present

## 2013-09-04 ENCOUNTER — Telehealth: Payer: Self-pay | Admitting: Cardiology

## 2013-09-04 DIAGNOSIS — N390 Urinary tract infection, site not specified: Secondary | ICD-10-CM | POA: Diagnosis not present

## 2013-09-04 DIAGNOSIS — D649 Anemia, unspecified: Secondary | ICD-10-CM | POA: Diagnosis not present

## 2013-09-04 DIAGNOSIS — I10 Essential (primary) hypertension: Secondary | ICD-10-CM | POA: Diagnosis not present

## 2013-09-04 DIAGNOSIS — I951 Orthostatic hypotension: Secondary | ICD-10-CM | POA: Diagnosis not present

## 2013-09-04 DIAGNOSIS — J84112 Idiopathic pulmonary fibrosis: Secondary | ICD-10-CM | POA: Diagnosis not present

## 2013-09-04 DIAGNOSIS — I4891 Unspecified atrial fibrillation: Secondary | ICD-10-CM | POA: Diagnosis not present

## 2013-09-04 NOTE — Telephone Encounter (Signed)
New Prob   Calling to report Orthostatic BP readings and requesting discharge for nursing only.  Readings: Sitting 123-77 Pulse 80 Standing 110/65 Pulse 82  Please call.

## 2013-09-04 NOTE — Telephone Encounter (Signed)
RN  In[facility] Information [facility]/Dr Jens Somrenshaw

## 2013-09-06 DIAGNOSIS — D649 Anemia, unspecified: Secondary | ICD-10-CM | POA: Diagnosis not present

## 2013-09-06 DIAGNOSIS — I10 Essential (primary) hypertension: Secondary | ICD-10-CM | POA: Diagnosis not present

## 2013-09-06 DIAGNOSIS — N390 Urinary tract infection, site not specified: Secondary | ICD-10-CM | POA: Diagnosis not present

## 2013-09-06 DIAGNOSIS — J84112 Idiopathic pulmonary fibrosis: Secondary | ICD-10-CM | POA: Diagnosis not present

## 2013-09-06 DIAGNOSIS — I4891 Unspecified atrial fibrillation: Secondary | ICD-10-CM | POA: Diagnosis not present

## 2013-09-06 DIAGNOSIS — I951 Orthostatic hypotension: Secondary | ICD-10-CM | POA: Diagnosis not present

## 2013-09-07 ENCOUNTER — Telehealth: Payer: Self-pay | Admitting: Cardiology

## 2013-09-07 DIAGNOSIS — N390 Urinary tract infection, site not specified: Secondary | ICD-10-CM | POA: Diagnosis not present

## 2013-09-07 DIAGNOSIS — I4891 Unspecified atrial fibrillation: Secondary | ICD-10-CM | POA: Diagnosis not present

## 2013-09-07 DIAGNOSIS — J84112 Idiopathic pulmonary fibrosis: Secondary | ICD-10-CM | POA: Diagnosis not present

## 2013-09-07 DIAGNOSIS — I10 Essential (primary) hypertension: Secondary | ICD-10-CM | POA: Diagnosis not present

## 2013-09-07 DIAGNOSIS — D649 Anemia, unspecified: Secondary | ICD-10-CM | POA: Diagnosis not present

## 2013-09-07 DIAGNOSIS — I951 Orthostatic hypotension: Secondary | ICD-10-CM | POA: Diagnosis not present

## 2013-09-07 NOTE — Telephone Encounter (Signed)
[facility]

## 2013-09-07 NOTE — Telephone Encounter (Signed)
New Prob   Home health nurse is calling to request discharge for pt. States she has been monitoring orthostatic BPs. No recent medication changes. States he is stable at this time and OK for discharge. Please call.

## 2013-09-07 NOTE — Telephone Encounter (Signed)
Left message for [name], okay to discharge from home health.

## 2013-09-10 ENCOUNTER — Encounter: Payer: Self-pay | Admitting: Vascular Surgery

## 2013-09-10 DIAGNOSIS — I10 Essential (primary) hypertension: Secondary | ICD-10-CM | POA: Diagnosis not present

## 2013-09-10 DIAGNOSIS — J84112 Idiopathic pulmonary fibrosis: Secondary | ICD-10-CM | POA: Diagnosis not present

## 2013-09-10 DIAGNOSIS — D649 Anemia, unspecified: Secondary | ICD-10-CM | POA: Diagnosis not present

## 2013-09-10 DIAGNOSIS — I951 Orthostatic hypotension: Secondary | ICD-10-CM | POA: Diagnosis not present

## 2013-09-10 DIAGNOSIS — N390 Urinary tract infection, site not specified: Secondary | ICD-10-CM | POA: Diagnosis not present

## 2013-09-10 DIAGNOSIS — I4891 Unspecified atrial fibrillation: Secondary | ICD-10-CM | POA: Diagnosis not present

## 2013-09-11 ENCOUNTER — Ambulatory Visit (INDEPENDENT_AMBULATORY_CARE_PROVIDER_SITE_OTHER): Payer: Medicare Other | Admitting: Vascular Surgery

## 2013-09-11 ENCOUNTER — Encounter: Payer: Self-pay | Admitting: Vascular Surgery

## 2013-09-11 VITALS — BP 117/57 | HR 72 | Resp 18 | Ht 73.0 in | Wt 218.0 lb

## 2013-09-11 DIAGNOSIS — I714 Abdominal aortic aneurysm, without rupture, unspecified: Secondary | ICD-10-CM

## 2013-09-11 NOTE — Progress Notes (Signed)
Patient name: [name] MRN: [id number] DOB: [birth year] Sex: male   Referred by: Ivan Anchors  Reason for referral:  Chief Complaint  Patient presents with  . New Evaluation    aaa referred by Dr Ivan Anchors    HISTORY OF PRESENT ILLNESS: The patient is a very pleasant 79 year old gentleman with multiple medical problems. He had a recent CT scan to evaluate possible bladder mass. He did have a large prostate up to 7 cm. Also had an incidental finding of a 3 cm infrarenal abdominal aortic aneurysm. He is seen today for further discussion the does not have any family history of this had no prior knowledge of aneurysm. I do have his actual CT scan films were reviewed have a history of aortic valve replacement in 2011. He is on chronic oxygen therapy for severe pulmonary disease  Past Medical History  Diagnosis Date  . Hypertension   . Hyperlipidemia   . Aortic stenosis     AVR 2011  . Dermatitis     poison ivy  . Cleft palate   . Interstitial lung disease   . Pulmonary fibrosis   . Syncope and collapse 07/18/2014  . GERD (gastroesophageal reflux disease)   . AAA (abdominal aortic aneurysm)     Past Surgical History  Procedure Laterality Date  . Bilat cataract sx  2004  . Cholecystectomy  1999  . 7 surgeries for cleft palate  1938  . Oral surgery with bone graft from hip for cleft palate  1989  . Right groin hernia repair  1950's  . Rotator cuff tear, left shoulder    . Aortic valve replacement w/ pericardial tissue valve  10-15-09    Dr Barry Dienes    History   Social History  . Marital Status: Married    Spouse Name: N/A    Number of Children: N/A  . Years of Education: N/A   Occupational History  . Retired    Social History Main Topics  . Smoking status: Former Smoker -- 1.50 packs/day for 5 years    Types: Cigarettes, Cigars    Quit date: 09/08/1977  . Smokeless tobacco: Never Used  . Alcohol Use: No  . Drug Use: No  . Sexual Activity: Not on file   Other  Topics Concern  . Not on file   Social History Narrative  . No narrative on file    Family History  Problem Relation Age of Onset  . Heart attack Mother   . Cancer Father     lung and stomach  . Heart attack Father   . Cancer Sister     bone  . Diabetes Sister   . Heart murmur Brother   . Hypertension Brother   . Hyperlipidemia Brother   . Heart attack Brother     Allergies as of 09/11/2013 - Review Complete 09/11/2013  Allergen Reaction Noted  . Pravastatin sodium Other (See Comments) 07/28/2013  . Pirfenidone Nausea And Vomiting 08/17/2013    Current Outpatient Prescriptions on File Prior to Visit  Medication Sig Dispense Refill  . AMBULATORY NON FORMULARY MEDICATION Rollator rolling walker with seat.  Diagnosis Physical Deconditioning 799.3  1 Units  0  . aspirin 81 MG tablet Take 81 mg by mouth every morning.       . ferrous sulfate 325 (65 FE) MG EC tablet Take 1 tablet (325 mg total) by mouth 3 (three) times daily with meals.    3  . loratadine (CLARITIN) 10 MG tablet Take  10 mg by mouth every morning.       . midodrine (PROAMATINE) 2.5 MG tablet Take 1 tablet (2.5 mg total) by mouth 2 (two) times daily with a meal.  90 tablet  1  . Multiple Vitamin (MULTIVITAMIN WITH MINERALS) TABS tablet Take 1 tablet by mouth every morning.       . neomycin-bacitracin-polymyxin (NEOSPORIN) OINT Apply 1 application topically as needed for irritation or wound care.      . NON FORMULARY Place 4 L into the nose daily. 3-4 liters and with activity 6 liters      . OVER THE COUNTER MEDICATION Super beta prostate - take 1 tablet by mouth twice daily      . pantoprazole (PROTONIX) 40 MG tablet Take 1 tablet (40 mg total) by mouth 2 (two) times daily before a meal.  60 tablet  3  . pravastatin (PRAVACHOL) 40 MG tablet TAKE 1 TABLET BY MOUTH DAILY AT BEDTIME  90 tablet  0  . predniSONE (DELTASONE) 20 MG tablet Take 20 mg by mouth daily with breakfast.      . SALINE NASAL MIST NA Place 1 spray  into the nose daily as needed (for congestion).      . [DISCONTINUED] tadalafil (CIALIS) 20 MG tablet Take 20 mg by mouth daily as needed. Take one about 1-2 hours before sexual intercourse        No current facility-administered medications on file prior to visit.     REVIEW OF SYSTEMS:  Positives indicated with an "X"  CARDIOVASCULAR:  [ ]  chest pain   [ ]  chest pressure   [ ]  palpitations   [x ] orthopnea   [x ] dyspnea on exertion   [ ]  claudication   [ ]  rest pain   [ ]  DVT   [ ]  phlebitis PULMONARY:   [ ]  productive cough   [ ]  asthma   [ ]  wheezing NEUROLOGIC:   [ ]  weakness  [ ]  paresthesias  [ ]  aphasia  [ ]  amaurosis  [ ]  dizziness HEMATOLOGIC:   [ ]  bleeding problems   [ ]  clotting disorders MUSCULOSKELETAL:  [ ]  joint pain   [ ]  joint swelling GASTROINTESTINAL: [ ]   blood in stool  [ ]   hematemesis GENITOURINARY:  [ ]   dysuria  [ ]   hematuria PSYCHIATRIC:  [ ]  history of major depression INTEGUMENTARY:  [ ]  rashes  [ ]  ulcers CONSTITUTIONAL:  [ ]  fever   [ ]  chills  PHYSICAL EXAMINATION:  General: The patient is a well-nourished male, in no acute distress. Vital signs are BP 117/57  Pulse 72  Resp 18  Ht 6\' 1"  (1.854 m)  Wt 218 lb (98.884 kg)  BMI 28.77 kg/m2 Pulmonary: There is a good air exchange bilaterally without wheezing or rales. Abdomen: Soft and non-tender with normal pitch bowel sounds. No aneurysm palpable Musculoskeletal: There are no major deformities.  There is no significant extremity pain. Neurologic: No focal weakness or paresthesias are detected, Skin: There are no ulcer or rashes noted. Psychiatric: The patient has normal affect. Cardiovascular: There is a regular rate and rhythm without significant murmur appreciated. Carotid arteries without bruits bilaterally Pulse status 2+ radial 2+ femoral 2+ popliteal 2+ posterior tibial with no evidence of peripheral aneurysm  CT was reviewed. This does show distal ectasia of his aorta with a maximum  diameter of 3.0 cm. No extension into the iliac arteries  Impression and Plan:  Small infrarenal abdominal aortic aneurysm. A  long discussion with the patient and his family explaining the significance of this. I explained this is a very small aneurysm and it is lifelong likelihood of any problem from this is very small. I have recommended yearly ultrasound surveillance to rule out any progression in size. I did explain symptoms of leaking aneurysm explained this would be extremely unlikely but the critical importance of present immediately to the emergency department should this occur. The understand I will see us again in one year with ultrasound    Daulton Harbaugh [facility] Office: [phone]

## 2013-09-12 DIAGNOSIS — I10 Essential (primary) hypertension: Secondary | ICD-10-CM

## 2013-09-12 DIAGNOSIS — D649 Anemia, unspecified: Secondary | ICD-10-CM

## 2013-09-12 DIAGNOSIS — I951 Orthostatic hypotension: Secondary | ICD-10-CM | POA: Diagnosis not present

## 2013-09-12 DIAGNOSIS — J84112 Idiopathic pulmonary fibrosis: Secondary | ICD-10-CM | POA: Diagnosis not present

## 2013-09-12 DIAGNOSIS — I4891 Unspecified atrial fibrillation: Secondary | ICD-10-CM

## 2013-09-12 DIAGNOSIS — N390 Urinary tract infection, site not specified: Secondary | ICD-10-CM | POA: Diagnosis not present

## 2013-09-12 NOTE — Addendum Note (Signed)
Addended by: Sharee PimpleMCCHESNEY, MARILYN K on: 09/12/2013 09:51 AM   Modules accepted: Orders

## 2013-09-13 DIAGNOSIS — I951 Orthostatic hypotension: Secondary | ICD-10-CM | POA: Diagnosis not present

## 2013-09-13 DIAGNOSIS — I10 Essential (primary) hypertension: Secondary | ICD-10-CM | POA: Diagnosis not present

## 2013-09-13 DIAGNOSIS — D649 Anemia, unspecified: Secondary | ICD-10-CM | POA: Diagnosis not present

## 2013-09-13 DIAGNOSIS — I4891 Unspecified atrial fibrillation: Secondary | ICD-10-CM | POA: Diagnosis not present

## 2013-09-13 DIAGNOSIS — N390 Urinary tract infection, site not specified: Secondary | ICD-10-CM | POA: Diagnosis not present

## 2013-09-13 DIAGNOSIS — J84112 Idiopathic pulmonary fibrosis: Secondary | ICD-10-CM | POA: Diagnosis not present

## 2013-09-15 ENCOUNTER — Other Ambulatory Visit: Payer: Self-pay | Admitting: Adult Health

## 2013-09-17 ENCOUNTER — Other Ambulatory Visit: Payer: Self-pay | Admitting: Family Medicine

## 2013-09-18 ENCOUNTER — Telehealth: Payer: Self-pay | Admitting: *Deleted

## 2013-09-18 NOTE — Telephone Encounter (Signed)
[name] from PT wants to extend PT for pt for an additional  2 x a week for 2 weeks and then d/c to [facility].Called her back at [phone] and left a verbal ok for this

## 2013-09-19 DIAGNOSIS — N390 Urinary tract infection, site not specified: Secondary | ICD-10-CM | POA: Diagnosis not present

## 2013-09-19 DIAGNOSIS — I10 Essential (primary) hypertension: Secondary | ICD-10-CM | POA: Diagnosis not present

## 2013-09-19 DIAGNOSIS — I4891 Unspecified atrial fibrillation: Secondary | ICD-10-CM | POA: Diagnosis not present

## 2013-09-19 DIAGNOSIS — J84112 Idiopathic pulmonary fibrosis: Secondary | ICD-10-CM | POA: Diagnosis not present

## 2013-09-19 DIAGNOSIS — I951 Orthostatic hypotension: Secondary | ICD-10-CM | POA: Diagnosis not present

## 2013-09-19 DIAGNOSIS — D649 Anemia, unspecified: Secondary | ICD-10-CM | POA: Diagnosis not present

## 2013-09-21 ENCOUNTER — Encounter: Payer: Self-pay | Admitting: Family Medicine

## 2013-09-21 DIAGNOSIS — N390 Urinary tract infection, site not specified: Secondary | ICD-10-CM | POA: Diagnosis not present

## 2013-09-21 DIAGNOSIS — D649 Anemia, unspecified: Secondary | ICD-10-CM | POA: Diagnosis not present

## 2013-09-21 DIAGNOSIS — I10 Essential (primary) hypertension: Secondary | ICD-10-CM | POA: Diagnosis not present

## 2013-09-21 DIAGNOSIS — I4891 Unspecified atrial fibrillation: Secondary | ICD-10-CM | POA: Diagnosis not present

## 2013-09-21 DIAGNOSIS — J84112 Idiopathic pulmonary fibrosis: Secondary | ICD-10-CM | POA: Diagnosis not present

## 2013-09-21 DIAGNOSIS — I951 Orthostatic hypotension: Secondary | ICD-10-CM | POA: Diagnosis not present

## 2013-09-22 ENCOUNTER — Telehealth: Payer: Self-pay

## 2013-09-22 NOTE — Telephone Encounter (Signed)
FYI Patient's wife sent an email stating "Please cancel the me to prolog My husband is not on that . It was d/c in hospital in july Thanks Sherrells."- message was copy and pasted to phone note. I have discontinued medication.

## 2013-09-24 DIAGNOSIS — N401 Enlarged prostate with lower urinary tract symptoms: Secondary | ICD-10-CM | POA: Diagnosis not present

## 2013-09-24 DIAGNOSIS — R310 Gross hematuria: Secondary | ICD-10-CM | POA: Diagnosis not present

## 2013-09-25 ENCOUNTER — Encounter: Payer: Self-pay | Admitting: Family Medicine

## 2013-09-25 ENCOUNTER — Other Ambulatory Visit: Payer: Self-pay | Admitting: Family Medicine

## 2013-09-25 DIAGNOSIS — N390 Urinary tract infection, site not specified: Secondary | ICD-10-CM | POA: Diagnosis not present

## 2013-09-25 DIAGNOSIS — J84112 Idiopathic pulmonary fibrosis: Secondary | ICD-10-CM | POA: Diagnosis not present

## 2013-09-25 DIAGNOSIS — I951 Orthostatic hypotension: Secondary | ICD-10-CM | POA: Diagnosis not present

## 2013-09-25 DIAGNOSIS — I10 Essential (primary) hypertension: Secondary | ICD-10-CM | POA: Diagnosis not present

## 2013-09-25 DIAGNOSIS — D649 Anemia, unspecified: Secondary | ICD-10-CM | POA: Diagnosis not present

## 2013-09-25 DIAGNOSIS — I4891 Unspecified atrial fibrillation: Secondary | ICD-10-CM | POA: Diagnosis not present

## 2013-09-28 ENCOUNTER — Encounter: Payer: Self-pay | Admitting: Critical Care Medicine

## 2013-09-28 ENCOUNTER — Ambulatory Visit (INDEPENDENT_AMBULATORY_CARE_PROVIDER_SITE_OTHER): Payer: Medicare Other | Admitting: Critical Care Medicine

## 2013-09-28 VITALS — BP 110/70 | HR 87 | Temp 97.0°F | Ht 73.0 in | Wt 219.2 lb

## 2013-09-28 DIAGNOSIS — I4891 Unspecified atrial fibrillation: Secondary | ICD-10-CM | POA: Diagnosis not present

## 2013-09-28 DIAGNOSIS — I951 Orthostatic hypotension: Secondary | ICD-10-CM | POA: Diagnosis not present

## 2013-09-28 DIAGNOSIS — J8489 Other specified interstitial pulmonary diseases: Secondary | ICD-10-CM

## 2013-09-28 DIAGNOSIS — Z23 Encounter for immunization: Secondary | ICD-10-CM | POA: Diagnosis not present

## 2013-09-28 DIAGNOSIS — J8409 Other alveolar and parieto-alveolar conditions: Secondary | ICD-10-CM | POA: Diagnosis not present

## 2013-09-28 DIAGNOSIS — J84112 Idiopathic pulmonary fibrosis: Secondary | ICD-10-CM | POA: Diagnosis not present

## 2013-09-28 DIAGNOSIS — D649 Anemia, unspecified: Secondary | ICD-10-CM | POA: Diagnosis not present

## 2013-09-28 DIAGNOSIS — I10 Essential (primary) hypertension: Secondary | ICD-10-CM | POA: Diagnosis not present

## 2013-09-28 DIAGNOSIS — N390 Urinary tract infection, site not specified: Secondary | ICD-10-CM | POA: Diagnosis not present

## 2013-09-28 MED ORDER — PREDNISONE 10 MG PO TABS: 10.0000 mg | ORAL_TABLET | Freq: Every day | ORAL | Status: DC

## 2013-09-28 NOTE — Progress Notes (Signed)
Subjective:    Patient ID: [name], male    DOB: 02-05-1934, 79 y.o.   MRN: [id number]  HPI  78 y.o.  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 On chronic O2 at  On Esbriet 03/2013  Pulmonary rehab completed 10/2012 Hx of severe AS s/p AVR , CAD   09/28/2013 Chief Complaint  Patient presents with  . 6 wk follow up    DOE has improved.  No cough or chest tightness/pain.  Pt saw urology and neg cystoscopy.  Dyspnea is better.  Stamina is better.  UTI has cleared  Now not much cough.  No real wheeze.  Not much edema in feet.  Now walking better with PT.  BP is better and less hypotension orthostatic.  No falls.  Now eating better.   Review of Systems  Constitutional:   No  weight loss, night sweats,  Fevers, chills, + fatigue, or  lassitude.  HEENT:   No headaches,  Difficulty swallowing,  Tooth/dental problems, or  Sore throat,                No sneezing, itching, ear ache, nasal congestion, post nasal drip,   CV:  No chest pain,  Orthopnea, PND, swelling in lower extremities, anasarca, dizziness, palpitations, syncope.   GI  No heartburn, indigestion, abdominal pain, nausea, vomiting, diarrhea, change in bowel habits, loss of appetite, bloody stools.   Resp:  .  No chest wall deformity  Skin: no rash or lesions.  GU: no dysuria, change in color of urine, no urgency or frequency.  No flank pain, no hematuria   MS:  No joint pain or swelling.  No decreased range of motion.  No back pain.  Psych:  No change in mood or affect. No depression or anxiety.  No memory loss.       Objective:   Physical Exam BP 110/70  Pulse 87  Temp(Src) 97 F (36.1 C) (Oral)  Ht  (1.854 m)  Wt 219 lb 3.2 oz (99.428 kg)  BMI 28.93 kg/m2  SpO2 91%  GEN: A/Ox3; pleasant , NAD, elderly   HEENT:  /AT,  EACs-clear, TMs-wnl, NOSE-clear, THROAT-clear, no lesions, no postnasal drip or exudate noted. Cleft  palate repair hx   NECK:  Supple w/ fair ROM; no JVD; normal carotid impulses w/o bruits; no thyromegaly or nodules palpated; no lymphadenopathy.  RESP  Bibasilar crackles .no accessory muscle use, no dullness to percussion  CARD:  RRR, no m/r/g  , tr  peripheral edema, pulses intact, no cyanosis or clubbing.  GI:   Soft & nt; nml bowel sounds; no organomegaly or masses detected.  Musco: Warm bil, no deformities or joint swelling noted.   Neuro: alert, no focal deficits noted.    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 AMBULATORY NON FORMULARY MEDICATION Rollator rolling walker with seat.  Diagnosis Physical Deconditioning 799.3  . aspirin 81 MG tablet Take 81 mg by mouth every morning.   . ferrous  sulfate 325 (65 FE) MG EC tablet Take 1 tablet (325 mg total) by mouth 3 (three) times daily with meals.  . finasteride (PROSCAR) 5 MG tablet Take 5 mg by mouth daily.  Marland Kitchen loratadine (CLARITIN) 10 MG tablet Take 10 mg by mouth every morning.   . midodrine (PROAMATINE) 2.5 MG tablet Take 1 tablet (2.5 mg total) by mouth 2 (two) times daily with a meal.  . Multiple Vitamin (MULTIVITAMIN WITH MINERALS) TABS tablet Take 1 tablet by mouth every morning.   . neomycin-bacitracin-polymyxin (NEOSPORIN) OINT Apply 1 application topically as needed for irritation or wound care.  . NON FORMULARY Place 4 L into the nose daily. 3-4 liters and with activity 6 liters  . pantoprazole (PROTONIX) 40 MG tablet Take 1 tablet (40 mg total)  by mouth 2 (two) times daily before a meal.  . pravastatin (PRAVACHOL) 40 MG tablet TAKE 1 TABLET BY MOUTH DAILY AT BEDTIME  . predniSONE (DELTASONE) 10 MG tablet Take 1 tablet (10 mg total) by mouth daily with breakfast.  . SALINE NASAL MIST NA Place 1 spray into the nose daily as needed (for congestion).  . [DISCONTINUED] finasteride (PROSCAR) 5 MG tablet Take 1 tablet (5 mg total) by mouth daily.  . [DISCONTINUED] predniSONE (DELTASONE) 10 MG tablet TAKE 3 TABLETS DAILY UNTIL 7/12. THEN TAKE 2 TABLETS DAILY UNTIL SEEN IN THE OFFICE IN 2 WEEKS.  . [DISCONTINUED] predniSONE (DELTASONE) 10 MG tablet take 15 mg once daily  . [DISCONTINUED] OVER THE COUNTER MEDICATION Super beta prostate - take 1 tablet by mouth twice daily  . [DISCONTINUED] predniSONE (DELTASONE) 20 MG tablet Take 20 mg by mouth daily with breakfast.

## 2013-09-28 NOTE — Assessment & Plan Note (Signed)
Bronchiolitis obliterans organized pneumonia with oxygen dependency no patient ambulating does well on 3-4 L exertion Patient failed pirfenidone secondary to side effects Plan Administer Prevnar 13 flu vaccine Maintain oxygen 3 L rest 3-4 L exertion Reduce prednisone to 10 mg daily and hold

## 2013-09-28 NOTE — Addendum Note (Signed)
Addended by: Gweneth Dimitri D on: 09/28/2013 10:07 AM   Modules accepted: Orders

## 2013-09-28 NOTE — Patient Instructions (Addendum)
Prevnar 13 and Flu vaccine was given Reduce prednisone to  daily Maintain oxygen at :     3  L rest    3-4    L exertion  Return 4 months

## 2013-10-11 ENCOUNTER — Encounter: Payer: Self-pay | Admitting: Critical Care Medicine

## 2013-10-11 DIAGNOSIS — J8489 Other specified interstitial pulmonary diseases: Secondary | ICD-10-CM

## 2013-10-12 ENCOUNTER — Telehealth (HOSPITAL_COMMUNITY): Payer: Self-pay

## 2013-10-12 NOTE — Telephone Encounter (Signed)
I have called and left a message with Reily to inquire about participation in Pulmonary Rehab. Will send letter in mail and follow up.  

## 2013-10-15 ENCOUNTER — Telehealth (HOSPITAL_COMMUNITY): Payer: Self-pay

## 2013-10-15 NOTE — Telephone Encounter (Signed)
Called patient regarding entrance to Pulmonary Rehab.  Patient states that they are interested in attending the program.  [name] is going to verify insurance coverage and follow up.   

## 2013-10-19 ENCOUNTER — Inpatient Hospital Stay (HOSPITAL_COMMUNITY): Admission: RE | Admit: 2013-10-19 | Payer: Medicare Other | Source: Ambulatory Visit

## 2013-10-22 ENCOUNTER — Encounter (HOSPITAL_COMMUNITY): Payer: Self-pay

## 2013-10-22 ENCOUNTER — Encounter (HOSPITAL_COMMUNITY)
Admission: RE | Admit: 2013-10-22 | Discharge: 2013-10-22 | Disposition: A | Discharge status: Home / Self Care | Payer: Medicare Other | Source: Ambulatory Visit | Attending: Critical Care Medicine | Admitting: Critical Care Medicine

## 2013-10-22 ENCOUNTER — Other Ambulatory Visit: Payer: Self-pay | Admitting: Family Medicine

## 2013-10-22 VITALS — BP 150/52 | HR 91 | Resp 18 | Ht 72.0 in | Wt 217.6 lb

## 2013-10-22 DIAGNOSIS — J8410 Pulmonary fibrosis, unspecified: Secondary | ICD-10-CM | POA: Diagnosis not present

## 2013-10-23 NOTE — Progress Notes (Signed)
[name] 79 y.o. male Pulmonary Rehab Orientation Note Patient arrived today in Cardiac and Pulmonary Rehab for orientation to Pulmonary Rehab. [facility] via wheel chair. He does carry portable oxygen. Per pt, he uses oxygen continuously, 4 liters at rest and 6 liters with exertion. Color good, skin warm and dry. Patient is oriented to time and place. Patient's medical history and medications reviewed. Heart rate is normal, breath sounds crackles throughout. Grip strength equal, strong. Distal pulses palpable. No lower extremity edema however patients fingers appear to be swollen. Patient reports he does take medications as prescribed. Patient states he follows a Diabetic diet. The patient reports no specific efforts to gain or lose weight. He has had a significant amount of weight loss during the last year r/t a medication for his pulmonary disease. He states he is no longer on that medication. Patient remains overweight and stated he is wanting to loose more weight "the healthy way." Patient's weight will be monitored closely. Demonstration and practice of PLB using pulse oximeter. Patient able to return demonstration satisfactorily. Safety and hand hygiene in the exercise area reviewed with patient. Patient voices understanding of the information reviewed. Department expectations discussed with patient and achievable goals were set. The patient shows enthusiasm about attending the program and we look forward to working with this nice gentleman. The patient is scheduled for a 6 min walk test on Tuesday Oct 6 at 3:30 and to begin exercise on Thursday Oct 8 at 10:30.   45 minutes was spent on a variety of activities such as assessment of the patient, obtaining baseline data including height, weight, BMI, and grip strength, verifying medical history, allergies, and current medications, and teaching patient strategies for performing tasks with less respiratory effort with  emphasis on pursed lip breathing.

## 2013-10-30 ENCOUNTER — Encounter (HOSPITAL_COMMUNITY)
Admission: RE | Admit: 2013-10-30 | Discharge: 2013-10-30 | Disposition: A | Discharge status: Home / Self Care | Payer: Medicare Other | Source: Ambulatory Visit | Attending: Critical Care Medicine | Admitting: Critical Care Medicine

## 2013-10-30 DIAGNOSIS — Z5189 Encounter for other specified aftercare: Secondary | ICD-10-CM | POA: Diagnosis not present

## 2013-10-30 DIAGNOSIS — J8410 Pulmonary fibrosis, unspecified: Secondary | ICD-10-CM | POA: Diagnosis not present

## 2013-10-30 NOTE — Progress Notes (Signed)
 [name] completed a Six-Minute Walk Test on 10/30/13 . [name] walked 823 feet with 0 breaks.  The patient's lowest oxygen saturation was 94% , highest heart rate was 107 , and highest blood pressure was 140/60. The patient was on 3 liters of oxygen with a nasal cannula.  [name] stated that nothing hindered their walk test.

## 2013-11-01 ENCOUNTER — Encounter (HOSPITAL_COMMUNITY)
Admission: RE | Admit: 2013-11-01 | Discharge: 2013-11-01 | Disposition: A | Discharge status: Home / Self Care | Payer: Medicare Other | Source: Ambulatory Visit | Attending: Critical Care Medicine | Admitting: Critical Care Medicine

## 2013-11-01 DIAGNOSIS — J8410 Pulmonary fibrosis, unspecified: Secondary | ICD-10-CM | POA: Diagnosis not present

## 2013-11-01 DIAGNOSIS — Z5189 Encounter for other specified aftercare: Secondary | ICD-10-CM | POA: Diagnosis not present

## 2013-11-01 NOTE — Progress Notes (Signed)
Today, [name] exercised at [facility]. Service time was from 10:30am to 12:30pm.  The patient exercised for more than 31 minutes performing aerobic, strengthening, and stretching exercises. Oxygen saturation, heart rate, blood pressure, rate of perceived exertion, and shortness of breath were all monitored before, during, and after exercise.  [name] presented with no problems at today's exercise session. Patient attended education with the pharmacist, [facility] [place].   There was no workload change during today's exercise session.  Pre-exercise vitals:   Weight kg: 98.2   Liters of O2: 4   SpO2: 99   HR: 76   BP: 108/62   CBG: na  Exercise vitals:   Highest heartrate:  107   Lowest oxygen saturation: 88% increased to 99% with pursed lip breathing and rest   Highest blood pressure: 114/60   Liters of oxygen: 4 liters  Post-exercise vitals:   SpO2: 98   HR: 75   BP: 106/74   Liters of O2: 4   CBG: na  Dr. Kalman ShanMurali Chan, Medical Director Dr. Gwenlyn PerkingMadera is immediately available during today's Pulmonary Rehab session for [name] on 11/01/13 at 10:30am class time.

## 2013-11-06 ENCOUNTER — Encounter (HOSPITAL_COMMUNITY)
Admission: RE | Admit: 2013-11-06 | Discharge: 2013-11-06 | Disposition: A | Discharge status: Home / Self Care | Payer: Medicare Other | Source: Ambulatory Visit | Attending: Critical Care Medicine | Admitting: Critical Care Medicine

## 2013-11-06 DIAGNOSIS — J8410 Pulmonary fibrosis, unspecified: Secondary | ICD-10-CM | POA: Diagnosis not present

## 2013-11-06 DIAGNOSIS — Z5189 Encounter for other specified aftercare: Secondary | ICD-10-CM | POA: Diagnosis not present

## 2013-11-06 NOTE — Progress Notes (Signed)
Today, [name] exercised at [facility]. Service time was from 10:30am to 12:00pm.  The patient exercised for more than 31 minutes performing aerobic, strengthening, and stretching exercises. Oxygen saturation, heart rate, blood pressure, rate of perceived exertion, and shortness of breath were all monitored before, during, and after exercise.  [name] presented with no problems at today's exercise session.   There was no workload change during today's exercise session.  Pre-exercise vitals:   Weight kg: 98.4   Liters of O2: 4   SpO2: 99   HR: 77   BP: 104/60   CBG: na  Exercise vitals:   Highest heartrate:  97   Lowest oxygen saturation: 93   Highest blood pressure: 128/58   Liters of 02: 4  Post-exercise vitals:   SpO2: 100   HR: 79   BP: 104/70   Liters of O2: 4   CBG: na  [name], Medical Director [name] is immediately available during today's Pulmonary Rehab session for [name] on 11/06/13 at 10:30am class time.

## 2013-11-08 ENCOUNTER — Encounter (HOSPITAL_COMMUNITY)
Admission: RE | Admit: 2013-11-08 | Discharge: 2013-11-08 | Disposition: A | Discharge status: Home / Self Care | Payer: Medicare Other | Source: Ambulatory Visit | Attending: Critical Care Medicine | Admitting: Critical Care Medicine

## 2013-11-08 DIAGNOSIS — Z5189 Encounter for other specified aftercare: Secondary | ICD-10-CM | POA: Diagnosis not present

## 2013-11-08 DIAGNOSIS — J8410 Pulmonary fibrosis, unspecified: Secondary | ICD-10-CM | POA: Diagnosis not present

## 2013-11-08 NOTE — Progress Notes (Signed)
Today, [name] exercised at [facility]. Service time was from 10:30am to 12:30pm.  The patient exercised for more than 31 minutes performing aerobic, strengthening, and stretching exercises. Oxygen saturation, heart rate, blood pressure, rate of perceived exertion, and shortness of breath were all monitored before, during, and after exercise.  [name] presented with no problems at today's exercise session. Patient attended education class today with [name] on Oxygen Safety.  There was no workload change during today's exercise session.  Pre-exercise vitals:   Weight kg: 98.0   Liters of O2: 4   SpO2: 99   HR: 83   BP: 104/54   CBG: na  Exercise vitals:   Highest heartrate:  97   Lowest oxygen saturation: 95   Highest blood pressure: 122/62   Liters of 02: 4  Post-exercise vitals:   SpO2: 100   HR: 84   BP: 120/60   Liters of O2: 4   CBG: na  Dr. Kalman ShanMurali Chan, Medical Director Dr. David Chan is immediately available during today's Pulmonary Rehab session for [name] on 11/08/13 at 10:30am class time.

## 2013-11-09 ENCOUNTER — Other Ambulatory Visit: Payer: Self-pay | Admitting: Family Medicine

## 2013-11-13 ENCOUNTER — Encounter (HOSPITAL_COMMUNITY)
Admission: RE | Admit: 2013-11-13 | Discharge: 2013-11-13 | Disposition: A | Discharge status: Home / Self Care | Payer: Medicare Other | Source: Ambulatory Visit | Attending: Critical Care Medicine | Admitting: Critical Care Medicine

## 2013-11-13 DIAGNOSIS — Z5189 Encounter for other specified aftercare: Secondary | ICD-10-CM | POA: Diagnosis not present

## 2013-11-13 DIAGNOSIS — J8410 Pulmonary fibrosis, unspecified: Secondary | ICD-10-CM | POA: Diagnosis not present

## 2013-11-13 NOTE — Progress Notes (Signed)
Today, Treyson exercised at [facility]. Service time was from 10:30am to 12:15pm.  The patient exercised for more than 31 minutes performing aerobic, strengthening, and stretching exercises. Oxygen saturation, heart rate, blood pressure, rate of perceived exertion, and shortness of breath were all monitored before, during, and after exercise.  [name] presented with no problems at today's exercise session.   There was an increase in workload change during today's exercise session.  Pre-exercise vitals:   Weight kg: 99.2   Liters of O2: 4   SpO2: 100   HR: 82   BP: 96/60   CBG: na  Exercise vitals:   Highest heartrate:  103   Lowest oxygen saturation: 93   Highest blood pressure: 108/60   Liters of 02: 4  Post-exercise vitals:   SpO2: 100   HR: 80   BP: 108/64   Liters of O2: 4   CBG: na  Dr. Kalman ShanMurali Ramaswamy, Medical Director Dr. David StallFeliz-Ortiz is immediately available during today's Pulmonary Rehab session for Guss Bundeobert G Heydt on 11/13/13 at 10:30am class time.

## 2013-11-15 ENCOUNTER — Encounter (HOSPITAL_COMMUNITY)
Admission: RE | Admit: 2013-11-15 | Discharge: 2013-11-15 | Disposition: A | Discharge status: Home / Self Care | Payer: Medicare Other | Source: Ambulatory Visit | Attending: Critical Care Medicine | Admitting: Critical Care Medicine

## 2013-11-15 DIAGNOSIS — J8410 Pulmonary fibrosis, unspecified: Secondary | ICD-10-CM | POA: Diagnosis not present

## 2013-11-15 DIAGNOSIS — Z5189 Encounter for other specified aftercare: Secondary | ICD-10-CM | POA: Diagnosis not present

## 2013-11-15 NOTE — Progress Notes (Signed)
Today, [name] exercised at [facility]. Service time was from 10:30am to 12:30pm.  The patient exercised for more than 31 minutes performing aerobic, strengthening, and stretching exercises. Oxygen saturation, heart rate, blood pressure, rate of perceived exertion, and shortness of breath were all monitored before, during, and after exercise.  [name] presented with no problems at today's exercise session. The patient attended education class today with [facility] regarding Oxygen Use and Delivery Systems.  There was no workload change during today's exercise session.  Pre-exercise vitals:   Weight kg: 98.6   Liters of O2: 4   SpO2: 100   HR: 82   BP: 96/52   CBG: na  Exercise vitals:   Highest heartrate:  97   Lowest oxygen saturation: 96   Highest blood pressure: 122/60   Liters of 02: 4  Post-exercise vitals:   SpO2: 100   HR: 76   BP: 100/60   Liters of O2: 4   CBG: na  Dr. Kalman ShanMurali Chan, Medical Director [name] is immediately available during today's Pulmonary Rehab session for [name] on 11/15/13 at 10:30am class time.

## 2013-11-20 ENCOUNTER — Encounter (HOSPITAL_COMMUNITY)
Admission: RE | Admit: 2013-11-20 | Discharge: 2013-11-20 | Disposition: A | Discharge status: Home / Self Care | Payer: Medicare Other | Source: Ambulatory Visit | Attending: Critical Care Medicine | Admitting: Critical Care Medicine

## 2013-11-20 DIAGNOSIS — Z5189 Encounter for other specified aftercare: Secondary | ICD-10-CM | POA: Diagnosis not present

## 2013-11-20 DIAGNOSIS — J8410 Pulmonary fibrosis, unspecified: Secondary | ICD-10-CM | POA: Diagnosis not present

## 2013-11-20 NOTE — Progress Notes (Signed)
Today, [name] exercised at [facility]. Service time was from 1030 to 1215.  The patient exercised for more than 31 minutes performing aerobic, strengthening, and stretching exercises. Oxygen saturation, heart rate, blood pressure, rate of perceived exertion, and shortness of breath were all monitored before, during, and after exercise. [name] presented with no problems at today's exercise session.   There was no workload change during today's exercise session.  Pre-exercise vitals:   Weight kg: 99.5   Liters of O2: 4   SpO2: 100   HR: 87   BP: 104/54   CBG: NA  Exercise vitals:   Highest heartrate:  104   Lowest oxygen saturation: 91   Highest blood pressure: 124/60   Liters of 02: 4  Post-exercise vitals:   SpO2: 100   HR: 82   BP: 110/60   Liters of O2: 4   CBG: NA [name], Medical Director [name] is immediately available during today's Pulmonary Rehab session for [name] on 11/20/2013 at 1030 class time.

## 2013-11-22 ENCOUNTER — Encounter (HOSPITAL_COMMUNITY)
Admission: RE | Admit: 2013-11-22 | Discharge: 2013-11-22 | Disposition: A | Discharge status: Home / Self Care | Payer: Medicare Other | Source: Ambulatory Visit | Attending: Critical Care Medicine | Admitting: Critical Care Medicine

## 2013-11-22 DIAGNOSIS — J8410 Pulmonary fibrosis, unspecified: Secondary | ICD-10-CM | POA: Diagnosis not present

## 2013-11-22 DIAGNOSIS — Z5189 Encounter for other specified aftercare: Secondary | ICD-10-CM | POA: Diagnosis not present

## 2013-11-22 NOTE — Progress Notes (Signed)
Today, Levoy exercised at [facility]. Service time was from 10:30am to 12:30pm.  The patient exercised for more than 31 minutes performing aerobic, strengthening, and stretching exercises. Oxygen saturation, heart rate, blood pressure, rate of perceived exertion, and shortness of breath were all monitored before, during, and after exercise.  [name] presented with no problems at today's exercise session. The patient attended education class with Theda BelfastBob Hamilton on Advanced Directives.  There was an increase in workload change during today's exercise session.  Pre-exercise vitals:   Weight kg: 98.8   Liters of O2: 4   SpO2: 99   HR: 86   BP: 116/68   CBG: na  Exercise vitals:   Highest heartrate:  101   Lowest oxygen saturation: 96   Highest blood pressure: 124/60   Liters of 02: 4  Post-exercise vitals:   SpO2: 100   HR: 84   BP: 130/76   Liters of O2: 4   CBG: na  Dr. Kalman ShanMurali Ramaswamy, Medical Director Dr. David StallFeliz-Ortiz is immediately available during today's Pulmonary Rehab session for Guss Bundeobert G Vandenbrink on 11/22/13 at 10:30am class time.

## 2013-11-27 ENCOUNTER — Encounter (HOSPITAL_COMMUNITY)
Admission: RE | Admit: 2013-11-27 | Discharge: 2013-11-27 | Disposition: A | Discharge status: Home / Self Care | Payer: Medicare Other | Source: Ambulatory Visit | Attending: Critical Care Medicine | Admitting: Critical Care Medicine

## 2013-11-27 DIAGNOSIS — Z5189 Encounter for other specified aftercare: Secondary | ICD-10-CM | POA: Insufficient documentation

## 2013-11-27 DIAGNOSIS — J8410 Pulmonary fibrosis, unspecified: Secondary | ICD-10-CM | POA: Insufficient documentation

## 2013-11-27 NOTE — Progress Notes (Signed)
Today, [place] exercised at [facility]. Service time was from 10:30am to 12:30pm.  The patient exercised for more than 31 minutes performing aerobic, strengthening, and stretching exercises. Oxygen saturation, heart rate, blood pressure, rate of perceived exertion, and shortness of breath were all monitored before, during, and after exercise.  [name] presented with no problems at today's exercise session.   There was no workload change during today's exercise session.  Pre-exercise vitals: . Weight kg: 98.2 . Liters of O2: 4 . SpO2: 98 . HR: 85 . BP: 104/54 . CBG: na  Exercise vitals: . Highest heartrate:  106 . Lowest oxygen saturation: 93 . Highest blood pressure: 122/56 . Liters of 02: 4  Post-exercise vitals: . SpO2: 99 . HR: 85 . BP: 114/56 . Liters of O2: 4 . CBG: na  Dr. Kalman ShanMurali Ramaswamy, Medical Director Dr. David StallFeliz-Ortiz is immediately available during today's Pulmonary Rehab session for Guss Bundeobert G Barg on 11/27/13 at 10:30am class time.

## 2013-11-27 NOTE — Progress Notes (Signed)
I have reviewed a Home Exercise Prescription with [name] .  [name] is not currently exercising at home.  The patient was advised to walk 2-3 days a week for 20-30 minutes.  [name] and I discussed how to progress their exercise prescription.  The patient stated that their goals were to decrease the amount of oxygen needed and be able to build endurance and stamina.  The patient stated that they understand the exercise prescription.  We reviewed exercise guidelines, target heart rate during exercise, oxygen use, weather, home pulse oximeter, endpoints for exercise, and goals.  Patient is encouraged to come to me with any questions. I will continue to follow up with the patient to assist them with progression and safety.

## 2013-11-29 ENCOUNTER — Encounter (HOSPITAL_COMMUNITY)
Admission: RE | Admit: 2013-11-29 | Discharge: 2013-11-29 | Disposition: A | Discharge status: Home / Self Care | Payer: Medicare Other | Source: Ambulatory Visit | Attending: Critical Care Medicine | Admitting: Critical Care Medicine

## 2013-11-29 DIAGNOSIS — J8410 Pulmonary fibrosis, unspecified: Secondary | ICD-10-CM | POA: Diagnosis not present

## 2013-11-29 DIAGNOSIS — Z5189 Encounter for other specified aftercare: Secondary | ICD-10-CM | POA: Diagnosis not present

## 2013-11-29 NOTE — Progress Notes (Signed)
Today, [name] exercised at [facility]. Service time was from 10:30am to 12:30pm.  The patient exercised for more than 31 minutes performing aerobic, strengthening, and stretching exercises. Oxygen saturation, heart rate, blood pressure, rate of perceived exertion, and shortness of breath were all monitored before, during, and after exercise.  [name] presented with no problems at today's exercise session. Patient attended education with Gardiner FantiEdna Frank on [employer].  There was an increase workload change during today's exercise session.  Pre-exercise vitals: . Weight kg: 97.8 . Liters of O2: 4 . SpO2: 97 . HR: 82 . BP: 112/62 . CBG: na  Exercise vitals: . Highest heartrate:  98 . Lowest oxygen saturation: 94 . Highest blood pressure: 140/58 . Liters of 02: 4  Post-exercise vitals: . SpO2: 100 . HR: 75 . BP: 108/60 . Liters of O2: 4 . CBG: na  Dr. Kalman ShanMurali Chan, Medical Director Dr. Butler Denmarkizwan is immediately available during today's Pulmonary Rehab session for [name] on 11/29/13 at 10:30am class time.

## 2013-12-04 ENCOUNTER — Encounter (HOSPITAL_COMMUNITY)
Admission: RE | Admit: 2013-12-04 | Discharge: 2013-12-04 | Disposition: A | Discharge status: Home / Self Care | Payer: Medicare Other | Source: Ambulatory Visit | Attending: Critical Care Medicine | Admitting: Critical Care Medicine

## 2013-12-04 DIAGNOSIS — J8410 Pulmonary fibrosis, unspecified: Secondary | ICD-10-CM | POA: Diagnosis not present

## 2013-12-04 DIAGNOSIS — Z5189 Encounter for other specified aftercare: Secondary | ICD-10-CM | POA: Diagnosis not present

## 2013-12-04 NOTE — Progress Notes (Signed)
Today, [name] exercised at [facility]. Service time was from 10:30am to 12:15pm.  The patient exercised for more than 31 minutes performing aerobic, strengthening, and stretching exercises. Oxygen saturation, heart rate, blood pressure, rate of perceived exertion, and shortness of breath were all monitored before, during, and after exercise.  [name] presented with no problems at today's exercise session.   There was no workload change during today's exercise session.  Pre-exercise vitals: . Weight kg: 98.9 . Liters of O2: 4 . SpO2: 100 . HR: 78 . BP: 118/64 . CBG: na  Exercise vitals: . Highest heartrate:  100 . Lowest oxygen saturation: 93 . Highest blood pressure: 150/74 . Liters of 02: 4  Post-exercise vitals: . SpO2: 100 . HR: 80 . BP: 110/62 . Liters of O2: 4 . CBG: na  Dr. Kalman ShanMurali Chan, Medical Director [name] is immediately available during today's Pulmonary Rehab session for [name] on 12/04/13 at 10:30am class time.

## 2013-12-05 ENCOUNTER — Encounter: Payer: Self-pay | Admitting: Nurse Practitioner

## 2013-12-05 ENCOUNTER — Ambulatory Visit (INDEPENDENT_AMBULATORY_CARE_PROVIDER_SITE_OTHER): Payer: Medicare Other | Admitting: Nurse Practitioner

## 2013-12-05 VITALS — BP 118/68 | HR 60 | Ht 73.0 in | Wt 217.8 lb

## 2013-12-05 DIAGNOSIS — I259 Chronic ischemic heart disease, unspecified: Secondary | ICD-10-CM

## 2013-12-05 DIAGNOSIS — E785 Hyperlipidemia, unspecified: Secondary | ICD-10-CM

## 2013-12-05 LAB — HEPATIC FUNCTION PANEL
ALT: 12 U/L (ref 0–53)
AST: 17 U/L (ref 0–37)
Albumin: 3.4 g/dL — ABNORMAL LOW (ref 3.5–5.2)
Alkaline Phosphatase: 43 U/L (ref 39–117)
Bilirubin, Direct: 0.1 mg/dL (ref 0.0–0.3)
Total Bilirubin: 0.8 mg/dL (ref 0.2–1.2)
Total Protein: 6.8 g/dL (ref 6.0–8.3)

## 2013-12-05 LAB — CBC
HCT: 31.9 % — ABNORMAL LOW (ref 39.0–52.0)
Hemoglobin: 10.4 g/dL — ABNORMAL LOW (ref 13.0–17.0)
MCHC: 32.5 g/dL (ref 30.0–36.0)
MCV: 82 fl (ref 78.0–100.0)
Platelets: 147 10*3/uL — ABNORMAL LOW (ref 150.0–400.0)
RBC: 3.89 Mil/uL — ABNORMAL LOW (ref 4.22–5.81)
RDW: 17.7 % — ABNORMAL HIGH (ref 11.5–15.5)
WBC: 14.4 10*3/uL — ABNORMAL HIGH (ref 4.0–10.5)

## 2013-12-05 LAB — BASIC METABOLIC PANEL
BUN: 20 mg/dL (ref 6–23)
CO2: 28 mEq/L (ref 19–32)
Calcium: 8.9 mg/dL (ref 8.4–10.5)
Chloride: 102 mEq/L (ref 96–112)
Creatinine, Ser: 1.1 mg/dL (ref 0.4–1.5)
GFR: 72.73 mL/min (ref >60.00)
Glucose, Bld: 99 mg/dL (ref 70–99)
Potassium: 4.1 mEq/L (ref 3.5–5.1)
Sodium: 139 mEq/L (ref 135–145)

## 2013-12-05 LAB — LIPID PANEL
Cholesterol: 172 mg/dL (ref 0–200)
HDL: 41.5 mg/dL (ref >39.00)
LDL Cholesterol: 100 mg/dL — ABNORMAL HIGH (ref 0–99)
NonHDL: 130.5
Total CHOL/HDL Ratio: 4
Triglycerides: 152 mg/dL — ABNORMAL HIGH (ref 0.0–149.0)
VLDL: 30.4 mg/dL (ref 0.0–40.0)

## 2013-12-05 NOTE — Patient Instructions (Addendum)
We will be checking the following labs today BMET, CBC, lipids, HPF  Stay on your current medicines  See Dr. Jens Somrenshaw in May of 2016  Call the [facility] office at [phone] if you have any questions, problems or concerns.

## 2013-12-05 NOTE — Progress Notes (Signed)
[name] Date of Birth: 22-Jan-1935 Medical Record #[id number]  History of Present Illness: [name] "[name]" is seen back today for a 4 month check. Seen for Dr. Jens Somrenshaw. He has had severe AS, s/p AVR in 2011 with a pericardial tissue valve. EF normal by echo from May of 2014. Other issues include chronic DOE, HTN, PAF, AAA - followed by Dr. Arbie CookeyEarly, nonobstructive CAD by cath in 2011, HTN, HLD, chronic anemia, orthostatic hypotension with prior syncope/fall and interstitial lung disease/BOOP - followed by pulmonary & on chronic steroid therapy and oxygen.   Sen last in July after he had been admitted in early July with a syncopal episode, hypotension and then admitted again with a UTI/sepsis. Ended up getting on Midodrine by his PCP but did not start due to BP being back up. ? Difference in his arms. He remained on chronic oxygen at 5 to 6 liters with activity, 3l at rest and 2l at night. Was to see urology as an outpatient for possible bladder mass on ultrasound and confirmed by CT. Noted to have a 3 cm AAA and asked to see vascular surgery as well. On chronic steroid therapy.   When I saw him for follow up - he was much better.   Comes in today. Here alone today. He has been doing well.  He feels like his breathing has improved with pulmonary rehab. He is walking 30 minutes on the days he does not go to rehab. No chest pain. Remains on oxygen. He is fasting today. No passing out. Taking Midodrine low dose BID. He is happy with how he is doing.  Current Outpatient Prescriptions  Medication Sig Dispense Refill  . aspirin 81 MG tablet Take 81 mg by mouth every morning.     . ferrous sulfate 325 (65 FE) MG EC tablet Take 1 tablet (325 mg total) by mouth 3 (three) times daily with meals. (Patient taking differently: Take 325 mg by mouth daily with breakfast. )  3  . finasteride (PROSCAR) 5 MG tablet Take 5 mg by mouth daily.    Marland Kitchen. loratadine (CLARITIN) 10 MG tablet Take 10 mg by mouth  every morning.     . midodrine (PROAMATINE) 2.5 MG tablet Take 1 tablet (2.5 mg total) by mouth 2 (two) times daily with a meal. 90 tablet 1  . Multiple Vitamin (MULTIVITAMIN WITH MINERALS) TABS tablet Take 1 tablet by mouth every morning.     . neomycin-bacitracin-polymyxin (NEOSPORIN) OINT Apply 1 application topically as needed for irritation or wound care.    . NON FORMULARY Place 4 L into the nose daily. 3-4 liters and with activity 6 liters    . pantoprazole (PROTONIX) 40 MG tablet Take 1 tablet (40 mg total) by mouth 2 (two) times daily before a meal. 60 tablet 3  . pravastatin (PRAVACHOL) 40 MG tablet TAKE 1 TABLET BY MOUTH DAILY AT BEDTIME.**NEEDS FOLLOW UP APPOINTMENT** 30 tablet 0  . predniSONE (DELTASONE) 10 MG tablet Take 1 tablet (10 mg total) by mouth daily with breakfast.    . SALINE NASAL MIST NA Place 1 spray into the nose daily as needed (for congestion).    . AMBULATORY NON FORMULARY MEDICATION Rollator rolling walker with seat.  Diagnosis Physical Deconditioning 799.3 1 Units 0  . [DISCONTINUED] tadalafil (CIALIS) 20 MG tablet Take 20 mg by mouth daily as needed. Take one about 1-2 hours before sexual intercourse      No current facility-administered medications for this visit.  Allergies  Allergen Reactions  . Pravastatin Sodium Other (See Comments)    Sneezing episodes, can take medication and tolerates reaction.  . Pirfenidone Nausea And Vomiting    Past Medical History  Diagnosis Date  . Hypertension   . Hyperlipidemia   . Aortic stenosis     AVR 2011  . Dermatitis     poison ivy  . Cleft palate   . Interstitial lung disease   . Pulmonary fibrosis   . Syncope and collapse 07/18/2014  . GERD (gastroesophageal reflux disease)   . AAA (abdominal aortic aneurysm)     Past Surgical History  Procedure Laterality Date  . Bilat cataract sx  2004  . Cholecystectomy  1999  . 7 surgeries for cleft palate  1938  . Oral surgery with bone graft from hip for  cleft palate  1989  . Right groin hernia repair  1950's  . Rotator cuff tear, left shoulder    . Aortic valve replacement w/ pericardial tissue valve  10-15-09    Dr Barry Dienes    History  Smoking status  . Former Smoker -- 1.50 packs/day for 5 years  . Types: Cigarettes, Cigars  . Quit date: 09/08/1977  Smokeless tobacco  . Never Used    History  Alcohol Use No    Family History  Problem Relation Age of Onset  . Heart attack Mother   . Cancer Father     lung and stomach  . Heart attack Father   . Cancer Sister     bone  . Diabetes Sister   . Heart murmur Brother   . Hypertension Brother   . Hyperlipidemia Brother   . Heart attack Brother     Review of Systems: The review of systems is per the HPI.  All other systems were reviewed and are negative.  Physical Exam: BP 118/68 mmHg  Pulse 60  Ht 6\' 1"  (1.854 m)  Wt 217 lb 12.8 oz (98.793 kg)  BMI 28.74 kg/m2  BP 120/80 sitting and 110/80 standing by me. Patient is very pleasant and in no acute distress. Skin is warm and dry. Color is normal.  HEENT is unremarkable but has repaired cleft palate. Normocephalic/atraumatic. PERRL. Sclera are nonicteric. Neck is supple. No masses. No JVD. Lungs are clear. Cardiac exam shows a regular rate and rhythm. Soft outflow murmur.  Abdomen is soft. Extremities are without edema. Gait and ROM are intact. No gross neurologic deficits noted.  Wt Readings from Last 3 Encounters:  12/05/13 217 lb 12.8 oz (98.793 kg)  10/22/13 217 lb 9.5 oz (98.7 kg)  09/28/13 219 lb 3.2 oz (99.428 kg)    LABORATORY DATA/PROCEDURES:  PENDING  Lab Results  Component Value Date   WBC 9.4 08/14/2013   HGB 9.0* 08/14/2013   HCT 26.0* 08/14/2013   PLT 191 08/14/2013   GLUCOSE 160* 08/14/2013   CHOL 180 08/07/2012   TRIG 168* 08/07/2012   HDL 49 08/07/2012   LDLCALC 97 08/07/2012   ALT 23 07/28/2013   AST 28 07/28/2013   NA 137 08/14/2013   K 5.0 08/14/2013   CL 100 08/14/2013   CREATININE 0.93  08/14/2013   BUN 17 08/14/2013   CO2 26 08/14/2013   TSH 2.936 06/07/2012   PSA 0.67 08/14/2013   INR 1.18 07/28/2013   HGBA1C * 10/13/2009    6.4 (NOTE)  According to the ADA Clinical Practice Recommendations for 2011, when HbA1c is used as a screening test:   >=6.5%   Diagnostic of Diabetes Mellitus           (if abnormal result  is confirmed)  5.7-6.4%   Increased risk of developing Diabetes Mellitus  References:Diagnosis and Classification of Diabetes Mellitus,Diabetes Care,2011,34(Suppl 1):S62-S69 and Standards of Medical Care in         Diabetes - 2011,Diabetes Care,2011,34  (Suppl 1):S11-S61.    BNP (last 3 results)  Recent Labs  05/28/13 1010  PROBNP 141.0*      Echo Study Conclusions from May 2015  - Left ventricle: The cavity size was normal. Wall thickness was normal. Systolic function was normal. The estimated ejection fraction was in the range of 55% to 65%. Wall motion was normal; there were no regional wall motion abnormalities. - Aortic valve: A bioprosthesis was present. - Left atrium: The atrium was mildly dilated. - Right ventricle: The cavity size was mildly dilated. - Right atrium: The atrium was mildly dilated.   Assessment / Plan: 1. Chronic dyspnea/interstitial lung disease/BOOP - followed by pulmonary - on chronic oxygen therapy. In pulmonary rehab.   2. Orthostatic hypotension - On low dose Midodrine. Doing well clinically.  3. PAF - In sinus today.   4. Nonobstructive CAD - no symptoms  5. HLD - on statin  6. Prior AVR  He is doing well clinically. No change in his current regimen. Checking labs today. See Dr. Jens Somrenshaw as planned in May.  Patient is agreeable to this plan and will call if any problems develop in the interim.   Rosalio MacadamiaLori C. Harrington Jobe, RN, ANP-C [facility] [address] [place], [place]  [id number] [phone]

## 2013-12-06 ENCOUNTER — Encounter (HOSPITAL_COMMUNITY)
Admission: RE | Admit: 2013-12-06 | Discharge: 2013-12-06 | Disposition: A | Discharge status: Home / Self Care | Payer: Medicare Other | Source: Ambulatory Visit | Attending: Critical Care Medicine | Admitting: Critical Care Medicine

## 2013-12-06 DIAGNOSIS — J8410 Pulmonary fibrosis, unspecified: Secondary | ICD-10-CM | POA: Diagnosis not present

## 2013-12-06 DIAGNOSIS — Z5189 Encounter for other specified aftercare: Secondary | ICD-10-CM | POA: Diagnosis not present

## 2013-12-06 NOTE — Progress Notes (Signed)
[name] 79 y.o. male Nutrition Note Spoke with pt. Pt is overweight. According to pt, "I used to weigh 258 lbs and I'm now around 217 lb." Wt trends in EMR reviewed. Pt wt is down 35 lb over the past year. Pt reports he has been trying to lose wt by "eating smaller portions."  Pt prepares his breakfast and lunch daily. Pt's wife "still works so we eat out 2-3 times a week." at home. There are some ways the pt can make his eating habits healthier. Pt's Rate Your Plate results reviewed with pt. Pt does not avoid salty food "because my doctor told me to eat more salt since my blood pressure was too low." Pt uses canned/ convenience food.  Pt "sometimes" adds salt to food. Pt expressed understanding of the information reviewed. Vitals - 1 value per visit 12/05/2013 10/22/2013 09/28/2013 09/11/2013  Weight (lb) 217.8 217.59 219.2 218   Vitals - 1 value per visit 08/17/2013 08/14/2013 08/06/2013 08/03/2013 08/01/2013  Weight (lb) 220.6 219 214.12 215.7 227.74   Vitals - 1 value per visit 07/29/2013 07/23/2013 07/19/2013 07/18/2013 07/10/2013  Weight (lb)  225  226.4 226.4   Vitals - 1 value per visit 07/08/2013 06/29/2013 05/28/2013 04/23/2013 04/18/2013  Weight (lb) 235 238 242 251 247   Vitals - 1 value per visit 03/27/2013 03/19/2013 02/21/2013 11/10/2012  Weight (lb) 247 246 249.4 252.8   Nutrition Diagnosis ? Food-and nutrition-related knowledge deficit related to lack of exposure to information as related to diagnosis of pulmonary disease ? Overweight related to excessive energy intake as evidenced by a BMI of 28.8 ?  Nutrition Intervention ? Pt's individual nutrition plan and goals reviewed with pt. ? Benefits of adopting healthy eating habits discussed when pt's Rate Your Plate reviewed. ? Pt to attend the Nutrition and Lung Disease class ? Continual client-centered nutrition education by RD, as part of interdisciplinary care. Goal(s) 1. Describe the benefit of including fruits, vegetables, whole  grains, and low-fat dairy products in a healthy meal plan. Monitor and Evaluate progress toward nutrition goal with team.   Mickle PlumbEdna Mallie Linnemann, M.Ed, RD, LDN, CDE 12/06/2013 12:19 PM

## 2013-12-06 NOTE — Progress Notes (Signed)
Today, [name] exercised at [facility]. Service time was from 10:30am to 12:30pm.  The patient exercised for more than 31 minutes performing aerobic, strengthening, and stretching exercises. Oxygen saturation, heart rate, blood pressure, rate of perceived exertion, and shortness of breath were all monitored before, during, and after exercise.  [name] presented with no problems at today's exercise session.  Patient attended education class today with Yvone NeuPortia Payne on Oxygen Use and Safety.  There was no workload change during today's exercise session.  Pre-exercise vitals: . Weight kg: 99.5 . Liters of O2: 4 . SpO2: 100 . HR: 76 . BP: 112/66 . CBG: na  Exercise vitals: . Highest heartrate:  90 . Lowest oxygen saturation: 90 . Highest blood pressure: 138/68 . Liters of 02: 4  Post-exercise vitals: . SpO2: 99 . HR: 77 . BP: 112/64 . Liters of O2: 4 . CBG: na  Dr. Kalman ShanMurali Ramaswamy, Medical Director Dr. Rhona Leavenshiu is immediately available during today's Pulmonary Rehab session for [name] on 12/06/13 at 10:30am class time.

## 2013-12-11 ENCOUNTER — Encounter (HOSPITAL_COMMUNITY)
Admission: RE | Admit: 2013-12-11 | Discharge: 2013-12-11 | Disposition: A | Discharge status: Home / Self Care | Payer: Medicare Other | Source: Ambulatory Visit | Attending: Critical Care Medicine | Admitting: Critical Care Medicine

## 2013-12-11 DIAGNOSIS — Z5189 Encounter for other specified aftercare: Secondary | ICD-10-CM | POA: Diagnosis not present

## 2013-12-11 DIAGNOSIS — J8410 Pulmonary fibrosis, unspecified: Secondary | ICD-10-CM | POA: Diagnosis not present

## 2013-12-11 NOTE — Progress Notes (Addendum)
Today, Yani exercised at [facility]. Service time was from 10:30am to 12:00pm.  The patient exercised for more than 31 minutes performing aerobic, strengthening, and stretching exercises. Oxygen saturation, heart rate, blood pressure, rate of perceived exertion, and shortness of breath were all monitored before, during, and after exercise.  [name] presented with no problems at today's exercise session.   There was an increase in workload change during today's exercise session.  Pre-exercise vitals: . Weight kg: 100.6 . Liters of O2: 4 . SpO2: 100 . HR: 78 . BP: 140/70 . CBG: na  Exercise vitals: . Highest heartrate:  97 . Lowest oxygen saturation: 92 . Highest blood pressure: 142/60 . Liters of 02: 4  Post-exercise vitals: . SpO2: 100 . HR: 86 . BP: 124/54 . Liters of O2: 4 . CBG: na  Dr. Kalman ShanMurali Ramaswamy, Medical Director Dr. Rhona Leavenshiu is immediately available during today's Pulmonary Rehab session for Guss Bundeobert G Rawdon on 12/11/13 at 10:30am class time.

## 2013-12-13 ENCOUNTER — Encounter (HOSPITAL_COMMUNITY)
Admission: RE | Admit: 2013-12-13 | Discharge: 2013-12-13 | Disposition: A | Discharge status: Home / Self Care | Payer: Medicare Other | Source: Ambulatory Visit | Attending: Critical Care Medicine | Admitting: Critical Care Medicine

## 2013-12-13 ENCOUNTER — Other Ambulatory Visit: Payer: Self-pay | Admitting: Family Medicine

## 2013-12-13 DIAGNOSIS — Z5189 Encounter for other specified aftercare: Secondary | ICD-10-CM | POA: Diagnosis not present

## 2013-12-13 DIAGNOSIS — J8410 Pulmonary fibrosis, unspecified: Secondary | ICD-10-CM | POA: Diagnosis not present

## 2013-12-13 NOTE — Progress Notes (Signed)
Today, Dreydon exercised at [facility]. Service time was from 1030 to 1230.  The patient exercised for more than 31 minutes performing aerobic, strengthening, and stretching exercises. Oxygen saturation, heart rate, blood pressure, rate of perceived exertion, and shortness of breath were all monitored before, during, and after exercise. [name] presented with no problems at today's exercise session. [name] also attended an education session on exercise for the pulmonary patient.  There was no workload change during today's exercise session.  Pre-exercise vitals: . Weight kg: 101.0 . Liters of O2: 4L . SpO2: 98 . HR: 82 . BP: 110/60 . CBG: na  Exercise vitals: . Highest heartrate:  97 . Lowest oxygen saturation: 88 increased to 94 with PLB . Highest blood pressure: 120/56 . Liters of 02: 4L  Post-exercise vitals: . SpO2: 100 . HR: 78 . BP: 126/60 . Liters of O2: 4L . CBG: na  Dr. Kalman ShanMurali Ramaswamy, Medical Director Dr. David StallFeliz-Ortiz is immediately available during today's Pulmonary Rehab session for Guss BundeRobert G West on 12/13/2013 at 1030 class time.

## 2013-12-15 ENCOUNTER — Other Ambulatory Visit: Payer: Self-pay | Admitting: Family Medicine

## 2013-12-18 ENCOUNTER — Encounter (HOSPITAL_COMMUNITY)
Admission: RE | Admit: 2013-12-18 | Discharge: 2013-12-18 | Disposition: A | Discharge status: Home / Self Care | Payer: Medicare Other | Source: Ambulatory Visit | Attending: Critical Care Medicine | Admitting: Critical Care Medicine

## 2013-12-18 DIAGNOSIS — J8410 Pulmonary fibrosis, unspecified: Secondary | ICD-10-CM | POA: Diagnosis not present

## 2013-12-18 DIAGNOSIS — Z5189 Encounter for other specified aftercare: Secondary | ICD-10-CM | POA: Diagnosis not present

## 2013-12-18 NOTE — Progress Notes (Signed)
Today, [name] exercised at [facility]. Service time was from 1030 to 1210.  The patient exercised for more than 31 minutes performing aerobic, strengthening, and stretching exercises. Oxygen saturation, heart rate, blood pressure, rate of perceived exertion, and shortness of breath were all monitored before, during, and after exercise. Roy [name] presented with no problems at today's exercise session.   There was no workload change during today's exercise session.  Pre-exercise vitals: . Weight kg: 99.0 . Liters of O2: 4L . SpO2: 100 . HR: 83 . BP: 118/72 . CBG: na  Exercise vitals: . Highest heartrate:  104 . Lowest oxygen saturation: 90 . Highest blood pressure: 148/68 . Liters of 02: 4L  Post-exercise vitals: . SpO2: 99 . HR: 88 . BP: 104/80 . Liters of O2: 4L . CBG: na  [name], Medical Director [name] is immediately available during today's Pulmonary Rehab session for [name] on 12/18/2013 at 1030 class time.

## 2013-12-20 ENCOUNTER — Encounter (HOSPITAL_COMMUNITY): Payer: Medicare Other

## 2013-12-25 ENCOUNTER — Encounter (HOSPITAL_COMMUNITY)
Admission: RE | Admit: 2013-12-25 | Discharge: 2013-12-25 | Disposition: A | Discharge status: Home / Self Care | Payer: Medicare Other | Source: Ambulatory Visit | Attending: Critical Care Medicine | Admitting: Critical Care Medicine

## 2013-12-25 DIAGNOSIS — J8410 Pulmonary fibrosis, unspecified: Secondary | ICD-10-CM | POA: Diagnosis not present

## 2013-12-25 DIAGNOSIS — Z5189 Encounter for other specified aftercare: Secondary | ICD-10-CM | POA: Insufficient documentation

## 2013-12-25 NOTE — Progress Notes (Signed)
Today, [name] exercised at [facility]. Service time was from 10:30am to 12:15pm.  The patient exercised for more than 31 minutes performing aerobic, strengthening, and stretching exercises. Oxygen saturation, heart rate, blood pressure, rate of perceived exertion, and shortness of breath were all monitored before, during, and after exercise.  [name] presented with no problems at today's exercise session.   There was no workload change during today's exercise session.  Pre-exercise vitals: . Weight kg: 99.2 . Liters of O2: 4 . SpO2: 100 . HR: 73 . BP: 120/66 . CBG: na  Exercise vitals: . Highest heartrate:  90 . Lowest oxygen saturation: 94 . Highest blood pressure: 132/60 . Liters of 02: 4  Post-exercise vitals: . SpO2: 98 . HR: 78 . BP: 126/64 . Liters of O2: 4 . CBG: na  Dr. Kalman ShanMurali Chan, Medical Director Dr. Randol KernElgergawy is immediately available during today's Pulmonary Rehab session for [name] on 12/25/13 at 10:30am class time.

## 2013-12-27 ENCOUNTER — Encounter (HOSPITAL_COMMUNITY)
Admission: RE | Admit: 2013-12-27 | Discharge: 2013-12-27 | Disposition: A | Discharge status: Home / Self Care | Payer: Medicare Other | Source: Ambulatory Visit | Attending: Critical Care Medicine | Admitting: Critical Care Medicine

## 2013-12-27 DIAGNOSIS — Z5189 Encounter for other specified aftercare: Secondary | ICD-10-CM | POA: Diagnosis not present

## 2013-12-27 NOTE — Progress Notes (Signed)
Today, [name] exercised at [facility]. Service time was from 1030 to 1225.  The patient exercised for more than 31 minutes performing aerobic, strengthening, and stretching exercises. Oxygen saturation, heart rate, blood pressure, rate of perceived exertion, and shortness of breath were all monitored before, during, and after exercise. [name] presented with no problems at today's exercise session.   There was no workload change during today's exercise session.  Pre-exercise vitals: . Weight kg: 99.7 . Liters of O2: 4L . SpO2: 98 . HR: 75 . BP: 112/60 . CBG: na  Exercise vitals: . Highest heartrate:  93 . Lowest oxygen saturation: 91 . Highest blood pressure: 126/56 . Liters of 02: 4L  Post-exercise vitals: . SpO2: 100 . HR: 71 . BP: 112/72 . Liters of O2: 4L . CBG: na  Dr. Kalman ShanMurali Ramaswamy, Medical Director Dr. Isidoro Donningai is immediately available during today's Pulmonary Rehab session for [name] on 12/27/2013 at 1030 class time.

## 2014-01-01 ENCOUNTER — Encounter (HOSPITAL_COMMUNITY)
Admission: RE | Admit: 2014-01-01 | Discharge: 2014-01-01 | Disposition: A | Discharge status: Home / Self Care | Payer: Medicare Other | Source: Ambulatory Visit | Attending: Critical Care Medicine | Admitting: Critical Care Medicine

## 2014-01-01 DIAGNOSIS — Z5189 Encounter for other specified aftercare: Secondary | ICD-10-CM | POA: Diagnosis not present

## 2014-01-01 NOTE — Progress Notes (Signed)
Today, [name] exercised at [facility]. Service time was from 1030 to 1200.  The patient exercised for more than 31 minutes performing aerobic, strengthening, and stretching exercises. Oxygen saturation, heart rate, blood pressure, rate of perceived exertion, and shortness of breath were all monitored before, during, and after exercise. [name] presented with no problems at today's exercise session.   There was no workload change during today's exercise session.  Pre-exercise vitals: . Weight kg: 99 . Liters of O2: 4L . SpO2: 100 . HR: 71 . BP: 114/54 . CBG: na  Exercise vitals: . Highest heartrate:  95 . Lowest oxygen saturation: 94 . Highest blood pressure: 114/60 . Liters of 02: 4L  Post-exercise vitals: . SpO2: 100 . HR: 72 . BP: 132/70 . Liters of O2: 4L . CBG: na  [name], [name] on 01/01/2014 at 1030 class time.

## 2014-01-03 ENCOUNTER — Encounter (HOSPITAL_COMMUNITY)
Admission: RE | Admit: 2014-01-03 | Discharge: 2014-01-03 | Disposition: A | Discharge status: Home / Self Care | Payer: Medicare Other | Source: Ambulatory Visit | Attending: Critical Care Medicine | Admitting: Critical Care Medicine

## 2014-01-03 DIAGNOSIS — Z5189 Encounter for other specified aftercare: Secondary | ICD-10-CM | POA: Diagnosis not present

## 2014-01-03 NOTE — Progress Notes (Signed)
Today, [name] exercised at [facility]. Service time was from 10:30am to 12:20pm.  The patient exercised for more than 31 minutes performing aerobic, strengthening, and stretching exercises. Oxygen saturation, heart rate, blood pressure, rate of perceived exertion, and shortness of breath were all monitored before, during, and after exercise.  [name] presented with no problems at today's exercise session.   There was no workload change during today's exercise session.  Pre-exercise vitals: . Weight kg: 99.5 . Liters of O2: 4 . SpO2: 94 . HR: 72 . BP: 124/60 . CBG: na  Exercise vitals: . Highest heartrate:  96 . Lowest oxygen saturation: 92 . Highest blood pressure: 138/80 . Liters of 02: 4  Post-exercise vitals: . SpO2: 100 . HR: 75 . BP: 120/74 . Liters of O2: 4 . CBG: na  Dr. Kalman ShanMurali Ramaswamy, Medical Director Dr. Vanessa BarbaraZamora is immediately available during today's Pulmonary Rehab session for [name] on 01/03/14 at 10:30am class time.

## 2014-01-08 ENCOUNTER — Other Ambulatory Visit: Payer: Self-pay | Admitting: Family Medicine

## 2014-01-08 ENCOUNTER — Encounter (HOSPITAL_COMMUNITY)
Admission: RE | Admit: 2014-01-08 | Discharge: 2014-01-08 | Disposition: A | Discharge status: Home / Self Care | Payer: Medicare Other | Source: Ambulatory Visit | Attending: Critical Care Medicine | Admitting: Critical Care Medicine

## 2014-01-08 DIAGNOSIS — Z5189 Encounter for other specified aftercare: Secondary | ICD-10-CM | POA: Diagnosis not present

## 2014-01-08 NOTE — Progress Notes (Signed)
Today, [name] exercised at [facility]. Service time was from 10:30am to 12:15pm.  The patient exercised for more than 31 minutes performing aerobic, strengthening, and stretching exercises. Oxygen saturation, heart rate, blood pressure, rate of perceived exertion, and shortness of breath were all monitored before, during, and after exercise.  [name] presented with no problems at today's exercise session.   There was no workload change during today's exercise session.  Pre-exercise vitals: . Weight kg: 98.4 . Liters of O2: 4 . SpO2: 97 . HR: 82 . BP: 112/52 . CBG: na  Exercise vitals: . Highest heartrate:  90 . Lowest oxygen saturation: 94 . Highest blood pressure: 122/60 . Liters of 02: 4  Post-exercise vitals: . SpO2: 97 . HR: 77 . BP: 112/60 . Liters of O2: 4 . CBG: na  Dr. Kalman ShanMurali Ramaswamy, Medical Director Dr. Vanessa BarbaraZamora is immediately available during today's Pulmonary Rehab session for [name] on 01/08/14 at 10:30am class time.

## 2014-01-10 ENCOUNTER — Encounter (HOSPITAL_COMMUNITY)
Admission: RE | Admit: 2014-01-10 | Discharge: 2014-01-10 | Disposition: A | Discharge status: Home / Self Care | Payer: Medicare Other | Source: Ambulatory Visit | Attending: Critical Care Medicine | Admitting: Critical Care Medicine

## 2014-01-10 DIAGNOSIS — Z5189 Encounter for other specified aftercare: Secondary | ICD-10-CM | POA: Diagnosis not present

## 2014-01-10 NOTE — Progress Notes (Signed)
Today, [name] exercised at [facility]. Service time was from 10:30 to 12:10pm.  The patient exercised for more than 31 minutes performing aerobic, strengthening, and stretching exercises. Oxygen saturation, heart rate, blood pressure, rate of perceived exertion, and shortness of breath were all monitored before, during, and after exercise. [name] presented with no problems at today's exercise session. Patient attended education class on Pursed Lip and Diaphragmatic breathing with [name].  There was no workload change during today's exercise session.  Pre-exercise vitals: . Weight kg: 97.5 . Liters of O2: 4 . SpO2: 99 . HR: 74 . BP: 98/50 . CBG: na  Exercise vitals: . Highest heartrate:  91 . Lowest oxygen saturation: 98 . Highest blood pressure: 114/70 . Liters of 02: 4  Post-exercise vitals: . SpO2: 100 . HR: 79 . BP: 114/60 . Liters of O2: 4 . CBG: na  [name], Medical Director [name] is immediately available during today's Pulmonary Rehab session for [name] on 01/10/14 at 10:30am class time.

## 2014-01-15 ENCOUNTER — Encounter (HOSPITAL_COMMUNITY)
Admission: RE | Admit: 2014-01-15 | Discharge: 2014-01-15 | Disposition: A | Discharge status: Home / Self Care | Payer: Medicare Other | Source: Ambulatory Visit | Attending: Critical Care Medicine | Admitting: Critical Care Medicine

## 2014-01-15 ENCOUNTER — Other Ambulatory Visit: Payer: Self-pay | Admitting: Family Medicine

## 2014-01-15 DIAGNOSIS — Z5189 Encounter for other specified aftercare: Secondary | ICD-10-CM | POA: Diagnosis not present

## 2014-01-15 NOTE — Progress Notes (Signed)
Today, Esiquio exercised at [facility]. Service time was from 1030 to 1200.  The patient exercised for more than 31 minutes performing aerobic, strengthening, and stretching exercises. Oxygen saturation, heart rate, blood pressure, rate of perceived exertion, and shortness of breath were all monitored before, during, and after exercise. [name] presented with no problems at today's exercise session.   There was no workload change during today's exercise session.  Pre-exercise vitals: . Weight kg: 99.4 . Liters of O2: 4 . SpO2: 99 . HR: 87 . BP: 114/60 . CBG: na  Exercise vitals: . Highest heartrate:  99 . Lowest oxygen saturation: 92 . Highest blood pressure: 132/74 . Liters of 02: 4  Post-exercise vitals: . SpO2: 100 . HR: 77 . BP: 114/60 . Liters of O2: 4 . CBG: na Dr. Kalman ShanMurali Ramaswamy, Medical Director Dr. Isidoro Donningai is immediately available during today's Pulmonary Rehab session for Guss BundeRobert G Massing on 01/15/2014 at 1030 class time.  .Marland Kitchen

## 2014-01-16 ENCOUNTER — Encounter: Payer: Self-pay | Admitting: Family Medicine

## 2014-01-16 ENCOUNTER — Ambulatory Visit (INDEPENDENT_AMBULATORY_CARE_PROVIDER_SITE_OTHER): Payer: Medicare Other | Admitting: Family Medicine

## 2014-01-16 VITALS — BP 98/58 | HR 89 | Wt 218.0 lb

## 2014-01-16 DIAGNOSIS — I259 Chronic ischemic heart disease, unspecified: Secondary | ICD-10-CM

## 2014-01-16 DIAGNOSIS — D509 Iron deficiency anemia, unspecified: Secondary | ICD-10-CM

## 2014-01-16 DIAGNOSIS — K219 Gastro-esophageal reflux disease without esophagitis: Secondary | ICD-10-CM

## 2014-01-16 DIAGNOSIS — I951 Orthostatic hypotension: Secondary | ICD-10-CM | POA: Diagnosis not present

## 2014-01-16 DIAGNOSIS — J8489 Other specified interstitial pulmonary diseases: Secondary | ICD-10-CM

## 2014-01-16 DIAGNOSIS — I959 Hypotension, unspecified: Secondary | ICD-10-CM

## 2014-01-16 DIAGNOSIS — J8410 Pulmonary fibrosis, unspecified: Secondary | ICD-10-CM | POA: Diagnosis not present

## 2014-01-16 MED ORDER — PRAVASTATIN SODIUM 40 MG PO TABS: ORAL_TABLET | ORAL | Status: DC

## 2014-01-16 MED ORDER — FINASTERIDE 5 MG PO TABS: 5.0000 mg | ORAL_TABLET | Freq: Every day | ORAL | Status: DC

## 2014-01-16 MED ORDER — PANTOPRAZOLE SODIUM 40 MG PO TBEC: DELAYED_RELEASE_TABLET | ORAL | Status: DC

## 2014-01-16 MED ORDER — MIDODRINE HCL 2.5 MG PO TABS: 2.5000 mg | ORAL_TABLET | Freq: Two times a day (BID) | ORAL | Status: DC

## 2014-01-16 MED ORDER — FERROUS SULFATE 325 (65 FE) MG PO TBEC: 325.0000 mg | DELAYED_RELEASE_TABLET | Freq: Every day | ORAL | Status: DC

## 2014-01-16 MED ORDER — PREDNISONE 10 MG PO TABS: 10.0000 mg | ORAL_TABLET | Freq: Every day | ORAL | Status: DC

## 2014-01-16 NOTE — Progress Notes (Signed)
CC: Roy BundeRobert G Chan is a 79 y.o. male is here for Medication Management   Subjective: HPI:  Follow-up orthostatic hypotension: Continues to take midodrine,  He is currently taking 2.5 mg twice a day. He states that he will get lightheadedness if he gets up quickly after being seated for more than 30 minutes however if he doesn't slowly and remembers that he is going to get lightheaded if he rushes symptoms do not present. The severity of this has not been getting better or worse since I saw him last. He states it does not interfere with his quality of life he's had no falls or close calls.  Follow-up BOOP: Continues to take 10 mg of prednisone on a daily basis. Current pulmonology recommendations are to continue to take this on a daily basis. He gets short of breath if he takes off his oxygen which he is currently using 3 L/m. Testing itself out last week without oxygen taking the trash out and he had shortness of breath within a minute. This resolved after putting oxygen back on. He denies cough or chest discomfort. He gets to cardiopulmonary rehabilitation twice a week and does 30 minutes of walking all other days of the week.  Denies cough, wheezing, nor chest pain.   Requesting refills on Protonix.  He's noticed that his abdominal discomfort has been more sensitive since being on prednisone. Provided he takes Protonix twice a day he denies any epigastric or any other abdominal pain. No difficulty swallowing or regurgitation.   Follow-up anemia: he is requesting a prescription for iron. He had a CBC obtained  One month ago showing improving hemoglobin with a normal MCV.  He still has mild anemia.     Review Of Systems Outlined In HPI  Past Medical History  Diagnosis Date  . Hypertension   . Hyperlipidemia   . Aortic stenosis     AVR 2011  . Dermatitis     poison ivy  . Cleft palate   . Interstitial lung disease   . Pulmonary fibrosis   . Syncope and collapse 07/18/2014  . GERD  (gastroesophageal reflux disease)   . AAA (abdominal aortic aneurysm)     Past Surgical History  Procedure Laterality Date  . Bilat cataract sx  2004  . Cholecystectomy  1999  . 7 surgeries for cleft palate  1938  . Oral surgery with bone graft from hip for cleft palate  1989  . Right groin hernia repair  1950's  . Rotator cuff tear, left shoulder    . Aortic valve replacement w/ pericardial tissue valve  10-15-09    Dr [name]   Family History  Problem Relation Age of Onset  . Heart attack Mother   . Cancer Father     lung and stomach  . Heart attack Father   . Cancer Sister     bone  . Diabetes Sister   . Heart murmur Brother   . Hypertension Brother   . Hyperlipidemia Brother   . Heart attack Brother     History   Social History  . Marital Status: Married    Spouse Name: N/A    Number of Children: N/A  . Years of Education: N/A   Occupational History  . Retired    Social History Main Topics  . Smoking status: Former Smoker -- 1.50 packs/day for 5 years    Types: Cigarettes, Cigars    Quit date: 09/08/1977  . Smokeless tobacco: Never Used  . Alcohol Use: No  .  Drug Use: No  . Sexual Activity: Not on file   Other Topics Concern  . Not on file   Social History Narrative     Objective: BP 98/58 mmHg  Pulse 89  Wt 218 lb (98.884 kg)  SpO2 96%  General: Alert and Oriented, No Acute Distress HEENT: Pupils equal, round, reactive to light. Conjunctivae clear.   Moist mucous membranes pharynx unremarkable Lungs: Clear to auscultation bilaterally.  Surprisingly I do not hear any rales today. No rhonchi nor wheezing. Cardiac: Regular rate and rhythm. Normal S1/S2.   Abdomen: obese and soft Extremities: No peripheral edema.  Strong peripheral pulses.  Mental Status: No depression, anxiety, nor agitation. Skin: Warm and dry.  Assessment & Plan: Roy [name] was seen today for medication management.  Diagnoses and associated orders for this visit:  Orthostatic  hypotension  Interstitial pulmonary fibrosis  Gastroesophageal reflux disease, esophagitis presence not specified  BOOP (bronchiolitis obliterans with organizing pneumonia)  Hypotension, unspecified hypotension type - midodrine (PROAMATINE) 2.5 MG tablet; Take 1 tablet (2.5 mg total) by mouth 2 (two) times daily with a meal.  Anemia, iron deficiency - ferrous sulfate 325 (65 FE) MG EC tablet; Take 1 tablet (325 mg total) by mouth daily with breakfast.  Other Orders - predniSONE (DELTASONE) 10 MG tablet; Take 1 tablet (10 mg total) by mouth daily with breakfast. - Cancel: pantoprazole (PROTONIX) 40 MG tablet;  - finasteride (PROSCAR) 5 MG tablet; Take 1 tablet (5 mg total) by mouth daily. - pravastatin (PRAVACHOL) 40 MG tablet; Take 1 tablet by mouth every night at bedtime - pantoprazole (PROTONIX) 40 MG tablet; TAKE 1 TABLET BY MOUTH TWICE DAILY BEFORE A MEAL    Orthostatic hypotension: Controlled with only 2 doses of midodrine daily. BOOP and interstitial pulmonary fibrosis: Stable, surprisingly I do not hear any rales today on his exam which is reassuring. Continue 10 mg of prednisone pending Recommendations from Dr. Delford Chan Anemia: Improving encouraged to increase iron supplementation to 3 times a day if tolerated from a constipation standpoint GERD: Controlled continue Protonix  25 minutes spent face-to-face during visit today of which at least 50% was counseling or coordinating care regarding: 1. Orthostatic hypotension   2. Interstitial pulmonary fibrosis   3. Gastroesophageal reflux disease, esophagitis presence not specified   4. BOOP (bronchiolitis obliterans with organizing pneumonia)   5. Hypotension, unspecified hypotension type   6. Anemia, iron deficiency      Return in about 3 months (around 04/17/2014) for Blood Pressure Follow Up.

## 2014-01-17 ENCOUNTER — Encounter (HOSPITAL_COMMUNITY)
Admission: RE | Admit: 2014-01-17 | Discharge: 2014-01-17 | Disposition: A | Discharge status: Home / Self Care | Payer: Medicare Other | Source: Ambulatory Visit | Attending: Critical Care Medicine | Admitting: Critical Care Medicine

## 2014-01-17 DIAGNOSIS — Z5189 Encounter for other specified aftercare: Secondary | ICD-10-CM | POA: Diagnosis not present

## 2014-01-17 NOTE — Progress Notes (Signed)
Today, Kewon exercised at [facility]. Service time was from 1015 to 1200.  The patient exercised for more than 31 minutes performing aerobic, strengthening, and stretching exercises. Oxygen saturation, heart rate, blood pressure, rate of perceived exertion, and shortness of breath were all monitored before, during, and after exercise. [name] presented with no problems at today's exercise session.   There was no workload change during today's exercise session.  Pre-exercise vitals: . Weight kg: 99.0 . Liters of O2: 4 . SpO2: 100 . HR: 78 . BP: 122/60 . CBG: na  Exercise vitals: . Highest heartrate:  100 . Lowest oxygen saturation: 91 . Highest blood pressure: 130/70 . Liters of 02: 4  Post-exercise vitals: . SpO2: 100 . HR: 78 . BP: 124/72 . Liters of O2: 4 . CBG: na Dr. Kalman ShanMurali Ramaswamy, Medical Director Dr. Thedore MinsSingh is immediately available during today's Pulmonary Rehab session for Guss BundeRobert G Lindquist on 01/17/2014 at 1030 class time.  .Marland Kitchen

## 2014-01-22 ENCOUNTER — Encounter (HOSPITAL_COMMUNITY)
Admission: RE | Admit: 2014-01-22 | Discharge: 2014-01-22 | Disposition: A | Discharge status: Home / Self Care | Payer: Medicare Other | Source: Ambulatory Visit | Attending: Critical Care Medicine | Admitting: Critical Care Medicine

## 2014-01-22 DIAGNOSIS — Z5189 Encounter for other specified aftercare: Secondary | ICD-10-CM | POA: Diagnosis not present

## 2014-01-22 NOTE — Progress Notes (Signed)
Today, Guage exercised at [facility]. Service time was from 1030 to 1150.  The patient exercised for more than 31 minutes performing aerobic, strengthening, and stretching exercises. Oxygen saturation, heart rate, blood pressure, rate of perceived exertion, and shortness of breath were all monitored before, during, and after exercise. [name] presented with no problems at today's exercise session.   There was one workload change during today's exercise session.  Pre-exercise vitals: . Weight kg: 95.9 . Liters of O2: 4 . SpO2: 100 . HR: 77 . BP: 116/60 . CBG: na  Exercise vitals: . Highest heartrate:  102 . Lowest oxygen saturation: 91 . Highest blood pressure: 124/60 . Liters of 02: 4  Post-exercise vitals: . SpO2: 100 . HR: 87 . BP: 114/56 . Liters of O2: 4 . CBG: na [name], Medical Director [name] is immediately available during today's Pulmonary Rehab session for [name] on 01/22/2014 at 1030 class time.  .Marland Kitchen

## 2014-01-24 ENCOUNTER — Encounter (HOSPITAL_COMMUNITY)
Admission: RE | Admit: 2014-01-24 | Discharge: 2014-01-24 | Disposition: A | Discharge status: Home / Self Care | Payer: Medicare Other | Source: Ambulatory Visit | Attending: Critical Care Medicine | Admitting: Critical Care Medicine

## 2014-01-24 DIAGNOSIS — Z5189 Encounter for other specified aftercare: Secondary | ICD-10-CM | POA: Diagnosis not present

## 2014-01-24 NOTE — Progress Notes (Signed)
 [name] completed a Six-Minute Walk Test on 01/24/14 . Arvid walked 1,245 feet with 0 breaks.  The patient's lowest oxygen saturation was 96% , highest heart rate was 90 , and highest blood pressure was 142/74. The patient was on 4 liters of oxygen with a nasal cannula.  [name] stated that nothing hindered his walk test.

## 2014-01-29 ENCOUNTER — Encounter (HOSPITAL_COMMUNITY): Payer: Medicare Other

## 2014-01-31 ENCOUNTER — Encounter (HOSPITAL_COMMUNITY): Payer: Medicare Other

## 2014-01-31 NOTE — Progress Notes (Signed)
Pulmonary Rehabilitation Discharge Note: Mr. Nicolson has been discharged from pulmonary rehab after successfully completing 24 exercise/education sessions. He met his personal goals of increasing his stamina and strength and re-establishing an exercise plan. Mr. Schweigert plans to return to an exercise program at [facility]. While participating in pulmonary rehab, Mr. Paulos increased his stamina as evidenced by his ability to increase footage walked on a 6 min walk test. On his initial walk test, Mr. Tarbet walked 823 feet. On his post walk test, he walked 1,245 feet. Mr. Mahaney's discharge PHQ2 was 0.

## 2014-01-31 NOTE — Addendum Note (Signed)
Encounter addended by: Tina GriffithsPortia English Emalee Knies, RN on: 01/31/2014  8:14 AM<BR>     Documentation filed: Notes Section

## 2014-02-05 ENCOUNTER — Encounter (HOSPITAL_COMMUNITY): Payer: Medicare Other

## 2014-02-06 ENCOUNTER — Ambulatory Visit (INDEPENDENT_AMBULATORY_CARE_PROVIDER_SITE_OTHER): Payer: Medicare Other | Admitting: Critical Care Medicine

## 2014-02-06 ENCOUNTER — Encounter: Payer: Self-pay | Admitting: Critical Care Medicine

## 2014-02-06 VITALS — BP 130/80 | HR 74 | Temp 98.0°F | Ht 73.0 in | Wt 216.8 lb

## 2014-02-06 DIAGNOSIS — J8489 Other specified interstitial pulmonary diseases: Secondary | ICD-10-CM | POA: Diagnosis not present

## 2014-02-06 NOTE — Patient Instructions (Signed)
Oxygen is 3L continuous or pulse rest, 4L pulse or 3L continuous exertion, 3L continuous bedtime  4-6L in the shower No medication changes Return 4 months

## 2014-02-06 NOTE — Progress Notes (Signed)
Subjective:    Patient ID: [name], male    DOB: March 16, 1934, 80 y.o.   MRN: [id number]  HPI 80 y.o.  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 On chronic O2 at  [facility] 03/2013  Pulmonary rehab completed 10/2012 Hx of severe AS s/p AVR , CAD   02/06/2014 Chief Complaint  Patient presents with  . 4 month follow up    Breathing doing well overall.  Soreness in center of chest - relates this to recent fall.  No wheezing or cough.  Doing well, no issues. Pt denies any significant sore throat, nasal congestion or excess secretions, fever, chills, sweats, unintended weight loss, pleurtic or exertional chest pain, orthopnea PND, or leg swelling Pt denies any increase in rescue therapy over baseline, denies waking up needing it or having any early am or nocturnal exacerbations of coughing/wheezing/or dyspnea. Pt also denies any obvious fluctuation in symptoms with  weather or environmental change or other alleviating or aggravating factors     Review of Systems Constitutional:   No  weight loss, night sweats,  Fevers, chills, + fatigue, or  lassitude.  HEENT:   No headaches,  Difficulty swallowing,  Tooth/dental problems, or  Sore throat,                No sneezing, itching, ear ache, nasal congestion, post nasal drip,   CV:  No chest pain,  Orthopnea, PND, swelling in lower extremities, anasarca, dizziness, palpitations, syncope.   GI  No heartburn, indigestion, abdominal pain, nausea, vomiting, diarrhea, change in bowel habits, loss of appetite, bloody stools.   Resp:  .  No chest wall deformity  Skin: no rash or lesions.  GU: no dysuria, change in color of urine, no urgency or frequency.  No flank pain, no hematuria   MS:  No joint pain or swelling.  No decreased range of motion.  No back pain.  Psych:  No change in mood or affect. No depression or anxiety.  No memory loss.       Objective:   Physical ExamBP 130/80 mmHg  Pulse 74  Temp(Src) 98 F (36.7 C) (Oral)  Ht 6\' 1"  (1.854 m)  Wt 216 lb 12.8 oz (98.34 kg)  BMI 28.61 kg/m2  SpO2 97%  GEN: A/Ox3; pleasant , NAD, elderly   HEENT:  [place]/AT,  EACs-clear, TMs-wnl, NOSE-clear, THROAT-clear, no lesions, no postnasal drip or exudate noted. Cleft palate repair hx   NECK:  Supple w/ fair ROM; no JVD; normal carotid impulses w/o bruits; no thyromegaly or nodules palpated; no lymphadenopathy.  RESP  Bibasilar crackles .no accessory muscle use, no dullness to percussion  CARD:  RRR, no m/r/g  , tr  peripheral edema, pulses intact, no cyanosis or clubbing.  GI:   Soft & nt; nml bowel sounds; no organomegaly or masses detected.  Musco: Warm bil, no deformities or joint swelling noted.   Neuro: alert, no focal deficits noted.    Skin: Warm, no lesions or rashes         Assessment & Plan:   BOOP (bronchiolitis obliterans with organizing pneumonia) Boop with ILD improved on steroids.  Adverse reaction to steroids Plan Cont low dose prednisone Cont oxygen with exertion       Updated Medication List Outpatient Encounter Prescriptions as of 02/06/2014  Medication Sig  . AMBULATORY NON FORMULARY MEDICATION Rollator rolling walker with seat.  Diagnosis Physical Deconditioning 799.3  . aspirin 81 MG tablet  Take 81 mg by mouth every morning.   . ferrous sulfate 325 (65 FE) MG EC tablet Take 1 tablet (325 mg total) by mouth daily with breakfast.  . finasteride (PROSCAR) 5 MG tablet Take 1 tablet (5 mg total) by mouth daily.  Marland Kitchen loratadine (CLARITIN) 10 MG tablet Take 10 mg by mouth every morning.   . midodrine (PROAMATINE) 2.5 MG tablet Take 1 tablet (2.5 mg total) by mouth 2 (two) times daily with a meal.  . Multiple Vitamin (MULTIVITAMIN WITH MINERALS) TABS tablet Take 1 tablet by mouth every morning.   . NON FORMULARY Place 4 L into the nose daily. 3-4 liters and with activity 6 liters  . pantoprazole (PROTONIX) 40 MG  tablet TAKE 1 TABLET BY MOUTH TWICE DAILY BEFORE A MEAL  . pravastatin (PRAVACHOL) 40 MG tablet Take 1 tablet by mouth every night at bedtime  . predniSONE (DELTASONE) 10 MG tablet Take 1 tablet (10 mg total) by mouth daily with breakfast.

## 2014-02-07 ENCOUNTER — Encounter (HOSPITAL_COMMUNITY): Payer: Medicare Other

## 2014-02-07 NOTE — Assessment & Plan Note (Signed)
Boop with ILD improved on steroids.  Adverse reaction to steroids Plan Cont low dose prednisone Cont oxygen with exertion

## 2014-02-12 ENCOUNTER — Encounter (HOSPITAL_COMMUNITY): Payer: Medicare Other

## 2014-02-14 ENCOUNTER — Encounter (HOSPITAL_COMMUNITY): Payer: Medicare Other

## 2014-02-19 ENCOUNTER — Encounter (HOSPITAL_COMMUNITY): Payer: Medicare Other

## 2014-02-21 ENCOUNTER — Encounter (HOSPITAL_COMMUNITY): Payer: Medicare Other

## 2014-02-26 ENCOUNTER — Encounter (HOSPITAL_COMMUNITY): Payer: Medicare Other

## 2014-03-18 ENCOUNTER — Telehealth: Payer: Self-pay | Admitting: Critical Care Medicine

## 2014-03-18 MED ORDER — PREDNISONE 20 MG PO TABS: 20.0000 mg | ORAL_TABLET | Freq: Every day | ORAL | Status: DC

## 2014-03-18 NOTE — Telephone Encounter (Signed)
Called and spoke to pt's wife, Jan. Informed Jan of the recs per PW. Rx sent to preferred pharmacy. Pt verbalized understanding and denied any further questions or concerns at this time.

## 2014-03-18 NOTE — Telephone Encounter (Signed)
Increase prednisone 20mg  per day for 7days then reduce to 10mg  daily OV if unimproving , suggest with TP

## 2014-03-18 NOTE — Telephone Encounter (Signed)
Spoke with pt's wife, states that she (the wife) has had a lot of chest congestion and nonprod cough.  Pt started having a nonprod cough yesterday.  Pt's wife is concerned that he will catch whatever she is fighting off, and is requesting recs.  Particularly, she wants to know if pt needs to increase his prednisone as a precautionary measure.  PT uses [facility] on [address] in [place].  Dr. Delford FieldWright please advise.  Thank you.

## 2014-03-24 ENCOUNTER — Emergency Department (HOSPITAL_BASED_OUTPATIENT_CLINIC_OR_DEPARTMENT_OTHER): Payer: Medicare Other

## 2014-03-24 ENCOUNTER — Emergency Department (HOSPITAL_BASED_OUTPATIENT_CLINIC_OR_DEPARTMENT_OTHER)
Admission: EM | Admit: 2014-03-24 | Discharge: 2014-03-24 | Disposition: A | Discharge status: Home / Self Care | Payer: Medicare Other | Attending: Emergency Medicine | Admitting: Emergency Medicine

## 2014-03-24 DIAGNOSIS — K449 Diaphragmatic hernia without obstruction or gangrene: Secondary | ICD-10-CM | POA: Diagnosis not present

## 2014-03-24 DIAGNOSIS — J984 Other disorders of lung: Secondary | ICD-10-CM | POA: Diagnosis not present

## 2014-03-24 DIAGNOSIS — J209 Acute bronchitis, unspecified: Secondary | ICD-10-CM | POA: Insufficient documentation

## 2014-03-24 DIAGNOSIS — K219 Gastro-esophageal reflux disease without esophagitis: Secondary | ICD-10-CM | POA: Diagnosis not present

## 2014-03-24 DIAGNOSIS — J40 Bronchitis, not specified as acute or chronic: Secondary | ICD-10-CM

## 2014-03-24 DIAGNOSIS — E785 Hyperlipidemia, unspecified: Secondary | ICD-10-CM | POA: Diagnosis not present

## 2014-03-24 DIAGNOSIS — Z7952 Long term (current) use of systemic steroids: Secondary | ICD-10-CM | POA: Insufficient documentation

## 2014-03-24 DIAGNOSIS — I10 Essential (primary) hypertension: Secondary | ICD-10-CM | POA: Insufficient documentation

## 2014-03-24 DIAGNOSIS — Z792 Long term (current) use of antibiotics: Secondary | ICD-10-CM | POA: Insufficient documentation

## 2014-03-24 DIAGNOSIS — Z8776 Personal history of (corrected) congenital malformations of integument, limbs and musculoskeletal system: Secondary | ICD-10-CM | POA: Insufficient documentation

## 2014-03-24 DIAGNOSIS — Z87891 Personal history of nicotine dependence: Secondary | ICD-10-CM | POA: Insufficient documentation

## 2014-03-24 DIAGNOSIS — Z872 Personal history of diseases of the skin and subcutaneous tissue: Secondary | ICD-10-CM | POA: Insufficient documentation

## 2014-03-24 DIAGNOSIS — R0989 Other specified symptoms and signs involving the circulatory and respiratory systems: Secondary | ICD-10-CM | POA: Diagnosis not present

## 2014-03-24 DIAGNOSIS — R05 Cough: Secondary | ICD-10-CM | POA: Diagnosis present

## 2014-03-24 MED ORDER — DOXYCYCLINE HYCLATE 100 MG PO CAPS: 100.0000 mg | ORAL_CAPSULE | Freq: Two times a day (BID) | ORAL | Status: DC

## 2014-03-24 NOTE — Discharge Instructions (Signed)
Upper Respiratory Infection, Adult An upper respiratory infection (URI) is also sometimes known as the common cold. The upper respiratory tract includes the nose, sinuses, throat, trachea, and bronchi. Bronchi are the airways leading to the lungs. Most people improve within 1 week, but symptoms can last up to 2 weeks. A residual cough may last even longer.  CAUSES Many different viruses can infect the tissues lining the upper respiratory tract. The tissues become irritated and inflamed and often become very moist. Mucus production is also common. A cold is contagious. You can easily spread the virus to others by oral contact. This includes kissing, sharing a glass, coughing, or sneezing. Touching your mouth or nose and then touching a surface, which is then touched by another person, can also spread the virus. SYMPTOMS  Symptoms typically develop 1 to 3 days after you come in contact with a cold virus. Symptoms vary from person to person. They may include:  Runny nose.  Sneezing.  Nasal congestion.  Sinus irritation.  Sore throat.  Loss of voice (laryngitis).  Cough.  Fatigue.  Muscle aches.  Loss of appetite.  Headache.  Low-grade fever. DIAGNOSIS  You might diagnose your own cold based on familiar symptoms, since most people get a cold 2 to 3 times a year. Your caregiver can confirm this based on your exam. Most importantly, your caregiver can check that your symptoms are not due to another disease such as strep throat, sinusitis, pneumonia, asthma, or epiglottitis. Blood tests, throat tests, and X-rays are not necessary to diagnose a common cold, but they may sometimes be helpful in excluding other more serious diseases. Your caregiver will decide if any further tests are required. RISKS AND COMPLICATIONS  You may be at risk for a more severe case of the common cold if you smoke cigarettes, have chronic heart disease (such as heart failure) or lung disease (such as asthma), or if  you have a weakened immune system. The very young and very old are also at risk for more serious infections. Bacterial sinusitis, middle ear infections, and bacterial pneumonia can complicate the common cold. The common cold can worsen asthma and chronic obstructive pulmonary disease (COPD). Sometimes, these complications can require emergency medical care and may be life-threatening. PREVENTION  The best way to protect against getting a cold is to practice good hygiene. Avoid oral or hand contact with people with cold symptoms. Wash your hands often if contact occurs. There is no clear evidence that vitamin C, vitamin E, echinacea, or exercise reduces the chance of developing a cold. However, it is always recommended to get plenty of rest and practice good nutrition. TREATMENT  Treatment is directed at relieving symptoms. There is no cure. Antibiotics are not effective, because the infection is caused by a virus, not by bacteria. Treatment may include:  Increased fluid intake. Sports drinks offer valuable electrolytes, sugars, and fluids.  Breathing heated mist or steam (vaporizer or shower).  Eating chicken soup or other clear broths, and maintaining good nutrition.  Getting plenty of rest.  Using gargles or lozenges for comfort.  Controlling fevers with ibuprofen or acetaminophen as directed by your caregiver.  Increasing usage of your inhaler if you have asthma. Zinc gel and zinc lozenges, taken in the first 24 hours of the common cold, can shorten the duration and lessen the severity of symptoms. Pain medicines may help with fever, muscle aches, and throat pain. A variety of non-prescription medicines are available to treat congestion and runny nose. Your caregiver   can make recommendations and may suggest nasal or lung inhalers for other symptoms.  HOME CARE INSTRUCTIONS   Only take over-the-counter or prescription medicines for pain, discomfort, or fever as directed by your  caregiver.  Use a warm mist humidifier or inhale steam from a shower to increase air moisture. This may keep secretions moist and make it easier to breathe.  Drink enough water and fluids to keep your urine clear or pale yellow.  Rest as needed.  Return to work when your temperature has returned to normal or as your caregiver advises. You may need to stay home longer to avoid infecting others. You can also use a face mask and careful hand washing to prevent spread of the virus. SEEK MEDICAL CARE IF:   After the first few days, you feel you are getting worse rather than better.  You need your caregiver's advice about medicines to control symptoms.  You develop chills, worsening shortness of breath, or brown or red sputum. These may be signs of pneumonia.  You develop yellow or brown nasal discharge or pain in the face, especially when you bend forward. These may be signs of sinusitis.  You develop a fever, swollen neck glands, pain with swallowing, or white areas in the back of your throat. These may be signs of strep throat. SEEK IMMEDIATE MEDICAL CARE IF:   You have a fever.  You develop severe or persistent headache, ear pain, sinus pain, or chest pain.  You develop wheezing, a prolonged cough, cough up blood, or have a change in your usual mucus (if you have chronic lung disease).  You develop sore muscles or a stiff neck. Document Released: 07/07/2000 Document Revised: 04/05/2011 Document Reviewed: 04/18/2013 Exit[employer] Patient Information 2015 [employer]. This information is not intended to replace advice given to you by your health care provider. Make sure you discuss any questions you have with your health care provider.  

## 2014-03-24 NOTE — ED Provider Notes (Signed)
CSN: [id number]     Arrival date & time 03/24/14  0935 History   First MD Initiated Contact with Patient 03/24/14 1050     Chief Complaint  Patient presents with  . URI     (Consider location/radiation/quality/duration/timing/severity/associated sxs/prior Treatment) HPI Comments: Patient with a history of pulmonary fibrosis presents with cough and congestion. He is oxygen dependent at 4 L nasal cannula which he uses all the time. He states over the last week he's had nasal congestion and increased cough. His cough is productive of some yellow-green sputum. He denies any chest pain. He states his breathing is pretty much at baseline. He's had a little bit of increased wheezing that time and has been using his inhalers a little bit more than usual. He contacted his pulmonologist, Dr. Delford Field last week who increased his prednisone to 20 mg daily which he just finished up yesterday. He denies any fevers. There is no nausea or vomiting. He denies any increased leg swelling.  Patient is a 80 y.o. male presenting with URI.  URI Presenting symptoms: congestion, cough, fatigue and rhinorrhea   Presenting symptoms: no fever   Associated symptoms: wheezing   Associated symptoms: no arthralgias, no headaches and no sneezing     Past Medical History  Diagnosis Date  . Hypertension   . Hyperlipidemia   . Aortic stenosis     AVR 2011  . Dermatitis     poison ivy  . Cleft palate   . Interstitial lung disease   . Pulmonary fibrosis   . Syncope and collapse 07/18/2014  . GERD (gastroesophageal reflux disease)   . AAA (abdominal aortic aneurysm)    Past Surgical History  Procedure Laterality Date  . Bilat cataract sx  2004  . Cholecystectomy  1999  . 7 surgeries for cleft palate  1938  . Oral surgery with bone graft from hip for cleft palate  1989  . Right groin hernia repair  1950's  . Rotator cuff tear, left shoulder    . Aortic valve replacement w/ pericardial tissue valve  10-15-09    Dr  Barry Dienes   Family History  Problem Relation Age of Onset  . Heart attack Mother   . Cancer Father     lung and stomach  . Heart attack Father   . Cancer Sister     bone  . Diabetes Sister   . Heart murmur Brother   . Hypertension Brother   . Hyperlipidemia Brother   . Heart attack Brother    History  Substance Use Topics  . Smoking status: Former Smoker -- 1.50 packs/day for 5 years    Types: Cigarettes, Cigars    Quit date: 09/08/1977  . Smokeless tobacco: Never Used  . Alcohol Use: No    Review of Systems  Constitutional: Positive for fatigue. Negative for fever, chills and diaphoresis.  HENT: Positive for congestion, postnasal drip and rhinorrhea. Negative for sneezing.   Eyes: Negative.   Respiratory: Positive for cough, shortness of breath and wheezing. Negative for chest tightness.   Cardiovascular: Negative for chest pain and leg swelling.  Gastrointestinal: Negative for nausea, vomiting, abdominal pain, diarrhea and blood in stool.  Genitourinary: Negative for frequency, hematuria, flank pain and difficulty urinating.  Musculoskeletal: Negative for back pain and arthralgias.  Skin: Negative for rash.  Neurological: Negative for dizziness, speech difficulty, weakness, numbness and headaches.      Allergies  Pravastatin sodium and Pirfenidone  Home Medications   Prior to Admission medications  Medication Sig Start Date End Date Taking? Authorizing Provider  AMBULATORY NON FORMULARY MEDICATION Rollator rolling walker with seat.  Diagnosis Physical Deconditioning 799.3 08/14/13   Laren Boom, DO  aspirin 81 MG tablet Take 81 mg by mouth every morning.     Historical Provider, MD  doxycycline (VIBRAMYCIN) 100 MG capsule Take 1 capsule (100 mg total) by mouth 2 (two) times daily. One po bid x 7 days 03/24/14   Rolan Bucco, MD  ferrous sulfate 325 (65 FE) MG EC tablet Take 1 tablet (325 mg total) by mouth daily with breakfast. 01/16/14   Laren Boom, DO  finasteride  (PROSCAR) 5 MG tablet Take 1 tablet (5 mg total) by mouth daily. 01/16/14   Sean Hommel, DO  loratadine (CLARITIN) 10 MG tablet Take 10 mg by mouth every morning.     Historical Provider, MD  midodrine (PROAMATINE) 2.5 MG tablet Take 1 tablet (2.5 mg total) by mouth 2 (two) times daily with a meal. 01/16/14   Laren Boom, DO  Multiple Vitamin (MULTIVITAMIN WITH MINERALS) TABS tablet Take 1 tablet by mouth every morning.     Historical Provider, MD  NON FORMULARY Place 4 L into the nose daily. 3-4 liters and with activity 6 liters    Historical Provider, MD  pantoprazole (PROTONIX) 40 MG tablet TAKE 1 TABLET BY MOUTH TWICE DAILY BEFORE A MEAL 01/16/14   Sean Hommel, DO  pravastatin (PRAVACHOL) 40 MG tablet Take 1 tablet by mouth every night at bedtime 01/16/14   Sean Hommel, DO  predniSONE (DELTASONE) 10 MG tablet Take 1 tablet (10 mg total) by mouth daily with breakfast. 01/16/14   Laren Boom, DO  predniSONE (DELTASONE) 20 MG tablet Take 1 tablet (20 mg total) by mouth daily with breakfast. 03/18/14   Storm Frisk, MD   BP 117/62 mmHg  Pulse 76  Temp(Src) 97.7 F (36.5 C) (Oral)  Resp 18  SpO2 100% Physical Exam  Constitutional: He is oriented to person, place, and time. He appears well-developed and well-nourished.  HENT:  Head: Normocephalic and atraumatic.  Eyes: Pupils are equal, round, and reactive to light.  Neck: Normal range of motion. Neck supple.  Cardiovascular: Normal rate, regular rhythm and normal heart sounds.   Pulmonary/Chest: Effort normal. No respiratory distress. He has wheezes. He has rales. He exhibits no tenderness.  Patient has few expiratory wheezes also crackles throughout the lungs which I suspect is baseline for patient. He has no increased work of breathing. He is talking in full sentences.  Abdominal: Soft. Bowel sounds are normal. There is no tenderness. There is no rebound and no guarding.  Musculoskeletal: Normal range of motion. He exhibits edema.  No  calf tenderness  Lymphadenopathy:    He has no cervical adenopathy.  Neurological: He is alert and oriented to person, place, and time.  Skin: Skin is warm and dry. No rash noted.  Psychiatric: He has a normal mood and affect.    ED Course  Procedures (including critical care time) Labs Review Labs Reviewed - No data to display  Imaging Review Dg Chest 2 View  03/24/2014   CLINICAL DATA:  Cough with congestion for 1 week.  EXAM: CHEST  2 VIEW  COMPARISON:  July 17, 2013.  FINDINGS: Stable cardiomediastinal silhouette. Status post cardiac valve repair. Stable moderate size hiatal hernia is noted. Stable interstitial densities are noted throughout both lungs most consistent with pulmonary fibrosis. No pneumothorax or significant pleural effusion is noted. Stable separation of right acromioclavicular joint  is noted.  IMPRESSION: Stable moderate size hiatal hernia.  Stable interstitial densities are noted throughout both lungs most consistent with pulmonary fibrosis. No significant changes noted compared to prior exam.   Electronically Signed   By: Lupita RaiderJames  Green Jr, M.D.   On: 03/24/2014 10:14     EKG Interpretation None      MDM   Final diagnoses:  Bronchitis    Patient has no evidence of pneumonia. His oxygen saturation is 100% on his baseline 4 L nasal cannula of oxygen. He has no increased work of breathing. Given his increased congestion and coughing I will go ahead and start him on doxycycline. I encouraged him to have close follow-up with his pulmonologist. He is advised to return here if he has any worsening symptoms including fever or increased shortness of breath.    Rolan BuccoMelanie Deasiah Hagberg, MD 03/24/14 [phone]

## 2014-03-24 NOTE — ED Notes (Signed)
Pt placed on 4L [place], this is the rate he uses at all times.

## 2014-03-24 NOTE — ED Notes (Signed)
Error in RT assessment: patient not on RA, currently on 4 LNC

## 2014-03-24 NOTE — ED Notes (Signed)
Congestion, cough for the past week.

## 2014-03-29 ENCOUNTER — Encounter: Payer: Self-pay | Admitting: Adult Health

## 2014-03-29 ENCOUNTER — Ambulatory Visit (INDEPENDENT_AMBULATORY_CARE_PROVIDER_SITE_OTHER): Payer: Medicare Other | Admitting: Adult Health

## 2014-03-29 VITALS — BP 130/78 | HR 77 | Temp 97.7°F | Ht 73.0 in | Wt 216.8 lb

## 2014-03-29 DIAGNOSIS — J8410 Pulmonary fibrosis, unspecified: Secondary | ICD-10-CM | POA: Diagnosis not present

## 2014-03-29 NOTE — Patient Instructions (Signed)
Finish Doxycycline as directed.  Continue on current regimen.  Follow up Dr. Delford FieldWright  As planned and As needed

## 2014-04-04 NOTE — Assessment & Plan Note (Signed)
Recent flare with bronchitis -improving   Plan  Finish Doxycycline as directed.  Continue on current regimen.  Follow up Dr. Delford FieldWright  As planned and As needed

## 2014-04-04 NOTE — Progress Notes (Signed)
   Subjective:    Patient ID: [name], male    DOB: 1934-09-24, 80 y.o.   MRN: [id number]  HPI  HPI 80 y.o.  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 On chronic O2 [facility] 03/2013  Pulmonary rehab completed 10/2012 Hx of severe AS s/p AVR , CAD  Chronic steroids 10 mg   03/29/14 ER follow up  Patient returns for an emergency room follow-up. Patient says he had increased cough and shortness of breath and was seen in emergency room on February 28. Chest x-ray showed no signs of acute process. He was started on doxycycline and prednisone was increased to 20 mg Patient says that he is starting to feel better. He has 1 day left of doxycycline. He is now back to prednisone 10 mg daily. He does still have a productive cough with some yellow mucus, but seems to be clearing. He denies any hemoptysis, chest pain, orthopnea, PND or leg swelling.   Review of Systems Constitutional:   No  weight loss, night sweats,  Fevers, chills, + fatigue, or  lassitude.  HEENT:   No headaches,  Difficulty swallowing,  Tooth/dental problems, or  Sore throat,                No sneezing, itching, ear ache,  +nasal congestion, post nasal drip,   CV:  No chest pain,  Orthopnea, PND, swelling in lower extremities, anasarca, dizziness, palpitations, syncope.   GI  No heartburn, indigestion, abdominal pain, nausea, vomiting, diarrhea, change in bowel habits, loss of appetite, bloody stools.   Resp:  .  No chest wall deformity  Skin: no rash or lesions.  GU: no dysuria, change in color of urine, no urgency or frequency.  No flank pain, no hematuria   MS:  No joint pain or swelling.  No decreased range of motion.  No back pain.  Psych:  No change in mood or affect. No depression or anxiety.  No memory loss.         Objective:   Physical Exam  GEN: A/Ox3; pleasant , NAD, elderly   HEENT:  [place]/AT,  EACs-clear, TMs-wnl,  NOSE-clear, THROAT-clear, no lesions, no postnasal drip or exudate noted.   NECK:  Supple w/ fair ROM; no JVD; normal carotid impulses w/o bruits; no thyromegaly or nodules palpated; no lymphadenopathy.  RESP  BB crackles , no accessory muscle use, no dullness to percussion  CARD:  RRR, no m/r/g  , no peripheral edema, pulses intact, no cyanosis or clubbing.  GI:   Soft & nt; nml bowel sounds; no organomegaly or masses detected.  Musco: Warm bil, no deformities or joint swelling noted.   Neuro: alert, no focal deficits noted.    Skin: Warm, no lesions or rashes        Assessment & Plan:

## 2014-04-05 ENCOUNTER — Encounter: Payer: Self-pay | Admitting: Family Medicine

## 2014-04-05 ENCOUNTER — Ambulatory Visit (INDEPENDENT_AMBULATORY_CARE_PROVIDER_SITE_OTHER): Payer: Medicare Other | Admitting: Family Medicine

## 2014-04-05 VITALS — BP 118/77 | HR 75 | Temp 97.7°F | Wt 202.0 lb

## 2014-04-05 DIAGNOSIS — H6593 Unspecified nonsuppurative otitis media, bilateral: Secondary | ICD-10-CM

## 2014-04-05 MED ORDER — PREDNISONE 20 MG PO TABS: ORAL_TABLET | ORAL | Status: AC

## 2014-04-05 NOTE — Progress Notes (Signed)
CC: [name] is a 80 y.o. male is here for hearing difficulty   Subjective: HPI:  Bilateral hearing loss that has been present for the last 2 or 3 weeks. It happened around the time that he had a upper respiratory illness that has now resolved. He's notice hearing issue present at home and out in the community. Having difficulty with hearing other people speak. Denies any other new motor or sensory disturbances. No foreign objects in either ear. Denies nasal congestion, dizziness, nor ear pain. Denies drainage from either ear. No headaches. No intervention as of yet.   Review Of Systems Outlined In HPI  Past Medical History  Diagnosis Date  . Hypertension   . Hyperlipidemia   . Aortic stenosis     AVR 2011  . Dermatitis     poison ivy  . Cleft palate   . Interstitial lung disease   . Pulmonary fibrosis   . Syncope and collapse 07/18/2014  . GERD (gastroesophageal reflux disease)   . AAA (abdominal aortic aneurysm)     Past Surgical History  Procedure Laterality Date  . Bilat cataract sx  2004  . Cholecystectomy  1999  . 7 surgeries for cleft palate  1938  . Oral surgery with bone graft from hip for cleft palate  1989  . Right groin hernia repair  1950's  . Rotator cuff tear, left shoulder    . Aortic valve replacement w/ pericardial tissue valve  10-15-09    Dr Barry Dieneswens   Family History  Problem Relation Age of Onset  . Heart attack Mother   . Cancer Father     lung and stomach  . Heart attack Father   . Cancer Sister     bone  . Diabetes Sister   . Heart murmur Brother   . Hypertension Brother   . Hyperlipidemia Brother   . Heart attack Brother     History   Social History  . Marital Status: Married    Spouse Name: N/A  . Number of Children: N/A  . Years of Education: N/A   Occupational History  . Retired    Social History Main Topics  . Smoking status: Former Smoker -- 1.50 packs/day for 5 years    Types: Cigarettes, Cigars    Quit date:  09/08/1977  . Smokeless tobacco: Never Used  . Alcohol Use: No  . Drug Use: No  . Sexual Activity: Not on file   Other Topics Concern  . Not on file   Social History Narrative     Objective: BP 118/77 mmHg  Pulse 75  Temp(Src) 97.7 F (36.5 C) (Oral)  Wt 202 lb (91.627 kg)  General: Alert and Oriented, No Acute Distress HEENT: Pupils equal, round, reactive to light. Conjunctivae clear.  External ears unremarkable, canals clear with intact TMs with appropriate landmarks.  Middle ear appears to have a serous effusion bilaterally. Pink inferior turbinates.  Moist mucous membranes, pharynx without inflammation nor lesions.  Neck supple without palpable lymphadenopathy nor abnormal masses. Extremities: No peripheral edema.  Strong peripheral pulses.  Mental Status: No depression, anxiety, nor agitation. Skin: Warm and dry. Neuro: Cranial nerves II through XII grossly intact. Sensorineural hearing is preserved however conductive hearing is significantly diminished with Rinne testing.  Assessment & Plan: [name] was seen today for hearing difficulty.  Diagnoses and all orders for this visit:  Bilateral serous otitis media, recurrence not specified, unspecified chronicity Orders: -     predniSONE (DELTASONE) 20 MG tablet; Three  tabs daily days 1-3, two tabs daily days 4-6, one tab daily days 7-9, half tab daily days 10-13.   Suspect that serous effusion is causing his conductive hearing issues. Start prednisone taper, hold 10 mg dose daily until the taper gets down to 10 mg daily and resume prior dose of 10 mg daily. If no better in 1 week I've asked him to call me and I'll refer to audiology and ear nose and throat.  25 minutes spent face-to-face during visit today of which at least 50% was counseling or coordinating care regarding: 1. Bilateral serous otitis media, recurrence not specified, unspecified chronicity       Return if symptoms worsen or fail to improve.

## 2014-04-12 ENCOUNTER — Telehealth: Payer: Self-pay | Admitting: Critical Care Medicine

## 2014-04-12 NOTE — Telephone Encounter (Signed)
Spoke with pt. Pt is needing airline form completed and signed by PW. Form given to Crystal. Pt will need to come by and pick form up as it requires a signature and date of travel. While speaking with pt he was unsure of when his flight was.  Will forward to Crystal.

## 2014-04-16 ENCOUNTER — Telehealth: Payer: Self-pay | Admitting: Critical Care Medicine

## 2014-04-16 NOTE — Telephone Encounter (Signed)
Called and spoke to pt. Pt stated the airline form state he uses 4lpm. Pt stated he only uses 3lpm pulsed and is needing the form to reflect that. Informed pt we can print out a new form and once it is ready to be picked up we will call him. Pt verbalized understanding and denied any further questions or concerns at this time.    Will forward to Crystal to follow.

## 2014-04-16 NOTE — Telephone Encounter (Signed)
Form signed by Dr. Delford FieldWright and placed at front for pick up.  Copy placed in scan folder. Pt aware original at front for pick up and is aware he will need to sign/date form and will need date of travel and flight # for form to be completed.   He verbalized understanding and voiced no further questions or concerns at this time.

## 2014-04-17 ENCOUNTER — Ambulatory Visit: Payer: Medicare Other | Admitting: Family Medicine

## 2014-04-17 NOTE — Telephone Encounter (Signed)
While in plane, pt will be at high altitude and will need to use 4 lpm during that time.  Dr. Delford FieldWright would like to leave form as is because of this.  Will call pt.

## 2014-04-18 ENCOUNTER — Telehealth: Payer: Self-pay | Admitting: Critical Care Medicine

## 2014-04-18 NOTE — Telephone Encounter (Signed)
Pt needs to know if he will need to use pulsed or continuous o2 during flight. Concentrator can go up to 5lpm pulsed but only 3lpm continuous. Per Dr. Delford FieldWright, pt can use 4lpm pulsed.  Form updated.  Copy placed in scan folder and [facility]. Pt aware and voiced no further questions or concerns at this time.

## 2014-04-18 NOTE — Telephone Encounter (Signed)
Pt returned call - [phone]

## 2014-04-18 NOTE — Telephone Encounter (Signed)
Pt is aware that PW does not want to change the form. He verbalized understanding. Nothing further was needed.

## 2014-04-18 NOTE — Telephone Encounter (Signed)
lmtcb for pt.  

## 2014-04-18 NOTE — Telephone Encounter (Signed)
[name] was checking on the form he faxed over for the pt. Roy Bayleydvised [name] that we if do have the form, PW would not be here to sign this until Monday.  I looked in PW look at. The form was not in there. CJ - have you seen this form?  Thanks.

## 2014-04-18 NOTE — Telephone Encounter (Signed)
Called byron and LMTCB x1

## 2014-04-22 NOTE — Telephone Encounter (Signed)
Will sign.

## 2014-04-22 NOTE — Telephone Encounter (Signed)
Form signed by Dr. Delford FieldWright and faxed to [employer] at [phone]. [email or web address] for [place] with [employer] to inform of above.  Note: form placed in scan folder.

## 2014-04-22 NOTE — Telephone Encounter (Signed)
Received form from [employer] for POC.   Form completed and placed in Dr. Lynelle DoctorWright's folder for signature.

## 2014-04-23 NOTE — Telephone Encounter (Signed)
Spoke with Ortencia KickByron and verified that he received form.  Form placed back in scan folder.

## 2014-04-23 NOTE — Telephone Encounter (Signed)
Spoke with Ortencia KickByron at [facility] and he states he did not receive a fax yesterday.  Medical records sent form back up.  Re faxed form to [employer].   LMOMTCB for Ortencia KickByron to verify that he received the form.

## 2014-05-01 DIAGNOSIS — H43819 Vitreous degeneration, unspecified eye: Secondary | ICD-10-CM | POA: Diagnosis not present

## 2014-05-01 DIAGNOSIS — Z961 Presence of intraocular lens: Secondary | ICD-10-CM | POA: Diagnosis not present

## 2014-05-01 DIAGNOSIS — H01009 Unspecified blepharitis unspecified eye, unspecified eyelid: Secondary | ICD-10-CM | POA: Diagnosis not present

## 2014-05-15 ENCOUNTER — Ambulatory Visit (INDEPENDENT_AMBULATORY_CARE_PROVIDER_SITE_OTHER): Payer: Medicare Other | Admitting: Family Medicine

## 2014-05-15 ENCOUNTER — Encounter: Payer: Self-pay | Admitting: Family Medicine

## 2014-05-15 VITALS — BP 108/60 | HR 79 | Wt 214.0 lb

## 2014-05-15 DIAGNOSIS — I951 Orthostatic hypotension: Secondary | ICD-10-CM | POA: Diagnosis not present

## 2014-05-15 DIAGNOSIS — D508 Other iron deficiency anemias: Secondary | ICD-10-CM

## 2014-05-15 DIAGNOSIS — R05 Cough: Secondary | ICD-10-CM | POA: Diagnosis not present

## 2014-05-15 DIAGNOSIS — R059 Cough, unspecified: Secondary | ICD-10-CM

## 2014-05-15 MED ORDER — BENZONATATE 100 MG PO CAPS: 100.0000 mg | ORAL_CAPSULE | Freq: Two times a day (BID) | ORAL | Status: DC | PRN

## 2014-05-15 NOTE — Patient Instructions (Signed)
To help fluid in ears start a daily nasal spray, either Flonase or Nasacort (both are over the counter) for at least two weeks.

## 2014-05-15 NOTE — Progress Notes (Signed)
CC: Guss BundeRobert G Manna is a 80 y.o. male is here for Hypertension   Subjective: HPI:  Follow-up hypotension: He continues to take midodrine twice a day with no known side effects. He tells that he goes from a seated to a standing position quickly he'll feel somewhat lightheaded however this rarely happens since he's overall thought himself to slowly change vertical positions. He denies any dizziness other than in the situation. Denies any chest pain nor limb claudication.  Follow-up iron deficiency anemia: He continues to take iron supplement daily with breakfast without constipation. Hemoglobin has not been checked for over a few months. He denies any new shortness of breath or weakness.  Complains of a cough that is only bothering him to a mild degree and bothering his wife more than him. It's been present for a few days now. He describes a tickle in the back of the throat but no chest pain or new pulmonary complaints other than the cough. No wheezing or chest discomfort. Review of systems positive for nasal congestion worse when exposed to pollen.denies fevers, chills, night sweats or unintentional weight loss   Review Of Systems Outlined In HPI  Past Medical History  Diagnosis Date  . Hypertension   . Hyperlipidemia   . Aortic stenosis     AVR 2011  . Dermatitis     poison ivy  . Cleft palate   . Interstitial lung disease   . Pulmonary fibrosis   . Syncope and collapse 07/18/2014  . GERD (gastroesophageal reflux disease)   . AAA (abdominal aortic aneurysm)     Past Surgical History  Procedure Laterality Date  . Bilat cataract sx  2004  . Cholecystectomy  1999  . 7 surgeries for cleft palate  1938  . Oral surgery with bone graft from hip for cleft palate  1989  . Right groin hernia repair  1950's  . Rotator cuff tear, left shoulder    . Aortic valve replacement w/ pericardial tissue valve  10-15-09    Dr Barry Dieneswens   Family History  Problem Relation Age of Onset  . Heart attack  Mother   . Cancer Father     lung and stomach  . Heart attack Father   . Cancer Sister     bone  . Diabetes Sister   . Heart murmur Brother   . Hypertension Brother   . Hyperlipidemia Brother   . Heart attack Brother     History   Social History  . Marital Status: Married    Spouse Name: N/A  . Number of Children: N/A  . Years of Education: N/A   Occupational History  . Retired    Social History Main Topics  . Smoking status: Former Smoker -- 1.50 packs/day for 5 years    Types: Cigarettes, Cigars    Quit date: 09/08/1977  . Smokeless tobacco: Never Used  . Alcohol Use: No  . Drug Use: No  . Sexual Activity: Not on file   Other Topics Concern  . Not on file   Social History Narrative     Objective: BP 108/60 mmHg  Pulse 79  Wt 214 lb (97.07 kg)  General: Alert and Oriented, No Acute Distress HEENT: Pupils equal, round, reactive to light. Conjunctivae clear.  External ears unremarkable, canals clear with intact TMs with appropriate landmarks.  Middle ear appears open without effusion. Pink inferior turbinates.  Moist mucous membranes, pharynx without inflammation nor lesions.  Neck supple without palpable lymphadenopathy nor abnormal masses. Lungs: comfortable  work of breathing with trace crackles in the lung bases without wheezing or rhonchi Cardiac: Regular rate and rhythm. Normal S1/S2.  No murmurs, rubs, nor gallops.   Extremities: No peripheral edema.  Strong peripheral pulses.  Mental Status: No depression, anxiety, nor agitation. Skin: Warm and dry.  Assessment & Plan: Luvern was seen today for hypertension.  Diagnoses and all orders for this visit:  Orthostatic hypotension  Other iron deficiency anemias Orders: -     CBC  Cough Orders: -     benzonatate (TESSALON) 100 MG capsule; Take 1 capsule (100 mg total) by mouth 2 (two) times daily as needed for cough.   Orthostatic hypertension: Controlled continue bid midodrine Cough: Suspect  postnasal drip begin Tessalon Perles.Signs and symptoms requring emergent/urgent reevaluation were discussed with the patient. Due for repeat CBC continue iron supplementation pending results  Return in about 3 months (around 08/14/2014) for blood pressure and ears.

## 2014-05-16 LAB — CBC
HEMATOCRIT: 29.5 % — AB (ref 39.0–52.0)
Hemoglobin: 9.9 g/dL — ABNORMAL LOW (ref 13.0–17.0)
MCH: 26.3 pg (ref 26.0–34.0)
MCHC: 33.6 g/dL (ref 30.0–36.0)
MCV: 78.5 fL (ref 78.0–100.0)
Platelets: 185 10*3/uL (ref 150–400)
RBC: 3.76 MIL/uL — ABNORMAL LOW (ref 4.22–5.81)
RDW: 17.7 % — ABNORMAL HIGH (ref 11.5–15.5)
WBC: 15.9 10*3/uL — ABNORMAL HIGH (ref 4.0–10.5)

## 2014-05-22 ENCOUNTER — Encounter: Payer: Self-pay | Admitting: Family Medicine

## 2014-05-22 ENCOUNTER — Ambulatory Visit (INDEPENDENT_AMBULATORY_CARE_PROVIDER_SITE_OTHER): Payer: Medicare Other | Admitting: Family Medicine

## 2014-05-22 ENCOUNTER — Ambulatory Visit (INDEPENDENT_AMBULATORY_CARE_PROVIDER_SITE_OTHER): Payer: Medicare Other

## 2014-05-22 VITALS — BP 95/56 | HR 83 | Temp 98.1°F | Wt 214.0 lb

## 2014-05-22 DIAGNOSIS — R0602 Shortness of breath: Secondary | ICD-10-CM | POA: Diagnosis not present

## 2014-05-22 DIAGNOSIS — R059 Cough, unspecified: Secondary | ICD-10-CM

## 2014-05-22 DIAGNOSIS — R05 Cough: Secondary | ICD-10-CM

## 2014-05-22 DIAGNOSIS — J8489 Other specified interstitial pulmonary diseases: Secondary | ICD-10-CM | POA: Diagnosis not present

## 2014-05-22 MED ORDER — PREDNISONE 20 MG PO TABS: ORAL_TABLET | ORAL | Status: AC

## 2014-05-22 MED ORDER — DOXYCYCLINE HYCLATE 100 MG PO TABS: ORAL_TABLET | ORAL | Status: AC

## 2014-05-22 NOTE — Progress Notes (Signed)
CC: [name] is a 80 y.o. male is here for Cough   Subjective: HPI:  Productive cough that has been present for 2 weeks now. It was slowly getting better over the last week with Jerilynn Som as the only intervention. On Sunday began to become more productive and he began to have shortness of breath beyond his baseline. Shortness of breath was present only with exertion. He's been coughing up green mucus without any blood in it. He denies any chest pain or confusion. He had some nasal congestion last time I saw him but this is now improved but he does feel like his left ear has a sensation of fullness to it. He's been using Flonase for couple weeks but it doesn't seem to help with the ear. He denies fevers, chills, confusion, facial pressure, rash or headache. Symptoms overall are moderate in severity, worse at nighttime.     Review Of Systems Outlined In HPI  Past Medical History  Diagnosis Date  . Hypertension   . Hyperlipidemia   . Aortic stenosis     AVR 2011  . Dermatitis     poison ivy  . Cleft palate   . Interstitial lung disease   . Pulmonary fibrosis   . Syncope and collapse 07/18/2014  . GERD (gastroesophageal reflux disease)   . AAA (abdominal aortic aneurysm)     Past Surgical History  Procedure Laterality Date  . Bilat cataract sx  2004  . Cholecystectomy  1999  . 7 surgeries for cleft palate  1938  . Oral surgery with bone graft from hip for cleft palate  1989  . Right groin hernia repair  1950's  . Rotator cuff tear, left shoulder    . Aortic valve replacement w/ pericardial tissue valve  10-15-09    Dr Barry Dienes   Family History  Problem Relation Age of Onset  . Heart attack Mother   . Cancer Father     lung and stomach  . Heart attack Father   . Cancer Sister     bone  . Diabetes Sister   . Heart murmur Brother   . Hypertension Brother   . Hyperlipidemia Brother   . Heart attack Brother     History   Social History  . Marital Status:  Married    Spouse Name: N/A  . Number of Children: N/A  . Years of Education: N/A   Occupational History  . Retired    Social History Main Topics  . Smoking status: Former Smoker -- 1.50 packs/day for 5 years    Types: Cigarettes, Cigars    Quit date: 09/08/1977  . Smokeless tobacco: Never Used  . Alcohol Use: No  . Drug Use: No  . Sexual Activity: Not on file   Other Topics Concern  . Not on file   Social History Narrative     Objective: BP 95/56 mmHg  Pulse 83  Temp(Src) 98.1 F (36.7 C) (Oral)  Wt 214 lb (97.07 kg)  SpO2 98%  General: Alert and Oriented, No Acute Distress HEENT: Pupils equal, round, reactive to light. Conjunctivae clear.  External ears unremarkable, canals clear with intact TMs with appropriate landmarks. Right Middle ear appears open without effusion, left middle ear with a mild serous effusion. Pink inferior turbinates.  Moist mucous membranes, pharynx without inflammation nor lesions.  Neck supple without palpable lymphadenopathy nor abnormal masses. Lungs: Bibasilar rales with mild wheezing in the left lung field and diffuse mild rhonchi. Cardiac: Regular rate and rhythm.  Normal S1/S2.  No murmurs, rubs, nor gallops.   Extremities: No peripheral edema.  Strong peripheral pulses.  Mental Status: No depression, anxiety, nor agitation. Skin: Warm and dry.  Assessment & Plan: [name] was seen today for cough.  Diagnoses and all orders for this visit:  Cough Orders: -     DG Chest 2 View; Future -     doxycycline (VIBRA-TABS) 100 MG tablet; One by mouth twice a day for ten days. -     predniSONE (DELTASONE) 20 MG tablet; Three tabs daily days 1-3, two tabs daily days 4-6, one tab daily days 7-9, half tab daily days 10-13.  SOB (shortness of breath) Orders: -     doxycycline (VIBRA-TABS) 100 MG tablet; One by mouth twice a day for ten days. -     predniSONE (DELTASONE) 20 MG tablet; Three tabs daily days 1-3, two tabs daily days 4-6, one tab daily  days 7-9, half tab daily days 10-13.   Chest x-ray was obtained to rule out pneumonia, fortunately no signs of consolidation. Begin doxycycline and prednisone was added as well due to wheezing. Call if no better by Friday.  Return if symptoms worsen or fail to improve.

## 2014-05-27 ENCOUNTER — Encounter: Payer: Self-pay | Admitting: Family Medicine

## 2014-05-27 MED ORDER — AMOXICILLIN-POT CLAVULANATE 500-125 MG PO TABS: ORAL_TABLET | ORAL | Status: AC

## 2014-05-27 NOTE — Telephone Encounter (Signed)
New rx

## 2014-06-11 ENCOUNTER — Encounter: Payer: Self-pay | Admitting: Family Medicine

## 2014-06-11 ENCOUNTER — Ambulatory Visit (INDEPENDENT_AMBULATORY_CARE_PROVIDER_SITE_OTHER): Payer: Medicare Other | Admitting: Family Medicine

## 2014-06-11 VITALS — BP 92/59 | HR 80 | Wt 203.0 lb

## 2014-06-11 DIAGNOSIS — D509 Iron deficiency anemia, unspecified: Secondary | ICD-10-CM

## 2014-06-11 DIAGNOSIS — R350 Frequency of micturition: Secondary | ICD-10-CM | POA: Diagnosis not present

## 2014-06-11 DIAGNOSIS — R630 Anorexia: Secondary | ICD-10-CM

## 2014-06-11 NOTE — Progress Notes (Signed)
CC: [name] is a 80 y.o. male is here for Fatigue   Subjective: HPI:   complains of lack of appetite that has been present for the last 2 half weeks that occurred sometime around when he began doxycycline and a prednisone taper  For upper respiratory illness. Symptoms have been mild in severity and do not seem to be getting better or worse since onset. He still eats dinner every night but has no appetite for breakfast or lunch or any snacks. He also tells me it is not that thirsty either. He denies any pain or discomfort with eating or drinking he's just not that interested in it. He's also noticed that he has had some decreased taste  But is not certain about change in smell.   He's been checking his blood pressures at home  All there are within the normotensive range with systolics typically in triple digits low 120.  He's noticed that lightheadedness upon standing is worse since he's decreased his oral and solid intake. He denies constipation diarrhea abdominal pain reflux dysphagia, nasal congestion, hearing loss, shortness of breath, chest pain, fevers nor chills. Review of systems positive for urinating at night more often than he is used to, unable to quantify how often at night. No dysuria or other urine complaints.   Review Of Systems Outlined In HPI  Past Medical History  Diagnosis Date  . Hypertension   . Hyperlipidemia   . Aortic stenosis     AVR 2011  . Dermatitis     poison ivy  . Cleft palate   . Interstitial lung disease   . Pulmonary fibrosis   . Syncope and collapse 07/18/2014  . GERD (gastroesophageal reflux disease)   . AAA (abdominal aortic aneurysm)     Past Surgical History  Procedure Laterality Date  . Bilat cataract sx  2004  . Cholecystectomy  1999  . 7 surgeries for cleft palate  1938  . Oral surgery with bone graft from hip for cleft palate  1989  . Right groin hernia repair  1950's  . Rotator cuff tear, left shoulder    . Aortic valve  replacement w/ pericardial tissue valve  10-15-09    Dr Barry Dieneswens   Family History  Problem Relation Age of Onset  . Heart attack Mother   . Cancer Father     lung and stomach  . Heart attack Father   . Cancer Sister     bone  . Diabetes Sister   . Heart murmur Brother   . Hypertension Brother   . Hyperlipidemia Brother   . Heart attack Brother     History   Social History  . Marital Status: Married    Spouse Name: N/A  . Number of Children: N/A  . Years of Education: N/A   Occupational History  . Retired    Social History Main Topics  . Smoking status: Former Smoker -- 1.50 packs/day for 5 years    Types: Cigarettes, Cigars    Quit date: 09/08/1977  . Smokeless tobacco: Never Used  . Alcohol Use: No  . Drug Use: No  . Sexual Activity: Not on file   Other Topics Concern  . Not on file   Social History Narrative     Objective: BP 92/59 mmHg  Pulse 80  Wt 203 lb (92.08 kg)  SpO2 95%  General: Alert and Oriented, No Acute Distress HEENT: Pupils equal, round, reactive to light. Conjunctivae clear.  External ears unremarkable, canals clear with  intact TMs with appropriate landmarks.  Middle ear appears open without effusion. Pink inferior turbinates.  Moist mucous membranes, pharynx without inflammation nor lesions.  Neck supple without palpable lymphadenopathy nor abnormal masses. Lungs: Clear to auscultation bilaterally, no wheezing/ronchi/rales.  Comfortable work of breathing. Good air movement. Cardiac: Regular rate and rhythm. Normal S1/S2.  No murmurs, rubs, nor gallops.   Abdomen: Normal bowel sounds, soft and non tender without palpable masses. Extremities: No peripheral edema.  Strong peripheral pulses.  Mental Status: No depression, anxiety, nor agitation. Skin: Warm and dry.  Assessment & Plan: [name] was seen today for fatigue.  Diagnoses and all orders for this visit:  Anemia, iron deficiency Orders: -     COMPLETE METABOLIC PANEL WITH GFR -      CBC -     Urinalysis, Routine w reflex microscopic  Appetite loss Orders: -     COMPLETE METABOLIC PANEL WITH GFR -     CBC -     Urinalysis, Routine w reflex microscopic  Urinary frequency Orders: -     Urinalysis, Routine w reflex microscopic    appetite loss: Check a metabolic panel  For hepatic or liver  Abnormalities  Anemia: repeat hemoglobin given that it was decreasing at his last check last month  Rule out UTI with urinalysis  Return if symptoms worsen or fail to improve.

## 2014-06-12 ENCOUNTER — Ambulatory Visit: Payer: PRIVATE HEALTH INSURANCE | Admitting: Family Medicine

## 2014-06-12 LAB — COMPLETE METABOLIC PANEL WITH GFR
ALK PHOS: 46 U/L (ref 39–117)
ALT: 13 U/L (ref 0–53)
AST: 15 U/L (ref 0–37)
Albumin: 4 g/dL (ref 3.5–5.2)
BUN: 18 mg/dL (ref 6–23)
CO2: 31 mEq/L (ref 19–32)
Calcium: 9.3 mg/dL (ref 8.4–10.5)
Chloride: 98 mEq/L (ref 96–112)
Creat: 0.88 mg/dL (ref 0.50–1.35)
GFR, EST NON AFRICAN AMERICAN: 83 mL/min
GFR, Est African American: >89 mL/min
Glucose, Bld: 108 mg/dL — ABNORMAL HIGH (ref 70–99)
POTASSIUM: 5 meq/L (ref 3.5–5.3)
SODIUM: 138 meq/L (ref 135–145)
Total Bilirubin: 0.9 mg/dL (ref 0.2–1.2)
Total Protein: 6.9 g/dL (ref 6.0–8.3)

## 2014-06-12 LAB — CBC
HEMATOCRIT: 30.6 % — AB (ref 39.0–52.0)
Hemoglobin: 10.4 g/dL — ABNORMAL LOW (ref 13.0–17.0)
MCH: 26.7 pg (ref 26.0–34.0)
MCHC: 34 g/dL (ref 30.0–36.0)
MCV: 78.5 fL (ref 78.0–100.0)
PLATELETS: 184 10*3/uL (ref 150–400)
RBC: 3.9 MIL/uL — AB (ref 4.22–5.81)
RDW: 17.7 % — ABNORMAL HIGH (ref 11.5–15.5)
WBC: 12.6 10*3/uL — AB (ref 4.0–10.5)

## 2014-06-12 LAB — URINALYSIS, ROUTINE W REFLEX MICROSCOPIC
Glucose, UA: NEGATIVE mg/dL
Hgb urine dipstick: NEGATIVE
Ketones, ur: NEGATIVE mg/dL
Leukocytes, UA: NEGATIVE
NITRITE: NEGATIVE
Protein, ur: 30 mg/dL — AB
SPECIFIC GRAVITY, URINE: 1.012 (ref 1.005–1.030)
UROBILINOGEN UA: 4 mg/dL — AB (ref 0.0–1.0)
pH: 6 (ref 5.0–8.0)

## 2014-06-12 LAB — URINALYSIS, MICROSCOPIC ONLY
BACTERIA UA: NONE SEEN
Casts: NONE SEEN
Crystals: NONE SEEN
Squamous Epithelial / LPF: NONE SEEN

## 2014-07-03 ENCOUNTER — Ambulatory Visit (INDEPENDENT_AMBULATORY_CARE_PROVIDER_SITE_OTHER): Payer: Medicare Other | Admitting: Critical Care Medicine

## 2014-07-03 ENCOUNTER — Encounter: Payer: Self-pay | Admitting: Critical Care Medicine

## 2014-07-03 VITALS — BP 120/62 | HR 78 | Temp 98.2°F | Ht 73.0 in | Wt 208.4 lb

## 2014-07-03 DIAGNOSIS — J8489 Other specified interstitial pulmonary diseases: Secondary | ICD-10-CM | POA: Diagnosis not present

## 2014-07-03 NOTE — Progress Notes (Signed)
   Subjective:    Patient ID: [name], male    DOB: 07-10-1934, 80 y.o.   MRN: [id number]  HPI 07/03/2014 Chief Complaint  Patient presents with  . Follow-up    Feels breathing is doing much better.  DOE at baseline.  No cough, chest tightness, CP, or wheezing at this time.   Doing well on pred 10mg  /d.  Min cough and mucus.  No real chest pain.  No real wheeze.  No edema in feet .  DOE is stable.  On 3L rest 4L exert. sats 98% This patient continues to use and benefit from her oxygen therapy and has re qualified at this visit for continued oxygen therapy   Pt denies any significant sore throat, nasal congestion or excess secretions, fever, chills, sweats, unintended weight loss, pleurtic or exertional chest pain, orthopnea PND, or leg swelling Pt denies any increase in rescue therapy over baseline, denies waking up needing it or having any early am or nocturnal exacerbations of coughing/wheezing/or dyspnea. Pt also denies any obvious fluctuation in symptoms with  weather or environmental change or other alleviating or aggravating factors   Current Medications, Allergies, Complete Past Medical History, Past Surgical History, Family History, and Social History were reviewed in [facility] electronic medical record per todays encounter:  07/03/2014  Review of Systems  Constitutional: Positive for fatigue.  HENT: Negative.  Negative for ear pain, postnasal drip, rhinorrhea, sinus pressure, sore throat, trouble swallowing and voice change.   Eyes: Negative.   Respiratory: Positive for cough and shortness of breath. Negative for apnea, choking, chest tightness, wheezing and stridor.   Cardiovascular: Negative.  Negative for chest pain, palpitations and leg swelling.  Gastrointestinal: Negative.  Negative for nausea, vomiting, abdominal pain and abdominal distention.  Genitourinary: Negative.   Musculoskeletal: Negative.  Negative for myalgias and arthralgias.  Skin: Negative.   Negative for rash.  Allergic/Immunologic: Negative.  Negative for environmental allergies and food allergies.  Neurological: Negative.  Negative for dizziness, syncope, weakness and headaches.  Hematological: Negative.  Negative for adenopathy. Does not bruise/bleed easily.  Psychiatric/Behavioral: Negative.  Negative for sleep disturbance and agitation. The patient is not nervous/anxious.        Objective:   Physical Exam  Filed Vitals:   07/03/14 0912  BP: 120/62  Pulse: 78  Temp: 98.2 F (36.8 C)  TempSrc: Oral  Height: 6\' 1"  (1.854 m)  Weight: 208 lb 6.4 oz (94.53 kg)  SpO2: 98%    Gen: Pleasant, well-nourished, in no distress,  normal affect  ENT: No lesions,  mouth clear,  oropharynx clear, no postnasal drip  Neck: No JVD, no TMG, no carotid bruits  Lungs: No use of accessory muscles, no dullness to percussion, dry rales at bases  Cardiovascular: RRR, heart sounds normal, no murmur or gallops, no peripheral edema  Abdomen: soft and NT, no HSM,  BS normal  Musculoskeletal: No deformities, no cyanosis or clubbing  Neuro: alert, non focal  Skin: Warm, no lesions or rashes  No results found.       Assessment & Plan:  I personally reviewed all images and lab [facility] as well as any outside material available during this office visit and agree with the  radiology impressions.   BOOP (bronchiolitis obliterans with organizing pneumonia) Stable boop/cop .  Chronic hypoxemic resp failure, stable at this time. Plan Cont prednisone 10mg  daily F/u with PFTs     PFTs ordered this visit

## 2014-07-03 NOTE — Patient Instructions (Signed)
Stay on prednisone 10mg  daily A lung function test will be obtained Return 4 months , see Dr Marchelle Gearingamaswamy

## 2014-07-03 NOTE — Assessment & Plan Note (Signed)
Stable boop/cop .  Chronic hypoxemic resp failure, stable at this time. Plan Cont prednisone 10mg  daily F/u with PFTs

## 2014-07-18 DIAGNOSIS — R55 Syncope and collapse: Secondary | ICD-10-CM

## 2014-07-18 HISTORY — DX: Syncope and collapse: R55

## 2014-07-21 ENCOUNTER — Other Ambulatory Visit: Payer: Self-pay | Admitting: Family Medicine

## 2014-07-24 ENCOUNTER — Encounter: Payer: Self-pay | Admitting: Cardiology

## 2014-07-24 ENCOUNTER — Ambulatory Visit (INDEPENDENT_AMBULATORY_CARE_PROVIDER_SITE_OTHER): Payer: Medicare Other | Admitting: Cardiology

## 2014-07-24 VITALS — BP 142/94 | HR 80 | Ht 73.0 in | Wt 207.0 lb

## 2014-07-24 DIAGNOSIS — I714 Abdominal aortic aneurysm, without rupture, unspecified: Secondary | ICD-10-CM

## 2014-07-24 DIAGNOSIS — I359 Nonrheumatic aortic valve disorder, unspecified: Secondary | ICD-10-CM | POA: Diagnosis not present

## 2014-07-24 NOTE — Progress Notes (Signed)
HPI: FU AVR. Previously diagnosed with severe AS by echo. Cardiac catheterization in Sept 2011 revealed nonobstructive coronary disease, normal LV function and moderately severe aortic stenosis with a mean gradient of 34 mm of mercury. The patient then underwent aortic valve replacement on September 21 of 2011 with a pericardial tissue valve. A chest CT was performed in May of 2014 and suggested interstitial lung disease. The patient seen by Dr. Delford Field and diagnosed with BOOP. Last echocardiogram in May 2015 showed normal LV function, mild left atrial, right atrial and right ventricular enlargement and a bioprosthetic aortic valve with mean gradient 15 mmHg. Abdominal CT July 2015 showed abdominal aortic aneurysm of 3 cm.Since I last saw him, he has some dyspnea on exertion which is unchanged. He is on home oxygen for his pulmonary fibrosis. He denies chest pain, orthopnea, PND or pedal edema.  Current Outpatient Prescriptions  Medication Sig Dispense Refill  . AMBULATORY NON FORMULARY MEDICATION Rollator rolling walker with seat.  Diagnosis Physical Deconditioning 799.3 1 Units 0  . aspirin 81 MG tablet Take 81 mg by mouth every morning.     . ferrous sulfate 325 (65 FE) MG EC tablet Take 1 tablet (325 mg total) by mouth daily with breakfast. 90 tablet 3  . finasteride (PROSCAR) 5 MG tablet Take 1 tablet (5 mg total) by mouth daily. 90 tablet 1  . loratadine (CLARITIN) 10 MG tablet Take 10 mg by mouth every morning.     . midodrine (PROAMATINE) 2.5 MG tablet TAKE 1 TABLET BY MOUTH TWICE DAILY WITH A MEAL 180 tablet 0  . Multiple Vitamin (MULTIVITAMIN WITH MINERALS) TABS tablet Take 1 tablet by mouth every morning.     . Multiple Vitamin (MULTIVITAMIN) tablet Take 1 tablet by mouth daily.    . NON FORMULARY Place 4 L into the nose daily. 3-4 liters and with activity 6 liters    . pantoprazole (PROTONIX) 40 MG tablet TAKE 1 TABLET BY MOUTH TWICE DAILY BEFORE A MEAL 120 tablet 1  . pravastatin  (PRAVACHOL) 40 MG tablet Take 1 tablet by mouth every night at bedtime 90 tablet 1  . predniSONE (DELTASONE) 10 MG tablet Take 1 tablet (10 mg total) by mouth daily with breakfast. 90 tablet 1  . [DISCONTINUED] tadalafil (CIALIS) 20 MG tablet Take 20 mg by mouth daily as needed. Take one about 1-2 hours before sexual intercourse      No current facility-administered medications for this visit.     Past Medical History  Diagnosis Date  . Hypertension   . Hyperlipidemia   . Aortic stenosis     AVR 2011  . Dermatitis     poison ivy  . Cleft palate   . Interstitial lung disease   . Pulmonary fibrosis   . Syncope and collapse 07/18/2014  . GERD (gastroesophageal reflux disease)   . AAA (abdominal aortic aneurysm)     Past Surgical History  Procedure Laterality Date  . Bilat cataract sx  2004  . Cholecystectomy  1999  . 7 surgeries for cleft palate  1938  . Oral surgery with bone graft from hip for cleft palate  1989  . Right groin hernia repair  1950's  . Rotator cuff tear, left shoulder    . Aortic valve replacement w/ pericardial tissue valve  10-15-09    Dr Barry Dienes    History   Social History  . Marital Status: Married    Spouse Name: N/A  . Number of Children:  N/A  . Years of Education: N/A   Occupational History  . Retired    Social History Main Topics  . Smoking status: Former Smoker -- 1.50 packs/day for 5 years    Types: Cigarettes, Cigars    Quit date: 09/08/1977  . Smokeless tobacco: Never Used  . Alcohol Use: No  . Drug Use: No  . Sexual Activity: Not on file   Other Topics Concern  . Not on file   Social History Narrative    ROS: no fevers or chills, productive cough, hemoptysis, dysphasia, odynophagia, melena, hematochezia, dysuria, hematuria, rash, seizure activity, orthopnea, PND, pedal edema, claudication. Remaining systems are negative.  Physical Exam: Well-developed well-nourished in no acute distress.  Skin is warm and dry.  HEENT is  normal.  Neck is supple.  Chest is clear to auscultation with normal expansion.  Cardiovascular exam is regular rate and rhythm.  Abdominal exam nontender or distended. No masses palpated. Extremities show no edema. neuro grossly intact  ECG sinus rhythm with first-degree AV block. Cannot rule out prior inferior infarct.

## 2014-07-24 NOTE — Assessment & Plan Note (Signed)
Continue midodrine  

## 2014-07-24 NOTE — Assessment & Plan Note (Signed)
Continue statin. 

## 2014-07-24 NOTE — Patient Instructions (Signed)
Your physician wants you to follow-up in: ONE YEAR WITH DR CRENSHAW You will receive a reminder letter in the mail two months in advance. If you don't receive a letter, please call our office to schedule the follow-up appointment.  

## 2014-07-24 NOTE — Assessment & Plan Note (Signed)
Continue SBE prophylaxis. 

## 2014-07-24 NOTE — Assessment & Plan Note (Signed)
Schedule follow-up abdominal ultrasound in July.

## 2014-08-05 ENCOUNTER — Other Ambulatory Visit: Payer: Self-pay | Admitting: Family Medicine

## 2014-08-19 ENCOUNTER — Other Ambulatory Visit: Payer: Self-pay | Admitting: Family Medicine

## 2014-08-26 ENCOUNTER — Other Ambulatory Visit: Payer: Self-pay | Admitting: Family Medicine

## 2014-09-09 ENCOUNTER — Other Ambulatory Visit: Payer: Self-pay | Admitting: Family Medicine

## 2014-09-09 MED ORDER — MIDODRINE HCL 2.5 MG PO TABS: ORAL_TABLET | ORAL | Status: DC

## 2014-09-09 MED ORDER — PANTOPRAZOLE SODIUM 40 MG PO TBEC: DELAYED_RELEASE_TABLET | ORAL | Status: DC

## 2014-09-09 MED ORDER — PRAVASTATIN SODIUM 40 MG PO TABS: 40.0000 mg | ORAL_TABLET | Freq: Every day | ORAL | Status: DC

## 2014-09-12 ENCOUNTER — Other Ambulatory Visit: Payer: Self-pay | Admitting: *Deleted

## 2014-09-12 MED ORDER — PREDNISONE 10 MG PO TABS: 10.0000 mg | ORAL_TABLET | Freq: Every day | ORAL | Status: DC

## 2014-09-16 ENCOUNTER — Encounter: Payer: Self-pay | Admitting: Family

## 2014-09-17 ENCOUNTER — Encounter: Payer: Self-pay | Admitting: Family

## 2014-09-17 ENCOUNTER — Ambulatory Visit (INDEPENDENT_AMBULATORY_CARE_PROVIDER_SITE_OTHER): Payer: Medicare Other | Admitting: Family

## 2014-09-17 ENCOUNTER — Ambulatory Visit (HOSPITAL_COMMUNITY)
Admission: RE | Admit: 2014-09-17 | Discharge: 2014-09-17 | Disposition: A | Discharge status: Home / Self Care | Payer: Medicare Other | Source: Ambulatory Visit | Attending: Vascular Surgery | Admitting: Vascular Surgery

## 2014-09-17 VITALS — BP 134/75 | HR 68 | Temp 97.5°F | Resp 16 | Ht 73.0 in | Wt 205.0 lb

## 2014-09-17 DIAGNOSIS — I714 Abdominal aortic aneurysm, without rupture, unspecified: Secondary | ICD-10-CM

## 2014-09-17 DIAGNOSIS — R0989 Other specified symptoms and signs involving the circulatory and respiratory systems: Secondary | ICD-10-CM

## 2014-09-17 NOTE — Progress Notes (Signed)
[facility] [place]  Established Abdominal Aortic Aneurysm  History of Present Illness  [name] is a 80 y.o. (February 25, 1934) male patient of Dr. Arbie Cookey with multiple medical problems. He had a recent CT scan to evaluate possible bladder mass. He did have a large prostate up to 7 cm. Also had an incidental finding of a 3 cm infrarenal abdominal aortic aneurysm.  He returns today for follow up of his small AAA. He denies any known family history of AAA. He has a history of aortic valve replacement in 2011. He is on chronic oxygen therapy for severe pulmonary fibrosis. He had no back pain until he went bowling recently and slightly strained his back, denies abdominal pain. Pt denies any history of stroke or TIA. He stopped smoking in the 1970's. He denies claudication symptoms with walking.  Pt Diabetic: No Pt smoker: former smoker, quit in the 1970's  Past Medical History  Diagnosis Date  . Hypertension   . Hyperlipidemia   . Aortic stenosis     AVR 2011  . Dermatitis     poison ivy  . Cleft palate   . Interstitial lung disease   . Pulmonary fibrosis   . Syncope and collapse 07/18/2014  . GERD (gastroesophageal reflux disease)   . AAA (abdominal aortic aneurysm)    Past Surgical History  Procedure Laterality Date  . Bilat cataract sx  2004  . Cholecystectomy  1999  . 7 surgeries for cleft palate  1938  . Oral surgery with bone graft from hip for cleft palate  1989  . Right groin hernia repair  1950's  . Rotator cuff tear, left shoulder    . Aortic valve replacement w/ pericardial tissue valve  10-15-09    Dr Barry Dienes  . Eye surgery     Social History Social History   Social History  . Marital Status: Married    Spouse Name: N/A  . Number of Children: N/A  . Years of Education: N/A   Occupational History  . Retired    Social History Main Topics  . Smoking status: Former Smoker -- 1.50 packs/day for 5 years    Types: Cigarettes, Cigars     Quit date: 09/08/1977  . Smokeless tobacco: Never Used  . Alcohol Use: No  . Drug Use: No  . Sexual Activity: Not on file   Other Topics Concern  . Not on file   Social History Narrative   Family History Family History  Problem Relation Age of Onset  . Heart attack Mother   . Cancer Father     lung and stomach  . Heart attack Father   . Cancer Sister     bone  . Diabetes Sister   . Heart murmur Brother   . Hypertension Brother   . Hyperlipidemia Brother   . Heart attack Brother     Current Outpatient Prescriptions on File Prior to Visit  Medication Sig Dispense Refill  . AMBULATORY NON FORMULARY MEDICATION Rollator rolling walker with seat.  Diagnosis Physical Deconditioning 799.3 1 Units 0  . aspirin 81 MG tablet Take 81 mg by mouth every morning.     . ferrous sulfate 325 (65 FE) MG EC tablet Take 1 tablet (325 mg total) by mouth daily with breakfast. 90 tablet 3  . finasteride (PROSCAR) 5 MG tablet Take 1 tablet (5 mg total) by mouth daily. 90 tablet 1  . loratadine (CLARITIN) 10 MG tablet Take 10 mg by mouth every morning.     Marland Kitchen  midodrine (PROAMATINE) 2.5 MG tablet TAKE 1 TABLET BY MOUTH TWICE DAILY WITH A MEAL 180 tablet 0  . Multiple Vitamin (MULTIVITAMIN WITH MINERALS) TABS tablet Take 1 tablet by mouth every morning.     . pantoprazole (PROTONIX) 40 MG tablet TAKE 1 TABLET BY MOUTH TWICE DAILY BEFORE A MEAL 180 tablet 2  . pantoprazole (PROTONIX) 40 MG tablet TAKE 1 TABLET BY MOUTH TWICE DAILY BEFORE A MEAL 120 tablet 1  . predniSONE (DELTASONE) 10 MG tablet Take 1 tablet (10 mg total) by mouth daily with breakfast. 90 tablet 2  . Multiple Vitamin (MULTIVITAMIN) tablet Take 1 tablet by mouth daily.    . NON FORMULARY Place 4 L into the nose daily. 3-4 liters and with activity 6 liters    . pravastatin (PRAVACHOL) 40 MG tablet Take 1 tablet (40 mg total) by mouth at bedtime. (Patient not taking: Reported on 09/17/2014) 90 tablet 0  . [DISCONTINUED] tadalafil (CIALIS) 20  MG tablet Take 20 mg by mouth daily as needed. Take one about 1-2 hours before sexual intercourse      No current facility-administered medications on file prior to visit.   Allergies  Allergen Reactions  . Pravastatin Sodium Other (See Comments)    Sneezing episodes, can take medication and tolerates reaction.  . Pirfenidone Nausea And Vomiting    ROS: See HPI for pertinent positives and negatives.  Physical Examination  Filed Vitals:   09/17/14 0907  BP: 134/75  Pulse: 68  Temp: 97.5 F (36.4 C)  TempSrc: Oral  Resp: 16  Height: 6\' 1"  (1.854 m)  Weight: 205 lb (92.987 kg)  SpO2: 98%   Body mass index is 27.05 kg/(m^2).  General: A&O x 3, WD.  Pulmonary: Sym exp, limited air movt, CTAB, + rales in left base, no rhonchi, or wheezing. Using supplemental oxygen via concentrator at 1 L/min/ [place]  Cardiac: RRR, Nl S1, S2, no detected murmur.   Carotid Bruits Right Left   Positive Negative   Aorta is not palpable Radial pulses are 2+ palpable and =                          VASCULAR EXAM:                                                                                                         LE Pulses Right Left       FEMORAL  2+ palpable  2+ palpable        POPLITEAL  not palpable   not palpable       POSTERIOR TIBIAL  not palpable   not palpable        DORSALIS PEDIS      ANTERIOR TIBIAL 2+ palpable  2+ palpable      Gastrointestinal: soft, NTND, -G/R, - HSM, - masses palpated, - CVAT B.  Musculoskeletal: M/S 5/5 in legs, 4/5 in upper extremities, Extremities without ischemic changes.  Neurologic: CN 2-12 intact, Pain and light touch intact in extremities are intact, Motor exam as  listed above.  Non-Invasive Vascular Imaging  AAA Duplex (09/17/2014) ABDOMINAL AORTA DUPLEX EVALUATION    INDICATION: Abdominal aortic aneurysm    PREVIOUS INTERVENTION(S):     DUPLEX EXAM:     LOCATION DIAMETER AP (cm) DIAMETER TRANSVERSE (cm) VELOCITIES (cm/sec)  Aorta  Proximal Not Visualized  Not Visualized    Aorta Mid Not Visualized  Not Visualized    Aorta Distal 3.0 3.0 102  Right Common Iliac Artery 1.3 1.2 105  Left Common Iliac Artery 1.3 1.4 95    Previous max aortic diameter:  3.0cm Date: 07/29/13 (CT)     ADDITIONAL FINDINGS: . Decreased visualization of the abdominal vasculature due to overlying bowel gas. . No hemodynamically significant stenosis of the abdominal aorta and bilateral proximal common iliac arteries.    IMPRESSION: Aneurysmal dilatation of the distal abdominal aorta with a maximum diameter of 3.0cm.    Compared to the previous exam:  No significant change in the abdominal aortic aneurysm when compared to the previous exam.      Medical Decision Making  The patient is a 80 y.o. male who presents with asymptomatic small AAA with no increase in size. Right carotid bruit present, pt has no history of stroke or TIA.  Based on this patient's exam and diagnostic studies, the patient will follow up in 1 year  with the following studies: AAA Duplex and carotid Duplex.  Consideration for repair of AAA would be made when the size is 5.5 cm, growth > 1 cm/yr, and symptomatic status.  I emphasized the importance of maximal medical management including strict control of blood pressure, blood glucose, and lipid levels, antiplatelet agents, obtaining regular exercise, and continued cessation of smoking.   The patient was given information about AAA including signs, symptoms, treatment, and how to minimize the risk of enlargement and rupture of aneurysms.    The patient was advised to call [phone] should the patient experience sudden onset abdominal or back pain.   Thank you for allowing [place] to participate in this patient's care.  Charisse March, RN, MSN, FNP-C [facility] Office: [phone]  Clinic Physician: Hart Rochester  09/17/2014, 9:23 AM

## 2014-09-17 NOTE — Patient Instructions (Signed)
Abdominal Aortic Aneurysm An aneurysm is a weakened or damaged part of an artery wall that bulges from the normal force of blood pumping through the body. An abdominal aortic aneurysm is an aneurysm that occurs in the lower part of the aorta, the main artery of the body.  The major concern with an abdominal aortic aneurysm is that it can enlarge and burst (rupture) or blood can flow between the layers of the wall of the aorta through a tear (aorticdissection). Both of these conditions can cause bleeding inside the body and can be life threatening unless diagnosed and treated promptly. CAUSES  The exact cause of an abdominal aortic aneurysm is unknown. Some contributing factors are:   A hardening of the arteries caused by the buildup of fat and other substances in the lining of a blood vessel (arteriosclerosis).  Inflammation of the walls of an artery (arteritis).   Connective tissue diseases, such as Marfan syndrome.   Abdominal trauma.   An infection, such as syphilis or staphylococcus, in the wall of the aorta (infectious aortitis) caused by bacteria. RISK FACTORS  Risk factors that contribute to an abdominal aortic aneurysm may include:  Age older than 60 years.   High blood pressure (hypertension).  Male gender.  Ethnicity (white race).  Obesity.  Family history of aneurysm (first degree relatives only).  Tobacco use. PREVENTION  The following healthy lifestyle habits may help decrease your risk of abdominal aortic aneurysm:  Quitting smoking. Smoking can raise your blood pressure and cause arteriosclerosis.  Limiting or avoiding alcohol.  Keeping your blood pressure, blood sugar level, and cholesterol levels within normal limits.  Decreasing your salt intake. In somepeople, too much salt can raise blood pressure and increase your risk of abdominal aortic aneurysm.  Eating a diet low in saturated fats and cholesterol.  Increasing your fiber intake by including  whole grains, vegetables, and fruits in your diet. Eating these foods may help lower blood pressure.  Maintaining a healthy weight.  Staying physically active and exercising regularly. SYMPTOMS  The symptoms of abdominal aortic aneurysm may vary depending on the size and rate of growth of the aneurysm.Most grow slowly and do not have any symptoms. When symptoms do occur, they may include:  Pain (abdomen, side, lower back, or groin). The pain may vary in intensity. A sudden onset of severe pain may indicate that the aneurysm has ruptured.  Feeling full after eating only small amounts of food.  Nausea or vomiting or both.  Feeling a pulsating lump in the abdomen.  Feeling faint or passing out. DIAGNOSIS  Since most unruptured abdominal aortic aneurysms have no symptoms, they are often discovered during diagnostic exams for other conditions. An aneurysm may be found during the following procedures:  Ultrasonography (A one-time screening for abdominal aortic aneurysm by ultrasonography is also recommended for all men aged 65-75 years who have ever smoked).  X-ray exams.  A computed tomography (CT).  Magnetic resonance imaging (MRI).  Angiography or arteriography. TREATMENT  Treatment of an abdominal aortic aneurysm depends on the size of your aneurysm, your age, and risk factors for rupture. Medication to control blood pressure and pain may be used to manage aneurysms smaller than 6 cm. Regular monitoring for enlargement may be recommended by your caregiver if:  The aneurysm is 3-4 cm in size (an annual ultrasonography may be recommended).  The aneurysm is 4-4.5 cm in size (an ultrasonography every 6 months may be recommended).  The aneurysm is larger than 4.5 cm in   size (your caregiver may ask that you be examined by a vascular surgeon). If your aneurysm is larger than 6 cm, surgical repair may be recommended. There are two main methods for repair of an aneurysm:   Endovascular  repair (a minimally invasive surgery). This is done most often.  Open repair. This method is used if an endovascular repair is not possible. Document Released: 10/21/2004 Document Revised: 05/08/2012 Document Reviewed: 02/11/2012 Exit[employer] Patient Information 2015 [employer]. This information is not intended to replace advice given to you by your health care provider. Make sure you discuss any questions you have with your health care provider.  

## 2014-09-23 ENCOUNTER — Encounter: Payer: Self-pay | Admitting: Family Medicine

## 2014-09-23 ENCOUNTER — Ambulatory Visit (INDEPENDENT_AMBULATORY_CARE_PROVIDER_SITE_OTHER): Payer: Medicare Other | Admitting: Family Medicine

## 2014-09-23 VITALS — BP 117/71 | HR 84 | Wt 207.0 lb

## 2014-09-23 DIAGNOSIS — R05 Cough: Secondary | ICD-10-CM | POA: Diagnosis not present

## 2014-09-23 DIAGNOSIS — R059 Cough, unspecified: Secondary | ICD-10-CM

## 2014-09-23 MED ORDER — AZITHROMYCIN 250 MG PO TABS: ORAL_TABLET | ORAL | Status: AC

## 2014-09-23 NOTE — Progress Notes (Signed)
CC: [name] is a 80 y.o. male is here for cough and congestion   Subjective: HPI:  Nonproductive cough and wheezing for the past week. Symptoms have been persistent and are mild to moderate in severity. Symptoms came on gradually. He's had some mild shortness of breath but otherwise feels like he is in his regular state of health. Mild benefit from Coricidin but no other interventions as of yet. Symptoms are present all hours of the day. He continues to use oxygen at 4 L around-the-clock. He denies fevers, chills, blood in sputum, productive cough, no chest pain.      Review Of Systems Outlined In HPI  Past Medical History  Diagnosis Date  . Hypertension   . Hyperlipidemia   . Aortic stenosis     AVR 2011  . Dermatitis     poison ivy  . Cleft palate   . Interstitial lung disease   . Pulmonary fibrosis   . Syncope and collapse 07/18/2014  . GERD (gastroesophageal reflux disease)   . AAA (abdominal aortic aneurysm)     Past Surgical History  Procedure Laterality Date  . Bilat cataract sx  2004  . Cholecystectomy  1999  . 7 surgeries for cleft palate  1938  . Oral surgery with bone graft from hip for cleft palate  1989  . Right groin hernia repair  1950's  . Rotator cuff tear, left shoulder    . Aortic valve replacement w/ pericardial tissue valve  10-15-09    Dr Barry Dienes  . Eye surgery     Family History  Problem Relation Age of Onset  . Heart attack Mother   . Cancer Father     lung and stomach  . Heart attack Father   . Cancer Sister     bone  . Diabetes Sister   . Heart murmur Brother   . Hypertension Brother   . Hyperlipidemia Brother   . Heart attack Brother     Social History   Social History  . Marital Status: Married    Spouse Name: N/A  . Number of Children: N/A  . Years of Education: N/A   Occupational History  . Retired    Social History Main Topics  . Smoking status: Former Smoker -- 1.50 packs/day for 5 years    Types: Cigarettes,  Cigars    Quit date: 09/08/1977  . Smokeless tobacco: Never Used  . Alcohol Use: No  . Drug Use: No  . Sexual Activity: Not on file   Other Topics Concern  . Not on file   Social History Narrative     Objective: BP 117/71 mmHg  Pulse 84  Wt 207 lb (93.895 kg)  SpO2 96%  General: Alert and Oriented, No Acute Distress HEENT: Pupils equal, round, reactive to light. Conjunctivae clear.  External ears unremarkable, canals clear with intact TMs with appropriate landmarks.  Middle ear appears open without effusion. Pink inferior turbinates.  Moist mucous membranes, pharynx without inflammation nor lesions.  Neck supple without palpable lymphadenopathy nor abnormal masses. Lungs:Comfortable work of breathing with trace rails in the posterior lung fields, trace expiratory wheezing, no rhonchi.  Cardiac: Regular rate and rhythm. Normal S1/S2.  No murmurs, rubs, nor gallops.   Extremities: No peripheral edema.  Strong peripheral pulses.  Mental Status: No depression, anxiety, nor agitation. Skin: Warm and dry.  Assessment & Plan: Willman was seen today for cough and congestion.  Diagnoses and all orders for this visit:  Cough -  azithromycin (ZITHROMAX) 250 MG tablet; Take two tabs at once on day 1, then one tab daily on days 2-5.   Azithromycin provided for cough and wheezing, he also received a injection of Depo-Medrol today. I've asked him to call me if no better by Wednesday.Signs and symptoms requring emergent/urgent reevaluation were discussed with the patient.   Return if symptoms worsen or fail to improve.

## 2014-09-26 ENCOUNTER — Encounter: Payer: Self-pay | Admitting: Family Medicine

## 2014-09-26 ENCOUNTER — Ambulatory Visit (INDEPENDENT_AMBULATORY_CARE_PROVIDER_SITE_OTHER): Payer: Medicare Other | Admitting: Family Medicine

## 2014-09-26 ENCOUNTER — Ambulatory Visit (INDEPENDENT_AMBULATORY_CARE_PROVIDER_SITE_OTHER): Payer: Medicare Other

## 2014-09-26 VITALS — BP 109/65 | HR 84 | Temp 98.0°F | Wt 205.0 lb

## 2014-09-26 DIAGNOSIS — R05 Cough: Secondary | ICD-10-CM

## 2014-09-26 DIAGNOSIS — R0902 Hypoxemia: Secondary | ICD-10-CM

## 2014-09-26 DIAGNOSIS — R059 Cough, unspecified: Secondary | ICD-10-CM

## 2014-09-26 DIAGNOSIS — K449 Diaphragmatic hernia without obstruction or gangrene: Secondary | ICD-10-CM

## 2014-09-26 DIAGNOSIS — R972 Elevated prostate specific antigen [PSA]: Secondary | ICD-10-CM | POA: Diagnosis not present

## 2014-09-26 DIAGNOSIS — N138 Other obstructive and reflux uropathy: Secondary | ICD-10-CM | POA: Diagnosis not present

## 2014-09-26 DIAGNOSIS — N401 Enlarged prostate with lower urinary tract symptoms: Secondary | ICD-10-CM | POA: Diagnosis not present

## 2014-09-26 DIAGNOSIS — R351 Nocturia: Secondary | ICD-10-CM | POA: Diagnosis not present

## 2014-09-26 DIAGNOSIS — J8489 Other specified interstitial pulmonary diseases: Secondary | ICD-10-CM | POA: Diagnosis not present

## 2014-09-26 MED ORDER — PREDNISONE 20 MG PO TABS: ORAL_TABLET | ORAL | Status: AC

## 2014-09-26 NOTE — Progress Notes (Signed)
CC: [name] is a 80 y.o. male is here for f/u congestion   Subjective: HPI:  Follow-up cough: Since receiving Depo-Medrol and taking 4 doses of azithromycin he tells me he only feels about 13% better. Shortness of breath is not bothering him as much now but he still has a nonproductive dry cough. He is also felt a little bit of chest congestion and fatigue. No longer having any upper respiratory complaints such as nasal congestion and ear pain or postnasal drip. Denies wheezing, blood in sputum, or exertional chest pain. Denies rash fevers chills or fatigue. No new interventions. Symptoms are present all hours of the day and mild to moderate in severity.   Review Of Systems Outlined In HPI  Past Medical History  Diagnosis Date  . Hypertension   . Hyperlipidemia   . Aortic stenosis     AVR 2011  . Dermatitis     poison ivy  . Cleft palate   . Interstitial lung disease   . Pulmonary fibrosis   . Syncope and collapse 07/18/2014  . GERD (gastroesophageal reflux disease)   . AAA (abdominal aortic aneurysm)     Past Surgical History  Procedure Laterality Date  . Bilat cataract sx  2004  . Cholecystectomy  1999  . 7 surgeries for cleft palate  1938  . Oral surgery with bone graft from hip for cleft palate  1989  . Right groin hernia repair  1950's  . Rotator cuff tear, left shoulder    . Aortic valve replacement w/ pericardial tissue valve  10-15-09    Dr [name]  . Eye surgery     Family History  Problem Relation Age of Onset  . Heart attack Mother   . Cancer Father     lung and stomach  . Heart attack Father   . Cancer Sister     bone  . Diabetes Sister   . Heart murmur Brother   . Hypertension Brother   . Hyperlipidemia Brother   . Heart attack Brother     Social History   Social History  . Marital Status: Married    Spouse Name: N/A  . Number of Children: N/A  . Years of Education: N/A   Occupational History  . Retired    Social History Main Topics   . Smoking status: Former Smoker -- 1.50 packs/day for 5 years    Types: Cigarettes, Cigars    Quit date: 09/08/1977  . Smokeless tobacco: Never Used  . Alcohol Use: No  . Drug Use: No  . Sexual Activity: Not on file   Other Topics Concern  . Not on file   Social History Narrative     Objective: BP 109/65 mmHg  Pulse 84  Temp(Src) 98 F (36.7 C) (Oral)  Wt 205 lb (92.987 kg)  SpO2 95%  General: Alert and Oriented, No Acute Distress HEENT: Pupils equal, round, reactive to light. Conjunctivae clear.  External ears unremarkable, canals clear with intact TMs with appropriate landmarks.  Middle ear appears open without effusion. Pink inferior turbinates.  Moist mucous membranes, pharynx without inflammation nor lesions.  Neck supple without palpable lymphadenopathy nor abnormal masses. Lungs: Comfortable work of breathing without rhonchi or wheezing. His typical Rales in all lung fields are present. Cardiac: Regular rate and rhythm. Normal S1/S2.  No murmurs, rubs, nor gallops.   Extremities: No peripheral edema.  Strong peripheral pulses.  Mental Status: No depression, anxiety, nor agitation. Skin: Warm and dry.  Assessment & Plan: [name]  was seen today for f/u congestion.  Diagnoses and all orders for this visit:  Cough -     DG Chest 2 View; Future  Hypoxemia -     DG Chest 2 View; Future  Other orders -     predniSONE (DELTASONE) 20 MG tablet; Three tabs daily days 1-3, two tabs daily days 4-6, one tab daily days 7-9, half tab daily days 10-13.   Given cough and persistent symptoms in the setting of chronic lung disease and x-ray was obtained showing no acute process and was overall reassuring. I see no need for extending his antibiotic spelled it would benefit him to start on a prednisone taper. He expressed understanding that once he gets down to 10 mg daily he should switch to his chronic 10 mg daily which he does not have to take during the first 9 days of the  taper.Signs and symptoms requring emergent/urgent reevaluation were discussed with the patient.  Return if symptoms worsen or fail to improve.

## 2014-10-02 ENCOUNTER — Encounter: Payer: Self-pay | Admitting: Family Medicine

## 2014-10-02 DIAGNOSIS — N400 Enlarged prostate without lower urinary tract symptoms: Secondary | ICD-10-CM | POA: Insufficient documentation

## 2014-10-24 ENCOUNTER — Ambulatory Visit (INDEPENDENT_AMBULATORY_CARE_PROVIDER_SITE_OTHER): Payer: Medicare Other | Admitting: Family Medicine

## 2014-10-24 ENCOUNTER — Encounter: Payer: Self-pay | Admitting: Family Medicine

## 2014-10-24 VITALS — BP 136/73 | HR 80 | Temp 98.3°F | Wt 208.0 lb

## 2014-10-24 DIAGNOSIS — J029 Acute pharyngitis, unspecified: Secondary | ICD-10-CM | POA: Diagnosis not present

## 2014-10-24 MED ORDER — TRIAMCINOLONE ACETONIDE 55 MCG/ACT NA AERO: 2.0000 | INHALATION_SPRAY | Freq: Every day | NASAL | Status: DC

## 2014-10-24 NOTE — Progress Notes (Signed)
CC: [name] is a 80 y.o. male is here for Sore Throat   Subjective: HPI:  Sore throat on a daily basis for the past week not getting better or worse overall but is slightly improved for a few hours after gargling salt water. Nonradiating. States he feels like his normal self otherwise. Interventions also included numbing spray but this did not help. Symptoms are mild in severity. Denies choking, difficulty swallowing, wheezing, shortness of breath, chest pain, nasal congestion or swollen lymph nodes   Review Of Systems Outlined In HPI  Past Medical History  Diagnosis Date  . Hypertension   . Hyperlipidemia   . Aortic stenosis     AVR 2011  . Dermatitis     poison ivy  . Cleft palate   . Interstitial lung disease   . Pulmonary fibrosis   . Syncope and collapse 07/18/2014  . GERD (gastroesophageal reflux disease)   . AAA (abdominal aortic aneurysm)     Past Surgical History  Procedure Laterality Date  . Bilat cataract sx  2004  . Cholecystectomy  1999  . 7 surgeries for cleft palate  1938  . Oral surgery with bone graft from hip for cleft palate  1989  . Right groin hernia repair  1950's  . Rotator cuff tear, left shoulder    . Aortic valve replacement w/ pericardial tissue valve  10-15-09    Dr Barry Dienes  . Eye surgery     Family History  Problem Relation Age of Onset  . Heart attack Mother   . Cancer Father     lung and stomach  . Heart attack Father   . Cancer Sister     bone  . Diabetes Sister   . Heart murmur Brother   . Hypertension Brother   . Hyperlipidemia Brother   . Heart attack Brother     Social History   Social History  . Marital Status: Married    Spouse Name: N/A  . Number of Children: N/A  . Years of Education: N/A   Occupational History  . Retired    Social History Main Topics  . Smoking status: Former Smoker -- 1.50 packs/day for 5 years    Types: Cigarettes, Cigars    Quit date: 09/08/1977  . Smokeless tobacco: Never Used  .  Alcohol Use: No  . Drug Use: No  . Sexual Activity: Not on file   Other Topics Concern  . Not on file   Social History Narrative     Objective: BP 136/73 mmHg  Pulse 80  Temp(Src) 98.3 F (36.8 C) (Oral)  Wt 208 lb (94.348 kg)  General: Alert and Oriented, No Acute Distress HEENT: Pupils equal, round, reactive to light. Conjunctivae clear.  External ears unremarkable, canals clear with intact TMs with appropriate landmarks.  Middle ear appears open without effusion. Pink inferior turbinates.  Moist mucous membranes, pharynx without inflammation nor lesions other than cobblestoning.  Neck supple without palpable lymphadenopathy nor abnormal masses. Extremities: No peripheral edema.  Strong peripheral pulses.  Mental Status: No depression, anxiety, nor agitation. Skin: Warm and dry.  Assessment & Plan: [name] was seen today for sore throat.  Diagnoses and all orders for this visit:  Sore throat  Other orders -     triamcinolone (NASACORT ALLERGY 24HR) 55 MCG/ACT AERO nasal inhaler; Place 2 sprays into the nose daily.  Sore throat: Suspect due to postnasal drip therefore start either Flonase or Nasacort over-the-counter. Continue salt water gargles.  Return if symptoms  worsen or fail to improve.

## 2014-10-31 ENCOUNTER — Ambulatory Visit (INDEPENDENT_AMBULATORY_CARE_PROVIDER_SITE_OTHER): Payer: Medicare Other | Admitting: Internal Medicine

## 2014-10-31 ENCOUNTER — Other Ambulatory Visit: Payer: Medicare Other

## 2014-10-31 ENCOUNTER — Encounter: Payer: Self-pay | Admitting: Internal Medicine

## 2014-10-31 VITALS — BP 130/66 | HR 75 | Ht 73.0 in | Wt 206.0 lb

## 2014-10-31 DIAGNOSIS — J8410 Pulmonary fibrosis, unspecified: Secondary | ICD-10-CM | POA: Diagnosis not present

## 2014-10-31 LAB — RHEUMATOID FACTOR

## 2014-10-31 NOTE — Patient Instructions (Addendum)
ICD-9-CM ICD-10-CM   1. Interstitial pulmonary fibrosis (HCC) 515 J84.10    STable but need to restage You are uptodate with vaccines  PLAN Do K-BILD questions - before you go Do HRCT  Chest Do full PFT At followup (not today) do walk test on room air after being off o2 for 20 minutes DO autoimmune lab panel  - ANA, DS-DNA, RF, CCP, SCl-70,   Followu  - next 4-6 weeks (till then continue o2 and pred as before)

## 2014-10-31 NOTE — Progress Notes (Signed)
Subjective:     Patient ID: [name], male   DOB: Aug 14, 1934, 80 y.o.   MRN: [id number]  HPI  Ov 10/31/2014  Chief Complaint  Patient presents with  . Follow-up    Former PW pt here for follow up. pt states he is doing well, breathing is good. no c/o SOB, chest tightness, wheezing or cough.  pt on contiuous O2 set at 4LPM. 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 Transfer of care from Dr. Shan Levans to myself Dr. Marchelle Gearing after Dr. Shan Levans has retired from the pulmonary practice. According to the chart and patient and his wife [his wife works at [employer]) . He has a diagnosis of pulmonary fibrosis/Boop since at least 2014 based on CT scan of the chest [I personally visualized this]. I do not find evidence of a surgical lung biopsy. He is been on prednisone since then. According to his wife he was on pirefnidone from summer 2014 to early 2015 through the expanded access program. He developed significant weight loss and had to be hospitalized. After that his life is stabilized. His chronic dyspnea on exertion with mild chronic cough. Relieved by rest. He uses oxygen anywhere between 4 and 6 L. However he says that one day when he checked it on room air is pulse ox was 89% for 12 hours and therefore he questions whether he needs oxygen. In fact his most recent chest x-ray 09/26/2014 shows evidence of some chronic ILD but otherwise clear lung fields. He has been to pulmonary rehabilitation before he has never attended the pulmonary fibrosis support group but is interested in hearing on email from Mr. Army Chaco president of the support group.   Lab work from 2014 May 22 reveals highly positive ANA titer of 1: 1280 but normal rheumatoid factor, IgE, SSA, SSB and scleroderma 70 but he does have some and on symptoms. He also had blood allergy panel at this time which was negative    K-BILD ILD QUESTIONNAIRE, Symptom score over prior 2 weeks  - shows he is very  functional with very little complaints  7-none, 6-rarely, 5-occ, 5-some times, 3-sev times, 2-most times, 1-every time 10/31/2014   Dyspnea for stairs, incline or hill 7  Chest Tightness 7  Worry about seriousness of lung complaint x  Avoided doing things that make you dyspneic 6  Have you felt loss of control of lung condition (reversed from original) 7  Felt fed up due to lung condition 6  Felt urge to breathe aka air hunger 7  Has lung condition made you feel anxious 7  How often have you experienced wheezing or whistling sound 7  How much of the time have you felt your lung dz is getting worse 7  How much has your lung condition interfered with job or daily task 7  Were you expecting your lung condition to get worse 7  How much has your lung function limited you carrying things like groceris 7  How much has your lung function made you think of EOL? 7  Total   Are you financially worse off 6  Grand Total      Immunization History  Administered Date(s) Administered  . Influenza Split 10/26/2011, 10/21/2014  . Influenza Whole 09/27/2012  . Influenza,inj,Quad PF,36+ Mos 09/28/2013  . Pneumococcal Conjugate-13 09/28/2013  . Pneumococcal Polysaccharide-23 09/25/2009  . Tdap 01/25/2011  . Zoster 10/25/2012     Review of Systems   Positive for dyspnea and oxygen use  Objective:   Physical Exam  Constitutional: He is oriented to person, place, and time. He appears well-developed and well-nourished. No distress.  HENT:  Head: Normocephalic and atraumatic.  Right Ear: External ear normal.  Left Ear: External ear normal.  Mouth/Throat: Oropharynx is clear and moist. No oropharyngeal exudate.  Oxygen use Has a voice of someone was had cleft lip repair  Eyes: Conjunctivae and EOM are normal. Pupils are equal, round, and reactive to light. Right eye exhibits no discharge. Left eye exhibits no discharge. No scleral icterus.  Neck: Normal range of motion. Neck supple. No JVD  present. No tracheal deviation present. No thyromegaly present.  Cardiovascular: Normal rate, regular rhythm and intact distal pulses.  Exam reveals no gallop and no friction rub.   No murmur heard. Pulmonary/Chest: Effort normal. No respiratory distress. He has no wheezes. He has rales. He exhibits no tenderness.  Abdominal: Soft. Bowel sounds are normal. He exhibits no distension and no mass. There is no tenderness. There is no rebound and no guarding.  Musculoskeletal: Normal range of motion. He exhibits no edema or tenderness.  Somewhat of a sclerodactyly/raynbaud fingers  Lymphadenopathy:    He has no cervical adenopathy.  Neurological: He is alert and oriented to person, place, and time. He has normal reflexes. No cranial nerve deficit. Coordination normal.  Skin: Skin is warm and dry. No rash noted. He is not diaphoretic. No erythema. No pallor.  Psychiatric: He has a normal mood and affect. His behavior is normal. Judgment and thought content normal.  Nursing note and vitals reviewed.   Filed Vitals:   10/31/14 1350  BP: 130/66  Pulse: 75  Height:  (1.854 m)  Weight: 206 lb (93.441 kg)  SpO2: 94%         Assessment:       ICD-9-CM ICD-10-CM   1. Interstitial pulmonary fibrosis (HCC) 515 J84.10         Plan:      STable but need to restage You are uptodate with vaccines  PLAN Do HRCT  Chest Do full PFT At followup (not today) do walk test on room air after being off o2 for 20 minutes DO autoimmune lab panel  - ANA, DS-DNA, RF, CCP, SCl-70,   Followu  - next 4-6 weeks (till then continue o2 and pred as before)   Dr. Kalman Shan, M.D., [facility].C.P Pulmonary and Critical Care Medicine Staff Physician  System [facility] Pager: [phone], If no answer or between  15:00h - 7:00h: call [phone]  10/31/2014 2:33 PM

## 2014-11-01 LAB — ANA: ANA: NEGATIVE

## 2014-11-01 LAB — ANTI-DNA ANTIBODY, DOUBLE-STRANDED: DS DNA AB: 1 [IU]/mL

## 2014-11-01 LAB — CYCLIC CITRUL PEPTIDE ANTIBODY, IGG

## 2014-11-01 LAB — ANTI-SCLERODERMA ANTIBODY: SCLERODERMA (SCL-70) (ENA) ANTIBODY, IGG: NEGATIVE

## 2014-11-07 ENCOUNTER — Ambulatory Visit (INDEPENDENT_AMBULATORY_CARE_PROVIDER_SITE_OTHER)
Admission: RE | Admit: 2014-11-07 | Discharge: 2014-11-07 | Disposition: A | Discharge status: Home / Self Care | Payer: Medicare Other | Source: Ambulatory Visit | Attending: Internal Medicine | Admitting: Internal Medicine

## 2014-11-07 DIAGNOSIS — J8410 Pulmonary fibrosis, unspecified: Secondary | ICD-10-CM

## 2014-11-07 DIAGNOSIS — R0602 Shortness of breath: Secondary | ICD-10-CM | POA: Diagnosis not present

## 2014-11-11 ENCOUNTER — Telehealth: Payer: Self-pay | Admitting: Internal Medicine

## 2014-11-11 NOTE — Telephone Encounter (Signed)
Let Mr. Roy Rainwaterobert Chan now that CT scan of the chest does show pulmonary fibrosis with her without diaphragmatic paralysis and compared to before it has gotten worse. In addition this coronary artery calcification and large hiatal hernia. I will discuss all these with him at visit in November 2016.  MPRESSION: 1. The appearance of the chest is compatible with interstitial lung disease. Given the progression of findings compared to the prior study, the development of areas of honeycombing, and the craniocaudal gradient, findings are now favored to reflect an unusual appearance of progressively worsening usual interstitial pneumonia (UIP). The asymmetry of disease is unusual (right lung involvement greater than the left), but may be related to altered ventilation/perfusion (there does appear to be elevation of the right hemidiaphragm, which could simply be a consequence of greater interstitial lung disease in the right lung, or could be indicative of a secondary process such as phrenic nerve paralysis). 2. Atherosclerosis, including left main and 3 vessel coronary artery disease. Assessment for potential risk factor modification, dietary therapy or pharmacologic therapy may be warranted, if clinically indicated. 3. Large hiatal hernia with patulous esophagus. 4. Additional incidental findings, as above.

## 2014-11-11 NOTE — Telephone Encounter (Signed)
Patient returned call and may be reached at [phone].

## 2014-11-12 NOTE — Telephone Encounter (Signed)
Pt wife returning call and csn be reached @ [phone]

## 2014-11-12 NOTE — Telephone Encounter (Signed)
Called and spoke to pt's wife and informed of CT Results per MR   Expand All Collapse All   Let Mr. Gabriel Rainwaterobert Sherrill now that CT scan of the chest does show pulmonary fibrosis with her without diaphragmatic paralysis and compared to before it has gotten worse. In addition this coronary artery calcification and large hiatal hernia. I will discuss all these with him at visit in November 2016.        Wife voiced understanding of findings  Nothing further is needed at this time

## 2014-11-20 ENCOUNTER — Ambulatory Visit (INDEPENDENT_AMBULATORY_CARE_PROVIDER_SITE_OTHER): Payer: Medicare Other | Admitting: Family Medicine

## 2014-11-20 ENCOUNTER — Encounter: Payer: Self-pay | Admitting: Family Medicine

## 2014-11-20 VITALS — BP 127/69 | HR 81 | Wt 209.0 lb

## 2014-11-20 DIAGNOSIS — R062 Wheezing: Secondary | ICD-10-CM | POA: Diagnosis not present

## 2014-11-20 MED ORDER — CEFDINIR 300 MG PO CAPS: 300.0000 mg | ORAL_CAPSULE | Freq: Two times a day (BID) | ORAL | Status: AC

## 2014-11-20 MED ORDER — ALBUTEROL SULFATE HFA 108 (90 BASE) MCG/ACT IN AERS: INHALATION_SPRAY | RESPIRATORY_TRACT | Status: DC

## 2014-11-20 MED ORDER — METHYLPREDNISOLONE ACETATE 80 MG/ML IJ SUSP
80.0000 mg | Freq: Once | INTRAMUSCULAR | Status: AC
  Administered 2014-11-20: 80 mg via INTRAMUSCULAR

## 2014-11-20 NOTE — Progress Notes (Signed)
CC: [name] is a 80 y.o. male is here for URI   Subjective: HPI:  Productive cough, rattling of the chest, and shortness of breath only when coughing that has been present since Sunday of last week periods progressively getting worse but now moderate in severity. Nothing seems to make it better or worse. Interventions have included Robitussin. Symptoms are present all hours of the day. Denies fevers, chills, pain with breathing, nasal congestion, sore throat or rash   Review Of Systems Outlined In HPI  Past Medical History  Diagnosis Date  . Hypertension   . Hyperlipidemia   . Aortic stenosis     AVR 2011  . Dermatitis     poison ivy  . Cleft palate   . Interstitial lung disease (HCC)   . Pulmonary fibrosis (HCC)   . Syncope and collapse 07/18/2014  . GERD (gastroesophageal reflux disease)   . AAA (abdominal aortic aneurysm) [facility])     Past Surgical History  Procedure Laterality Date  . Bilat cataract sx  2004  . Cholecystectomy  1999  . 7 surgeries for cleft palate  1938  . Oral surgery with bone graft from hip for cleft palate  1989  . Right groin hernia repair  1950's  . Rotator cuff tear, left shoulder    . Aortic valve replacement w/ pericardial tissue valve  10-15-09    Dr Barry Dienes  . Eye surgery     Family History  Problem Relation Age of Onset  . Heart attack Mother   . Cancer Father     lung and stomach  . Heart attack Father   . Cancer Sister     bone  . Diabetes Sister   . Heart murmur Brother   . Hypertension Brother   . Hyperlipidemia Brother   . Heart attack Brother     Social History   Social History  . Marital Status: Married    Spouse Name: N/A  . Number of Children: N/A  . Years of Education: N/A   Occupational History  . Retired    Social History Main Topics  . Smoking status: Former Smoker -- 1.50 packs/day for 5 years    Types: Cigarettes, Cigars    Quit date: 09/08/1977  . Smokeless tobacco: Never Used  . Alcohol Use: No   . Drug Use: No  . Sexual Activity: Not on file   Other Topics Concern  . Not on file   Social History Narrative     Objective: BP 127/69 mmHg  Pulse 81  Wt 209 lb (94.802 kg)  General: Alert and Oriented, No Acute Distress HEENT: Pupils equal, round, reactive to light. Conjunctivae clear.  External ears unremarkable, canals clear with intact TMs with appropriate landmarks.  Middle ear appears open without effusion. Pink inferior turbinates.  Moist mucous membranes, pharynx without inflammation nor lesions.  Neck supple without palpable lymphadenopathy nor abnormal masses. Lungs: Comfortable work of breathing with trace expiratory rails and trace expiratory rhonchi in the central lung fields. No signs of consolidation Cardiac: Regular rate and rhythm. Normal S1/S2.  No murmurs, rubs, nor gallops.   Extremities: No peripheral edema.  Strong peripheral pulses.  Mental Status: No depression, anxiety, nor agitation. Skin: Warm and dry.  Assessment & Plan: Tevion was seen today for uri.  Diagnoses and all orders for this visit:  Wheezing -     albuterol (PROVENTIL HFA;VENTOLIN HFA) 108 (90 BASE) MCG/ACT inhaler; Inhale two puffs every 4-6 hours only as needed for shortness  of breath or wheezing. -     methylPREDNISolone acetate (DEPO-MEDROL) injection 80 mg; Inject 1 mL (80 mg total) into the muscle once.  Other orders -     cefdinir (OMNICEF) 300 MG capsule; Take 1 capsule (300 mg total) by mouth 2 (two) times daily.   Provided with Depo-Medrol today to help with wheezing and for faster resolution encouraged him to start with albuterol. His wife has albuterol at home so he will try this first before purchasing a inhaler for himself. Start cefdinir for bacterial coverage  Return if symptoms worsen or fail to improve.

## 2014-11-20 NOTE — Patient Instructions (Signed)
I have given you a paper copy of albuterol, before you go purchase this inhaler I recommend you try using your wife's albuterol inhaler and if it helps go ahead and get your own inhaler with this prescription. If on the other hand her inhaler does not help you there is no point in you spending your money on your own inhaler. I also sent and an antibiotic called cefdinir to your pharmacy, start this immediately.  If you're not feeling any better by Friday please call me and I will send in a large dose of prednisone for you.

## 2014-11-22 ENCOUNTER — Telehealth: Payer: Self-pay

## 2014-11-22 NOTE — Telephone Encounter (Signed)
Pt wife asking what signs should she look for if pt pulmonary fibrosis sx gets worse.  She stated that she believes his cough in getting worse but she says its better.  She would like to know this before the weekend if possible.

## 2014-11-22 NOTE — Telephone Encounter (Signed)
Pt advised.

## 2014-11-22 NOTE — Telephone Encounter (Signed)
Signs would be taking more effort to breath, SOB, and persistent non productive cough.  I'm sending in a rx of a larger dose prednisone to his [facility] to start if his cough is no better compared to earlier this week.

## 2014-11-24 ENCOUNTER — Other Ambulatory Visit: Payer: Self-pay | Admitting: Family Medicine

## 2014-11-27 ENCOUNTER — Telehealth: Payer: Self-pay

## 2014-11-27 MED ORDER — PREDNISONE 20 MG PO TABS: ORAL_TABLET | ORAL | Status: AC

## 2014-11-27 NOTE — Telephone Encounter (Signed)
Request approved, Rx sent to [facility], he does not need to take his daily 10mg  prednisone regimen while taking the the taper regimen I sent in today.

## 2014-11-27 NOTE — Telephone Encounter (Signed)
Wife advised. 

## 2014-11-27 NOTE — Telephone Encounter (Signed)
Wife called stating that husband cough isn't any better and was wondering could that high dose of prednisone be called to the pharmacy?

## 2014-12-16 ENCOUNTER — Encounter: Payer: Self-pay | Admitting: Internal Medicine

## 2014-12-16 ENCOUNTER — Ambulatory Visit (INDEPENDENT_AMBULATORY_CARE_PROVIDER_SITE_OTHER): Payer: Medicare Other | Admitting: Internal Medicine

## 2014-12-16 VITALS — BP 114/70 | HR 94

## 2014-12-16 DIAGNOSIS — J8410 Pulmonary fibrosis, unspecified: Secondary | ICD-10-CM | POA: Diagnosis not present

## 2014-12-16 DIAGNOSIS — J84112 Idiopathic pulmonary fibrosis: Secondary | ICD-10-CM | POA: Diagnosis not present

## 2014-12-16 DIAGNOSIS — J9611 Chronic respiratory failure with hypoxia: Secondary | ICD-10-CM | POA: Insufficient documentation

## 2014-12-16 LAB — PULMONARY FUNCTION TEST
DL/VA % pred: 111 %
DL/VA: 5.24 ml/min/mmHg/L
DLCO UNC % PRED: 35 %
DLCO unc: 12.43 ml/min/mmHg
FEF 25-75 PRE: 2.85 L/s
FEF 25-75 Post: 2.47 L/sec
FEF2575-%Change-Post: -13 %
FEF2575-%PRED-PRE: 126 %
FEF2575-%Pred-Post: 109 %
FEV1-%CHANGE-POST: -6 %
FEV1-%PRED-POST: 48 %
FEV1-%PRED-PRE: 51 %
FEV1-POST: 1.54 L
FEV1-PRE: 1.63 L
FEV1FVC-%Change-Post: 1 %
FEV1FVC-%Pred-Pre: 127 %
FEV6-%CHANGE-POST: -7 %
FEV6-%PRED-POST: 39 %
FEV6-%PRED-PRE: 42 %
FEV6-POST: 1.65 L
FEV6-PRE: 1.78 L
FEV6FVC-%PRED-PRE: 106 %
FEV6FVC-%Pred-Post: 106 %
FVC-%CHANGE-POST: -7 %
FVC-%PRED-POST: 37 %
FVC-%Pred-Pre: 40 %
FVC-Post: 1.65 L
FVC-Pre: 1.78 L
POST FEV6/FVC RATIO: 100 %
PRE FEV6/FVC RATIO: 100 %
Post FEV1/FVC ratio: 93 %
Pre FEV1/FVC ratio: 92 %
RV % PRED: 52 %
RV: 1.43 L
TLC % PRED: 40 %
TLC: 3 L

## 2014-12-16 NOTE — Progress Notes (Signed)
PFT done today. 

## 2014-12-16 NOTE — Patient Instructions (Addendum)
ICD-9-CM ICD-10-CM   1. Chronic hypoxemic respiratory failure (HCC) 518.83 J96.11    799.02    2. IPF (idiopathic pulmonary fibrosis) (HCC) 516.31 J84.112    The diagnosis is idiopathic pulmonary fibrosis This is generally a progressive disease Lung function is currently at 40% and is declined by at least 20-28 percent in the last 2 years We discussed therapy with Ofev   - Respect your desire not to start this today but will to think about it  - Read product information about this drug at her convenience Take information booklet about IPF disease We'll refer you to pulmonary fibrosis foundation [name] Use oxygen 4-6 liters Continue prednisone - although I do not think this is a big help  Follow-up - 3 months or sooner if needed

## 2014-12-16 NOTE — Progress Notes (Signed)
Subjective:     Patient ID: [name], male   DOB: 08-19-1934, 80 y.o.   MRN: [id number]  HPI  Ov 10/31/2014  Chief Complaint  Patient presents with  . Follow-up    Former PW pt here for follow up. pt states he is doing well, breathing is good. no c/o SOB, chest tightness, wheezing or cough.  pt on contiuous O2 set at 4LPM. 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 Transfer of care from Dr. Shan Levans to myself Dr. Marchelle Gearing after Dr. Shan Levans has retired from the pulmonary practice. According to the chart and patient and his wife [his wife works at [employer]) . He has a diagnosis of pulmonary fibrosis/Boop since at least 2014 based on CT scan of the chest [I personally visualized this]. I do not find evidence of a surgical lung biopsy. He is been on prednisone since then. According to his wife he was on pirefnidone from summer 2014 to early 2015 through the expanded access program. He developed significant weight loss and had to be hospitalized. After that his life is stabilized. His chronic dyspnea on exertion with mild chronic cough. Relieved by rest. He uses oxygen anywhere between 4 and 6 L. However he says that one day when he checked it on room air is pulse ox was 89% for 12 hours and therefore he questions whether he needs oxygen. In fact his most recent chest x-ray 09/26/2014 shows evidence of some chronic ILD but otherwise clear lung fields. He has been to pulmonary rehabilitation before he has never attended the pulmonary fibrosis support group but is interested in hearing on email from Mr. Army Chaco president of the support group.   Lab work from 2014 May 22 reveals highly positive ANA titer of 1: 1280 but normal rheumatoid factor, IgE, SSA, SSB and scleroderma 70 but he does have some and on symptoms. He also had blood allergy panel at this time which was negative   OV 12/16/2014  Chief Complaint  Patient presents with  . Follow-up    Pt here after  HRCT, PFT and blood work. Pt states his breathing has improved since last OV. Pt denies cough and CP/tightness.    Follow-up interstitial lung disease associated with chronic respiratory hypoxemic failure  He presents with his wife. This visit is to restage his interstitial lung disease. He had CT scan of the chest 11/07/2014 and this shows progression compared to May 2014. The pattern is classic UIP. 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 He had supplemental autoimmune blood test 10/31/2014 and these are negative. Former function test shows FVC 78 L/40%, total lung capacity 3 L/40%, DLCO 12.4/35%. These are percent at 20-28 percent decline in lung function since May 2014. 185 feet 3 laps on 4 L walking desaturation test: Desaturated to 81% using a full head probe  Nevertheless he says he that he feels better. He is working out at [employer]. Privately his wife told me that most third-party observers like friends and family have noticed that he is declining but patient himself feels better. In fact of interstitial lung disease questionnaire symptoms only mild. Patient had significant weight loss with ssbriet in past   K-BILD ILD QUESTIONNAIRE, Symptom score over prior 2 weeks  - shows he is very functional with very little complaints  7-none, 6-rarely, 5-occ, 5-some times, 3-sev times, 2-most times, 1-every time 10/31/2014   Dyspnea for stairs, incline or  hill 7  Chest Tightness 7  Worry about seriousness of lung complaint x  Avoided doing things that make you dyspneic 6  Have you felt loss of control of lung condition (reversed from original) 7  Felt fed up due to lung condition 6  Felt urge to breathe aka air hunger 7  Has lung condition made you feel anxious 7  How often have you experienced wheezing or whistling sound 7  How much of the time have you felt your lung dz is getting worse 7  How much has your lung condition interfered with job or  daily task 7  Were you expecting your lung condition to get worse 7  How much has your lung function limited you carrying things like groceris 7  How much has your lung function made you think of EOL? 7  Total   Are you financially worse off 6  Grand Total     Current outpatient prescriptions:  .  AMBULATORY NON FORMULARY MEDICATION, Rollator rolling walker with seat.  Diagnosis Physical Deconditioning 799.3, Disp: 1 Units, Rfl: 0 .  aspirin 81 MG tablet, Take 81 mg by mouth every morning. , Disp: , Rfl:  .  ferrous sulfate 325 (65 FE) MG EC tablet, Take 1 tablet (325 mg total) by mouth daily with breakfast., Disp: 90 tablet, Rfl: 3 .  finasteride (PROSCAR) 5 MG tablet, Take 1 tablet (5 mg total) by mouth daily., Disp: 90 tablet, Rfl: 1 .  loratadine (CLARITIN) 10 MG tablet, Take 10 mg by mouth every morning. , Disp: , Rfl:  .  midodrine (PROAMATINE) 2.5 MG tablet, TAKE 1 TABLET BY MOUTH TWICE DAILY WITH A MEAL, Disp: 180 tablet, Rfl: 0 .  Multiple Vitamin (MULTIVITAMIN) tablet, Take 1 tablet by mouth daily., Disp: , Rfl:  .  NON FORMULARY, Place 4 L into the nose daily. 3-4 liters and with activity 6 liters, Disp: , Rfl:  .  pantoprazole (PROTONIX) 40 MG tablet, TAKE 1 TABLET BY MOUTH TWICE DAILY BEFORE A MEAL, Disp: 120 tablet, Rfl: 1 .  pravastatin (PRAVACHOL) 40 MG tablet, Take 1 tablet (40 mg total) by mouth at bedtime., Disp: 90 tablet, Rfl: 0 .  predniSONE (DELTASONE) 10 MG tablet, Take 1 tablet (10 mg total) by mouth daily with breakfast., Disp: 90 tablet, Rfl: 2 .  triamcinolone (NASACORT ALLERGY 24HR) 55 MCG/ACT AERO nasal inhaler, Place 2 sprays into the nose daily. (Patient taking differently: Place 2 sprays into the nose daily as needed. ), Disp: 1 Inhaler, Rfl: 12 .  albuterol (PROVENTIL HFA;VENTOLIN HFA) 108 (90 BASE) MCG/ACT inhaler, Inhale two puffs every 4-6 hours only as needed for shortness of breath or wheezing. (Patient not taking: Reported on 12/16/2014), Disp: 1 Inhaler,  Rfl: 1 .  [DISCONTINUED] tadalafil (CIALIS) 20 MG tablet, Take 20 mg by mouth daily as needed. Take one about 1-2 hours before sexual intercourse , Disp: , Rfl:   Allergies  Allergen Reactions  . Pravastatin Sodium Other (See Comments)    Sneezing episodes, can take medication and tolerates reaction.  . Pirfenidone Nausea And Vomiting   Immunization History  Administered Date(s) Administered  . Influenza Split 10/26/2011, 10/21/2014  . Influenza Whole 09/27/2012  . Influenza,inj,Quad PF,36+ Mos 09/28/2013  . Pneumococcal Conjugate-13 09/28/2013  . Pneumococcal Polysaccharide-23 09/25/2009  . Tdap 01/25/2011  . Zoster 10/25/2012     Review of Systems According to history of present illness    Objective:   Physical Exam Filed Vitals:   12/16/14 1627  BP: 114/70  Pulse: 94  SpO2: 55%   Focused exam Alert and oriented 3. Bilateral bibasal crackles present.     Assessment:       ICD-9-CM ICD-10-CM   1. Chronic hypoxemic respiratory failure (HCC) 518.83 J96.11    799.02    2. IPF (idiopathic pulmonary fibrosis) (HCC) 516.31 J84.112    His diagnosis is idiopathic pulmonary fibrosis based on age, male sex, classic UIP pattern on CT chest and progression in the last 2 years. He has had a decline in lung function approximately 25% in 2 years. Nevertheless his symptoms score is good. Given the significant progression of lung function in 2 years he's at high risk for progression and the next one year. Hiatal hernia as a risk factor       Plan:     Face-to-face discussion on several issues  - Discussed progressive nature of his disease with unpredictable features. Nevertheless he is at high risk for progression and the next one year given progression in the last 2 years but his good symptoms score is a relative good prognostic factor. Given a booklet on IPF.  - Discussed anti-fibrotic therapy. He will qualify for Ofev because he is not on anticoagulation and he doesn't have  any bleeding diathesis of GI perforation issues. Nevertheless he is very skeptical of the side effects given his significant intolerance in the presence of hiatal hernia 2  Esbriet. I cautioned him about diarrhea and potential weight loss. He does not want to take ofev but is willing to think about it. We gave the product information.  - This natural course of the disease and continued palliative support which he seems to be okay with as an approach  - Hiatal hernia definitely playing a role  - Follow-up 3 months or sooner if needed  - At follow-up we will discuss about slowly tapering off prednisone which I do not think is beneficial in this situation     > 50% of this > 25 min visit spent in face to face counseling or coordination of care    Dr. Kalman ShanMurali Shann Merrick, M.D., [facility] Pulmonary and Critical Care Medicine Staff Physician [facility] Pager: [phone], If no answer or between  15:00h - 7:00h: call [phone]  12/16/2014 6:17 PM

## 2014-12-26 ENCOUNTER — Ambulatory Visit (INDEPENDENT_AMBULATORY_CARE_PROVIDER_SITE_OTHER): Payer: Medicare Other | Admitting: Family Medicine

## 2014-12-26 ENCOUNTER — Encounter: Payer: Self-pay | Admitting: Family Medicine

## 2014-12-26 VITALS — BP 103/75 | HR 52 | Wt 209.0 lb

## 2014-12-26 DIAGNOSIS — I359 Nonrheumatic aortic valve disorder, unspecified: Secondary | ICD-10-CM

## 2014-12-26 DIAGNOSIS — R0902 Hypoxemia: Secondary | ICD-10-CM | POA: Diagnosis not present

## 2014-12-26 DIAGNOSIS — L819 Disorder of pigmentation, unspecified: Secondary | ICD-10-CM | POA: Diagnosis not present

## 2014-12-26 DIAGNOSIS — I714 Abdominal aortic aneurysm, without rupture, unspecified: Secondary | ICD-10-CM

## 2014-12-26 DIAGNOSIS — J9611 Chronic respiratory failure with hypoxia: Secondary | ICD-10-CM

## 2014-12-26 DIAGNOSIS — M7989 Other specified soft tissue disorders: Secondary | ICD-10-CM

## 2014-12-26 LAB — COMPREHENSIVE METABOLIC PANEL
ALT: 11 U/L (ref 9–46)
AST: 16 U/L (ref 10–35)
Albumin: 3.7 g/dL (ref 3.6–5.1)
Alkaline Phosphatase: 52 U/L (ref 40–115)
BUN: 20 mg/dL (ref 7–25)
CALCIUM: 8.6 mg/dL (ref 8.6–10.3)
CO2: 28 mmol/L (ref 20–31)
Chloride: 100 mmol/L (ref 98–110)
Creat: 1.07 mg/dL (ref 0.70–1.18)
GLUCOSE: 116 mg/dL — AB (ref 65–99)
POTASSIUM: 4 mmol/L (ref 3.5–5.3)
Sodium: 141 mmol/L (ref 135–146)
Total Bilirubin: 0.7 mg/dL (ref 0.2–1.2)
Total Protein: 6.5 g/dL (ref 6.1–8.1)

## 2014-12-26 MED ORDER — PANTOPRAZOLE SODIUM 40 MG PO TBEC: DELAYED_RELEASE_TABLET | ORAL | Status: DC

## 2014-12-26 MED ORDER — FINASTERIDE 5 MG PO TABS: 5.0000 mg | ORAL_TABLET | Freq: Every day | ORAL | Status: DC

## 2014-12-26 MED ORDER — KETOCONAZOLE 2 % EX CREA: 1.0000 "application " | TOPICAL_CREAM | Freq: Two times a day (BID) | CUTANEOUS | Status: DC

## 2014-12-26 NOTE — Assessment & Plan Note (Addendum)
Subacute. Likely due to venous insufficiency given dependent, blanchable erythema after 2 weeks being sedentary and ability to remove erythema with LE muscle use. For this we will get venous doppler study an recommend compression stockings daily. Given his history of valve replacement and AAA, distal emboli is on the differential. Given his overall complicated medical history including pulmonary fibrosis, DIC must be considered as well. For this we will get echo, LE arterial and venus doppler study, U/S aorta, and labs to evaluate these potential causes. Fungal infection of toes is likely, possible that this causing a superficial fungal infection, though unlikely. Will prescribe topical antifungal for this.   F/u next week.

## 2014-12-26 NOTE — Patient Instructions (Signed)
Thank you for coming in today. You were seen for discoloration of your feet. This is most likely due to your veins being too weak to return blood and fluid to your heart. We would like to get ultrasound of your veins to evaluate this. We recommend compression stockings as well.  Given your medical history of heart valve replacement and aortic aneurysm, we would like to also get ultrasound of your aorta, leg arteries and an echocardiogram to look at your heart valve as both could have clotting that has spread to your foot and blocked blood flow to the foot. We will also get labs to look for problems with your heart or clotting.  We will prescribe anti-fungal cream for your toes in case this is an infection that is spreading, though this is unlikely the case.  Follow up in 1 week

## 2014-12-26 NOTE — Progress Notes (Signed)
[name] is a 80 y.o. male who [facility]  today for bilateral foot discoloration.  Patient first noted erythema in bilateral medial feet and distal toes 4 weeks ago. This progressively worsened until today, with edema noted 1 week ago. Patient notes he has soaked his feet in vinegar and water for his toes, and this has not improved the problem. Patient notes no pain, trauma, lesions, pruritis, other skin rashes. He denies chest pain, palpitations.   Of note, ne normally walks 30 minutes daily, but has not done so in 2 weeks. He notes he is able to make the erythema go away with dorsi/plantarflexing his foot several times.   Past Medical History  Diagnosis Date  . Hypertension   . Hyperlipidemia   . Aortic stenosis     AVR 2011  . Dermatitis     poison ivy  . Cleft palate   . Interstitial lung disease (HCC)   . Pulmonary fibrosis (HCC)   . Syncope and collapse 07/18/2014  . GERD (gastroesophageal reflux disease)   . AAA (abdominal aortic aneurysm) [facility](HCC)    Past Surgical History  Procedure Laterality Date  . Bilat cataract sx  2004  . Cholecystectomy  1999  . 7 surgeries for cleft palate  1938  . Oral surgery with bone graft from hip for cleft palate  1989  . Right groin hernia repair  1950's  . Rotator cuff tear, left shoulder    . Aortic valve replacement w/ pericardial tissue valve  10-15-09    Dr Barry Dieneswens  . Eye surgery     Social History  Substance Use Topics  . Smoking status: Former Smoker -- 1.50 packs/day for 5 years    Types: Cigarettes, Cigars    Quit date: 09/08/1977  . Smokeless tobacco: Never Used  . Alcohol Use: No   family history includes Cancer in his father and sister; Diabetes in his sister; Heart attack in his brother, father, and mother; Heart murmur in his brother; Hyperlipidemia in his brother; Hypertension in his brother.  ROS as above Medications: Current Outpatient Prescriptions  Medication  Sig Dispense Refill  . albuterol (PROVENTIL HFA;VENTOLIN HFA) 108 (90 BASE) MCG/ACT inhaler Inhale two puffs every 4-6 hours only as needed for shortness of breath or wheezing. 1 Inhaler 1  . AMBULATORY NON FORMULARY MEDICATION Rollator rolling walker with seat.  Diagnosis Physical Deconditioning 799.3 1 Units 0  . aspirin 81 MG tablet Take 81 mg by mouth every morning.     . ferrous sulfate 325 (65 FE) MG EC tablet Take 1 tablet (325 mg total) by mouth daily with breakfast. 90 tablet 3  . finasteride (PROSCAR) 5 MG tablet Take 1 tablet (5 mg total) by mouth daily. 90 tablet 1  . loratadine (CLARITIN) 10 MG tablet Take 10 mg by mouth every morning.     . midodrine (PROAMATINE) 2.5 MG tablet TAKE 1 TABLET BY MOUTH TWICE DAILY WITH A MEAL 180 tablet 0  . Multiple Vitamin (MULTIVITAMIN) tablet Take 1 tablet by mouth daily.    . NON FORMULARY Place 4 L into the nose daily. 3-4 liters and with activity 6 liters    . pantoprazole (PROTONIX) 40 MG tablet TAKE 1 TABLET BY MOUTH TWICE DAILY BEFORE A MEAL 120 tablet 1  . pravastatin (PRAVACHOL) 40 MG tablet Take 1 tablet (40 mg total) by mouth at bedtime. 90 tablet 0  . predniSONE (DELTASONE) 10 MG tablet Take 1 tablet (10 mg  total) by mouth daily with breakfast. 90 tablet 2  . triamcinolone (NASACORT ALLERGY 24HR) 55 MCG/ACT AERO nasal inhaler Place 2 sprays into the nose daily. (Patient taking differently: Place 2 sprays into the nose daily as needed. ) 1 Inhaler 12  . ketoconazole (NIZORAL) 2 % cream Apply 1 application topically 2 (two) times daily. To affected areas. 60 g 3  . [DISCONTINUED] tadalafil (CIALIS) 20 MG tablet Take 20 mg by mouth daily as needed. Take one about 1-2 hours before sexual intercourse      No current facility-administered medications for this visit.   Allergies  Allergen Reactions  . Pravastatin Sodium Other (See Comments)    Sneezing episodes, can take medication and tolerates reaction.  . Pirfenidone Nausea And Vomiting      Exam:  BP 103/75 mmHg  Pulse 52  Wt 209 lb (94.802 kg)  SpO2 100% Gen: Chronically ill appearing gentleman in NAD seated in chair.  Nontoxic appearing.  HEENT: EOMI,  MMM Lungs: Normal work of breathing. CTABL, no wheezes or crackle.s  Heart: RRR no MRG Abd: NABS, Soft. Nondistended, Nontender. No bruits auscultated.  Exts: Brisk capillary refill, warm and well perfused. Bilateral feet with medial foot and distal toe blanching erythema more prominent in plantar half of foot with medial white scaling of skin. Tibial pulses barely palpable. Dorsalis pedis 2+ bilaterally. Yellow, brittle, rough toes x 10 with 1st toenails cut in half bilaterally. 2+ pitting edema to ankle. No palpable purpura present.  No results found for this or any previous visit (from the past 24 hour(s)). No results found.   Please see individual assessment and plan sections.

## 2014-12-27 LAB — CBC WITH DIFFERENTIAL/PLATELET
BASOS ABS: 0 10*3/uL (ref 0.0–0.1)
Basophils Relative: 0 % (ref 0–1)
EOS ABS: 0 10*3/uL (ref 0.0–0.7)
EOS PCT: 0 % (ref 0–5)
HEMATOCRIT: 33.3 % — AB (ref 39.0–52.0)
Hemoglobin: 10.9 g/dL — ABNORMAL LOW (ref 13.0–17.0)
LYMPHS PCT: 5 % — AB (ref 12–46)
Lymphs Abs: 0.9 10*3/uL (ref 0.7–4.0)
MCH: 26.5 pg (ref 26.0–34.0)
MCHC: 32.7 g/dL (ref 30.0–36.0)
MCV: 80.8 fL (ref 78.0–100.0)
MONO ABS: 1 10*3/uL (ref 0.1–1.0)
Monocytes Relative: 6 % (ref 3–12)
Neutro Abs: 15.2 10*3/uL — ABNORMAL HIGH (ref 1.7–7.7)
Neutrophils Relative %: 89 % — ABNORMAL HIGH (ref 43–77)
PLATELETS: 116 10*3/uL — AB (ref 150–400)
RBC: 4.12 MIL/uL — AB (ref 4.22–5.81)
RDW: 17.5 % — AB (ref 11.5–15.5)
WBC: 17.1 10*3/uL — AB (ref 4.0–10.5)

## 2014-12-27 LAB — FIBRINOGEN: FIBRINOGEN: 297 mg/dL (ref 204–475)

## 2014-12-27 LAB — PROTIME-INR
INR: 1.01 (ref <1.50)
Prothrombin Time: 13.4 seconds (ref 11.6–15.2)

## 2014-12-27 LAB — APTT: APTT: 31 s (ref 24–37)

## 2014-12-27 LAB — BRAIN NATRIURETIC PEPTIDE: BRAIN NATRIURETIC PEPTIDE: 115.6 pg/mL — AB (ref 0.0–100.0)

## 2014-12-27 NOTE — Progress Notes (Signed)
Quick Note:  Labs are still coming back but look unchanged from prior labs. Awaiting further results. ______

## 2015-01-01 ENCOUNTER — Telehealth: Payer: Self-pay | Admitting: Family Medicine

## 2015-01-01 ENCOUNTER — Other Ambulatory Visit: Payer: Self-pay | Admitting: Family Medicine

## 2015-01-01 DIAGNOSIS — I359 Nonrheumatic aortic valve disorder, unspecified: Secondary | ICD-10-CM

## 2015-01-01 DIAGNOSIS — I714 Abdominal aortic aneurysm, without rupture, unspecified: Secondary | ICD-10-CM

## 2015-01-01 DIAGNOSIS — L819 Disorder of pigmentation, unspecified: Secondary | ICD-10-CM

## 2015-01-01 DIAGNOSIS — J9611 Chronic respiratory failure with hypoxia: Secondary | ICD-10-CM

## 2015-01-01 NOTE — Telephone Encounter (Signed)
Patient wife advised of the following appts, sent as MyChart message as well to ensure accuracy.   [facility]:  Address: [address], [place], [place] [address] Phone: [phone]) [phone]  [facility]: Address: [address], [place], [place] [address] Phone: [phone]  Appointments:  **01/02/15 @ 3:00pm: [facility]: Venous [facility]**  **01/03/15 @ 2:00pm: [place]: Arterial Ultrasound** **01/03/15 @ 2:30pm: [place]: ABI scan**  **01/13/15 @ 8:00am: [facility]: AAA Duplex**

## 2015-01-02 ENCOUNTER — Encounter: Payer: Self-pay | Admitting: Family Medicine

## 2015-01-02 ENCOUNTER — Ambulatory Visit (INDEPENDENT_AMBULATORY_CARE_PROVIDER_SITE_OTHER): Payer: Medicare Other | Admitting: Family Medicine

## 2015-01-02 ENCOUNTER — Other Ambulatory Visit: Payer: Self-pay | Admitting: Family Medicine

## 2015-01-02 ENCOUNTER — Ambulatory Visit (HOSPITAL_COMMUNITY)
Admission: RE | Admit: 2015-01-02 | Discharge: 2015-01-02 | Disposition: A | Discharge status: Home / Self Care | Payer: Medicare Other | Source: Ambulatory Visit | Attending: Family Medicine | Admitting: Family Medicine

## 2015-01-02 VITALS — BP 109/59 | HR 99 | Wt 211.0 lb

## 2015-01-02 DIAGNOSIS — I872 Venous insufficiency (chronic) (peripheral): Secondary | ICD-10-CM | POA: Diagnosis not present

## 2015-01-02 DIAGNOSIS — I714 Abdominal aortic aneurysm, without rupture, unspecified: Secondary | ICD-10-CM

## 2015-01-02 DIAGNOSIS — I70203 Unspecified atherosclerosis of native arteries of extremities, bilateral legs: Secondary | ICD-10-CM | POA: Diagnosis not present

## 2015-01-02 DIAGNOSIS — M79604 Pain in right leg: Secondary | ICD-10-CM | POA: Diagnosis not present

## 2015-01-02 DIAGNOSIS — L819 Disorder of pigmentation, unspecified: Secondary | ICD-10-CM

## 2015-01-02 DIAGNOSIS — M79605 Pain in left leg: Secondary | ICD-10-CM | POA: Insufficient documentation

## 2015-01-02 DIAGNOSIS — J9611 Chronic respiratory failure with hypoxia: Secondary | ICD-10-CM | POA: Diagnosis not present

## 2015-01-02 DIAGNOSIS — I359 Nonrheumatic aortic valve disorder, unspecified: Secondary | ICD-10-CM | POA: Diagnosis not present

## 2015-01-02 DIAGNOSIS — M7989 Other specified soft tissue disorders: Secondary | ICD-10-CM | POA: Insufficient documentation

## 2015-01-02 DIAGNOSIS — I739 Peripheral vascular disease, unspecified: Secondary | ICD-10-CM | POA: Insufficient documentation

## 2015-01-02 DIAGNOSIS — M79606 Pain in leg, unspecified: Secondary | ICD-10-CM | POA: Diagnosis present

## 2015-01-02 NOTE — Progress Notes (Signed)
VASCULAR LAB PRELIMINARY  PRELIMINARY  PRELIMINARY  PRELIMINARY  Bilateral lower extremity venous duplex completed.    Preliminary report:  Bilateral:  No evidence of DVT, superficial thrombosis, or Baker's Cyst.   Nataya Bastedo, RVS 01/02/2015, 4:33 PM

## 2015-01-02 NOTE — Progress Notes (Signed)
CC: [name] is a 80 y.o. male is here for Follow-up   Subjective: HPI:  Follow-up erythema and edema of both lower extremities. He's been using compression stockings during all waking hours. He's noticed a drastic improvement with the above complaints. If he elevates his feet his skin tone of the feet turns back to normal. If he leaves his feet below his waist for more than a minute to get redness in the toes but nowhere else. He denies any limb claudication or cramping. He denies any pain or itching. He denies any new shortness of breath and actually got back into his habit of 30 minutes on a treadmill every day at [employer]. He denies any irregular heartbeat or chest pain. Denies any fevers or chills   Review Of Systems Outlined In HPI  Past Medical History  Diagnosis Date  . Hypertension   . Hyperlipidemia   . Aortic stenosis     AVR 2011  . Dermatitis     poison ivy  . Cleft palate   . Interstitial lung disease (HCC)   . Pulmonary fibrosis (HCC)   . Syncope and collapse 07/18/2014  . GERD (gastroesophageal reflux disease)   . AAA (abdominal aortic aneurysm) [facility](HCC)     Past Surgical History  Procedure Laterality Date  . Bilat cataract sx  2004  . Cholecystectomy  1999  . 7 surgeries for cleft palate  1938  . Oral surgery with bone graft from hip for cleft palate  1989  . Right groin hernia repair  1950's  . Rotator cuff tear, left shoulder    . Aortic valve replacement w/ pericardial tissue valve  10-15-09    Dr Barry Dieneswens  . Eye surgery     Family History  Problem Relation Age of Onset  . Heart attack Mother   . Cancer Father     lung and stomach  . Heart attack Father   . Cancer Sister     bone  . Diabetes Sister   . Heart murmur Brother   . Hypertension Brother   . Hyperlipidemia Brother   . Heart attack Brother     Social History   Social History  . Marital Status: Married    Spouse Name: N/A  . Number of Children: N/A  . Years of Education: N/A    Occupational History  . Retired    Social History Main Topics  . Smoking status: Former Smoker -- 1.50 packs/day for 5 years    Types: Cigarettes, Cigars    Quit date: 09/08/1977  . Smokeless tobacco: Never Used  . Alcohol Use: No  . Drug Use: No  . Sexual Activity: Not on file   Other Topics Concern  . Not on file   Social History Narrative     Objective: BP 109/59 mmHg  Pulse 99  Wt 211 lb (95.709 kg)  General: Alert and Oriented, No Acute Distress HEENT: Pupils equal, round, reactive to light. Conjunctivae clear.  Moist mucous membranes Lungs: Comfortable work of breathing Cardiac: Regular rate and rhythm. Normal S1/S2.  No murmurs, rubs, nor gallops.   Abdomen: Normal bowel sounds, soft and non tender without palpable masses. Extremities: No peripheral edema.  Strong peripheral pulses.  Mental Status: No depression, anxiety, nor agitation. Skin: Warm and dry. Mild erythema of the toes when feet are below the waist more than 1 minute, this disappears if he elevates his feet above the waist for more than 1 minute.  Assessment & Plan: [name] was  seen today for follow-up.  Diagnoses and all orders for this visit:  Venous (peripheral) insufficiency   I really think is discoloration and swelling is due to venous insufficiency however I think it would be appropriate to go ahead and go through with the ultrasound and ABI tests that Dr. Denyse Amass ordered last week just to make sure that there is no arterial insufficiency that contributed to this. For now I've encouraged him to continue to wear compression stockings during all waking hours.  25 minutes spent face-to-face during visit today of which at least 50% was counseling or coordinating care regarding: 1. Venous (peripheral) insufficiency       Return if symptoms worsen or fail to improve.

## 2015-01-03 ENCOUNTER — Ambulatory Visit (HOSPITAL_BASED_OUTPATIENT_CLINIC_OR_DEPARTMENT_OTHER)
Admission: RE | Admit: 2015-01-03 | Discharge: 2015-01-03 | Disposition: A | Discharge status: Home / Self Care | Payer: Medicare Other | Source: Ambulatory Visit | Attending: Family Medicine | Admitting: Family Medicine

## 2015-01-03 DIAGNOSIS — L819 Disorder of pigmentation, unspecified: Secondary | ICD-10-CM

## 2015-01-03 DIAGNOSIS — J9611 Chronic respiratory failure with hypoxia: Secondary | ICD-10-CM

## 2015-01-03 DIAGNOSIS — I359 Nonrheumatic aortic valve disorder, unspecified: Secondary | ICD-10-CM | POA: Diagnosis not present

## 2015-01-03 DIAGNOSIS — I714 Abdominal aortic aneurysm, without rupture, unspecified: Secondary | ICD-10-CM

## 2015-01-03 DIAGNOSIS — M79605 Pain in left leg: Secondary | ICD-10-CM | POA: Diagnosis not present

## 2015-01-03 NOTE — Progress Notes (Signed)
VASCULAR LAB PRELIMINARY  ARTERIAL  ABI completed:    RIGHT    LEFT    PRESSURE WAVEFORM  PRESSURE WAVEFORM  BRACHIAL 143 Triphasic BRACHIAL 133 Triphasic  DP 188 Triphasic DP 172 Triphasic  PT 225 Triphasic PT 165 Triphasic    RIGHT LEFT  ABI 1.57 1.20   ABIs and Doppler wave forms are within normal limits bilaterally at rest  Fotini Lemus, RVS 01/03/2015, 4:00 PM

## 2015-01-03 NOTE — Progress Notes (Signed)
VASCULAR LAB PRELIMINARY  ARTERIAL  Bilateral arterial lower extremity duplex completed  No evidence of significant lower extremity arterial stenosis bilaterally at rest.       Monta Police, RVS 01/03/2015, 5:45 PM

## 2015-01-05 ENCOUNTER — Encounter: Payer: Self-pay | Admitting: Family Medicine

## 2015-01-05 DIAGNOSIS — I739 Peripheral vascular disease, unspecified: Secondary | ICD-10-CM | POA: Insufficient documentation

## 2015-01-05 NOTE — Progress Notes (Signed)
Quick Note:  You have some age related changes to the arteries in your legs. This is likely not causing the toe issue. ______

## 2015-01-05 NOTE — Progress Notes (Signed)
Quick Note:    No DVT  ______

## 2015-01-09 ENCOUNTER — Telehealth: Payer: Self-pay

## 2015-01-09 NOTE — Telephone Encounter (Addendum)
Wife reports that her husband left hand is swollen, he feels pain from within but it's not painful to touch and there is no discoloration.  Do you suspect this have something to do with his circulation as well?  Do he need a office visit?

## 2015-01-09 NOTE — Telephone Encounter (Signed)
Wife notified.

## 2015-01-09 NOTE — Telephone Encounter (Signed)
If there is no discoloration that is reassuring that it's probably not a circulation issue. If it becomes permanently white or blue then we need to be worried about a circulation issue.  I would try 600-800mg  of ibuprofen first and if not improving schedule an appointment for tomorrow.

## 2015-01-12 ENCOUNTER — Other Ambulatory Visit: Payer: Self-pay | Admitting: Family Medicine

## 2015-01-13 ENCOUNTER — Inpatient Hospital Stay (HOSPITAL_COMMUNITY): Admission: RE | Admit: 2015-01-13 | Payer: Medicare Other | Source: Ambulatory Visit

## 2015-01-29 ENCOUNTER — Ambulatory Visit (INDEPENDENT_AMBULATORY_CARE_PROVIDER_SITE_OTHER): Payer: Medicare Other | Admitting: Sports Medicine

## 2015-01-29 ENCOUNTER — Encounter: Payer: Self-pay | Admitting: Sports Medicine

## 2015-01-29 ENCOUNTER — Ambulatory Visit (INDEPENDENT_AMBULATORY_CARE_PROVIDER_SITE_OTHER): Payer: Medicare Other

## 2015-01-29 VITALS — BP 117/71 | HR 114 | Temp 97.8°F | Resp 18 | Wt 209.0 lb

## 2015-01-29 DIAGNOSIS — W19XXXA Unspecified fall, initial encounter: Secondary | ICD-10-CM | POA: Diagnosis not present

## 2015-01-29 DIAGNOSIS — S4992XA Unspecified injury of left shoulder and upper arm, initial encounter: Secondary | ICD-10-CM | POA: Diagnosis not present

## 2015-01-29 DIAGNOSIS — S42292A Other displaced fracture of upper end of left humerus, initial encounter for closed fracture: Secondary | ICD-10-CM | POA: Diagnosis not present

## 2015-01-29 DIAGNOSIS — S42202A Unspecified fracture of upper end of left humerus, initial encounter for closed fracture: Secondary | ICD-10-CM

## 2015-01-29 MED ORDER — HYDROCODONE-ACETAMINOPHEN 5-325 MG PO TABS: 0.5000 | ORAL_TABLET | Freq: Three times a day (TID) | ORAL | Status: DC | PRN

## 2015-01-29 NOTE — Assessment & Plan Note (Signed)
There is a transverse fracture through the surgical neck of the proximal humerus, impacted without any evidence of intra-articular extension. Patient has been on prednisone which likely weak and his bones, we will change this for now. Sling for 4-6 weeks likely followed by physical therapy for at least another month. Low-dose hydrocodone for pain. Return to see me in 2 weeks, x-ray before visit.  I billed a fracture code for this encounter, all subsequent visits will be post-op checks in the global period.

## 2015-01-29 NOTE — Progress Notes (Signed)
  Subjective:    CC: left shoulder injury  HPI: This is a pleasant 81 year old male, recently he fell onto his left shoulder after tripping over a flip-flop. He is on prednisone 10 mg daily from chronic interstitial lung disease. He had immediate pain, swelling, no bruising. No numbness or tingling. Pain is at the proximal humerus and is worse with any motion.  Past medical history, Surgical history, Family history not pertinant except as noted below, Social history, Allergies, and medications have been entered into the medical record, reviewed, and no changes needed.   Review of Systems: No fevers, chills, night sweats, weight loss, chest pain, or shortness of breath.   Objective:    General: Well Developed, well nourished, and in no acute distress.  Neuro: Alert and oriented x3, extra-ocular muscles intact, sensation grossly intact.  HEENT: Normocephalic, atraumatic, pupils equal round reactive to light, neck supple, no masses, no lymphadenopathy, thyroid nonpalpable.  Skin: Warm and dry, no rashes. Cardiac: Regular rate and rhythm, no murmurs rubs or gallops, no lower extremity edema.  Respiratory: Clear to auscultation bilaterally. Not using accessory muscles, speaking in full sentences. Left shoulder: Tender to palpation over the neck of the humerus, motion is severely limited by pain, no bruising there is significant swelling with palpable effusion.  X-rays ordered and reviewed by me, there is a transverse fracture through the surgical neck of the proximal humerus without angulation or displacement, it does appear mildly impacted and there is no evidence of intra-articular extension.  Impression and Recommendations:

## 2015-02-04 ENCOUNTER — Ambulatory Visit (INDEPENDENT_AMBULATORY_CARE_PROVIDER_SITE_OTHER): Payer: Medicare Other | Admitting: Family Medicine

## 2015-02-04 ENCOUNTER — Encounter: Payer: Self-pay | Admitting: Family Medicine

## 2015-02-04 ENCOUNTER — Other Ambulatory Visit: Payer: Self-pay | Admitting: Family Medicine

## 2015-02-04 VITALS — BP 155/82 | HR 117 | Wt 228.0 lb

## 2015-02-04 DIAGNOSIS — R609 Edema, unspecified: Secondary | ICD-10-CM

## 2015-02-04 DIAGNOSIS — E8770 Fluid overload, unspecified: Secondary | ICD-10-CM | POA: Diagnosis not present

## 2015-02-04 DIAGNOSIS — I509 Heart failure, unspecified: Secondary | ICD-10-CM

## 2015-02-04 MED ORDER — FUROSEMIDE 40 MG PO TABS: ORAL_TABLET | ORAL | Status: DC

## 2015-02-04 MED ORDER — FUROSEMIDE 10 MG/ML IJ SOLN: 40.0000 mg | Freq: Once | INTRAMUSCULAR | Status: DC

## 2015-02-04 MED ORDER — FUROSEMIDE 10 MG/ML IJ SOLN
40.0000 mg | Freq: Once | INTRAMUSCULAR | Status: AC
  Administered 2015-02-04: 40 mg via INTRAMUSCULAR

## 2015-02-04 NOTE — Addendum Note (Signed)
Addended by: Thom ChimesHENRY, Marvis Bakken M on: 02/04/2015 11:37 AM   Modules accepted: Orders

## 2015-02-04 NOTE — Progress Notes (Signed)
CC: [name] is a 81 y.o. male is here for Leg Swelling; Back Pain; and Shortness of Breath   Subjective: HPI:  Over the past 2 days patient has noticed increased fluid retention in the lower extremities. He also feels like his abdomen is much more bloated when he is used to. He has also had some shortness of breath worsening from his baseline in conjunction with the above symptoms. He notices that his swelling will improve if he elevates his legs. He denies any chest pain, irregular heartbeat, racing heartbeat or skin changes at the site of  Swelling. He has been adding salt to most of his foods due to my recommendation months ago for treatment of his hypotension. He denies fevers, chills, wheezing, cough.   Review Of Systems Outlined In HPI  Past Medical History  Diagnosis Date  . Hypertension   . Hyperlipidemia   . Aortic stenosis     AVR 2011  . Dermatitis     poison ivy  . Cleft palate   . Interstitial lung disease (HCC)   . Pulmonary fibrosis (HCC)   . Syncope and collapse 07/18/2014  . GERD (gastroesophageal reflux disease)   . AAA (abdominal aortic aneurysm) [facility])     Past Surgical History  Procedure Laterality Date  . Bilat cataract sx  2004  . Cholecystectomy  1999  . 7 surgeries for cleft palate  1938  . Oral surgery with bone graft from hip for cleft palate  1989  . Right groin hernia repair  1950's  . Rotator cuff tear, left shoulder    . Aortic valve replacement w/ pericardial tissue valve  10-15-09    Dr Barry Dienes  . Eye surgery     Family History  Problem Relation Age of Onset  . Heart attack Mother   . Cancer Father     lung and stomach  . Heart attack Father   . Cancer Sister     bone  . Diabetes Sister   . Heart murmur Brother   . Hypertension Brother   . Hyperlipidemia Brother   . Heart attack Brother     Social History   Social History  . Marital Status: Married    Spouse Name: N/A  . Number of Children: N/A  . Years of Education: N/A    Occupational History  . Retired    Social History Main Topics  . Smoking status: Former Smoker -- 1.50 packs/day for 5 years    Types: Cigarettes, Cigars    Quit date: 09/08/1977  . Smokeless tobacco: Never Used  . Alcohol Use: No  . Drug Use: No  . Sexual Activity: Not on file   Other Topics Concern  . Not on file   Social History Narrative     Objective: BP 155/82 mmHg  Pulse 117  Wt 228 lb (103.42 kg)  SpO2 96%  General: Alert and Oriented, No Acute Distress HEENT: Pupils equal, round, reactive to light. Conjunctivae clear.  Moist mucous membranes pharynx unremarkable Lungs: Clear to auscultation bilaterally, no wheezing/ronchi/rales.  Comfortable work of breathing. Good air movement. Cardiac: crackles in the lower lung fields, no rhonchi or wheezing. Comfortable work of breathing Abdomen: moderate obesity, soft nontender Extremities: No peripheral edema.  Strong peripheral pulses.  Mental Status: No depression, anxiety, nor agitation. Skin: Warm and dry.  Assessment & Plan: Syon was seen today for leg swelling, back pain and shortness of breath.  Diagnoses and all orders for this visit:  Fluid retention -  furosemide (LASIX) 40 MG tablet; Full tablet in the morning and half tablet at lunch time.  Acute congestive heart failure, unspecified congestive heart failure type (HCC)   Mild CHF due to fluid overload he was given 40 mg of Lasix here in the office and instructed to start on Lasix regimen starting tomorrow. Avoid adding salt to food until I see him next on either Thursday or Friday which ever one is convenient for him. Signs and symptoms requring emergent/urgent reevaluation were discussed with the patient. Holding midrodrine while BP is above 120/70.  25 minutes spent face-to-face during visit today of which at least 50% was counseling or coordinating care regarding: 1. Fluid retention   2. Acute congestive heart failure, unspecified congestive heart  failure type [facility](HCC)      Return for Recheck on Thursday or Friday This Week..Marland Kitchen

## 2015-02-07 ENCOUNTER — Ambulatory Visit (INDEPENDENT_AMBULATORY_CARE_PROVIDER_SITE_OTHER): Payer: Medicare Other | Admitting: Family Medicine

## 2015-02-07 ENCOUNTER — Encounter: Payer: Self-pay | Admitting: Family Medicine

## 2015-02-07 ENCOUNTER — Other Ambulatory Visit: Payer: Self-pay | Admitting: Family Medicine

## 2015-02-07 VITALS — BP 113/75 | HR 112 | Wt 218.0 lb

## 2015-02-07 DIAGNOSIS — I509 Heart failure, unspecified: Secondary | ICD-10-CM

## 2015-02-07 MED ORDER — FUROSEMIDE 40 MG PO TABS: 40.0000 mg | ORAL_TABLET | Freq: Two times a day (BID) | ORAL | Status: DC

## 2015-02-07 NOTE — Progress Notes (Signed)
CC: [name] is a 81 y.o. male is here for Edema and Back Pain   Subjective: HPI:  Follow-up edema: edema has slightly improved since starting on 40 mg of Lasix every morning and 20 in the afternoon. He notices that he is urinating more than he is used to but denies dysuria or any other urinary complaints. He feels like the swelling in his legs and abdomen have improved to a moderate degree but still persist to some degree. Nothing seems to make symptoms better or worse. He has been able to cut back on oxygen to 4 L/m instead of the 6-7 that he was using earlier this week. He denies cough or wheezing. He denies exertional chest pain or chest pain at rest. He denies asymmetric swelling of the lower legs.   Review Of Systems Outlined In HPI  Past Medical History  Diagnosis Date  . Hypertension   . Hyperlipidemia   . Aortic stenosis     AVR 2011  . Dermatitis     poison ivy  . Cleft palate   . Interstitial lung disease (HCC)   . Pulmonary fibrosis (HCC)   . Syncope and collapse 07/18/2014  . GERD (gastroesophageal reflux disease)   . [facility]     Past Surgical History  Procedure Laterality Date  . Bilat cataract sx  2004  . Cholecystectomy  1999  . 7 surgeries for cleft palate  1938  . Oral surgery with bone graft from hip for cleft palate  1989  . Right groin hernia repair  1950's  . Rotator cuff tear, left shoulder    . Aortic valve replacement w/ pericardial tissue valve  10-15-09    Dr Barry Dienes  . Eye surgery     Family History  Problem Relation Age of Onset  . Heart attack Mother   . Cancer Father     lung and stomach  . Heart attack Father   . Cancer Sister     bone  . Diabetes Sister   . Heart murmur Brother   . Hypertension Brother   . Hyperlipidemia Brother   . Heart attack Brother     Social History   Social History  . Marital Status: Married    Spouse Name: N/A  . Number of Children: N/A  . Years of Education: N/A    Occupational History  . Retired    Social History Main Topics  . Smoking status: Former Smoker -- 1.50 packs/day for 5 years    Types: Cigarettes, Cigars    Quit date: 09/08/1977  . Smokeless tobacco: Never Used  . Alcohol Use: No  . Drug Use: No  . Sexual Activity: Not on file   Other Topics Concern  . Not on file   Social History Narrative     Objective: BP 113/75 mmHg  Pulse 112  Wt 218 lb (98.884 kg)  SpO2 91%  General: Alert and Oriented, No Acute Distress HEENT: Pupils equal, round, reactive to light. Conjunctivae clear.  Moist mucous membranes Lungs: Clear to auscultation bilaterally, no wheezing/ronchi/rales.  Comfortable work of breathing. Good air movement. Cardiac: similar to exam earlier this week with crackles in the lower lung fields without rhonchi or rales Abdomen: obese and soft Extremities: trace to 1+ pitting edema from the shins distally.  Strong peripheral pulses.  Mental Status: No depression, anxiety, nor agitation. Skin: Warm and dry.  Assessment & Plan: [name] was seen today for edema and back pain.  Diagnoses and  all orders for this visit:  Acute heart failure, unspecified heart failure type (HCC) -     Echocardiogram -     furosemide (LASIX) 40 MG tablet; Take 1 tablet (40 mg total) by mouth 2 (two) times daily.   Still exuding signs of CHF however improving, I like him to increase his Lasix to 40 mg twice a day along with obtaining an ultrasound of the heart over the weekend to look for structural abnormalities that could be contributing to this   Return if symptoms worsen or fail to improve.

## 2015-02-10 ENCOUNTER — Telehealth: Payer: Self-pay | Admitting: Family Medicine

## 2015-02-10 NOTE — Telephone Encounter (Signed)
This will be addressed today.

## 2015-02-10 NOTE — Telephone Encounter (Signed)
[name], I received a fax response from [facility] over the weekend stating that they can not perform Echocardiograms at their outpatient clinics.  Can you please schedule an Echocardiogram at the [facility]?

## 2015-02-12 ENCOUNTER — Ambulatory Visit (INDEPENDENT_AMBULATORY_CARE_PROVIDER_SITE_OTHER): Payer: Medicare Other

## 2015-02-12 ENCOUNTER — Ambulatory Visit (INDEPENDENT_AMBULATORY_CARE_PROVIDER_SITE_OTHER): Payer: Medicare Other | Admitting: Sports Medicine

## 2015-02-12 ENCOUNTER — Ambulatory Visit (HOSPITAL_BASED_OUTPATIENT_CLINIC_OR_DEPARTMENT_OTHER)
Admission: RE | Admit: 2015-02-12 | Discharge: 2015-02-12 | Disposition: A | Discharge status: Home / Self Care | Payer: Medicare Other | Source: Ambulatory Visit | Attending: Family Medicine | Admitting: Family Medicine

## 2015-02-12 VITALS — BP 84/60 | HR 115 | Resp 18 | Wt 207.7 lb

## 2015-02-12 DIAGNOSIS — I509 Heart failure, unspecified: Secondary | ICD-10-CM

## 2015-02-12 DIAGNOSIS — S42292A Other displaced fracture of upper end of left humerus, initial encounter for closed fracture: Secondary | ICD-10-CM | POA: Diagnosis not present

## 2015-02-12 DIAGNOSIS — E785 Hyperlipidemia, unspecified: Secondary | ICD-10-CM | POA: Diagnosis not present

## 2015-02-12 DIAGNOSIS — Z954 Presence of other heart-valve replacement: Secondary | ICD-10-CM | POA: Insufficient documentation

## 2015-02-12 DIAGNOSIS — I10 Essential (primary) hypertension: Secondary | ICD-10-CM | POA: Insufficient documentation

## 2015-02-12 DIAGNOSIS — S42292G Other displaced fracture of upper end of left humerus, subsequent encounter for fracture with delayed healing: Secondary | ICD-10-CM

## 2015-02-12 DIAGNOSIS — S42202D Unspecified fracture of upper end of left humerus, subsequent encounter for fracture with routine healing: Secondary | ICD-10-CM

## 2015-02-12 DIAGNOSIS — S42202A Unspecified fracture of upper end of left humerus, initial encounter for closed fracture: Secondary | ICD-10-CM

## 2015-02-12 DIAGNOSIS — X58XXXD Exposure to other specified factors, subsequent encounter: Secondary | ICD-10-CM

## 2015-02-12 DIAGNOSIS — I059 Rheumatic mitral valve disease, unspecified: Secondary | ICD-10-CM | POA: Diagnosis not present

## 2015-02-12 DIAGNOSIS — S4992XA Unspecified injury of left shoulder and upper arm, initial encounter: Secondary | ICD-10-CM

## 2015-02-12 NOTE — Assessment & Plan Note (Signed)
Fracture is stable, return in 2 weeks, x-ray before visit, continue swelling. He is 2 weeks post fracture and I anticipate 4-6 weeks total and the swelling before starting home health physical therapy to regain range of motion. Stay off prednisone for now.

## 2015-02-12 NOTE — Progress Notes (Signed)
  Echocardiogram 2D Echocardiogram has been performed.  [name] 02/12/2015, 12:00 PM

## 2015-02-12 NOTE — Assessment & Plan Note (Signed)
Likely euvolemic, in fact likely a little bit dry with tachycardia and relative hypotension. His continue furosemide for now, with close follow-up with Dr. Ivan Anchors within 2 weeks.

## 2015-02-12 NOTE — Patient Instructions (Signed)
Stop furosemide for now, keep close follow-up with Dr. Ivan Anchors within 2 weeks, monitor weight daily. We will likely continue the sling for 2-4 more weeks, when I start to see bone formation on the x-ray and we can discontinue sling and start gentle physical therapy, which I will set up at that time.

## 2015-02-12 NOTE — Progress Notes (Signed)
  Subjective:    CC: Follow-up  HPI: Left humeral fracture: Doing well 2 weeks post injury, no pain in the sling without movement.  Fluid retention/CHF: Has been on moderate dose furosemide for the past week, all sensation of bloating and fluid overload has improved however he is minimally hypotensive and tachycardic. No presyncope.  Past medical history, Surgical history, Family history not pertinant except as noted below, Social history, Allergies, and medications have been entered into the medical record, reviewed, and no changes needed.   Review of Systems: No fevers, chills, night sweats, weight loss, chest pain, or shortness of breath.   Objective:    General: Well Developed, well nourished, and in no acute distress.  Neuro: Alert and oriented x3, extra-ocular muscles intact, sensation grossly intact.  HEENT: Normocephalic, atraumatic, pupils equal round reactive to light, neck supple, no masses, no lymphadenopathy, thyroid nonpalpable.  Skin: Warm and dry, no rashes. Cardiac: Regular rate and rhythm, no murmurs rubs or gallops, no lower extremity edema.  Respiratory: Clear to auscultation bilaterally. Not using accessory muscles, speaking in full sentences. Left shoulder: Tender to palpation over the fracture, range of motion is of course limited as expected.  Impression and Recommendations:    I spent 25 minutes with this patient, greater than 50% was face-to-face time counseling regarding the above diagnoses

## 2015-02-13 ENCOUNTER — Encounter: Payer: Self-pay | Admitting: Sports Medicine

## 2015-02-21 ENCOUNTER — Other Ambulatory Visit: Payer: Self-pay | Admitting: Family Medicine

## 2015-02-26 ENCOUNTER — Ambulatory Visit: Payer: Medicare Other | Admitting: Sports Medicine

## 2015-02-26 ENCOUNTER — Ambulatory Visit (INDEPENDENT_AMBULATORY_CARE_PROVIDER_SITE_OTHER): Payer: Medicare Other | Admitting: Family Medicine

## 2015-02-26 ENCOUNTER — Encounter: Payer: Self-pay | Admitting: Sports Medicine

## 2015-02-26 ENCOUNTER — Ambulatory Visit (INDEPENDENT_AMBULATORY_CARE_PROVIDER_SITE_OTHER): Payer: Medicare Other

## 2015-02-26 ENCOUNTER — Encounter: Payer: Self-pay | Admitting: Family Medicine

## 2015-02-26 VITALS — BP 120/70 | HR 90 | Wt 217.0 lb

## 2015-02-26 DIAGNOSIS — S42202D Unspecified fracture of upper end of left humerus, subsequent encounter for fracture with routine healing: Secondary | ICD-10-CM

## 2015-02-26 DIAGNOSIS — E785 Hyperlipidemia, unspecified: Secondary | ICD-10-CM

## 2015-02-26 DIAGNOSIS — E8770 Fluid overload, unspecified: Secondary | ICD-10-CM | POA: Diagnosis not present

## 2015-02-26 DIAGNOSIS — S42292D Other displaced fracture of upper end of left humerus, subsequent encounter for fracture with routine healing: Secondary | ICD-10-CM | POA: Diagnosis not present

## 2015-02-26 DIAGNOSIS — R609 Edema, unspecified: Secondary | ICD-10-CM

## 2015-02-26 DIAGNOSIS — X58XXXD Exposure to other specified factors, subsequent encounter: Secondary | ICD-10-CM

## 2015-02-26 MED ORDER — FUROSEMIDE 20 MG PO TABS: 20.0000 mg | ORAL_TABLET | Freq: Every day | ORAL | Status: DC

## 2015-02-26 MED ORDER — PRAVASTATIN SODIUM 40 MG PO TABS
ORAL_TABLET | ORAL | Status: DC
Start: 2015-02-26 — End: 2016-01-22

## 2015-02-26 NOTE — Progress Notes (Signed)
  Subjective: 4 weeks post left proximal humeral transverse fracture, doing extremely well, nearly pain-free.   Objective: General: Well-developed, well-nourished, and in no acute distress. Left shoulder: No longer tender to palpation over the fracture site, approximately 25 of external rotation, 45 of abduction, 40 of flexion and 40 of extension. Pain present past this.  Assessment/plan:

## 2015-02-26 NOTE — Progress Notes (Signed)
CC: [name] is a 81 y.o. male is here for Edema   Subjective: HPI:  Follow-up edema: For the past 2 weeks he has not been taking furosemide due to tachycardia and hypotension at his most recent sports medicine visit. He's noticed a little bit of weight gain at home. He denies any new shortness of breath or orthopnea. No change in sodium intake. Denies lightheadedness, chest pain, nor rapid heartbeat.  Follow-up hyperlipidemia: He is requesting a refill on pravastatin. He denies any right upper quadrant pain or myalgia. Denies leg claudication or chest pain   Review Of Systems Outlined In HPI  Past Medical History  Diagnosis Date  . Hypertension   . Hyperlipidemia   . Aortic stenosis     AVR 2011  . Dermatitis     poison ivy  . Cleft palate   . Interstitial lung disease (HCC)   . Pulmonary fibrosis (HCC)   . Syncope and collapse 07/18/2014  . GERD (gastroesophageal reflux disease)   . AAA (abdominal aortic aneurysm) [facility])     Past Surgical History  Procedure Laterality Date  . Bilat cataract sx  2004  . Cholecystectomy  1999  . 7 surgeries for cleft palate  1938  . Oral surgery with bone graft from hip for cleft palate  1989  . Right groin hernia repair  1950's  . Rotator cuff tear, left shoulder    . Aortic valve replacement w/ pericardial tissue valve  10-15-09    Dr Barry Dienes  . Eye surgery     Family History  Problem Relation Age of Onset  . Heart attack Mother   . Cancer Father     lung and stomach  . Heart attack Father   . Cancer Sister     bone  . Diabetes Sister   . Heart murmur Brother   . Hypertension Brother   . Hyperlipidemia Brother   . Heart attack Brother     Social History   Social History  . Marital Status: Married    Spouse Name: N/A  . Number of Children: N/A  . Years of Education: N/A   Occupational History  . Retired    Social History Main Topics  . Smoking status: Former Smoker -- 1.50 packs/day for 5 years    Types:  Cigarettes, Cigars    Quit date: 09/08/1977  . Smokeless tobacco: Never Used  . Alcohol Use: No  . Drug Use: No  . Sexual Activity: Not on file   Other Topics Concern  . Not on file   Social History Narrative     Objective: BP 120/70 mmHg  Pulse 90  Wt 217 lb (98.431 kg)  General: Alert and Oriented, No Acute Distress HEENT: Pupils equal, round, reactive to light. Conjunctivae clear.  Moist mucous membranes Lungs: Clear to auscultation bilaterally, no wheezing/ronchi.  Comfortable work of breathing. Good air movement. Trace rales in the lower lung field similar to his baseline exams in the past. Cardiac: Regular rate and rhythm. Normal S1/S2.  No murmurs, rubs, nor gallops.   Abdomen: Mild obesity Extremities: No peripheral edema.  Strong peripheral pulses.  Mental Status: No depression, anxiety, nor agitation. Skin: Warm and dry.  Assessment & Plan: Raylan was seen today for edema.  Diagnoses and all orders for this visit:  Fluid retention  Hyperlipidemia  Other orders -     pravastatin (PRAVACHOL) 40 MG tablet; Take 1 tablet by mouth at  bedtime -     furosemide (LASIX) 20  MG tablet; Take 1 tablet (20 mg total) by mouth daily.   Fluid retention: Worsening chronic condition, restart low-dose of furosemide. Continue to weigh daily and call if weight continues to trend upward. Hyperlipidemia: Controlled with pravastatin.   Return for 1-2 months for BP and Fluid Retention monitoring.

## 2015-02-26 NOTE — Assessment & Plan Note (Signed)
Good bony callus visible on x-rays, good motion without pain. I'm going to set him up for home health physical therapy. Return to see me in 4 weeks.

## 2015-02-28 DIAGNOSIS — I509 Heart failure, unspecified: Secondary | ICD-10-CM | POA: Diagnosis not present

## 2015-02-28 DIAGNOSIS — Z87891 Personal history of nicotine dependence: Secondary | ICD-10-CM | POA: Diagnosis not present

## 2015-02-28 DIAGNOSIS — H9193 Unspecified hearing loss, bilateral: Secondary | ICD-10-CM | POA: Diagnosis not present

## 2015-02-28 DIAGNOSIS — I359 Nonrheumatic aortic valve disorder, unspecified: Secondary | ICD-10-CM | POA: Diagnosis not present

## 2015-02-28 DIAGNOSIS — M6281 Muscle weakness (generalized): Secondary | ICD-10-CM | POA: Diagnosis not present

## 2015-02-28 DIAGNOSIS — I4891 Unspecified atrial fibrillation: Secondary | ICD-10-CM | POA: Diagnosis not present

## 2015-02-28 DIAGNOSIS — Z7982 Long term (current) use of aspirin: Secondary | ICD-10-CM | POA: Diagnosis not present

## 2015-02-28 DIAGNOSIS — J8410 Pulmonary fibrosis, unspecified: Secondary | ICD-10-CM | POA: Diagnosis not present

## 2015-02-28 DIAGNOSIS — Z9981 Dependence on supplemental oxygen: Secondary | ICD-10-CM | POA: Diagnosis not present

## 2015-02-28 DIAGNOSIS — N329 Bladder disorder, unspecified: Secondary | ICD-10-CM | POA: Diagnosis not present

## 2015-02-28 DIAGNOSIS — I951 Orthostatic hypotension: Secondary | ICD-10-CM | POA: Diagnosis not present

## 2015-02-28 DIAGNOSIS — Z9181 History of falling: Secondary | ICD-10-CM | POA: Diagnosis not present

## 2015-02-28 DIAGNOSIS — S42202D Unspecified fracture of upper end of left humerus, subsequent encounter for fracture with routine healing: Secondary | ICD-10-CM | POA: Diagnosis not present

## 2015-03-05 DIAGNOSIS — J8410 Pulmonary fibrosis, unspecified: Secondary | ICD-10-CM | POA: Diagnosis not present

## 2015-03-05 DIAGNOSIS — I509 Heart failure, unspecified: Secondary | ICD-10-CM | POA: Diagnosis not present

## 2015-03-05 DIAGNOSIS — I4891 Unspecified atrial fibrillation: Secondary | ICD-10-CM | POA: Diagnosis not present

## 2015-03-05 DIAGNOSIS — I951 Orthostatic hypotension: Secondary | ICD-10-CM | POA: Diagnosis not present

## 2015-03-05 DIAGNOSIS — M6281 Muscle weakness (generalized): Secondary | ICD-10-CM | POA: Diagnosis not present

## 2015-03-05 DIAGNOSIS — S42202D Unspecified fracture of upper end of left humerus, subsequent encounter for fracture with routine healing: Secondary | ICD-10-CM | POA: Diagnosis not present

## 2015-03-10 DIAGNOSIS — I509 Heart failure, unspecified: Secondary | ICD-10-CM | POA: Diagnosis not present

## 2015-03-10 DIAGNOSIS — M6281 Muscle weakness (generalized): Secondary | ICD-10-CM | POA: Diagnosis not present

## 2015-03-10 DIAGNOSIS — S42202D Unspecified fracture of upper end of left humerus, subsequent encounter for fracture with routine healing: Secondary | ICD-10-CM | POA: Diagnosis not present

## 2015-03-10 DIAGNOSIS — I4891 Unspecified atrial fibrillation: Secondary | ICD-10-CM | POA: Diagnosis not present

## 2015-03-10 DIAGNOSIS — I951 Orthostatic hypotension: Secondary | ICD-10-CM | POA: Diagnosis not present

## 2015-03-10 DIAGNOSIS — J8410 Pulmonary fibrosis, unspecified: Secondary | ICD-10-CM | POA: Diagnosis not present

## 2015-03-11 DIAGNOSIS — I509 Heart failure, unspecified: Secondary | ICD-10-CM | POA: Diagnosis not present

## 2015-03-11 DIAGNOSIS — M6281 Muscle weakness (generalized): Secondary | ICD-10-CM | POA: Diagnosis not present

## 2015-03-11 DIAGNOSIS — I951 Orthostatic hypotension: Secondary | ICD-10-CM | POA: Diagnosis not present

## 2015-03-11 DIAGNOSIS — I4891 Unspecified atrial fibrillation: Secondary | ICD-10-CM | POA: Diagnosis not present

## 2015-03-11 DIAGNOSIS — J8410 Pulmonary fibrosis, unspecified: Secondary | ICD-10-CM | POA: Diagnosis not present

## 2015-03-11 DIAGNOSIS — S42202D Unspecified fracture of upper end of left humerus, subsequent encounter for fracture with routine healing: Secondary | ICD-10-CM | POA: Diagnosis not present

## 2015-03-12 DIAGNOSIS — I4891 Unspecified atrial fibrillation: Secondary | ICD-10-CM | POA: Diagnosis not present

## 2015-03-12 DIAGNOSIS — M6281 Muscle weakness (generalized): Secondary | ICD-10-CM | POA: Diagnosis not present

## 2015-03-12 DIAGNOSIS — J8410 Pulmonary fibrosis, unspecified: Secondary | ICD-10-CM | POA: Diagnosis not present

## 2015-03-12 DIAGNOSIS — S42202D Unspecified fracture of upper end of left humerus, subsequent encounter for fracture with routine healing: Secondary | ICD-10-CM | POA: Diagnosis not present

## 2015-03-12 DIAGNOSIS — I951 Orthostatic hypotension: Secondary | ICD-10-CM | POA: Diagnosis not present

## 2015-03-12 DIAGNOSIS — I509 Heart failure, unspecified: Secondary | ICD-10-CM | POA: Diagnosis not present

## 2015-03-13 DIAGNOSIS — I951 Orthostatic hypotension: Secondary | ICD-10-CM | POA: Diagnosis not present

## 2015-03-13 DIAGNOSIS — S42202D Unspecified fracture of upper end of left humerus, subsequent encounter for fracture with routine healing: Secondary | ICD-10-CM | POA: Diagnosis not present

## 2015-03-13 DIAGNOSIS — I509 Heart failure, unspecified: Secondary | ICD-10-CM | POA: Diagnosis not present

## 2015-03-13 DIAGNOSIS — J8410 Pulmonary fibrosis, unspecified: Secondary | ICD-10-CM | POA: Diagnosis not present

## 2015-03-13 DIAGNOSIS — M6281 Muscle weakness (generalized): Secondary | ICD-10-CM | POA: Diagnosis not present

## 2015-03-13 DIAGNOSIS — I4891 Unspecified atrial fibrillation: Secondary | ICD-10-CM | POA: Diagnosis not present

## 2015-03-14 DIAGNOSIS — M6281 Muscle weakness (generalized): Secondary | ICD-10-CM | POA: Diagnosis not present

## 2015-03-14 DIAGNOSIS — J8410 Pulmonary fibrosis, unspecified: Secondary | ICD-10-CM | POA: Diagnosis not present

## 2015-03-14 DIAGNOSIS — S42202D Unspecified fracture of upper end of left humerus, subsequent encounter for fracture with routine healing: Secondary | ICD-10-CM | POA: Diagnosis not present

## 2015-03-14 DIAGNOSIS — I509 Heart failure, unspecified: Secondary | ICD-10-CM | POA: Diagnosis not present

## 2015-03-14 DIAGNOSIS — I4891 Unspecified atrial fibrillation: Secondary | ICD-10-CM | POA: Diagnosis not present

## 2015-03-14 DIAGNOSIS — I951 Orthostatic hypotension: Secondary | ICD-10-CM | POA: Diagnosis not present

## 2015-03-17 DIAGNOSIS — I951 Orthostatic hypotension: Secondary | ICD-10-CM | POA: Diagnosis not present

## 2015-03-17 DIAGNOSIS — I4891 Unspecified atrial fibrillation: Secondary | ICD-10-CM | POA: Diagnosis not present

## 2015-03-17 DIAGNOSIS — J8410 Pulmonary fibrosis, unspecified: Secondary | ICD-10-CM | POA: Diagnosis not present

## 2015-03-17 DIAGNOSIS — I509 Heart failure, unspecified: Secondary | ICD-10-CM | POA: Diagnosis not present

## 2015-03-17 DIAGNOSIS — S42202D Unspecified fracture of upper end of left humerus, subsequent encounter for fracture with routine healing: Secondary | ICD-10-CM | POA: Diagnosis not present

## 2015-03-17 DIAGNOSIS — M6281 Muscle weakness (generalized): Secondary | ICD-10-CM | POA: Diagnosis not present

## 2015-03-18 DIAGNOSIS — I4891 Unspecified atrial fibrillation: Secondary | ICD-10-CM | POA: Diagnosis not present

## 2015-03-18 DIAGNOSIS — J8410 Pulmonary fibrosis, unspecified: Secondary | ICD-10-CM | POA: Diagnosis not present

## 2015-03-18 DIAGNOSIS — I509 Heart failure, unspecified: Secondary | ICD-10-CM | POA: Diagnosis not present

## 2015-03-18 DIAGNOSIS — S42202D Unspecified fracture of upper end of left humerus, subsequent encounter for fracture with routine healing: Secondary | ICD-10-CM | POA: Diagnosis not present

## 2015-03-18 DIAGNOSIS — M6281 Muscle weakness (generalized): Secondary | ICD-10-CM | POA: Diagnosis not present

## 2015-03-18 DIAGNOSIS — I951 Orthostatic hypotension: Secondary | ICD-10-CM | POA: Diagnosis not present

## 2015-03-19 ENCOUNTER — Ambulatory Visit (INDEPENDENT_AMBULATORY_CARE_PROVIDER_SITE_OTHER): Payer: Medicare Other | Admitting: Internal Medicine

## 2015-03-19 ENCOUNTER — Encounter: Payer: Self-pay | Admitting: Internal Medicine

## 2015-03-19 VITALS — BP 112/70 | HR 103 | Ht 73.0 in | Wt 215.4 lb

## 2015-03-19 DIAGNOSIS — J84112 Idiopathic pulmonary fibrosis: Secondary | ICD-10-CM

## 2015-03-19 DIAGNOSIS — J9611 Chronic respiratory failure with hypoxia: Secondary | ICD-10-CM

## 2015-03-19 NOTE — Progress Notes (Signed)
Subjective:     Patient ID: [name], male   DOB: 1934/12/31, 81 y.o.   MRN: [id number]  HPI    OV 03/19/2015  Chief Complaint  Patient presents with  . Follow-up    Pt had a left humerus fracture in Jan 2017 - pt will not need surgery. Pt states his brearhing is doing well. Pt denies cough and CP/tightness.    81 year old male with idiopathic pulmonary fibrosis and chronic hypoxemic respiratory failure on 4-6 liters of oxygen. He did not tolerate esbriet and decided not to take ofev. He presents for routine 3 month follow-up with his wife Jan works at [employer]. He reports overall stability and respirator status. No changes. In the interim he had an accident and had a mild fracture to his left humerus that was treated conservatively and now he is better. Also around Christmas 2016 he had volume overload issues and was diuresed and now he is back to baseline.  He is somewhat open after much counseling to reduce prednisone dosage    has a past medical history of Hypertension; Hyperlipidemia; Aortic stenosis; Dermatitis; Cleft palate; Interstitial lung disease (HCC); Pulmonary fibrosis (HCC); Syncope and collapse (07/18/2014); GERD (gastroesophageal reflux disease); and AAA (abdominal aortic aneurysm) (HCC).   reports that he quit smoking about 37 years ago. His smoking use included Cigarettes and Cigars. He has a 7.5 pack-year smoking history. He has never used smokeless tobacco.  Past Surgical History  Procedure Laterality Date  . Bilat cataract sx  2004  . Cholecystectomy  1999  . 7 surgeries for cleft palate  1938  . Oral surgery with bone graft from hip for cleft palate  1989  . Right groin hernia repair  1950's  . Rotator cuff tear, left shoulder    . Aortic valve replacement w/ pericardial tissue valve  10-15-09    Dr Barry Dienes  . Eye surgery      Allergies  Allergen Reactions  . Pravastatin Sodium Other (See Comments)    Sneezing episodes, can take medication and  tolerates reaction.  . Pirfenidone Nausea And Vomiting    Immunization History  Administered Date(s) Administered  . Influenza Split 10/26/2011, 10/21/2014  . Influenza Whole 09/27/2012  . Influenza,inj,Quad PF,36+ Mos 09/28/2013  . Pneumococcal Conjugate-13 09/28/2013  . Pneumococcal Polysaccharide-23 09/25/2009  . Tdap 01/25/2011  . Zoster 10/25/2012    Family History  Problem Relation Age of Onset  . Heart attack Mother   . Cancer Father     lung and stomach  . Heart attack Father   . Cancer Sister     bone  . Diabetes Sister   . Heart murmur Brother   . Hypertension Brother   . Hyperlipidemia Brother   . Heart attack Brother      Current outpatient prescriptions:  .  AMBULATORY NON FORMULARY MEDICATION, Rollator rolling walker with seat.  Diagnosis Physical Deconditioning 799.3, Disp: 1 Units, Rfl: 0 .  aspirin 81 MG tablet, Take 81 mg by mouth every morning. , Disp: , Rfl:  .  ferrous sulfate 325 (65 FE) MG tablet, TAKE 1 TABLET BY MOUTH DAILY WITH BREAKFAST, Disp: 90 tablet, Rfl: 0 .  finasteride (PROSCAR) 5 MG tablet, Take 1 tablet (5 mg total) by mouth daily., Disp: 90 tablet, Rfl: 1 .  furosemide (LASIX) 20 MG tablet, Take 1 tablet (20 mg total) by mouth daily., Disp: 90 tablet, Rfl: 1 .  HYDROcodone-acetaminophen (NORCO/VICODIN) 5-325 MG tablet, Take 0.5 tablets by mouth every 8 (eight)  hours as needed for moderate pain., Disp: 30 tablet, Rfl: 0 .  ketoconazole (NIZORAL) 2 % cream, Apply 1 application topically 2 (two) times daily. To affected areas., Disp: 60 g, Rfl: 3 .  loratadine (CLARITIN) 10 MG tablet, Take 10 mg by mouth every morning. , Disp: , Rfl:  .  midodrine (PROAMATINE) 2.5 MG tablet, TAKE 1 TABLET BY MOUTH TWICE DAILY WITH A MEAL (Patient taking differently: TAKE 1 TABLET BY MOUTH DAILY WITH A MEAL), Disp: 180 tablet, Rfl: 0 .  Multiple Vitamin (MULTIVITAMIN) tablet, Take 1 tablet by mouth daily., Disp: , Rfl:  .  NON FORMULARY, Place 4 L into the  nose daily. 3-4 liters and with activity 6 liters, Disp: , Rfl:  .  pantoprazole (PROTONIX) 40 MG tablet, TAKE 1 TABLET BY MOUTH TWICE DAILY BEFORE A MEAL, Disp: 120 tablet, Rfl: 1 .  pravastatin (PRAVACHOL) 40 MG tablet, Take 1 tablet by mouth at  bedtime, Disp: 90 tablet, Rfl: 3 .  predniSONE (DELTASONE) 10 MG tablet, Take 1 tablet (10 mg total) by mouth daily with breakfast., Disp: 90 tablet, Rfl: 2 .  triamcinolone (NASACORT ALLERGY 24HR) 55 MCG/ACT AERO nasal inhaler, Place 2 sprays into the nose daily. (Patient taking differently: Place 2 sprays into the nose daily as needed. ), Disp: 1 Inhaler, Rfl: 12 .  [DISCONTINUED] tadalafil (CIALIS) 20 MG tablet, Take 20 mg by mouth daily as needed. Take one about 1-2 hours before sexual intercourse , Disp: , Rfl:      Review of Systems According to history of present illness    Objective:   Physical Exam  Constitutional: He is oriented to person, place, and time. He appears well-developed and well-nourished. No distress.  HENT:  Head: Normocephalic and atraumatic.  Right Ear: External ear normal.  Left Ear: External ear normal.  Mouth/Throat: Oropharynx is clear and moist. No oropharyngeal exudate.  Oxygen on Left lip repair and speaks with similar  voice  Eyes: Conjunctivae and EOM are normal. Pupils are equal, round, and reactive to light. Right eye exhibits no discharge. Left eye exhibits no discharge. No scleral icterus.  Neck: Normal range of motion. Neck supple. No JVD present. No tracheal deviation present. No thyromegaly present.  Cardiovascular: Normal rate, regular rhythm and intact distal pulses.  Exam reveals no gallop and no friction rub.   No murmur heard. Pulmonary/Chest: Effort normal. No respiratory distress. He has no wheezes. He has rales. He exhibits no tenderness.  Abdominal: Soft. Bowel sounds are normal. He exhibits no distension and no mass. There is no tenderness. There is no rebound and no guarding.   Musculoskeletal: Normal range of motion. He exhibits no edema or tenderness.  Lymphadenopathy:    He has no cervical adenopathy.  Neurological: He is alert and oriented to person, place, and time. He has normal reflexes. No cranial nerve deficit. Coordination normal.  Skin: Skin is warm and dry. No rash noted. He is not diaphoretic. No erythema. No pallor.  Psychiatric: He has a normal mood and affect. His behavior is normal. Judgment and thought content normal.  Nursing note and vitals reviewed.  Filed Vitals:   03/19/15 1341  BP: 112/70  Pulse: 103  Height:  (1.854 m)  Weight: 215 lb 6.4 oz (97.705 kg)  SpO2: 96%       Assessment:       ICD-9-CM ICD-10-CM   1. IPF (idiopathic pulmonary fibrosis) (HCC) 516.31 J84.112 Pulmonary Function Test  2. Chronic hypoxemic respiratory failure (HCC) 161.09  J96.11    799.02         Plan:     Stabled di sease. We discussed tapering of prednisone. He had a lot of questions. After full discussion about the pros and cons he is willing to reduce prednisone to 5 mg per day and given a trial. We will see him again in 3-4 months with full pulmonary function test. In between if he feels he is withdrawing from prednisone or has respiratory worsening he will call us and try to come sooner  He and wife agreeable to plan  (> 50% of this 15 min visit spent in face to face counseling or/and coordination of care)   Dr. Kalman Shan, M.D., [facility].C.P Pulmonary and Critical Care Medicine Staff Physician [facility] [place] [facility] Pager: [phone], If no answer or between  15:00h - 7:00h: call [phone]  03/19/2015 2:12 PM

## 2015-03-19 NOTE — Patient Instructions (Addendum)
ICD-9-CM ICD-10-CM   1. IPF (idiopathic pulmonary fibrosis) (HCC) 516.31 J84.112   2. Chronic hypoxemic respiratory failure (HCC) 518.83 J96.11    799.02     Disease is stable  Continue oxygen 4-6 liters Reduce prednisone to 5 mg per day and monitor yourself Do full pulmonary function test in 3-4 months  Follow-up - Return to see me Dr. Marchelle Gearing after full pulmonary function test in 3-4 months

## 2015-03-20 DIAGNOSIS — I4891 Unspecified atrial fibrillation: Secondary | ICD-10-CM | POA: Diagnosis not present

## 2015-03-20 DIAGNOSIS — J8410 Pulmonary fibrosis, unspecified: Secondary | ICD-10-CM | POA: Diagnosis not present

## 2015-03-20 DIAGNOSIS — M6281 Muscle weakness (generalized): Secondary | ICD-10-CM | POA: Diagnosis not present

## 2015-03-20 DIAGNOSIS — I509 Heart failure, unspecified: Secondary | ICD-10-CM | POA: Diagnosis not present

## 2015-03-20 DIAGNOSIS — S42202D Unspecified fracture of upper end of left humerus, subsequent encounter for fracture with routine healing: Secondary | ICD-10-CM | POA: Diagnosis not present

## 2015-03-20 DIAGNOSIS — I951 Orthostatic hypotension: Secondary | ICD-10-CM | POA: Diagnosis not present

## 2015-03-24 DIAGNOSIS — I509 Heart failure, unspecified: Secondary | ICD-10-CM | POA: Diagnosis not present

## 2015-03-24 DIAGNOSIS — J8410 Pulmonary fibrosis, unspecified: Secondary | ICD-10-CM | POA: Diagnosis not present

## 2015-03-24 DIAGNOSIS — S42202D Unspecified fracture of upper end of left humerus, subsequent encounter for fracture with routine healing: Secondary | ICD-10-CM | POA: Diagnosis not present

## 2015-03-24 DIAGNOSIS — I951 Orthostatic hypotension: Secondary | ICD-10-CM | POA: Diagnosis not present

## 2015-03-24 DIAGNOSIS — I4891 Unspecified atrial fibrillation: Secondary | ICD-10-CM | POA: Diagnosis not present

## 2015-03-24 DIAGNOSIS — M6281 Muscle weakness (generalized): Secondary | ICD-10-CM | POA: Diagnosis not present

## 2015-03-25 DIAGNOSIS — M6281 Muscle weakness (generalized): Secondary | ICD-10-CM | POA: Diagnosis not present

## 2015-03-25 DIAGNOSIS — S42202D Unspecified fracture of upper end of left humerus, subsequent encounter for fracture with routine healing: Secondary | ICD-10-CM | POA: Diagnosis not present

## 2015-03-25 DIAGNOSIS — I509 Heart failure, unspecified: Secondary | ICD-10-CM | POA: Diagnosis not present

## 2015-03-25 DIAGNOSIS — J8410 Pulmonary fibrosis, unspecified: Secondary | ICD-10-CM | POA: Diagnosis not present

## 2015-03-25 DIAGNOSIS — I951 Orthostatic hypotension: Secondary | ICD-10-CM | POA: Diagnosis not present

## 2015-03-25 DIAGNOSIS — I4891 Unspecified atrial fibrillation: Secondary | ICD-10-CM | POA: Diagnosis not present

## 2015-03-26 DIAGNOSIS — I951 Orthostatic hypotension: Secondary | ICD-10-CM | POA: Diagnosis not present

## 2015-03-26 DIAGNOSIS — M6281 Muscle weakness (generalized): Secondary | ICD-10-CM | POA: Diagnosis not present

## 2015-03-26 DIAGNOSIS — S42202D Unspecified fracture of upper end of left humerus, subsequent encounter for fracture with routine healing: Secondary | ICD-10-CM | POA: Diagnosis not present

## 2015-03-26 DIAGNOSIS — I4891 Unspecified atrial fibrillation: Secondary | ICD-10-CM | POA: Diagnosis not present

## 2015-03-26 DIAGNOSIS — I509 Heart failure, unspecified: Secondary | ICD-10-CM | POA: Diagnosis not present

## 2015-03-26 DIAGNOSIS — J8410 Pulmonary fibrosis, unspecified: Secondary | ICD-10-CM | POA: Diagnosis not present

## 2015-03-27 ENCOUNTER — Telehealth: Payer: Self-pay | Admitting: Family Medicine

## 2015-03-27 ENCOUNTER — Ambulatory Visit (INDEPENDENT_AMBULATORY_CARE_PROVIDER_SITE_OTHER): Payer: Medicare Other | Admitting: Family Medicine

## 2015-03-27 ENCOUNTER — Encounter: Payer: Self-pay | Admitting: Family Medicine

## 2015-03-27 ENCOUNTER — Encounter: Payer: Self-pay | Admitting: Sports Medicine

## 2015-03-27 ENCOUNTER — Ambulatory Visit: Payer: Medicare Other | Admitting: Sports Medicine

## 2015-03-27 VITALS — BP 92/50 | HR 84 | Wt 214.0 lb

## 2015-03-27 VITALS — BP 92/50 | HR 84 | Ht 73.0 in | Wt 214.0 lb

## 2015-03-27 DIAGNOSIS — S42202D Unspecified fracture of upper end of left humerus, subsequent encounter for fracture with routine healing: Secondary | ICD-10-CM

## 2015-03-27 DIAGNOSIS — L819 Disorder of pigmentation, unspecified: Secondary | ICD-10-CM

## 2015-03-27 DIAGNOSIS — R030 Elevated blood-pressure reading, without diagnosis of hypertension: Secondary | ICD-10-CM

## 2015-03-27 DIAGNOSIS — M7989 Other specified soft tissue disorders: Secondary | ICD-10-CM

## 2015-03-27 DIAGNOSIS — IMO0001 Reserved for inherently not codable concepts without codable children: Secondary | ICD-10-CM

## 2015-03-27 DIAGNOSIS — J9611 Chronic respiratory failure with hypoxia: Secondary | ICD-10-CM

## 2015-03-27 MED ORDER — KETOCONAZOLE 2 % EX CREA: 1.0000 "application " | TOPICAL_CREAM | Freq: Two times a day (BID) | CUTANEOUS | Status: DC

## 2015-03-27 NOTE — Assessment & Plan Note (Signed)
Pain-free, full range of motion, good strength, good function, may discontinue home health physical therapy, return to see me on an as-needed basis.

## 2015-03-27 NOTE — Progress Notes (Signed)
CC: [name] is a 81 y.o. male is here for PT/OT   Subjective: HPI:  Follow-up fluid retention: He is taking10 mg a first night on a daily basis. No fluctuation in home weight checks. He denies orthopnea nor peripheral edema provided he wears his compression stockings on a daily basis. Denies any new shortness of breath.  Follow-up elevated blood pressure: He currently not taking any antihypertensives. He denies lightheadedness nor new motor or sensory disturbances. No chest pain  He's requesting a refill on ketoconazole. It has helped greatly with the reduction of redness and itching on his feet. He he is about to run out of refills and request refills today.  Follow-up respiratory failure: He denies any new shortness of breath since his prednisone was cut down to 7 mg daily. He's been coughing at night however he admits that he eats a lot of cookies before he goes to bed and gets reflux symptoms. He denies any cough during the day or wheezing. Denies fevers or chills   Review Of Systems Outlined In HPI  Past Medical History  Diagnosis Date  . Hypertension   . Hyperlipidemia   . Aortic stenosis     AVR 2011  . Dermatitis     poison ivy  . Cleft palate   . Interstitial lung disease (HCC)   . Pulmonary fibrosis (HCC)   . Syncope and collapse 07/18/2014  . GERD (gastroesophageal reflux disease)   . AAA (abdominal aortic aneurysm) [facility])     Past Surgical History  Procedure Laterality Date  . Bilat cataract sx  2004  . Cholecystectomy  1999  . 7 surgeries for cleft palate  1938  . Oral surgery with bone graft from hip for cleft palate  1989  . Right groin hernia repair  1950's  . Rotator cuff tear, left shoulder    . Aortic valve replacement w/ pericardial tissue valve  10-15-09    Dr Barry Dienes  . Eye surgery     Family History  Problem Relation Age of Onset  . Heart attack Mother   . Cancer Father     lung and stomach  . Heart attack Father   . Cancer Sister      bone  . Diabetes Sister   . Heart murmur Brother   . Hypertension Brother   . Hyperlipidemia Brother   . Heart attack Brother     Social History   Social History  . Marital Status: Married    Spouse Name: N/A  . Number of Children: N/A  . Years of Education: N/A   Occupational History  . Retired    Social History Main Topics  . Smoking status: Former Smoker -- 1.50 packs/day for 5 years    Types: Cigarettes, Cigars    Quit date: 09/08/1977  . Smokeless tobacco: Never Used  . Alcohol Use: No  . Drug Use: No  . Sexual Activity: Not on file   Other Topics Concern  . Not on file   Social History Narrative     Objective: BP 92/50 mmHg  Pulse 84  Wt 214 lb (97.07 kg)  General: Alert and Oriented, No Acute Distress HEENT: Pupils equal, round, reactive to light. Conjunctivae clear.  Moist mucous membranes Lungs: comfortable work of breathing with mild crackles in the lower lung fields without rhonchi or wheezing. Cardiac: Regular rate and rhythm. Normal S1/S2.  No murmurs, rubs, nor gallops.   Extremities: No peripheral edema.  Strong peripheral pulses.  Mental Status: No  depression, anxiety, nor agitation. Skin: Warm and dry.  Assessment & Plan: Kaleem was seen today for pt/ot.  Diagnoses and all orders for this visit:  Chronic hypoxemic respiratory failure (HCC) -     ketoconazole (NIZORAL) 2 % cream; Apply 1 application topically 2 (two) times daily. To affected areas.  Elevated blood pressure  Swelling of lower extremity  Discoloration of skin of foot -     ketoconazole (NIZORAL) 2 % cream; Apply 1 application topically 2 (two) times daily. To affected areas.   Chronic hypoxemia: Currently controlled on 7 mg of prednisone daily. Elevated blood pressure: Resolved Swelling of lower extremities: Controlled with low dose of Lasix and daily compression stockings. Discussed weight checks daily and contact me if he notices any 2-3 pound weight gain   overnight. Discoloration of the feet: Likely due to tinea therefore continue as needed ketoconazole.  Verbal orders to [facility] to discontinue PT and OT.   Return in about 3 months (around 06/27/2015) for Breathing and Fluid.

## 2015-03-27 NOTE — Progress Notes (Signed)
  Subjective: 8 weeks post left proximal humeral fracture, pain-free with good use of the arm.   Objective: General: Well-developed, well-nourished, and in no acute distress. Left Shoulder: Inspection reveals no abnormalities, atrophy or asymmetry. Palpation is normal with no tenderness over AC joint or bicipital groove. ROM is full in all planes. Rotator cuff strength normal throughout. No signs of impingement with negative Neer and Hawkin's tests, empty can. Speeds and Yergason's tests normal. No labral pathology noted with negative Obrien's, negative crank, negative clunk, and good stability. Normal scapular function observed. No painful arc and no drop arm sign. No apprehension sign Assessment/plan:

## 2015-03-27 NOTE — Addendum Note (Signed)
Addended by: Juel Burrow on: 03/27/2015 08:57 AM   Modules accepted: Medications

## 2015-03-27 NOTE — Telephone Encounter (Signed)
Notified [facility] to discontinue PT/OT.  Pt's wife had already told staff also.

## 2015-03-27 NOTE — Telephone Encounter (Signed)
Can you please call [facility] at [phone] and relay a verbal order to discontinue PT and OT for this patient.

## 2015-04-03 ENCOUNTER — Other Ambulatory Visit: Payer: Self-pay | Admitting: Family Medicine

## 2015-04-22 ENCOUNTER — Other Ambulatory Visit: Payer: Self-pay | Admitting: Family Medicine

## 2015-05-05 ENCOUNTER — Encounter: Payer: Self-pay | Admitting: Family Medicine

## 2015-05-05 ENCOUNTER — Ambulatory Visit (INDEPENDENT_AMBULATORY_CARE_PROVIDER_SITE_OTHER): Payer: Medicare Other | Admitting: Family Medicine

## 2015-05-05 VITALS — BP 100/66 | HR 137 | Wt 217.0 lb

## 2015-05-05 DIAGNOSIS — R3915 Urgency of urination: Secondary | ICD-10-CM

## 2015-05-05 LAB — POCT URINALYSIS DIPSTICK
Bilirubin, UA: NEGATIVE
GLUCOSE UA: NEGATIVE
KETONES UA: NEGATIVE
Nitrite, UA: NEGATIVE
Protein, UA: 100
SPEC GRAV UA: 1.02
UROBILINOGEN UA: 4
pH, UA: 6.5

## 2015-05-05 MED ORDER — SULFAMETHOXAZOLE-TRIMETHOPRIM 800-160 MG PO TABS: 1.0000 | ORAL_TABLET | Freq: Two times a day (BID) | ORAL | Status: DC

## 2015-05-05 NOTE — Progress Notes (Signed)
CC: [name] is a 81 y.o. male is here for UTI Sx   Subjective: HPI:  Over the weekend patient began to have urinary urgency with the need to urinate every hour. He is waking up every hour to urinate in the evening as well. He has a little bit of dysuria that is localized to the shaft of the penis. He also notes that his urine is malodorous and cloudy. No interventions as of yet. Feels like prior urinary tract infection from 2015. He denies fevers, chills, flank pain or abdominal pain. No nausea or vomiting. No confusion   Review Of Systems Outlined In HPI  Past Medical History  Diagnosis Date  . Hypertension   . Hyperlipidemia   . Aortic stenosis     AVR 2011  . Dermatitis     poison ivy  . Cleft palate   . Interstitial lung disease (HCC)   . Pulmonary fibrosis (HCC)   . Syncope and collapse 07/18/2014  . GERD (gastroesophageal reflux disease)   . AAA (abdominal aortic aneurysm) [facility](HCC)     Past Surgical History  Procedure Laterality Date  . Bilat cataract sx  2004  . Cholecystectomy  1999  . 7 surgeries for cleft palate  1938  . Oral surgery with bone graft from hip for cleft palate  1989  . Right groin hernia repair  1950's  . Rotator cuff tear, left shoulder    . Aortic valve replacement w/ pericardial tissue valve  10-15-09    Dr Barry Dieneswens  . Eye surgery     Family History  Problem Relation Age of Onset  . Heart attack Mother   . Cancer Father     lung and stomach  . Heart attack Father   . Cancer Sister     bone  . Diabetes Sister   . Heart murmur Brother   . Hypertension Brother   . Hyperlipidemia Brother   . Heart attack Brother     Social History   Social History  . Marital Status: Married    Spouse Name: N/A  . Number of Children: N/A  . Years of Education: N/A   Occupational History  . Retired    Social History Main Topics  . Smoking status: Former Smoker -- 1.50 packs/day for 5 years    Types: Cigarettes, Cigars    Quit date: 09/08/1977   . Smokeless tobacco: Never Used  . Alcohol Use: No  . Drug Use: No  . Sexual Activity: Not on file   Other Topics Concern  . Not on file   Social History Narrative     Objective: BP 100/66 mmHg  Pulse 137  Wt 217 lb (98.431 kg)  Vital signs reviewed. General: Alert and Oriented, No Acute Distress HEENT: Pupils equal, round, reactive to light. Conjunctivae clear.  External ears unremarkable.  Moist mucous membranes. Lungs: Clear and comfortable work of breathing, speaking in full sentences without accessory muscle use. Cardiac: Regular rate and rhythm.  Neuro: CN II-XII grossly intact, gait normal. Extremities: No peripheral edema.  Strong peripheral pulses.  Mental Status: No depression, anxiety, nor agitation. Logical though process. Skin: Warm and dry.  Assessment & Plan: [name] was seen today for uti sx.  Diagnoses and all orders for this visit:  Urinary urgency -     POCT Urinalysis Dipstick -     Urine culture -     sulfamethoxazole-trimethoprim (BACTRIM DS,SEPTRA DS) 800-160 MG tablet; Take 1 tablet by mouth 2 (two) times daily.  Urinalysis and symptoms highly suggestive of urinary tract infection therefore start Bactrim pending culture. Signs and symptoms requring emergent/urgent reevaluation were discussed with the patient.  No Follow-up on file.

## 2015-05-06 LAB — URINE CULTURE
COLONY COUNT: NO GROWTH
ORGANISM ID, BACTERIA: NO GROWTH

## 2015-05-12 ENCOUNTER — Telehealth: Payer: Self-pay

## 2015-05-12 NOTE — Telephone Encounter (Signed)
Wife stated that her husband is better with the urine Sx but he is having cold Sx.

## 2015-05-12 NOTE — Telephone Encounter (Signed)
Wife notified.

## 2015-05-12 NOTE — Telephone Encounter (Signed)
The antibiotic given for his urine issues should have protected him from any bacterial sinus infection so what he's dealing with is most likely viral but call later this week if it worsens.

## 2015-05-20 ENCOUNTER — Other Ambulatory Visit: Payer: Self-pay | Admitting: Family Medicine

## 2015-05-22 ENCOUNTER — Other Ambulatory Visit: Payer: Self-pay | Admitting: Family Medicine

## 2015-06-16 ENCOUNTER — Ambulatory Visit (INDEPENDENT_AMBULATORY_CARE_PROVIDER_SITE_OTHER): Payer: Medicare Other | Admitting: Internal Medicine

## 2015-06-16 ENCOUNTER — Telehealth: Payer: Self-pay | Admitting: Internal Medicine

## 2015-06-16 ENCOUNTER — Telehealth: Payer: Self-pay | Admitting: Family Medicine

## 2015-06-16 DIAGNOSIS — J84112 Idiopathic pulmonary fibrosis: Secondary | ICD-10-CM | POA: Diagnosis not present

## 2015-06-16 LAB — PULMONARY FUNCTION TEST
DL/VA % pred: 98 %
DL/VA: 4.62 ml/min/mmHg/L
DLCO UNC % PRED: 23 %
DLCO UNC: 8.28 ml/min/mmHg
DLCO cor % pred: 29 %
DLCO cor: 10.28 ml/min/mmHg
FEF 25-75 POST: 2.08 L/s
FEF 25-75 Pre: 2.05 L/sec
FEF2575-%CHANGE-POST: 1 %
FEF2575-%PRED-POST: 93 %
FEF2575-%Pred-Pre: 92 %
FEV1-%Change-Post: -2 %
FEV1-%PRED-POST: 37 %
FEV1-%PRED-PRE: 38 %
FEV1-POST: 1.19 L
FEV1-PRE: 1.22 L
FEV1FVC-%Change-Post: 0 %
FEV1FVC-%PRED-PRE: 128 %
FEV6-%CHANGE-POST: -2 %
FEV6-%PRED-POST: 30 %
FEV6-%Pred-Pre: 31 %
FEV6-POST: 1.28 L
FEV6-Pre: 1.31 L
FEV6FVC-%PRED-POST: 106 %
FEV6FVC-%Pred-Pre: 106 %
FVC-%CHANGE-POST: -2 %
FVC-%PRED-PRE: 29 %
FVC-%Pred-Post: 28 %
FVC-POST: 1.28 L
FVC-PRE: 1.31 L
PRE FEV1/FVC RATIO: 93 %
Post FEV1/FVC ratio: 93 %
Post FEV6/FVC ratio: 100 %
Pre FEV6/FVC Ratio: 100 %

## 2015-06-16 NOTE — Telephone Encounter (Signed)
Pt wife called clinic to let PCP know the Pt is having some increase in SOB, even while being on 8L of continuous O2. Wife reports Pt sleeps on multiple pillows so he doesn't lay flat and when he gets up and walks to the bathroom he will have to stop halfway to catch his breath. Pt is going to his pulmonologist today at 4pm for a PFT, but will not see the pulmonologist until Thursday. Pt wife wants to know if he should come in and see PCP first, or what PCP would recommend. Will route.

## 2015-06-16 NOTE — Progress Notes (Signed)
PFT done today. 

## 2015-06-16 NOTE — Telephone Encounter (Signed)
Spoke with pt's wife as pt was in PFT room. She states that pt has increasing SOB over the past few weeks and is having trouble sleeping as can't find comfortable position to sleep in. Is sleeping with 4-5 pillows and on his side. She states that pt has increased his O2 to 8L continuous and is still SOB. Advised her that there are no available providers in the office this late in the day. Appt made with TP for 9am tomorrow. She was advised that if his symptoms worsen she needs to take him to the ED for eva/treat or call our o/c physician. Nothing further needed.

## 2015-06-16 NOTE — Telephone Encounter (Signed)
Thursday sounds too far off, I'd recommend he see me or one of my partners before thurdsay.

## 2015-06-16 NOTE — Telephone Encounter (Signed)
Pt added to PCP schedule tomorrow afternoon.

## 2015-06-17 ENCOUNTER — Ambulatory Visit: Payer: Medicare Other | Admitting: Family Medicine

## 2015-06-17 ENCOUNTER — Ambulatory Visit (INDEPENDENT_AMBULATORY_CARE_PROVIDER_SITE_OTHER): Payer: Medicare Other | Admitting: Adult Health

## 2015-06-17 ENCOUNTER — Other Ambulatory Visit (INDEPENDENT_AMBULATORY_CARE_PROVIDER_SITE_OTHER): Payer: Medicare Other

## 2015-06-17 ENCOUNTER — Ambulatory Visit (INDEPENDENT_AMBULATORY_CARE_PROVIDER_SITE_OTHER)
Admission: RE | Admit: 2015-06-17 | Discharge: 2015-06-17 | Disposition: A | Discharge status: Home / Self Care | Payer: Medicare Other | Source: Ambulatory Visit | Attending: Adult Health | Admitting: Adult Health

## 2015-06-17 ENCOUNTER — Encounter: Payer: Self-pay | Admitting: Adult Health

## 2015-06-17 VITALS — BP 112/60 | HR 86 | Temp 98.3°F | Ht 73.0 in | Wt 214.0 lb

## 2015-06-17 DIAGNOSIS — J9611 Chronic respiratory failure with hypoxia: Secondary | ICD-10-CM

## 2015-06-17 DIAGNOSIS — R0600 Dyspnea, unspecified: Secondary | ICD-10-CM

## 2015-06-17 DIAGNOSIS — J84112 Idiopathic pulmonary fibrosis: Secondary | ICD-10-CM

## 2015-06-17 DIAGNOSIS — J8410 Pulmonary fibrosis, unspecified: Secondary | ICD-10-CM

## 2015-06-17 DIAGNOSIS — R0602 Shortness of breath: Secondary | ICD-10-CM | POA: Diagnosis not present

## 2015-06-17 LAB — CBC WITH DIFFERENTIAL/PLATELET
BASOS ABS: 0 10*3/uL (ref 0.0–0.1)
Basophils Relative: 0.1 % (ref 0.0–3.0)
EOS ABS: 0 10*3/uL (ref 0.0–0.7)
Eosinophils Relative: 0.2 % (ref 0.0–5.0)
HCT: 27.1 % — ABNORMAL LOW (ref 39.0–52.0)
Hemoglobin: 8.8 g/dL — ABNORMAL LOW (ref 13.0–17.0)
LYMPHS PCT: 4.3 % — AB (ref 12.0–46.0)
Lymphs Abs: 0.7 10*3/uL (ref 0.7–4.0)
MCHC: 32.5 g/dL (ref 30.0–36.0)
MCV: 81.1 fl (ref 78.0–100.0)
MONOS PCT: 8.3 % (ref 3.0–12.0)
Monocytes Absolute: 1.3 10*3/uL — ABNORMAL HIGH (ref 0.1–1.0)
Neutro Abs: 13.4 10*3/uL — ABNORMAL HIGH (ref 1.4–7.7)
PLATELETS: 123 10*3/uL — AB (ref 150.0–400.0)
RBC: 3.34 Mil/uL — AB (ref 4.22–5.81)
RDW: 18.4 % — ABNORMAL HIGH (ref 11.5–15.5)
WBC: 15.3 10*3/uL — AB (ref 4.0–10.5)

## 2015-06-17 LAB — BASIC METABOLIC PANEL
BUN: 17 mg/dL (ref 6–23)
CHLORIDE: 99 meq/L (ref 96–112)
CO2: 36 mEq/L — ABNORMAL HIGH (ref 19–32)
CREATININE: 1.06 mg/dL (ref 0.40–1.50)
Calcium: 9.8 mg/dL (ref 8.4–10.5)
GFR: 71.66 mL/min (ref >60.00)
Glucose, Bld: 103 mg/dL — ABNORMAL HIGH (ref 70–99)
POTASSIUM: 5.5 meq/L — AB (ref 3.5–5.1)
SODIUM: 140 meq/L (ref 135–145)

## 2015-06-17 LAB — BRAIN NATRIURETIC PEPTIDE: Pro B Natriuretic peptide (BNP): 210 pg/mL — ABNORMAL HIGH (ref 0.0–100.0)

## 2015-06-17 NOTE — Assessment & Plan Note (Signed)
Adjust oxygen to keep O2 saturation greater than 90%

## 2015-06-17 NOTE — Progress Notes (Signed)
Quick Note:  [id number] ______ 

## 2015-06-17 NOTE — Progress Notes (Signed)
Subjective:    Patient ID: [name], male    DOB: Apr 30, 1934, 81 y.o.   MRN: [id number]  HPI 81 y.o.  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 W/ chronic resp failure on O2. And chronic steroids  Previously intolerant to Esbriet.  Hx of severe AS s/p AVR , CAD  Event /TEST  Pulmonary rehab completed 10/2012  06/17/2015 Acute OV  Patient presents for an acute office visit He complains of increased SOB, fatigue, PND and chest congestion/tightness x 2 weeks.  Denies any sinus pressure, fever, chills, nausea or vomiting.  Patient says since decreasing prednisone down to 5 mg 3 months ago. He seems that his breathing has not been as good. He also had a left shoulder fracture in January of this year and has been more in active due to this. He is now finished rehabilitation. He feels that his breathing has worsened over the last 2 weeks with increased shortness of breath with activity, more leg swelling. Patient had previously been on Lasix but this is been held due to lower blood pressure readings. Weight is up 7 pounds. She denies any fever or discolored mucus, chest pain, abdominal pain, nausea, vomiting or diarrhea. Appetite is good. Patient does have 4 pillow orthopnea , chronically. Last 2-D echo was in January 2017 EF was 50-55%, prosthetic aortic valve was present and functioning normally.. Pulmonary artery pressure 32. Moderate pulmonary valve regurgitation. Patient had pulmonary function test yesterday that showed a decrease in lung function since 2016 FEC 28%, FEV1 37%, ratio 93, no significant bronchodilator response, DLCO 23%. Pulmonary function test in November 2016 showed a DLCO at 35%.    Past Medical History  Diagnosis Date  . Hypertension   . Hyperlipidemia   . Aortic stenosis     AVR 2011  . Dermatitis     poison ivy  . Cleft palate   . Interstitial lung disease (HCC)   . Pulmonary fibrosis  (HCC)   . Syncope and collapse 07/18/2014  . GERD (gastroesophageal reflux disease)   . AAA (abdominal aortic aneurysm) [facility])    Current Outpatient Prescriptions on File Prior to Visit  Medication Sig Dispense Refill  . AMBULATORY NON FORMULARY MEDICATION Rollator rolling walker with seat.  Diagnosis Physical Deconditioning 799.3 1 Units 0  . aspirin 81 MG tablet Take 81 mg by mouth every morning.     . ferrous sulfate 325 (65 FE) MG tablet TAKE 1 TABLET BY MOUTH DAILY WITH BREAKFAST 90 tablet 0  . finasteride (PROSCAR) 5 MG tablet Take 1 tablet by mouth  daily 90 tablet 1  . furosemide (LASIX) 20 MG tablet Take 1 tablet (20 mg total) by mouth daily. (Patient taking differently: Take 10 mg by mouth daily. ) 90 tablet 1  . HYDROcodone-acetaminophen (NORCO/VICODIN) 5-325 MG tablet Take 0.5 tablets by mouth every 8 (eight) hours as needed for moderate pain. 30 tablet 0  . ketoconazole (NIZORAL) 2 % cream Apply 1 application topically 2 (two) times daily. To affected areas. 60 g 3  . loratadine (CLARITIN) 10 MG tablet Take 10 mg by mouth every morning.     . midodrine (PROAMATINE) 2.5 MG tablet TAKE 1 TABLET BY MOUTH  TWICE DAILY WITH A MEAL (Patient taking differently: TAKE 1 TABLET BY MOUTH  TWICE DAILY WITH A MEAL (if BP is <120/80)) 180 tablet 0  . Multiple Vitamin (MULTIVITAMIN) tablet Take 1 tablet by mouth daily.    Marland Kitchen NON  FORMULARY Place 4 L into the nose daily. 3-4 liters and with activity 6 liters    . pantoprazole (PROTONIX) 40 MG tablet TAKE 1 TABLET BY MOUTH TWICE DAILY BEFORE A MEAL 180 tablet 0  . pravastatin (PRAVACHOL) 40 MG tablet Take 1 tablet by mouth at  bedtime 90 tablet 3  . predniSONE (DELTASONE) 10 MG tablet Take 1 tablet by mouth  daily with breakfast (Patient taking differently: Take 1/2 tablet by mouth  daily with breakfast) 90 tablet 0  . triamcinolone (NASACORT ALLERGY 24HR) 55 MCG/ACT AERO nasal inhaler Place 2 sprays into the nose daily. (Patient taking differently:  Place 2 sprays into the nose daily as needed. ) 1 Inhaler 12  . [DISCONTINUED] tadalafil (CIALIS) 20 MG tablet Take 20 mg by mouth daily as needed. Take one about 1-2 hours before sexual intercourse      No current facility-administered medications on file prior to visit.      Review of Systems Constitutional:   No  weight loss, night sweats,  Fevers, chills, +fatigue, or  lassitude.  HEENT:   No headaches,  Difficulty swallowing,  Tooth/dental problems, or  Sore throat,                No sneezing, itching, ear ache, nasal congestion, post nasal drip,   CV:  No chest pain,  Orthopnea, PND, swelling in lower extremities, anasarca, dizziness, palpitations, syncope.   GI  No heartburn, indigestion, abdominal pain, nausea, vomiting, diarrhea, change in bowel habits, loss of appetite, bloody stools.   Resp:  .  No chest wall deformity  Skin: no rash or lesions.  GU: no dysuria, change in color of urine, no urgency or frequency.  No flank pain, no hematuria   MS:  No joint pain or swelling.  No decreased range of motion.  No back pain.  Psych:  No change in mood or affect. No depression or anxiety.  No memory loss.         Objective:   Physical Exam Filed Vitals:   06/17/15 0859  BP: 112/60  Pulse: 86  Temp: 98.3 F (36.8 C)  TempSrc: Oral  Height: 6\' 1"  (1.854 m)  Weight: 214 lb (97.07 kg)  SpO2: 98%   GEN: A/Ox3; pleasant , NAD, chronically ill appearing in wc on O2 (POC at 5l/m )  HEENT:  [place]/AT,  EACs-clear, TMs-wnl, NOSE-clear, THROAT-clear, no lesions, no postnasal drip or exudate noted.   NECK:  Supple w/ fair ROM; no JVD; normal carotid impulses w/o bruits; no thyromegaly or nodules palpated; no lymphadenopathy.  RESP  BB  Crackles , no accessory muscle use, no dullness to percussion  CARD:  RRR, no m/r/g  , 1+  peripheral edema/support stockings , pulses intact, no cyanosis or clubbing.  GI:   Soft & nt; nml bowel sounds; no organomegaly or masses  detected.  Musco: Warm bil, no deformities or joint swelling noted.   Neuro: alert, no focal deficits noted.    Skin: Warm, no lesions or rashes  Tammy Parrett NP-C  [facility]  06/17/2015       Assessment & Plan:

## 2015-06-17 NOTE — Patient Instructions (Addendum)
Increase Prednisone 20mg  daily for 2 week then 10mg  daily and hold at this dose.  Continue on 6l/m rest , 8 l/m activity  Begin Lasix 20mg  daily for 2 days .  Low salt diet.  Follow up with Dr. Marchelle Gearingamaswamy in 6 weeks and As needed   Labs and chest xray today .  Please contact office for sooner follow up if symptoms do not improve or worsen or seek emergency care

## 2015-06-17 NOTE — Assessment & Plan Note (Signed)
Questionable flare with suspected with mild volume overload. Check chest x-ray today. Labs with a BNP Patient also has a mild anemia on his recent PFT. We'll check a CBC.  Plan  Increase Prednisone 20mg  daily for 2 week then 10mg  daily and hold at this dose.  Continue on 6l/m rest , 8 l/m activity  Begin Lasix 20mg  daily for 2 days .  Low salt diet.  Follow up with Dr. Marchelle Gearingamaswamy in 6 weeks and As needed   Labs and chest xray today .  Please contact office for sooner follow up if symptoms do not improve or worsen or seek emergency care

## 2015-06-18 ENCOUNTER — Encounter: Payer: Self-pay | Admitting: Family Medicine

## 2015-06-19 ENCOUNTER — Ambulatory Visit: Payer: Medicare Other | Admitting: Internal Medicine

## 2015-06-19 ENCOUNTER — Encounter: Payer: Self-pay | Admitting: Adult Health

## 2015-06-19 NOTE — Telephone Encounter (Signed)
Spoke with pt's wife on the phone. Will close encounter.

## 2015-06-25 ENCOUNTER — Encounter: Payer: Self-pay | Admitting: Family Medicine

## 2015-06-27 ENCOUNTER — Encounter: Payer: Self-pay | Admitting: Family Medicine

## 2015-06-27 ENCOUNTER — Ambulatory Visit (INDEPENDENT_AMBULATORY_CARE_PROVIDER_SITE_OTHER): Payer: Medicare Other | Admitting: Family Medicine

## 2015-06-27 VITALS — BP 130/69 | HR 103 | Wt 212.0 lb

## 2015-06-27 DIAGNOSIS — D649 Anemia, unspecified: Secondary | ICD-10-CM | POA: Diagnosis not present

## 2015-06-27 DIAGNOSIS — E538 Deficiency of other specified B group vitamins: Secondary | ICD-10-CM

## 2015-06-27 DIAGNOSIS — E875 Hyperkalemia: Secondary | ICD-10-CM | POA: Diagnosis not present

## 2015-06-27 NOTE — Progress Notes (Signed)
CC: [name] is a 81 y.o. male is here for No chief complaint on file.   Subjective: HPI:  1 week ago he was found to have a hemoglobin of 8.8 in the setting of shortness of breath. He was started on a prednisone burst in his breathing has improved however he still has mild shortness of breath with exertion. He denies any recent bleeding or bruising abnormalities. He denies any blood in his stool. He is already taking a daily iron supplement but no other vitamin supplementation. He denies any abdominal pain or abnormal bowel movements. He has no gastrointestinal complaints. He denies any motor or sensory disturbances.   Review Of Systems Outlined In HPI  Past Medical History  Diagnosis Date  . Hypertension   . Hyperlipidemia   . Aortic stenosis     AVR 2011  . Dermatitis     poison ivy  . Cleft palate   . Interstitial lung disease (HCC)   . Pulmonary fibrosis (HCC)   . Syncope and collapse 07/18/2014  . GERD (gastroesophageal reflux disease)   . AAA (abdominal aortic aneurysm) [facility])     Past Surgical History  Procedure Laterality Date  . Bilat cataract sx  2004  . Cholecystectomy  1999  . 7 surgeries for cleft palate  1938  . Oral surgery with bone graft from hip for cleft palate  1989  . Right groin hernia repair  1950's  . Rotator cuff tear, left shoulder    . Aortic valve replacement w/ pericardial tissue valve  10-15-09    Dr Barry Dienes  . Eye surgery     Family History  Problem Relation Age of Onset  . Heart attack Mother   . Cancer Father     lung and stomach  . Heart attack Father   . Cancer Sister     bone  . Diabetes Sister   . Heart murmur Brother   . Hypertension Brother   . Hyperlipidemia Brother   . Heart attack Brother     Social History   Social History  . Marital Status: Married    Spouse Name: N/A  . Number of Children: N/A  . Years of Education: N/A   Occupational History  . Retired    Social History Main Topics  . Smoking status:  Former Smoker -- 1.50 packs/day for 5 years    Types: Cigarettes, Cigars    Quit date: 09/08/1977  . Smokeless tobacco: Never Used  . Alcohol Use: No  . Drug Use: No  . Sexual Activity: Not on file   Other Topics Concern  . Not on file   Social History Narrative     Objective: BP 130/69 mmHg  Pulse 103  Wt 212 lb (96.163 kg)  Vital signs reviewed. General: Alert and Oriented, No Acute Distress HEENT: Pupils equal, round, reactive to light. Conjunctivae clear.  External ears unremarkable.  Moist mucous membranes. Lungs: Clear and comfortable work of breathing, speaking in full sentences without accessory muscle use. Cardiac: Regular rate and rhythm.  Neuro: CN II-XII grossly intact, gait normal. Extremities: No peripheral edema.  Strong peripheral pulses.  Mental Status: No depression, anxiety, nor agitation. Logical though process. Skin: Warm and dry.  Assessment & Plan: Diagnoses and all orders for this visit:  Anemia, unspecified anemia type -     Iron and TIBC -     BASIC METABOLIC PANEL WITH GFR -     CBC -     Vitamin B12 -  Ferritin  Hyperkalemia -     Iron and TIBC -     BASIC METABOLIC PANEL WITH GFR -     CBC -     Vitamin B12 -     Ferritin  Vitamin B 12 deficiency -     Iron and TIBC -     BASIC METABOLIC PANEL WITH GFR -     CBC -     Vitamin B12 -     Ferritin   Anemia: We'll rule out GI bleed with stool cards 3, rule out iron deficiency despite taking oral iron, rule out return of his former finding B12 deficiency. Ultimate plan will be based on the above results.Signs and symptoms requring emergent/urgent reevaluation were discussed with the patient.  25 minutes spent face-to-face during visit today of which at least 50% was counseling or coordinating care regarding: 1. Anemia, unspecified anemia type   2. Hyperkalemia   3. Vitamin B 12 deficiency        Return in about 3 months (around 09/27/2015).

## 2015-06-28 LAB — IRON AND TIBC
%SAT: 20 % (ref 15–60)
Iron: 52 ug/dL (ref 50–180)
TIBC: 259 ug/dL (ref 250–425)
UIBC: 207 ug/dL (ref 125–400)

## 2015-06-28 LAB — BASIC METABOLIC PANEL WITH GFR
BUN: 17 mg/dL (ref 7–25)
CHLORIDE: 96 mmol/L — AB (ref 98–110)
CO2: 33 mmol/L — ABNORMAL HIGH (ref 20–31)
Calcium: 9.4 mg/dL (ref 8.6–10.3)
Creat: 0.92 mg/dL (ref 0.70–1.18)
GFR, EST NON AFRICAN AMERICAN: 79 mL/min (ref >=60)
GFR, Est African American: >89 mL/min (ref >=60)
GLUCOSE: 118 mg/dL — AB (ref 65–99)
POTASSIUM: 5.2 mmol/L (ref 3.5–5.3)
Sodium: 140 mmol/L (ref 135–146)

## 2015-06-28 LAB — CBC
HEMATOCRIT: 30.4 % — AB (ref 38.5–50.0)
HEMOGLOBIN: 9.7 g/dL — AB (ref 13.2–17.1)
MCH: 25.6 pg — ABNORMAL LOW (ref 27.0–33.0)
MCHC: 31.9 g/dL — AB (ref 32.0–36.0)
MCV: 80.2 fL (ref 80.0–100.0)
Platelets: 233 10*3/uL (ref 140–400)
RBC: 3.79 MIL/uL — AB (ref 4.20–5.80)
RDW: 17.9 % — ABNORMAL HIGH (ref 11.0–15.0)
WBC: 16.7 10*3/uL — AB (ref 3.8–10.8)

## 2015-06-28 LAB — VITAMIN B12: VITAMIN B 12: 844 pg/mL (ref 200–1100)

## 2015-06-28 LAB — FERRITIN: FERRITIN: 182 ng/mL (ref 20–380)

## 2015-06-30 ENCOUNTER — Telehealth: Payer: Self-pay | Admitting: Family Medicine

## 2015-06-30 MED ORDER — FERROUS SULFATE 325 (65 FE) MG PO TABS: 325.0000 mg | ORAL_TABLET | Freq: Two times a day (BID) | ORAL | Status: DC

## 2015-06-30 NOTE — Telephone Encounter (Signed)
Med update

## 2015-07-02 ENCOUNTER — Other Ambulatory Visit (INDEPENDENT_AMBULATORY_CARE_PROVIDER_SITE_OTHER): Payer: Medicare Other | Admitting: Family Medicine

## 2015-07-02 DIAGNOSIS — N912 Amenorrhea, unspecified: Secondary | ICD-10-CM

## 2015-07-02 DIAGNOSIS — D649 Anemia, unspecified: Secondary | ICD-10-CM

## 2015-07-02 LAB — HEMOCCULT GUIAC POC 1CARD (OFFICE)
FECAL OCCULT BLD: NEGATIVE
FECAL OCCULT BLD: NEGATIVE
Fecal Occult Blood, POC: NEGATIVE

## 2015-07-02 NOTE — Progress Notes (Signed)
[place], Will you please let patient know that his stool tests were without blood and reassuring. Continue the iron regimen and I'd like to recheck his hemoglobin in 3-4 weeks, we can do this with a fingerstick during an appt.

## 2015-07-02 NOTE — Progress Notes (Signed)
Wife notified.

## 2015-07-21 ENCOUNTER — Other Ambulatory Visit: Payer: Self-pay | Admitting: Family Medicine

## 2015-07-24 ENCOUNTER — Encounter: Payer: Self-pay | Admitting: Family Medicine

## 2015-07-24 ENCOUNTER — Ambulatory Visit (INDEPENDENT_AMBULATORY_CARE_PROVIDER_SITE_OTHER): Payer: Medicare Other | Admitting: Family Medicine

## 2015-07-24 ENCOUNTER — Telehealth: Payer: Self-pay | Admitting: Family Medicine

## 2015-07-24 VITALS — BP 86/47 | HR 106 | Wt 209.0 lb

## 2015-07-24 DIAGNOSIS — D508 Other iron deficiency anemias: Secondary | ICD-10-CM | POA: Diagnosis not present

## 2015-07-24 LAB — POCT HEMOGLOBIN: HEMOGLOBIN: 8.5 g/dL — AB (ref 14.1–18.1)

## 2015-07-24 NOTE — Telephone Encounter (Signed)
Pt notified.  Short stay center stated that this could be place in epic but you have to specify what type of iron you want to order.

## 2015-07-24 NOTE — Telephone Encounter (Signed)
[name], Aidin needs an iron infusion somewhat urgently (within the next week).  Can you contact the Short Stay department at [facility] and see how this can be arranged and if they have a pre-printed form that can be used to place this order?

## 2015-07-24 NOTE — Progress Notes (Signed)
CC: [name] is a 81 y.o. male is here for hemoglobin check   Subjective: HPI:  Follow-up anemia: He's been taking iron sulfate twice a day without any known side effects. He doesn't he feels much better than he did when he was here last month. He tells me his breathing feels better, he has more energy and overall just feels like his stamina has improved. He denies any new bleeding or bruising abnormalities. He denies any constipation. Denies any melena.  Denies any acute confusion, cognitive complaints or cough. He still using oxygen but denies any lightheadedness or shortness of breath.   Review Of Systems Outlined In HPI  Past Medical History  Diagnosis Date  . Hypertension   . Hyperlipidemia   . Aortic stenosis     AVR 2011  . Dermatitis     poison ivy  . Cleft palate   . Interstitial lung disease (HCC)   . Pulmonary fibrosis (HCC)   . Syncope and collapse 07/18/2014  . GERD (gastroesophageal reflux disease)   . AAA (abdominal aortic aneurysm) [facility](HCC)     Past Surgical History  Procedure Laterality Date  . Bilat cataract sx  2004  . Cholecystectomy  1999  . 7 surgeries for cleft palate  1938  . Oral surgery with bone graft from hip for cleft palate  1989  . Right groin hernia repair  1950's  . Rotator cuff tear, left shoulder    . Aortic valve replacement w/ pericardial tissue valve  10-15-09    Dr Barry Dieneswens  . Eye surgery     Family History  Problem Relation Age of Onset  . Heart attack Mother   . Cancer Father     lung and stomach  . Heart attack Father   . Cancer Sister     bone  . Diabetes Sister   . Heart murmur Brother   . Hypertension Brother   . Hyperlipidemia Brother   . Heart attack Brother     Social History   Social History  . Marital Status: Married    Spouse Name: N/A  . Number of Children: N/A  . Years of Education: N/A   Occupational History  . Retired    Social History Main Topics  . Smoking status: Former Smoker -- 1.50 packs/day  for 5 years    Types: Cigarettes, Cigars    Quit date: 09/08/1977  . Smokeless tobacco: Never Used  . Alcohol Use: No  . Drug Use: No  . Sexual Activity: Not on file   Other Topics Concern  . Not on file   Social History Narrative     Objective: BP 86/47 mmHg  Pulse 106  Wt 209 lb (94.802 kg)  General: Alert and Oriented, No Acute Distress HEENT: Pupils equal, round, reactive to light. Conjunctivae clear. Moist membranes Lungs: Work of breathing with trace diffuse inspiratory rales no rhonchi or wheezing. Cardiac: Regular rate and rhythm. Normal S1/S2.  No murmurs, rubs, nor gallops.   Extremities: No peripheral edema.  Strong peripheral pulses.  Mental Status: No depression, anxiety, nor agitation. Skin: Warm and dry.  Assessment & Plan: [name] was seen today for hemoglobin check.  Diagnoses and all orders for this visit:  Other iron deficiency anemias -     POCT hemoglobin   Anemia: Uncontrolled chronic condition, his fingerstick hemoglobin today suggests that his oral iron supplementation is not helping his anemia. I discussed the possibility of doing an iron infusion both he and his wife are interested  in this and they would prefer to have it done at [facility] if possible. I will have my assistant get in touch with the short stay department to help arrange this infusion, family is aware that this may require 2 infusion. I told him to plan on following up 2-3 weeks after the last infusion.  25 minutes spent face-to-face during visit today of which at least 50% was counseling or coordinating care regarding: 1. Other iron deficiency anemias       No Follow-up on file.

## 2015-07-25 MED ORDER — SODIUM CHLORIDE 0.9 % IV SOLN: 1000.0000 mg | Freq: Once | INTRAVENOUS | Status: AC

## 2015-07-25 MED ORDER — SODIUM CHLORIDE 0.9 % IV SOLN: 25.0000 mg | Freq: Once | INTRAVENOUS | Status: AC

## 2015-07-25 MED ORDER — IRON DEXTRAN 50 MG/ML IJ SOLN: INTRAMUSCULAR | Status: DC

## 2015-07-25 NOTE — Addendum Note (Signed)
Addended by: Laren BoomHOMMEL, Evani Shrider on: 07/25/2015 07:47 AM   Modules accepted: Orders

## 2015-07-25 NOTE — Telephone Encounter (Signed)
Short stay center will fax over form for Dr. Ivan AnchorsHommel to fill out

## 2015-07-25 NOTE — Telephone Encounter (Signed)
Will you please let short stay know that I've attempted to put this order into Epic. 1.) Can a nurse confirm that the see the correct order? 2.) Can a nurse let us know how to schedule this infusion for [name]?

## 2015-08-04 ENCOUNTER — Ambulatory Visit (INDEPENDENT_AMBULATORY_CARE_PROVIDER_SITE_OTHER): Payer: Medicare Other | Admitting: Family Medicine

## 2015-08-04 VITALS — BP 138/71 | HR 102 | Wt 211.0 lb

## 2015-08-04 DIAGNOSIS — N390 Urinary tract infection, site not specified: Secondary | ICD-10-CM | POA: Diagnosis not present

## 2015-08-04 LAB — POCT URINALYSIS DIPSTICK
BILIRUBIN UA: NEGATIVE
GLUCOSE UA: NEGATIVE
Ketones, UA: NEGATIVE
NITRITE UA: NEGATIVE
PH UA: 6.5
Protein, UA: 30
SPEC GRAV UA: 1.02
UROBILINOGEN UA: 1

## 2015-08-04 MED ORDER — SULFAMETHOXAZOLE-TRIMETHOPRIM 800-160 MG PO TABS: 1.0000 | ORAL_TABLET | Freq: Two times a day (BID) | ORAL | Status: DC

## 2015-08-05 ENCOUNTER — Encounter: Payer: Self-pay | Admitting: Adult Health

## 2015-08-05 ENCOUNTER — Encounter: Payer: Self-pay | Admitting: Family Medicine

## 2015-08-05 ENCOUNTER — Ambulatory Visit (INDEPENDENT_AMBULATORY_CARE_PROVIDER_SITE_OTHER): Payer: Medicare Other | Admitting: Adult Health

## 2015-08-05 VITALS — BP 106/68 | HR 109 | Temp 98.4°F | Ht 73.0 in | Wt 210.0 lb

## 2015-08-05 DIAGNOSIS — J84112 Idiopathic pulmonary fibrosis: Secondary | ICD-10-CM | POA: Diagnosis not present

## 2015-08-05 NOTE — Progress Notes (Signed)
CC: [name] is a 81 y.o. male is here for Urinary Tract Infection   Subjective: HPI:  For the past 2 days he's noticed some cloudiness to his urine along with burning in the shaft of his penis upon urination. He tells me this feels like the symptoms he experienced prior to needing to be admitted for urosepsis 2 years ago. He denies any fever, chills, abdominal pain, testicle pain or penile discharge. He denies any scrotal pain. He denies any confusion, chest pain, or rapid heartbeat. No interventions as of yet. Symptoms are not getting better or worse since onset   Review Of Systems Outlined In HPI  Past Medical History  Diagnosis Date  . Hypertension   . Hyperlipidemia   . Aortic stenosis     AVR 2011  . Dermatitis     poison ivy  . Cleft palate   . Interstitial lung disease (HCC)   . Pulmonary fibrosis (HCC)   . Syncope and collapse 07/18/2014  . GERD (gastroesophageal reflux disease)   . AAA (abdominal aortic aneurysm) [facility])     Past Surgical History  Procedure Laterality Date  . Bilat cataract sx  2004  . Cholecystectomy  1999  . 7 surgeries for cleft palate  1938  . Oral surgery with bone graft from hip for cleft palate  1989  . Right groin hernia repair  1950's  . Rotator cuff tear, left shoulder    . Aortic valve replacement w/ pericardial tissue valve  10-15-09    Dr Barry Dienes  . Eye surgery     Family History  Problem Relation Age of Onset  . Heart attack Mother   . Cancer Father     lung and stomach  . Heart attack Father   . Cancer Sister     bone  . Diabetes Sister   . Heart murmur Brother   . Hypertension Brother   . Hyperlipidemia Brother   . Heart attack Brother     Social History   Social History  . Marital Status: Married    Spouse Name: N/A  . Number of Children: N/A  . Years of Education: N/A   Occupational History  . Retired    Social History Main Topics  . Smoking status: Former Smoker -- 1.50 packs/day for 5 years    Types:  Cigarettes, Cigars    Quit date: 09/08/1977  . Smokeless tobacco: Never Used  . Alcohol Use: No  . Drug Use: No  . Sexual Activity: Not on file   Other Topics Concern  . Not on file   Social History Narrative     Objective: BP 138/71 mmHg  Pulse 102  Wt 211 lb (95.709 kg)  Vital signs reviewed. General: Alert and Oriented, No Acute Distress HEENT: Pupils equal, round, reactive to light. Conjunctivae clear.  External ears unremarkable.  Moist mucous membranes. Lungs: Clear and comfortable work of breathing, speaking in full sentences without accessory muscle use. Cardiac: Regular rate and rhythm.  Neuro: CN II-XII grossly intact, gait normal. Extremities: No peripheral edema.  Strong peripheral pulses.  Mental Status: No depression, anxiety, nor agitation. Logical though process. Skin: Warm and dry.  Assessment & Plan: [name] was seen today for urinary tract infection.  Diagnoses and all orders for this visit:  UTI (lower urinary tract infection) -     sulfamethoxazole-trimethoprim (BACTRIM DS,SEPTRA DS) 800-160 MG tablet; Take 1 tablet by mouth 2 (two) times daily. -     POCT Urinalysis Dipstick -  Urine culture   Urinalysis is suspicious for UTI therefore started on Bactrim pending culture.Signs and symptoms requring emergent/urgent reevaluation were discussed with the patient.  No Follow-up on file.

## 2015-08-05 NOTE — Assessment & Plan Note (Signed)
Recent flare now resolved and back to baseline   Plan  Continue on  Prednisone 10mg  daily and hold at this dose.  Continue on 6l/m of oxygen .  Low salt diet.  Follow up with Dr. Marchelle Gearingamaswamy in 2-3 months and As needed   Please contact office for sooner follow up if symptoms do not improve or worsen or seek emergency care

## 2015-08-05 NOTE — Progress Notes (Signed)
Subjective:    Patient ID: [name], male    DOB: Nov 15, 1934, 81 y.o.   MRN: [id number]  HPI  81 y.o.  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 W/ chronic resp failure on O2. And chronic steroids  Previously intolerant to [place].  Hx of severe AS s/p AVR , CAD  Event /TEST  Pulmonary rehab completed 10/2012  08/05/2015 Follow up : IPF  Pt returns for 6 week  follow up .  Last ov with IPF flare with mild volume overload CXR showed some progression of IPF changes.  Prednisone was increased for 2 weeks to 20mg  . Now back to 10mg  daily.  He was given lasix x 2 days.  He is feeling better. O2 needs have decreased to 5-6 l/m .  he denies any fever or discolored mucus, chest pain, abdominal pain, nausea, vomiting or diarrhea.    Last 2-D echo was in January 2017 EF was 50-55%, prosthetic aortic valve was present and functioning normally.. Pulmonary artery pressure 32. Moderate pulmonary valve regurgitation. Patient had pulmonary function test 05/2015  showed a decrease in lung function since 2016 FVC 28%, FEV1 37%, ratio 93, no significant bronchodilator response, DLCO 23%. Pulmonary function test in November 2016 showed a DLCO at 35%.    Past Medical History  Diagnosis Date  . Hypertension   . Hyperlipidemia   . Aortic stenosis     AVR 2011  . Dermatitis     poison ivy  . Cleft palate   . Interstitial lung disease (HCC)   . Pulmonary fibrosis (HCC)   . Syncope and collapse 07/18/2014  . GERD (gastroesophageal reflux disease)   . AAA (abdominal aortic aneurysm) [facility](HCC)    Current Outpatient Prescriptions on File Prior to Visit  Medication Sig Dispense Refill  . AMBULATORY NON FORMULARY MEDICATION Rollator rolling walker with seat.  Diagnosis Physical Deconditioning 799.3 1 Units 0  . aspirin 81 MG tablet Take 81 mg by mouth every morning.     . ferrous sulfate 325 (65 FE) MG tablet Take 1 tablet (325  mg total) by mouth 2 (two) times daily with a meal. 90 tablet 0  . finasteride (PROSCAR) 5 MG tablet Take 1 tablet by mouth  daily 90 tablet 1  . furosemide (LASIX) 20 MG tablet Take 1 tablet by mouth  daily 90 tablet 1  . iron dextran complex in sodium chloride 0.9 % 500 mL Single dose of 1000 mg diluted in 250 mL normal saline) given over one hour IV preceded by a 25mg  test dose. 1025 mg 0  . ketoconazole (NIZORAL) 2 % cream Apply 1 application topically 2 (two) times daily. To affected areas. 60 g 3  . loratadine (CLARITIN) 10 MG tablet Take 10 mg by mouth every morning.     . midodrine (PROAMATINE) 2.5 MG tablet TAKE 1 TABLET BY MOUTH  TWICE DAILY WITH A MEAL 180 tablet 0  . Multiple Vitamin (MULTIVITAMIN) tablet Take 1 tablet by mouth daily.    . NON FORMULARY Place 4 L into the nose daily. 6 liters and with activity 8 liters    . pantoprazole (PROTONIX) 40 MG tablet TAKE 1 TABLET BY MOUTH TWICE DAILY BEFORE A MEAL 180 tablet 0  . pravastatin (PRAVACHOL) 40 MG tablet Take 1 tablet by mouth at  bedtime 90 tablet 3  . predniSONE (DELTASONE) 10 MG tablet Take 1 tablet by mouth  daily with breakfast 90 tablet 0  .  sulfamethoxazole-trimethoprim (BACTRIM DS,SEPTRA DS) 800-160 MG tablet Take 1 tablet by mouth 2 (two) times daily. 28 tablet 0  . triamcinolone (NASACORT ALLERGY 24HR) 55 MCG/ACT AERO nasal inhaler Place 2 sprays into the nose daily. (Patient taking differently: Place 2 sprays into the nose daily as needed. ) 1 Inhaler 12  . HYDROcodone-acetaminophen (NORCO/VICODIN) 5-325 MG tablet Take 0.5 tablets by mouth every 8 (eight) hours as needed for moderate pain. (Patient not taking: Reported on 08/05/2015) 30 tablet 0  . [DISCONTINUED] tadalafil (CIALIS) 20 MG tablet Take 20 mg by mouth daily as needed. Take one about 1-2 hours before sexual intercourse      Current Facility-Administered Medications on File Prior to Visit  Medication Dose Route Frequency Provider Last Rate Last Dose  . iron  dextran complex (INFED) 25 mg in sodium chloride 0.9 % 500 mL IVPB  25 mg Intravenous Once [employer], DO       Followed by  . iron dextran complex (INFED) 1,000 mg in sodium chloride 0.9 % 500 mL IVPB  1,000 mg Intravenous Once Laren Boom, DO          Review of Systems  Constitutional:   No  weight loss, night sweats,  Fevers, chills, +fatigue, or  lassitude.  HEENT:   No headaches,  Difficulty swallowing,  Tooth/dental problems, or  Sore throat,                No sneezing, itching, ear ache, nasal congestion, post nasal drip,   CV:  No chest pain,  Orthopnea, PND, swelling in lower extremities, anasarca, dizziness, palpitations, syncope.   GI  No heartburn, indigestion, abdominal pain, nausea, vomiting, diarrhea, change in bowel habits, loss of appetite, bloody stools.   Resp:  .  No chest wall deformity  Skin: no rash or lesions.  GU: no dysuria, change in color of urine, no urgency or frequency.  No flank pain, no hematuria   MS:  No joint pain or swelling.  No decreased range of motion.  No back pain.  Psych:  No change in mood or affect. No depression or anxiety.  No memory loss.         Objective:   Physical Exam  Filed Vitals:   08/05/15 1627  BP: 106/68  Pulse: 109  Temp: 98.4 F (36.9 C)  TempSrc: Oral  Height:  (1.854 m)  Weight: 210 lb (95.255 kg)  SpO2: 95%   GEN: A/Ox3; pleasant , NAD, chronically ill appearing in wc on O2 (POC at 5l/m )  HEENT:  [place]/AT,  EACs-clear, TMs-wnl, NOSE-clear, THROAT-clear, no lesions, no postnasal drip or exudate noted.   NECK:  Supple w/ fair ROM; no JVD; normal carotid impulses w/o bruits; no thyromegaly or nodules palpated; no lymphadenopathy.  RESP  BB  Crackles , no accessory muscle use, no dullness to percussion  CARD:  RRR, no m/r/g  , tr  peripheral edema/support stockings , pulses intact, no cyanosis or clubbing.  GI:   Soft & nt; nml bowel sounds; no organomegaly or masses detected.  Musco: Warm  bil, no deformities or joint swelling noted.   Neuro: alert, no focal deficits noted.    Skin: Warm, no lesions or rashes  Ramaj Frangos NP-C  [facility]  08/05/2015       Assessment & Plan:

## 2015-08-05 NOTE — Patient Instructions (Signed)
Continue on  Prednisone 10mg  daily and hold at this dose.  Continue on 6l/m of oxygen .  Low salt diet.  Follow up with Dr. Marchelle Gearingamaswamy in 2-3 months and As needed   Please contact office for sooner follow up if symptoms do not improve or worsen or seek emergency care

## 2015-08-06 ENCOUNTER — Other Ambulatory Visit (HOSPITAL_COMMUNITY): Payer: Self-pay | Admitting: *Deleted

## 2015-08-06 LAB — URINE CULTURE

## 2015-08-07 ENCOUNTER — Ambulatory Visit (HOSPITAL_COMMUNITY)
Admission: RE | Admit: 2015-08-07 | Discharge: 2015-08-07 | Disposition: A | Discharge status: Home / Self Care | Payer: Medicare Other | Source: Ambulatory Visit | Attending: Family Medicine | Admitting: Family Medicine

## 2015-08-07 DIAGNOSIS — D509 Iron deficiency anemia, unspecified: Secondary | ICD-10-CM | POA: Diagnosis not present

## 2015-08-07 MED ORDER — SODIUM CHLORIDE 0.9 % IV SOLN
25.0000 mg | Freq: Once | INTRAVENOUS | Status: AC
  Administered 2015-08-07: 25 mg via INTRAVENOUS
  Filled 2015-08-07: qty 0.5

## 2015-08-07 MED ORDER — SODIUM CHLORIDE 0.9 % IV SOLN
1000.0000 mg | Freq: Once | INTRAVENOUS | Status: AC
  Administered 2015-08-07: 1000 mg via INTRAVENOUS
  Filled 2015-08-07: qty 20

## 2015-09-09 ENCOUNTER — Encounter: Payer: Self-pay | Admitting: Family Medicine

## 2015-09-09 ENCOUNTER — Ambulatory Visit (INDEPENDENT_AMBULATORY_CARE_PROVIDER_SITE_OTHER): Payer: Medicare Other | Admitting: Family Medicine

## 2015-09-09 VITALS — BP 105/62 | HR 81 | Wt 214.0 lb

## 2015-09-09 DIAGNOSIS — R8290 Unspecified abnormal findings in urine: Secondary | ICD-10-CM

## 2015-09-09 DIAGNOSIS — D509 Iron deficiency anemia, unspecified: Secondary | ICD-10-CM

## 2015-09-09 DIAGNOSIS — R3915 Urgency of urination: Secondary | ICD-10-CM

## 2015-09-09 DIAGNOSIS — R8299 Other abnormal findings in urine: Secondary | ICD-10-CM

## 2015-09-09 LAB — CBC
HEMATOCRIT: 29.9 % — AB (ref 38.5–50.0)
HEMOGLOBIN: 9.8 g/dL — AB (ref 13.2–17.1)
MCH: 26.2 pg — AB (ref 27.0–33.0)
MCHC: 32.8 g/dL (ref 32.0–36.0)
MCV: 79.9 fL — AB (ref 80.0–100.0)
Platelets: 173 10*3/uL (ref 140–400)
RBC: 3.74 MIL/uL — ABNORMAL LOW (ref 4.20–5.80)
RDW: 17.1 % — ABNORMAL HIGH (ref 11.0–15.0)
WBC: 17 10*3/uL — ABNORMAL HIGH (ref 3.8–10.8)

## 2015-09-09 LAB — POCT URINALYSIS DIPSTICK
BILIRUBIN UA: NEGATIVE
GLUCOSE UA: NEGATIVE
KETONES UA: NEGATIVE
Nitrite, UA: NEGATIVE
Protein, UA: 30
SPEC GRAV UA: 1.02
Urobilinogen, UA: 1
pH, UA: 6

## 2015-09-09 MED ORDER — CIPROFLOXACIN HCL 500 MG PO TABS: 500.0000 mg | ORAL_TABLET | Freq: Two times a day (BID) | ORAL | 0 refills | Status: DC

## 2015-09-09 NOTE — Progress Notes (Signed)
CC: [name] is a 81 y.o. male is here for Urinary Urgency   Subjective: HPI:  Cloudy urine and urinary urgency for the past 2 days. Symptoms are slight worsening currently mild to moderate in severity. Nothing seems to make symptoms better or worse. He denies any dysuria. No interventions as of yet. He denies any flank pain, fever, chills, nausea or gastrointestinal complaints.  Follow-up anemia: Last month he got an iron infusion. He tells me that he is feeling pretty good lately other than the above complaints. He believes his shortness of breath at baseline and he denies any new weakness. Last week he found out that he was able to ambulate around the house without his walker. Denies any chest pain or irregular heartbeat.   Review Of Systems Outlined In HPI  Past Medical History:  Diagnosis Date  . AAA (abdominal aortic aneurysm) (HCC)   . Aortic stenosis    AVR 2011  . Cleft palate   . Dermatitis    poison ivy  . GERD (gastroesophageal reflux disease)   . Hyperlipidemia   . Hypertension   . Interstitial lung disease (HCC)   . Pulmonary fibrosis (HCC)   . Syncope and collapse 07/18/2014    Past Surgical History:  Procedure Laterality Date  . 7 surgeries for cleft palate  1938  . aortic valve replacement w/ pericardial tissue valve  10-15-09   Dr Barry Dieneswens  . bilat cataract sx  2004  . CHOLECYSTECTOMY  1999  . EYE SURGERY    . oral surgery with bone graft from hip for cleft palate  1989  . right groin hernia repair  1950's  . rotator cuff tear, left shoulder     Family History  Problem Relation Age of Onset  . Heart attack Mother   . Cancer Father     lung and stomach  . Heart attack Father   . Cancer Sister     bone  . Diabetes Sister   . Heart murmur Brother   . Hypertension Brother   . Hyperlipidemia Brother   . Heart attack Brother     Social History   Social History  . Marital status: Married    Spouse name: N/A  . Number of children: N/A  . Years  of education: N/A   Occupational History  . Retired Retired   Social History Main Topics  . Smoking status: Former Smoker    Packs/day: 1.50    Years: 5.00    Types: Cigarettes, Cigars    Quit date: 09/08/1977  . Smokeless tobacco: Never Used  . Alcohol use No  . Drug use: No  . Sexual activity: Not on file   Other Topics Concern  . Not on file   Social History Narrative  . No narrative on file     Objective: BP 105/62   Pulse 81   Wt 214 lb (97.1 kg)   BMI 28.23 kg/m   General: Alert and Oriented, No Acute Distress HEENT: Pupils equal, round, reactive to light. Conjunctivae clear. Moist mucous membranes Lungs: Work of breathing with mild rales in the lower lung fields, no rhonchi or wheezing Cardiac: Regular rate and rhythm. Normal S1/S2.  No murmurs, rubs, nor gallops.   Extremities: No peripheral edema.  Strong peripheral pulses.  Mental Status: No depression, anxiety, nor agitation. Skin: Warm and dry.  Assessment & Plan: [name] was seen today for urinary urgency.  Diagnoses and all orders for this visit:  Cloudy urine -  POCT urinalysis dipstick -     Urine culture  Urinary urgency -     POCT urinalysis dipstick -     Urine culture  Anemia, iron deficiency -     CBC  Other orders -     ciprofloxacin (CIPRO) 500 MG tablet; Take 1 tablet (500 mg total) by mouth 2 (two) times daily.  Urinalysis is suspicious for UTI, starting ciprofloxacin pending culture results Anemia: Due for repeat CBC following iron infusion last month  Discussed with this patient that I will be resigning from my position here with [employer] in September in order to stay with my [place]. I let him know about the providers that are still accepting patients and I feel that this individual will be under great care if he/she stays here with [facility].   Return if symptoms worsen or fail to improve.

## 2015-09-13 LAB — URINE CULTURE

## 2015-09-19 ENCOUNTER — Encounter: Payer: Self-pay | Admitting: Family

## 2015-09-23 ENCOUNTER — Ambulatory Visit (INDEPENDENT_AMBULATORY_CARE_PROVIDER_SITE_OTHER): Payer: Medicare Other | Admitting: Family

## 2015-09-23 ENCOUNTER — Telehealth: Payer: Self-pay | Admitting: Cardiology

## 2015-09-23 ENCOUNTER — Ambulatory Visit (HOSPITAL_COMMUNITY)
Admission: RE | Admit: 2015-09-23 | Discharge: 2015-09-23 | Disposition: A | Discharge status: Home / Self Care | Payer: Medicare Other | Source: Ambulatory Visit | Attending: Family | Admitting: Family

## 2015-09-23 ENCOUNTER — Ambulatory Visit (INDEPENDENT_AMBULATORY_CARE_PROVIDER_SITE_OTHER)
Admission: RE | Admit: 2015-09-23 | Discharge: 2015-09-23 | Disposition: A | Discharge status: Home / Self Care | Payer: Medicare Other | Source: Ambulatory Visit | Attending: Family | Admitting: Family

## 2015-09-23 ENCOUNTER — Encounter: Payer: Self-pay | Admitting: Family Medicine

## 2015-09-23 ENCOUNTER — Other Ambulatory Visit: Payer: Self-pay | Admitting: Family

## 2015-09-23 ENCOUNTER — Encounter: Payer: Self-pay | Admitting: Family

## 2015-09-23 VITALS — BP 126/79 | HR 100 | Temp 97.0°F | Resp 18 | Ht 73.0 in | Wt 207.9 lb

## 2015-09-23 DIAGNOSIS — Z954 Presence of other heart-valve replacement: Secondary | ICD-10-CM | POA: Diagnosis not present

## 2015-09-23 DIAGNOSIS — I714 Abdominal aortic aneurysm, without rupture, unspecified: Secondary | ICD-10-CM

## 2015-09-23 DIAGNOSIS — J8410 Pulmonary fibrosis, unspecified: Secondary | ICD-10-CM | POA: Diagnosis not present

## 2015-09-23 DIAGNOSIS — R0989 Other specified symptoms and signs involving the circulatory and respiratory systems: Secondary | ICD-10-CM | POA: Diagnosis not present

## 2015-09-23 DIAGNOSIS — I350 Nonrheumatic aortic (valve) stenosis: Secondary | ICD-10-CM

## 2015-09-23 DIAGNOSIS — I739 Peripheral vascular disease, unspecified: Secondary | ICD-10-CM

## 2015-09-23 DIAGNOSIS — I6523 Occlusion and stenosis of bilateral carotid arteries: Secondary | ICD-10-CM | POA: Insufficient documentation

## 2015-09-23 DIAGNOSIS — I499 Cardiac arrhythmia, unspecified: Secondary | ICD-10-CM

## 2015-09-23 DIAGNOSIS — Z952 Presence of prosthetic heart valve: Secondary | ICD-10-CM

## 2015-09-23 LAB — VAS US CAROTID
LCCADSYS: 40 cm/s
LCCAPDIAS: 42 cm/s
LCCAPSYS: 149 cm/s
LEFT ECA DIAS: -27 cm/s
LEFT VERTEBRAL DIAS: -14 cm/s
Left ICA dist dias: -37 cm/s
Left ICA dist sys: -115 cm/s
Left ICA prox dias: 47 cm/s
Left ICA prox sys: 158 cm/s
RCCAPDIAS: 31 cm/s
RCCAPSYS: 104 cm/s
RIGHT CCA MID DIAS: 24 cm/s
RIGHT ECA DIAS: -34 cm/s
RIGHT VERTEBRAL DIAS: -15 cm/s
Right cca dist sys: -95 cm/s

## 2015-09-23 NOTE — Progress Notes (Signed)
[facility] [place] HISTORY AND PHYSICAL   MRN : [id number]  History of Present Illness:   [name] is a 81 y.o. male  patient of Dr. Arbie CookeyEarly with multiple medical problems. He had a recent CT scan to evaluate possible bladder mass. He did have a large prostate up to 7 cm. Also had an incidental finding of a 3 cm infrarenal abdominal aortic aneurysm.  He returns today for follow up of his small AAA. He denies any known family history of AAA. He has a history of aortic valve replacement in 2011. He is on chronic oxygen therapy for severe pulmonary fibrosis. He had no back pain until he went bowling recently and slightly strained his back, denies abdominal pain. Pt denies any history of stroke or TIA. He stopped smoking in the 1970's. He denies claudication symptoms with walking.  Pt Diabetic: No Pt smoker: former smoker, quit in the 1970's  Pt meds include: Statin :Yes Betablocker: No ASA: Yes Other anticoagulants/antiplatelets: no  Current Outpatient Prescriptions  Medication Sig Dispense Refill  . AMBULATORY NON FORMULARY MEDICATION Rollator rolling walker with seat.  Diagnosis Physical Deconditioning 799.3 1 Units 0  . aspirin 81 MG tablet Take 81 mg by mouth every morning.     . finasteride (PROSCAR) 5 MG tablet Take 1 tablet by mouth  daily 90 tablet 1  . furosemide (LASIX) 20 MG tablet Take 1 tablet by mouth  daily 90 tablet 1  . HYDROcodone-acetaminophen (NORCO/VICODIN) 5-325 MG tablet Take 0.5 tablets by mouth every 8 (eight) hours as needed for moderate pain. 30 tablet 0  . iron dextran complex in sodium chloride 0.9 % 500 mL Single dose of 1000 mg diluted in 250 mL normal saline) given over one hour IV preceded by a 25mg  test dose. 1025 mg 0  . ketoconazole (NIZORAL) 2 % cream Apply 1 application topically 2 (two) times daily. To affected areas. 60 g 3  . loratadine (CLARITIN) 10 MG tablet Take 10 mg by mouth every morning.     . midodrine  (PROAMATINE) 2.5 MG tablet TAKE 1 TABLET BY MOUTH  TWICE DAILY WITH A MEAL 180 tablet 0  . Multiple Vitamin (MULTIVITAMIN) tablet Take 1 tablet by mouth daily.    . NON FORMULARY Place 4 L into the nose daily. 6 liters and with activity 8 liters    . pantoprazole (PROTONIX) 40 MG tablet TAKE 1 TABLET BY MOUTH TWICE DAILY BEFORE A MEAL 180 tablet 0  . pravastatin (PRAVACHOL) 40 MG tablet Take 1 tablet by mouth at  bedtime 90 tablet 3  . predniSONE (DELTASONE) 10 MG tablet Take 1 tablet by mouth  daily with breakfast 90 tablet 0  . triamcinolone (NASACORT ALLERGY 24HR) 55 MCG/ACT AERO nasal inhaler Place 2 sprays into the nose daily. (Patient taking differently: Place 2 sprays into the nose daily as needed. ) 1 Inhaler 12   No current facility-administered medications for this visit.    Facility-Administered Medications Ordered in Other Visits  Medication Dose Route Frequency Provider Last Rate Last Dose  . iron dextran complex (INFED) 25 mg in sodium chloride 0.9 % 500 mL IVPB  25 mg Intravenous Once Sean Hommel, DO       Followed by  . iron dextran complex (INFED) 1,000 mg in sodium chloride 0.9 % 500 mL IVPB  1,000 mg Intravenous Once Laren BoomSean Hommel, DO        Past Medical History:  Diagnosis Date  . AAA (abdominal aortic  aneurysm) (HCC)   . Aortic stenosis    AVR 2011  . Cleft palate   . Dermatitis    poison ivy  . GERD (gastroesophageal reflux disease)   . Hyperlipidemia   . Hypertension   . Interstitial lung disease (HCC)   . Pulmonary fibrosis (HCC)   . Syncope and collapse 07/18/2014    Social History Social History  Substance Use Topics  . Smoking status: Former Smoker    Packs/day: 1.50    Years: 5.00    Types: Cigarettes, Cigars    Quit date: 09/08/1977  . Smokeless tobacco: Never Used  . Alcohol use No    Family History Family History  Problem Relation Age of Onset  . Heart attack Mother   . Cancer Father     lung and stomach  . Heart attack Father   . Cancer  Sister     bone  . Diabetes Sister   . Heart murmur Brother   . Hypertension Brother   . Hyperlipidemia Brother   . Heart attack Brother     Surgical History Past Surgical History:  Procedure Laterality Date  . 7 surgeries for cleft palate  1938  . aortic valve replacement w/ pericardial tissue valve  10-15-09   Dr Barry Dienes  . bilat cataract sx  2004  . CHOLECYSTECTOMY  1999  . EYE SURGERY    . oral surgery with bone graft from hip for cleft palate  1989  . right groin hernia repair  1950's  . rotator cuff tear, left shoulder      Allergies  Allergen Reactions  . Pravastatin Sodium Other (See Comments)    Sneezing episodes, can take medication and tolerates reaction.  . Pirfenidone Nausea And Vomiting    Current Outpatient Prescriptions  Medication Sig Dispense Refill  . AMBULATORY NON FORMULARY MEDICATION Rollator rolling walker with seat.  Diagnosis Physical Deconditioning 799.3 1 Units 0  . aspirin 81 MG tablet Take 81 mg by mouth every morning.     . finasteride (PROSCAR) 5 MG tablet Take 1 tablet by mouth  daily 90 tablet 1  . furosemide (LASIX) 20 MG tablet Take 1 tablet by mouth  daily 90 tablet 1  . HYDROcodone-acetaminophen (NORCO/VICODIN) 5-325 MG tablet Take 0.5 tablets by mouth every 8 (eight) hours as needed for moderate pain. 30 tablet 0  . iron dextran complex in sodium chloride 0.9 % 500 mL Single dose of 1000 mg diluted in 250 mL normal saline) given over one hour IV preceded by a 25mg  test dose. 1025 mg 0  . ketoconazole (NIZORAL) 2 % cream Apply 1 application topically 2 (two) times daily. To affected areas. 60 g 3  . loratadine (CLARITIN) 10 MG tablet Take 10 mg by mouth every morning.     . midodrine (PROAMATINE) 2.5 MG tablet TAKE 1 TABLET BY MOUTH  TWICE DAILY WITH A MEAL 180 tablet 0  . Multiple Vitamin (MULTIVITAMIN) tablet Take 1 tablet by mouth daily.    . NON FORMULARY Place 4 L into the nose daily. 6 liters and with activity 8 liters    .  pantoprazole (PROTONIX) 40 MG tablet TAKE 1 TABLET BY MOUTH TWICE DAILY BEFORE A MEAL 180 tablet 0  . pravastatin (PRAVACHOL) 40 MG tablet Take 1 tablet by mouth at  bedtime 90 tablet 3  . predniSONE (DELTASONE) 10 MG tablet Take 1 tablet by mouth  daily with breakfast 90 tablet 0  . triamcinolone (NASACORT ALLERGY 24HR) 55 MCG/ACT AERO nasal  inhaler Place 2 sprays into the nose daily. (Patient taking differently: Place 2 sprays into the nose daily as needed. ) 1 Inhaler 12   No current facility-administered medications for this visit.    Facility-Administered Medications Ordered in Other Visits  Medication Dose Route Frequency Provider Last Rate Last Dose  . iron dextran complex (INFED) 25 mg in sodium chloride 0.9 % 500 mL IVPB  25 mg Intravenous Once Sean Hommel, DO       Followed by  . iron dextran complex (INFED) 1,000 mg in sodium chloride 0.9 % 500 mL IVPB  1,000 mg Intravenous Once Laren Boom, DO         REVIEW OF SYSTEMS: See HPI for pertinent positives and negatives.  Physical Examination Vitals:   09/23/15 0923  BP: 126/79  Pulse: 100  Resp: 18  Temp: 97 F (36.1 C)  SpO2: 100%  Weight: 207 lb 14.4 oz (94.3 kg)  Height: 6\' 1"  (1.854 m)   Body mass index is 27.43 kg/m.  General: A&O x 3, WD.  Pulmonary: Sym exp, limited air movt, CTAB, + rales in left base, no rhonchi, or wheezing. Using supplemental oxygen via concentrator at 1 L/min/ [place]  Cardiac: Irregular rhythm, controlled rate, no detected murmur.   Carotid Bruits Right Left   Positive Negative   Aorta is not palpable Radial pulses are 2+ palpable and =                          VASCULAR EXAM:                                                                                                                                                                                   LE Pulses Right Left       FEMORAL  2+ palpable  2+ palpable       POPLITEAL  not palpable  not palpable       POSTERIOR TIBIAL   not palpable  not palpable       DORSALIS PEDIS      ANTERIOR TIBIAL 2+ palpable 2+ palpable     Gastrointestinal: soft, NTND, -G/R, - HSM, - masses palpated, - CVAT B.  Musculoskeletal: M/S 5/5 in legs, 4/5 in upper extremities, Extremities without ischemic changes.  Neurologic: CN 2-12 intact, Pain and light touch intact in extremities are intact, Motor exam as listed above.    Non-Invasive Vascular Imaging:   ASSESSMENT:  [name] is a 81 y.o. male who presents with asymptomatic small AAA with no increase in size. Mildly aneurysmal left CIA.  Right carotid bruit present, pt has no history of stroke  or TIA.  DATA Carotid duplex suggests 60-79% right ICA stenosis and 40-59% left ICA stenosis. Right ECA stenosis. No prior carotid duplex for comparison.   AAA duplex suggests 3.0 cm x 2.8 cm distal AAA. Right CIA measures 1.3 x 1.2 cm. Left CIA measures 1.5 x 1.5 cm. No significant change compared to exam on 09/17/14.   Irregular cardiac rhythm detected, pt and wife state he has no hx of irregular heart rhythm. He denies feeling any more dyspneic than usual, denies chest pain, denies feeling light headed. I advised him or his wife to call Dr. Ludwig Clarks office today and let them know of this. He sees Dr. Jens Som yearly for follow up after aortic valve replacement.    PLAN:   Based on today's exam and non-invasive vascular lab results, the patient will follow up in 6 months with the following tests: carotid duplex; AAA duplex in a year. I discussed in depth with the patient the nature of atherosclerosis, and emphasized the importance of maximal medical management including strict control of blood pressure, blood glucose, and lipid levels, obtaining regular exercise, and cessation of smoking.  The patient is aware that without maximal medical management the underlying atherosclerotic disease process will progress, limiting the benefit of any  interventions. Consideration for repair of AAA would be made when the size approaches 4.8 or 5.0 cm, growth > 1 cm/yr, and symptomatic status. The patient was given information about stroke prevention and what symptoms should prompt the patient to seek immediate medical care. The patient was given information about AAA including signs, symptoms, treatment,  what symptoms should prompt the patient to seek immediate medical care, and how to minimize the risk of enlargement and rupture of aneurysms.  Thank you for allowing [place] to participate in this patient's care.  Charisse March, RN, MSN, FNP-C [facility] Office: [phone]  Clinic MD: Early 09/23/2015 9:46 AM

## 2015-09-23 NOTE — Patient Instructions (Addendum)
Stroke Prevention Some medical conditions and behaviors are associated with an increased chance of having a stroke. You may prevent a stroke by making healthy choices and managing medical conditions. HOW CAN I REDUCE MY RISK OF HAVING A STROKE?   Stay physically active. Get at least 30 minutes of activity on most or all days.  Do not smoke. It may also be helpful to avoid exposure to secondhand smoke.  Limit alcohol use. Moderate alcohol use is considered to be:  No more than 2 drinks per day for men.  No more than 1 drink per day for nonpregnant women.  Eat healthy foods. This involves:  Eating 5 or more servings of fruits and vegetables a day.  Making dietary changes that address high blood pressure (hypertension), high cholesterol, diabetes, or obesity.  Manage your cholesterol levels.  Making food choices that are high in fiber and low in saturated fat, trans fat, and cholesterol may control cholesterol levels.  Take any prescribed medicines to control cholesterol as directed by your health care provider.  Manage your diabetes.  Controlling your carbohydrate and sugar intake is recommended to manage diabetes.  Take any prescribed medicines to control diabetes as directed by your health care provider.  Control your hypertension.  Making food choices that are low in salt (sodium), saturated fat, trans fat, and cholesterol is recommended to manage hypertension.  Ask your health care provider if you need treatment to lower your blood pressure. Take any prescribed medicines to control hypertension as directed by your health care provider.  If you are 18-39 years of age, have your blood pressure checked every 3-5 years. If you are 40 years of age or older, have your blood pressure checked every year.  Maintain a healthy weight.  Reducing calorie intake and making food choices that are low in sodium, saturated fat, trans fat, and cholesterol are recommended to manage  weight.  Stop drug abuse.  Avoid taking birth control pills.  Talk to your health care provider about the risks of taking birth control pills if you are over 35 years old, smoke, get migraines, or have ever had a blood clot.  Get evaluated for sleep disorders (sleep apnea).  Talk to your health care provider about getting a sleep evaluation if you snore a lot or have excessive sleepiness.  Take medicines only as directed by your health care provider.  For some people, aspirin or blood thinners (anticoagulants) are helpful in reducing the risk of forming abnormal blood clots that can lead to stroke. If you have the irregular heart rhythm of atrial fibrillation, you should be on a blood thinner unless there is a good reason you cannot take them.  Understand all your medicine instructions.  Make sure that other conditions (such as anemia or atherosclerosis) are addressed. SEEK IMMEDIATE MEDICAL CARE IF:   You have sudden weakness or numbness of the face, arm, or leg, especially on one side of the body.  Your face or eyelid droops to one side.  You have sudden confusion.  You have trouble speaking (aphasia) or understanding.  You have sudden trouble seeing in one or both eyes.  You have sudden trouble walking.  You have dizziness.  You have a loss of balance or coordination.  You have a sudden, severe headache with no known cause.  You have new chest pain or an irregular heartbeat. Any of these symptoms may represent a serious problem that is an emergency. Do not wait to see if the symptoms will   go away. Get medical help at once. Call your local emergency services (911 in [place] Do not drive yourself to the hospital.   This information is not intended to replace advice given to you by your health care provider. Make sure you discuss any questions you have with your health care provider.   Document Released: 02/19/2004 Document Revised: 02/01/2014 Document Reviewed:  07/14/2012 Elsevier Interactive Patient Education 2016 Else[employer]     Abdominal Aortic Aneurysm An aneurysm is a weakened or damaged part of an artery wall that bulges from the normal force of blood pumping through the body. An abdominal aortic aneurysm is an aneurysm that occurs in the lower part of the aorta, the main artery of the body.  The major concern with an abdominal aortic aneurysm is that it can enlarge and burst (rupture) or blood can flow between the layers of the wall of the aorta through a tear (aorticdissection). Both of these conditions can cause bleeding inside the body and can be life threatening unless diagnosed and treated promptly. CAUSES  The exact cause of an abdominal aortic aneurysm is unknown. Some contributing factors are:   A hardening of the arteries caused by the buildup of fat and other substances in the lining of a blood vessel (arteriosclerosis).  Inflammation of the walls of an artery (arteritis).   Connective tissue diseases, such as Marfan syndrome.   Abdominal trauma.   An infection, such as syphilis or staphylococcus, in the wall of the aorta (infectious aortitis) caused by bacteria. RISK FACTORS  Risk factors that contribute to an abdominal aortic aneurysm may include:  Age older than 60 years.   High blood pressure (hypertension).  Male gender.  Ethnicity (white race).  Obesity.  Family history of aneurysm (first degree relatives only).  Tobacco use. PREVENTION  The following healthy lifestyle habits may help decrease your risk of abdominal aortic aneurysm:  Quitting smoking. Smoking can raise your blood pressure and cause arteriosclerosis.  Limiting or avoiding alcohol.  Keeping your blood pressure, blood sugar level, and cholesterol levels within normal limits.  Decreasing your salt intake. In somepeople, too much salt can raise blood pressure and increase your risk of abdominal aortic aneurysm.  Eating a diet low in  saturated fats and cholesterol.  Increasing your fiber intake by including whole grains, vegetables, and fruits in your diet. Eating these foods may help lower blood pressure.  Maintaining a healthy weight.  Staying physically active and exercising regularly. SYMPTOMS  The symptoms of abdominal aortic aneurysm may vary depending on the size and rate of growth of the aneurysm.Most grow slowly and do not have any symptoms. When symptoms do occur, they may include:  Pain (abdomen, side, lower back, or groin). The pain may vary in intensity. A sudden onset of severe pain may indicate that the aneurysm has ruptured.  Feeling full after eating only small amounts of food.  Nausea or vomiting or both.  Feeling a pulsating lump in the abdomen.  Feeling faint or passing out. DIAGNOSIS  Since most unruptured abdominal aortic aneurysms have no symptoms, they are often discovered during diagnostic exams for other conditions. An aneurysm may be found during the following procedures:  Ultrasonography (A one-time screening for abdominal aortic aneurysm by ultrasonography is also recommended for all men aged 65-75 years who have ever smoked).  X-ray exams.  A computed tomography (CT).  Magnetic resonance imaging (MRI).  Angiography or arteriography. TREATMENT  Treatment of an abdominal aortic aneurysm depends on the size of   your aneurysm, your age, and risk factors for rupture. Medication to control blood pressure and pain may be used to manage aneurysms smaller than 6 cm. Regular monitoring for enlargement may be recommended by your caregiver if:  The aneurysm is 3-4 cm in size (an annual ultrasonography may be recommended).  The aneurysm is 4-4.5 cm in size (an ultrasonography every 6 months may be recommended).  The aneurysm is larger than 4.5 cm in size (your caregiver may ask that you be examined by a vascular surgeon). If your aneurysm is larger than 6 cm, surgical repair may be  recommended. There are two main methods for repair of an aneurysm:   Endovascular repair (a minimally invasive surgery). This is done most often.  Open repair. This method is used if an endovascular repair is not possible.   This information is not intended to replace advice given to you by your health care provider. Make sure you discuss any questions you have with your health care provider.   Document Released: 10/21/2004 Document Revised: 05/08/2012 Document Reviewed: 02/11/2012 Elsevier Interactive Patient Education 2016 Elsevier Inc.  

## 2015-09-23 NOTE — Telephone Encounter (Signed)
Agree with PAOV Brian Crenshaw  

## 2015-09-23 NOTE — Telephone Encounter (Signed)
Spoke to wife. She notes patient seen by [facility] today and was noted to have an irregular rhythm.  A Fib was mentioned but no EKG performed today. They noted that patient has no history of A Fib or other arrythmia.  They were instructed to follow up with us on this.  Patient is asymptomatic at this time. No shortness of breath, rapid palpitations, chest pain, faintness. After discussion, wife aware I will call back tomorrow once I can get assistance of a scheduler so that we can arrange a PA visit for patient to be seen this week. Will route to Dr. Jens Somrenshaw for FYI/any further advice.  She also noted patient had carotid US which showed arteries to be 69-79% occluded on right side and 49% on left.

## 2015-09-23 NOTE — Telephone Encounter (Signed)
New message    Pt wife verbalized that she is calling for rn she said that he has a 69-79 blockage on the right and 49 on the left

## 2015-09-24 NOTE — Telephone Encounter (Signed)
APP visit arranged for Friday w/ Lawson FiscalLori. Called back and relayed appt information to caller. Understanding verbalized. Aware to call sooner if new concerns/questions.

## 2015-09-25 ENCOUNTER — Other Ambulatory Visit: Payer: Self-pay | Admitting: Family Medicine

## 2015-09-26 ENCOUNTER — Encounter: Payer: Self-pay | Admitting: Nurse Practitioner

## 2015-09-26 ENCOUNTER — Ambulatory Visit (INDEPENDENT_AMBULATORY_CARE_PROVIDER_SITE_OTHER): Payer: Medicare Other | Admitting: Nurse Practitioner

## 2015-09-26 VITALS — BP 122/80 | HR 92 | Ht 73.0 in | Wt 210.8 lb

## 2015-09-26 DIAGNOSIS — E785 Hyperlipidemia, unspecified: Secondary | ICD-10-CM | POA: Diagnosis not present

## 2015-09-26 DIAGNOSIS — I4891 Unspecified atrial fibrillation: Secondary | ICD-10-CM | POA: Diagnosis not present

## 2015-09-26 DIAGNOSIS — I259 Chronic ischemic heart disease, unspecified: Secondary | ICD-10-CM

## 2015-09-26 LAB — CBC
HCT: 30.6 % — ABNORMAL LOW (ref 38.5–50.0)
Hemoglobin: 9.9 g/dL — ABNORMAL LOW (ref 13.2–17.1)
MCH: 26.1 pg — ABNORMAL LOW (ref 27.0–33.0)
MCHC: 32.4 g/dL (ref 32.0–36.0)
MCV: 80.7 fL (ref 80.0–100.0)
Platelets: 135 10*3/uL — ABNORMAL LOW (ref 140–400)
RBC: 3.79 MIL/uL — ABNORMAL LOW (ref 4.20–5.80)
RDW: 16.5 % — ABNORMAL HIGH (ref 11.0–15.0)
WBC: 15.2 10*3/uL — ABNORMAL HIGH (ref 3.8–10.8)

## 2015-09-26 LAB — BASIC METABOLIC PANEL
BUN: 17 mg/dL (ref 7–25)
CO2: 34 mmol/L — ABNORMAL HIGH (ref 20–31)
Calcium: 9.1 mg/dL (ref 8.6–10.3)
Chloride: 99 mmol/L (ref 98–110)
Creat: 0.93 mg/dL (ref 0.70–1.18)
Glucose, Bld: 101 mg/dL — ABNORMAL HIGH (ref 65–99)
Potassium: 4.5 mmol/L (ref 3.5–5.3)
Sodium: 141 mmol/L (ref 135–146)

## 2015-09-26 LAB — TSH: TSH: 2.19 mIU/L (ref 0.40–4.50)

## 2015-09-26 MED ORDER — APIXABAN 2.5 MG PO TABS: 5.0000 mg | ORAL_TABLET | Freq: Two times a day (BID) | ORAL | Status: DC

## 2015-09-26 NOTE — Progress Notes (Signed)
CARDIOLOGY OFFICE NOTE  Date:  09/26/2015    [name] Date of Birth: July 13, 1934 Medical Record #[id number]  PCP:  [name], MD  Cardiologist:  Jens Som    Chief Complaint  Patient presents with  . Irregular Heart Beat    Work in visit - seen for Dr. Jens Som    History of Present Illness: [name] is a 81 y.o. male who presents today for a work in visit. Seen for Dr. Jens Som.   He was diagnosed with severe AS by echo. Cardiac catheterization in Sept 2011 revealed nonobstructive coronary disease, normal LV function and moderately severe aortic stenosis with a mean gradient of 34 mm of mercury. The patient then underwent aortic valve replacement on September 21 of 2011 with a pericardial tissue valve. A chest CT was performed in May of 2014 and suggested interstitial lung disease. The patient seen by Dr. Delford Field and diagnosed with BOOP. Last echocardiogram in May 2015 showed normal LV function, mild left atrial, right atrial and right ventricular enlargement and a bioprosthetic aortic valve with mean gradient 15 mmHg. His PAD is followed by [facility]. His other issues include chronic anemia, HTN, HLD and GERD.   He was last seen here in June of 2016 - was on home oxygen for his pulmonary fibrosis. Cardiac status was ok.   Comes in today. Here with his wife. Says never got a recall letter or phone call for follow up visit. Seen at [facility] earlier this week - noted irregular heart rhythm and was asked to see Dr. Jens Som for follow up. He feels like his breathing is at his baseline. Energy level seems ok. No palpitations whatsoever. Swelling managed with support stockings. He is anemic - not really clear why - had poor results with oral iron - just had an IV iron transfusion - wife felt like this really helped him. Does not see hematology. Not able to have colonoscopy - not felt to be safe to put to sleep. He typically will take 2 Midodrines per day due to his orthostasis.  His last fall was back in January - he broke his arm. He is using a walker. Typically on 4 to 6 liters of oxygen. Wife feels like his breathing has actually gotten a little better since the iron infusion.   Past Medical History:  Diagnosis Date  . AAA (abdominal aortic aneurysm) (HCC)   . Aortic stenosis    AVR 2011  . Cleft palate   . Dermatitis    poison ivy  . GERD (gastroesophageal reflux disease)   . Hyperlipidemia   . Hypertension   . Interstitial lung disease (HCC)   . Pulmonary fibrosis (HCC)   . Syncope and collapse 07/18/2014    Past Surgical History:  Procedure Laterality Date  . 7 surgeries for cleft palate  1938  . aortic valve replacement w/ pericardial tissue valve  10-15-09   Dr Barry Dienes  . bilat cataract sx  2004  . CHOLECYSTECTOMY  1999  . EYE SURGERY    . oral surgery with bone graft from hip for cleft palate  1989  . right groin hernia repair  1950's  . rotator cuff tear, left shoulder       Medications: Current Outpatient Prescriptions  Medication Sig Dispense Refill  . AMBULATORY NON FORMULARY MEDICATION Rollator rolling walker with seat.  Diagnosis Physical Deconditioning 799.3 1 Units 0  . aspirin 81 MG tablet Take 81 mg by mouth every morning.     Marland Kitchen  finasteride (PROSCAR) 5 MG tablet Take 1 tablet by mouth  daily 90 tablet 0  . furosemide (LASIX) 20 MG tablet Take 1 tablet by mouth  daily 90 tablet 1  . HYDROcodone-acetaminophen (NORCO/VICODIN) 5-325 MG tablet Take 0.5 tablets by mouth every 8 (eight) hours as needed for moderate pain. 30 tablet 0  . iron dextran complex in sodium chloride 0.9 % 500 mL Single dose of 1000 mg diluted in 250 mL normal saline) given over one hour IV preceded by a 25mg  test dose. 1025 mg 0  . ketoconazole (NIZORAL) 2 % cream Apply 1 application topically 2 (two) times daily. To affected areas. 60 g 3  . loratadine (CLARITIN) 10 MG tablet Take 10 mg by mouth every morning.     . midodrine (PROAMATINE) 2.5 MG tablet TAKE 1  TABLET BY MOUTH  TWICE DAILY WITH A MEAL 180 tablet 0  . Multiple Vitamin (MULTIVITAMIN) tablet Take 1 tablet by mouth daily.    . NON FORMULARY Place 4 L into the nose daily. 6 liters and with activity 8 liters    . pantoprazole (PROTONIX) 40 MG tablet TAKE 1 TABLET BY MOUTH TWICE DAILY BEFORE A MEAL 180 tablet 0  . pravastatin (PRAVACHOL) 40 MG tablet Take 1 tablet by mouth at  bedtime 90 tablet 3  . predniSONE (DELTASONE) 10 MG tablet Take 1 tablet by mouth  daily with breakfast 90 tablet 0  . triamcinolone (NASACORT ALLERGY 24HR) 55 MCG/ACT AERO nasal inhaler Place 2 sprays into the nose daily. (Patient taking differently: Place 2 sprays into the nose daily as needed. ) 1 Inhaler 12   Current Facility-Administered Medications  Medication Dose Route Frequency Provider Last Rate Last Dose  . apixaban (ELIQUIS) tablet 5 mg  5 mg Oral BID Rosalio MacadamiaLori C Cayde Held, NP       Facility-Administered Medications Ordered in Other Visits  Medication Dose Route Frequency Provider Last Rate Last Dose  . iron dextran complex (INFED) 25 mg in sodium chloride 0.9 % 500 mL IVPB  25 mg Intravenous Once Sean Hommel, DO       Followed by  . iron dextran complex (INFED) 1,000 mg in sodium chloride 0.9 % 500 mL IVPB  1,000 mg Intravenous Once Laren BoomSean Hommel, DO        Allergies: Allergies  Allergen Reactions  . Pravastatin Sodium Other (See Comments)    Sneezing episodes, can take medication and tolerates reaction.  . Pirfenidone Nausea And Vomiting    Social History: The patient  reports that he quit smoking about 38 years ago. His smoking use included Cigarettes and Cigars. He has a 7.50 pack-year smoking history. He has never used smokeless tobacco. He reports that he does not drink alcohol or use drugs.   Family History: The patient's family history includes Cancer in his father and sister; Diabetes in his sister; Heart attack in his brother, father, and mother; Heart murmur in his brother; Hyperlipidemia in his  brother; Hypertension in his brother.   Review of Systems: Please see the history of present illness.   Otherwise, the review of systems is positive for none.   All other systems are reviewed and negative.   Physical Exam: VS:  BP 122/80   Pulse 92   Ht 6\' 1"  (1.854 m)   Wt 210 lb 12.8 oz (95.6 kg)   BMI 27.81 kg/m  .  BMI Body mass index is 27.81 kg/m.  Wt Readings from Last 3 Encounters:  09/26/15 210 lb 12.8 oz (  95.6 kg)  09/23/15 207 lb 14.4 oz (94.3 kg)  09/09/15 214 lb (97.1 kg)    General: Pleasant. Elderly male who looks chronically ill but alert and in no acute distress. Has oxygen in place. Using a walker.    HEENT: Normal.  Neck: Supple, no JVD, carotid bruits, or masses noted.  Cardiac: Irregular irregular rhythm. Rate is fair. No edema.  Respiratory:  Lungs are clear to auscultation bilaterally with normal work of breathing.  GI: Soft and nontender.  MS: No deformity or atrophy. Gait and ROM intact.  Skin: Warm and dry. Color is normal.  Neuro:  Strength and sensation are intact and no gross focal deficits noted.  Psych: Alert, appropriate and with normal affect.   LABORATORY DATA:  EKG:  EKG is ordered today. This demonstrates new onset of atrial fib - rate is 92.  Lab Results  Component Value Date   WBC 17.0 (H) 09/09/2015   HGB 9.8 (L) 09/09/2015   HCT 29.9 (L) 09/09/2015   PLT 173 09/09/2015   GLUCOSE 118 (H) 06/27/2015   CHOL 172 12/05/2013   TRIG 152.0 (H) 12/05/2013   HDL 41.50 12/05/2013   LDLCALC 100 (H) 12/05/2013   ALT 11 12/26/2014   AST 16 12/26/2014   NA 140 06/27/2015   K 5.2 06/27/2015   CL 96 (L) 06/27/2015   CREATININE 0.92 06/27/2015   BUN 17 06/27/2015   CO2 33 (H) 06/27/2015   TSH 2.936 06/07/2012   PSA 0.67 08/14/2013   INR 1.01 12/26/2014   HGBA1C (H) 10/13/2009    6.4 (NOTE)                                                                       According to the ADA Clinical Practice Recommendations for 2011, when HbA1c is  used as a screening test:   >=6.5%   Diagnostic of Diabetes Mellitus           (if abnormal result  is confirmed)  5.7-6.4%   Increased risk of developing Diabetes Mellitus  References:Diagnosis and Classification of Diabetes Mellitus,Diabetes Care,2011,34(Suppl 1):S62-S69 and Standards of Medical Care in         Diabetes - 2011,Diabetes Care,2011,34  (Suppl 1):S11-S61.    BNP (last 3 results)  Recent Labs  12/26/14 1020  BNP 115.6*    ProBNP (last 3 results)  Recent Labs  06/17/15 1017  PROBNP 210.0*     Other Studies Reviewed Today: Carotid duplex suggests 60-79% right ICA stenosis and 40-59% left ICA stenosis. Right ECA stenosis. No prior carotid duplex for comparison.   AAA duplex suggests 3.0 cm x 2.8 cm distal AAA. Right CIA measures 1.3 x 1.2 cm. Left CIA measures 1.5 x 1.5 cm. No significant change compared to exam on 09/17/14.    Echo Study Conclusions from 01/2015  - Left ventricle: The cavity size was normal. Wall thickness was   normal. Systolic function was normal. The estimated ejection   fraction was in the range of 50% to 55%. Wall motion was normal;   there were no regional wall motion abnormalities. Doppler   parameters are consistent with high ventricular filling pressure. - Ventricular septum: Septal motion showed paradox. These changes   are consistent with a  post-thoracotomy state. - Aortic valve: A bioprosthesis was present and functioning   normally. - Mitral valve: Calcified annulus. Mildly thickened leaflets . - Pulmonary arteries: PA peak pressure: 32 mm Hg (S).  Assessment/Plan: 1. Abnormal heart rhythm detected - consistent with new onset of atrial fib - rate is fair. Discussed at length with patient and wife and subsequently Dr. Elease Hashimoto. His CHADSVASC is at least 4. He is anemic - not clear why - at risk for bleeding. Has fallen with injury but not in the last 6 months. Have discussed the risk of starting anticoagulation. We think both  options (starting versus not starting) are risky. Will need lab today. If blood count has dropped would just do aspirin. If blood count stable - starting Eliquis 5 mg BID - samples given. Lab today and again in one week. Would not pursue cardioversion since he is basically asymptomatic.  Would like for Dr. Jens Som to see and give his input as well.   2. Prior AVR  3. PAD - followed by [facility] for his carotid disease and AAA  4. HLD - on statin  5. Pulmonary Fibrosis - on oxygen therapy with 4 to 6 liters.   6. HTN but with orthostasis -requiring Midodrine.   7. Anemia - undefined - may need to see hematology.   Current medicines are reviewed with the patient today.  The patient does not have concerns regarding medicines other than what has been noted above.  The following changes have been made:  See above.  Labs/ tests ordered today include:    Orders Placed This Encounter  Procedures  . Basic metabolic panel  . CBC  . TSH  . EKG 12-Lead     Disposition:   FU with Dr. Jens Som next week.   Patient is agreeable to this plan and will call if any problems develop in the interim.   Signed: Rosalio Macadamia, RN, ANP-C 09/26/2015 10:39 AM  [facility] [address] [place], [place]  [address] Phone: [phone] Fax: [phone]

## 2015-09-26 NOTE — Patient Instructions (Addendum)
We will be checking the following labs today - BMET, CBC, TSH   Medication Instructions:    Continue with your current medicines. BUT  We are starting Eliquis 5 mg to take twice a day - use the samples - I sent the RX to your pharmacy  STOP aspirin after tomorrow morning's dose    Testing/Procedures To Be Arranged:  N/A  Follow-Up:   See Dr. Jens Somrenshaw next week for repeat lab and further discussion.     Other Special Instructions:   N/A    If you need a refill on your cardiac medications before your next appointment, please call your pharmacy.   Call the [facility] office at [phone] if you have any questions, problems or concerns.

## 2015-09-29 NOTE — Progress Notes (Signed)
HPI: FU AVR. Previously diagnosed with severe AS by echo. Cardiac catheterization in Sept 2011 revealed nonobstructive coronary disease, normal LV function and moderately severe aortic stenosis with a mean gradient of 34 mm of mercury. The patient then underwent aortic valve replacement on September 21 of 2011 with a pericardial tissue valve. Echo 1/17 showed normal LV function, normal bioprosthetic valve. Ab ultrasound 8/17 showed abdominal aortic aneurysm of 3 x 2.8 cm. Carotid dopplers 8/17 showed 60-79 R and 40-59 L. PT also with ILD and BOOP. Seen recently and found to have new onset atrial fibrillation. TSH normal. Hgb 9.9. Pt has chronic anemia. Since I last saw him, He has dyspnea on exertionUnchanged from his pulmonary disease. No orthopnea, PND, pedal edema, chest pain, palpitations or syncope. No bleeding.  Current Outpatient Prescriptions  Medication Sig Dispense Refill  . AMBULATORY NON FORMULARY MEDICATION Rollator rolling walker with seat.  Diagnosis Physical Deconditioning 799.3 1 Units 0  . finasteride (PROSCAR) 5 MG tablet Take 1 tablet by mouth  daily 90 tablet 0  . furosemide (LASIX) 20 MG tablet Take 1 tablet by mouth  daily 90 tablet 1  . HYDROcodone-acetaminophen (NORCO/VICODIN) 5-325 MG tablet Take 0.5 tablets by mouth every 8 (eight) hours as needed for moderate pain. 30 tablet 0  . ketoconazole (NIZORAL) 2 % cream Apply 1 application topically 2 (two) times daily. To affected areas. 60 g 3  . loratadine (CLARITIN) 10 MG tablet Take 10 mg by mouth every morning.     . midodrine (PROAMATINE) 2.5 MG tablet TAKE 1 TABLET BY MOUTH  TWICE DAILY WITH A MEAL 180 tablet 0  . Multiple Vitamin (MULTIVITAMIN) tablet Take 1 tablet by mouth daily.    . NON FORMULARY Place 4 L into the nose daily. 6 liters and with activity 8 liters    . pantoprazole (PROTONIX) 40 MG tablet TAKE 1 TABLET BY MOUTH TWICE DAILY BEFORE A MEAL 180 tablet 0  . pravastatin (PRAVACHOL) 40 MG tablet Take 1  tablet by mouth at  bedtime 90 tablet 3  . predniSONE (DELTASONE) 10 MG tablet Take 1 tablet by mouth  daily with breakfast 90 tablet 0  . triamcinolone (NASACORT ALLERGY 24HR) 55 MCG/ACT AERO nasal inhaler Place 2 sprays into the nose daily. (Patient taking differently: Place 2 sprays into the nose daily as needed. ) 1 Inhaler 12   Current Facility-Administered Medications  Medication Dose Route Frequency Provider Last Rate Last Dose  . apixaban (ELIQUIS) tablet 5 mg  5 mg Oral BID Rosalio Macadamia, NP       Facility-Administered Medications Ordered in Other Visits  Medication Dose Route Frequency Provider Last Rate Last Dose  . iron dextran complex (INFED) 25 mg in sodium chloride 0.9 % 500 mL IVPB  25 mg Intravenous Once Sean Hommel, DO       Followed by  . iron dextran complex (INFED) 1,000 mg in sodium chloride 0.9 % 500 mL IVPB  1,000 mg Intravenous Once Laren Boom, DO         Past Medical History:  Diagnosis Date  . AAA (abdominal aortic aneurysm) (HCC)   . Aortic stenosis    AVR 2011  . Cleft palate   . Dermatitis    poison ivy  . GERD (gastroesophageal reflux disease)   . Hyperlipidemia   . Hypertension   . Interstitial lung disease (HCC)   . Pulmonary fibrosis (HCC)   . Syncope and collapse 07/18/2014    Past Surgical  History:  Procedure Laterality Date  . 7 surgeries for cleft palate  1938  . aortic valve replacement w/ pericardial tissue valve  10-15-09   Dr Barry Dieneswens  . bilat cataract sx  2004  . CHOLECYSTECTOMY  1999  . EYE SURGERY    . oral surgery with bone graft from hip for cleft palate  1989  . right groin hernia repair  1950's  . rotator cuff tear, left shoulder      Social History   Social History  . Marital status: Married    Spouse name: N/A  . Number of children: N/A  . Years of education: N/A   Occupational History  . Retired Retired   Social History Main Topics  . Smoking status: Former Smoker    Packs/day: 1.50    Years: 5.00    Types:  Cigarettes, Cigars    Quit date: 09/08/1977  . Smokeless tobacco: Never Used  . Alcohol use No  . Drug use: No  . Sexual activity: Not on file   Other Topics Concern  . Not on file   Social History Narrative  . No narrative on file    Family History  Problem Relation Age of Onset  . Heart attack Mother   . Cancer Father     lung and stomach  . Heart attack Father   . Cancer Sister     bone  . Diabetes Sister   . Heart murmur Brother   . Hypertension Brother   . Hyperlipidemia Brother   . Heart attack Brother     ROS: no fevers or chills, productive cough, hemoptysis, dysphasia, odynophagia, melena, hematochezia, dysuria, hematuria, rash, seizure activity, orthopnea, PND, pedal edema, claudication. Remaining systems are negative.  Physical Exam: Well-developed well-nourished in no acute distress.  Skin is warm and dry.  HEENT is normal.  Neck is supple.  Chest is clear to auscultation with normal expansion.  Cardiovascular exam is irregular Abdominal exam nontender or distended. No masses palpated. Extremities show no edema. neuro grossly intact  Electrocardiogram 09/26/2015-atrial fibrillation, with anterior fascicular block.  A/P  1 New-onset atrial fibrillation-the patient remains in atrial fibrillation on examination. Duration is unclear. He is asymptomatic. I favor rate control and anticoagulation. Schedule 24-hour Holter to make sure that rate is adequately controlled. He has now been initiated on apixaban. He does have a history of chronic anemia but no active bleeding. Check hemoglobin and renal function in 4 weeks.  2 hyperlipidemia-continue statin.  3 abdominal aortic aneurysm-followed by vascular surgery.  4 history of aortic valve replacement-continue SBE prophylaxis.  5 Cerebrovascular disease-continue statin. No aspirin given need for anticoagulation.  Roy MillersBrian Natina Wiginton, MD

## 2015-09-30 ENCOUNTER — Encounter: Payer: Self-pay | Admitting: Cardiology

## 2015-10-01 ENCOUNTER — Ambulatory Visit: Payer: Medicare Other

## 2015-10-01 ENCOUNTER — Ambulatory Visit (INDEPENDENT_AMBULATORY_CARE_PROVIDER_SITE_OTHER): Payer: Medicare Other | Admitting: Cardiology

## 2015-10-01 ENCOUNTER — Encounter: Payer: Self-pay | Admitting: Cardiology

## 2015-10-01 VITALS — BP 107/62 | HR 78 | Ht 73.0 in | Wt 210.8 lb

## 2015-10-01 DIAGNOSIS — Z79899 Other long term (current) drug therapy: Secondary | ICD-10-CM | POA: Diagnosis not present

## 2015-10-01 DIAGNOSIS — D649 Anemia, unspecified: Secondary | ICD-10-CM

## 2015-10-01 DIAGNOSIS — I259 Chronic ischemic heart disease, unspecified: Secondary | ICD-10-CM

## 2015-10-01 DIAGNOSIS — I359 Nonrheumatic aortic valve disorder, unspecified: Secondary | ICD-10-CM | POA: Diagnosis not present

## 2015-10-01 DIAGNOSIS — I4891 Unspecified atrial fibrillation: Secondary | ICD-10-CM

## 2015-10-01 DIAGNOSIS — I714 Abdominal aortic aneurysm, without rupture, unspecified: Secondary | ICD-10-CM

## 2015-10-01 NOTE — Patient Instructions (Addendum)
Your physician recommends that you continue on your current medications as directed. Please refer to the Current Medication list given to you today.   Your physician recommends that you schedule a follow-up appointment in: 3 MONTHS  WITH  CRENSHAW  Your physician has recommended that you wear a holter monitor. Holter monitors are medical devices that record the heart's electrical activity. Doctors most often use these monitors to diagnose arrhythmias. Arrhythmias are problems with the speed or rhythm of the heartbeat. The monitor is a small, portable device. You can wear one while you do your normal daily activities. This is usually used to diagnose what is causing palpitations/syncope (passing out).  24 HOUR   Your physician recommends that you return for lab work in:  4 WEEKS  CBC   BMET

## 2015-10-08 ENCOUNTER — Ambulatory Visit (INDEPENDENT_AMBULATORY_CARE_PROVIDER_SITE_OTHER): Payer: Medicare Other

## 2015-10-08 DIAGNOSIS — I4891 Unspecified atrial fibrillation: Secondary | ICD-10-CM

## 2015-10-17 ENCOUNTER — Other Ambulatory Visit: Payer: Self-pay | Admitting: Family Medicine

## 2015-10-21 ENCOUNTER — Other Ambulatory Visit: Payer: Self-pay | Admitting: *Deleted

## 2015-10-21 MED ORDER — APIXABAN 5 MG PO TABS: 5.0000 mg | ORAL_TABLET | Freq: Two times a day (BID) | ORAL | 11 refills | Status: AC

## 2015-10-22 ENCOUNTER — Ambulatory Visit (INDEPENDENT_AMBULATORY_CARE_PROVIDER_SITE_OTHER): Payer: Medicare Other | Admitting: Physician Assistant

## 2015-10-22 VITALS — BP 101/61 | HR 104 | Temp 98.1°F

## 2015-10-22 DIAGNOSIS — Z23 Encounter for immunization: Secondary | ICD-10-CM

## 2015-10-22 NOTE — Progress Notes (Signed)
Patient came into clinic today for flu shot. Pt tolerated high dose flu immunization in right deltoid well, no immediate complications.

## 2015-10-24 ENCOUNTER — Telehealth: Payer: Self-pay | Admitting: Physician Assistant

## 2015-10-24 NOTE — Telephone Encounter (Signed)
[facility] - apparently apixaban requires prior authorization. Pharmacy says they sent PA form on 10/21/15 but have not heard back yet. They are willing to dispense patient short term RX to get him through until this is achieved. I have no way of accessing his PA information after hours as I am on call at the hospital and office is closed. The patient's wife also paged me to discuss the situation. They are willing to pay for it themselves for short-term but says it is very expensive. She lamented that she was extremely upset at the delay in this. I let her know I would send a message to Triage to figure out the best way to handle this as efficiently as possible on Monday when office re-opens. I told her to also contact office on Monday if she has not yet heard back.  Insiya Oshea PA-C

## 2015-10-24 NOTE — Telephone Encounter (Signed)
Please help with apixaban. Olga MillersBrian Crenshaw

## 2015-10-27 NOTE — Telephone Encounter (Signed)
PA for eliquis complete and approved until12-31-18.

## 2015-10-30 ENCOUNTER — Encounter: Payer: Self-pay | Admitting: Cardiology

## 2015-10-31 ENCOUNTER — Other Ambulatory Visit: Payer: Self-pay | Admitting: *Deleted

## 2015-10-31 DIAGNOSIS — I4891 Unspecified atrial fibrillation: Secondary | ICD-10-CM

## 2015-11-01 ENCOUNTER — Other Ambulatory Visit: Payer: Self-pay | Admitting: Family Medicine

## 2015-11-03 ENCOUNTER — Encounter: Payer: Self-pay | Admitting: Family Medicine

## 2015-11-03 DIAGNOSIS — I4891 Unspecified atrial fibrillation: Secondary | ICD-10-CM | POA: Diagnosis not present

## 2015-11-03 LAB — CBC
HCT: 31.5 % — ABNORMAL LOW (ref 38.5–50.0)
Hemoglobin: 10.3 g/dL — ABNORMAL LOW (ref 13.2–17.1)
MCH: 26.3 pg — AB (ref 27.0–33.0)
MCHC: 32.7 g/dL (ref 32.0–36.0)
MCV: 80.4 fL (ref 80.0–100.0)
PLATELETS: 129 10*3/uL — AB (ref 140–400)
RBC: 3.92 MIL/uL — ABNORMAL LOW (ref 4.20–5.80)
RDW: 16.4 % — AB (ref 11.0–15.0)
WBC: 18.8 10*3/uL — ABNORMAL HIGH (ref 3.8–10.8)

## 2015-11-03 LAB — BASIC METABOLIC PANEL
BUN: 20 mg/dL (ref 7–25)
CALCIUM: 8.8 mg/dL (ref 8.6–10.3)
CO2: 34 mmol/L — ABNORMAL HIGH (ref 20–31)
Chloride: 97 mmol/L — ABNORMAL LOW (ref 98–110)
Creat: 1.1 mg/dL (ref 0.70–1.18)
Glucose, Bld: 92 mg/dL (ref 65–99)
POTASSIUM: 4.2 mmol/L (ref 3.5–5.3)
SODIUM: 141 mmol/L (ref 135–146)

## 2015-11-04 MED ORDER — MIDODRINE HCL 2.5 MG PO TABS: 2.5000 mg | ORAL_TABLET | Freq: Two times a day (BID) | ORAL | 0 refills | Status: DC

## 2015-11-05 ENCOUNTER — Encounter: Payer: Self-pay | Admitting: Internal Medicine

## 2015-11-05 ENCOUNTER — Ambulatory Visit (INDEPENDENT_AMBULATORY_CARE_PROVIDER_SITE_OTHER): Payer: Medicare Other | Admitting: Internal Medicine

## 2015-11-05 VITALS — BP 118/70 | HR 86 | Ht 73.0 in | Wt 208.0 lb

## 2015-11-05 DIAGNOSIS — J84112 Idiopathic pulmonary fibrosis: Secondary | ICD-10-CM

## 2015-11-05 DIAGNOSIS — J9611 Chronic respiratory failure with hypoxia: Secondary | ICD-10-CM

## 2015-11-05 DIAGNOSIS — I259 Chronic ischemic heart disease, unspecified: Secondary | ICD-10-CM | POA: Diagnosis not present

## 2015-11-05 NOTE — Progress Notes (Addendum)
Subjective:     Patient ID: [name], male   DOB: [birth year], 81 y.o.   MRN: [id number]  HPI   Ov 10/31/2014  Chief Complaint  Patient presents with  . Follow-up    Former PW pt here for follow up. pt states he is doing well, breathing is good. no c/o SOB, chest tightness, wheezing or cough.  pt on contiuous O2 set at 4LPM. 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 Transfer of care from Dr. Shan Levans to myself Dr. Marchelle Gearing after Dr. Shan Levans has retired from the pulmonary practice. According to the chart and patient and his wife [his wife works at [employer]) . He has a diagnosis of pulmonary fibrosis/Boop since at least 2014 based on CT scan of the chest [I personally visualized this]. I do not find evidence of a surgical lung biopsy. He is been on prednisone since then. According to his wife he was on pirefnidone from summer 2014 to early 2015 through the expanded access program. He developed significant weight loss and had to be hospitalized. After that his life is stabilized. His chronic dyspnea on exertion with mild chronic cough. Relieved by rest. He uses oxygen anywhere between 4 and 6 L. However he says that one day when he checked it on room air is pulse ox was 89% for 12 hours and therefore he questions whether he needs oxygen. In fact his most recent chest x-ray 09/26/2014 shows evidence of some chronic ILD but otherwise clear lung fields. He has been to pulmonary rehabilitation before he has never attended the pulmonary fibrosis support group but is interested in hearing on email from Mr. Army Chaco president of the support group.   Lab work from 2014 May 22 reveals highly positive ANA titer of 1: 1280 but normal rheumatoid factor, IgE, SSA, SSB and scleroderma 70 but he does have some and on symptoms. He also had blood allergy panel at this time which was negative    OV 03/19/2015  Chief Complaint  Patient presents with  . Follow-up    Pt had a  left humerus fracture in [name] 2017 - pt will not need surgery. Pt states his brearhing is doing well. Pt denies cough and CP/tightness.    81 year old male with idiopathic pulmonary fibrosis and chronic hypoxemic respiratory failure on 4-6 liters of oxygen. He did not tolerate esbriet and decided not to take ofev. He presents for routine 3 month follow-up with his wife [name] works at [employer]. He reports overall stability and respirator status. No changes. In the interim he had an accident and had a mild fracture to his left humerus that was treated conservatively and now he is better. Also around Christmas 2016 he had volume overload issues and was diuresed and now he is back to baseline.  He is somewhat open after much counseling to reduce prednisone dosage   08/05/2015 Follow up : IPF  Pt returns for 6 week  follow up .  Last ov with IPF flare with mild volume overload CXR showed some progression of IPF changes.  Prednisone was increased for 2 weeks to 20mg  . Now back to 10mg  daily.  He was given lasix x 2 days.  He is feeling better. O2 needs have decreased to 5-6 l/m .  he denies any fever or discolored mucus, chest pain, abdominal pain, nausea, vomiting or diarrhea.    Last 2-D echo was in January 2017 EF was 50-55%, prosthetic aortic valve was present and  functioning normally.. Pulmonary artery pressure 32. Moderate pulmonary valve regurgitation. Patient had pulmonary function test 05/2015  showed a decrease in lung function since 2016 FVC 28%, FEV1 37%, ratio 93, no significant bronchodilator response, DLCO 23%. Pulmonary function test in November 2016 showed a DLCO at 35%.   OV 11/05/2015  Chief Complaint  Patient presents with  . Follow-up    Pt states he feels his SOB has improved since last OV. Pt c/o occasssional prod cough with grey in color. Pt denies CP/tightness and f/c/s.    IPF - followup. Intolerant esbriet. Refused Ofev feb 2017 due to fear of side ffects. On 6L  o2. Likes daily prednisone. Slowly progressive disease as of May 2017 PFt  Here with wife [name]. Last seen by myself in February 2017. In the interim he had a flare up and saw nurse practitioner. He is back on prednisone. He could not taper himself down to 5 mg of prednisone. He is on 10 mg prednisone. He likes this dosage. He continues on 6 L oxygen at rest and exertion. Overall he reports stability. We did a King's interstitial lung disease questionnaire and compared to one year ago and is documented below terms of his functional status and shows comparedt o 1 year ago he is worse off. He is up-to-date with his flu shot.  He had recent lab workup and cardiology has a rising white count that he will address with cardiology of his primary care physician rest of the labs documented below.    K-BILD ILD QUESTIONNAIRE, Symptom score over prior 2 weeks  - shows he is very functional with very little complaints  7-none, 6-rarely, 5-occ, 5-some times, 3-sev times, 2-most times, 1-every time 10/31/2014  11/05/2015   Dyspnea for stairs, incline or hill 7 5  Chest Tightness 7 6  Worry about seriousness of lung complaint 4 4  Avoided doing things that make you dyspneic 6 4  Have you felt loss of control of lung condition (reversed from original) 7 7  Felt fed up due to lung condition 6 4  Felt urge to breathe aka air hunger 7 7  Has lung condition made you feel anxious 7 5  How often have you experienced wheezing or whistling sound 7 6  How much of the time have you felt your lung dz is getting worse 7 7  How much has your lung condition interfered with job or daily task 7 5  Were you expecting your lung condition to get worse 7 7  How much has your lung function limited you carrying things like groceris 7 2  How much has your lung function made you think of EOL? 7 4  Total 91 73  Are you financially worse off 6 4  Grand Total        Recent Labs Lab 11/03/15 0924  HGB 10.3*  HCT 31.5*  WBC  18.8*  PLT 129*    Recent Labs Lab 11/03/15 0925  NA 141  K 4.2  CL 97*  CO2 34*  GLUCOSE 92  BUN 20  CREATININE 1.10  CALCIUM 8.8        has a past medical history of AAA (abdominal aortic aneurysm) (HCC); Aortic stenosis; Cleft palate; Dermatitis; GERD (gastroesophageal reflux disease); Hyperlipidemia; Hypertension; Interstitial lung disease (HCC); Pulmonary fibrosis (HCC); and Syncope and collapse (07/18/2014).   reports that he quit smoking about 38 years ago. His smoking use included Cigarettes and Cigars. He has a 7.50 pack-year smoking history. He has  never used smokeless tobacco.  Past Surgical History:  Procedure Laterality Date  . 7 surgeries for cleft palate  1938  . aortic valve replacement w/ pericardial tissue valve  10-15-09   Dr Barry Dienes  . bilat cataract sx  2004  . CHOLECYSTECTOMY  1999  . EYE SURGERY    . oral surgery with bone graft from hip for cleft palate  1989  . right groin hernia repair  1950's  . rotator cuff tear, left shoulder      Allergies  Allergen Reactions  . Pravastatin Sodium Other (See Comments)    Sneezing episodes, can take medication and tolerates reaction.  . Pirfenidone Nausea And Vomiting    Immunization History  Administered Date(s) Administered  . Influenza Split 10/26/2011, 10/21/2014  . Influenza Whole 09/27/2012  . Influenza, High Dose Seasonal PF 10/22/2015  . Influenza,inj,Quad PF,36+ Mos 09/28/2013  . Pneumococcal Conjugate-13 09/28/2013  . Pneumococcal Polysaccharide-23 09/25/2009  . Tdap 01/25/2011  . Zoster 10/25/2012    Family History  Problem Relation Age of Onset  . Heart attack Mother   . Cancer Father     lung and stomach  . Heart attack Father   . Cancer Sister     bone  . Diabetes Sister   . Heart murmur Brother   . Hypertension Brother   . Hyperlipidemia Brother   . Heart attack Brother      Current Outpatient Prescriptions:  .  AMBULATORY NON FORMULARY MEDICATION, Rollator rolling walker  with seat.  Diagnosis Physical Deconditioning 799.3, Disp: 1 Units, Rfl: 0 .  apixaban (ELIQUIS) 5 MG TABS tablet, Take 1 tablet (5 mg total) by mouth 2 (two) times daily., Disp: 60 tablet, Rfl: 11 .  finasteride (PROSCAR) 5 MG tablet, Take 1 tablet by mouth  daily, Disp: 90 tablet, Rfl: 0 .  furosemide (LASIX) 20 MG tablet, Take 1 tablet by mouth  daily, Disp: 90 tablet, Rfl: 1 .  HYDROcodone-acetaminophen (NORCO/VICODIN) 5-325 MG tablet, Take 0.5 tablets by mouth every 8 (eight) hours as needed for moderate pain., Disp: 30 tablet, Rfl: 0 .  ketoconazole (NIZORAL) 2 % cream, Apply 1 application topically 2 (two) times daily. To affected areas., Disp: 60 g, Rfl: 3 .  loratadine (CLARITIN) 10 MG tablet, Take 10 mg by mouth every morning. , Disp: , Rfl:  .  midodrine (PROAMATINE) 2.5 MG tablet, Take 1 tablet (2.5 mg total) by mouth 2 (two) times daily with a meal., Disp: 180 tablet, Rfl: 0 .  Multiple Vitamin (MULTIVITAMIN) tablet, Take 1 tablet by mouth daily., Disp: , Rfl:  .  NON FORMULARY, Place 4 L into the nose daily. 6 liters and with activity 8 liters, Disp: , Rfl:  .  pantoprazole (PROTONIX) 40 MG tablet, TAKE 1 TABLET BY MOUTH TWICE DAILY BEFORE MEALS, Disp: 180 tablet, Rfl: 0 .  pravastatin (PRAVACHOL) 40 MG tablet, Take 1 tablet by mouth at  bedtime, Disp: 90 tablet, Rfl: 3 .  predniSONE (DELTASONE) 10 MG tablet, Take 1 tablet by mouth  daily with breakfast, Disp: 90 tablet, Rfl: 0 .  triamcinolone (NASACORT ALLERGY 24HR) 55 MCG/ACT AERO nasal inhaler, Place 2 sprays into the nose daily. (Patient taking differently: Place 2 sprays into the nose daily as needed. ), Disp: 1 Inhaler, Rfl: 12  Current Facility-Administered Medications:  .  apixaban (ELIQUIS) tablet 5 mg, 5 mg, Oral, BID, Rosalio Macadamia, NP  Facility-Administered Medications Ordered in Other Visits:  .  iron dextran complex (INFED) 25 mg in sodium  chloride 0.9 % 500 mL IVPB, 25 mg, Intravenous, Once **FOLLOWED BY** iron  dextran complex (INFED) 1,000 mg in sodium chloride 0.9 % 500 mL IVPB, 1,000 mg, Intravenous, Once, Laren Boom, DO   Review of Systems     Objective:   Physical Exam  Constitutional: He is oriented to person, place, and time. He appears well-developed and well-nourished. No distress.  HENT:  Head: Normocephalic and atraumatic.  Right Ear: External ear normal.  Left Ear: External ear normal.  Mouth/Throat: Oropharynx is clear and moist. No oropharyngeal exudate.  Remote cleft lip  Eyes: Conjunctivae and EOM are normal. Pupils are equal, round, and reactive to light. Right eye exhibits no discharge. Left eye exhibits no discharge. No scleral icterus.  Neck: Normal range of motion. Neck supple. No JVD present. No tracheal deviation present. No thyromegaly present.  Cardiovascular: Normal rate, regular rhythm and intact distal pulses.  Exam reveals no gallop and no friction rub.   No murmur heard. Pulmonary/Chest: Effort normal. No respiratory distress. He has no wheezes. He has rales. He exhibits no tenderness.  Abdominal: Soft. Bowel sounds are normal. He exhibits no distension and no mass. There is no tenderness. There is no rebound and no guarding.  Musculoskeletal: Normal range of motion. He exhibits no edema or tenderness.  Lymphadenopathy:    He has no cervical adenopathy.  Neurological: He is alert and oriented to person, place, and time. He has normal reflexes. No cranial nerve deficit. Coordination normal.  Skin: Skin is warm and dry. No rash noted. He is not diaphoretic. No erythema. No pallor.  Psychiatric: He has a normal mood and affect. His behavior is normal. Judgment and thought content normal.  Nursing note and vitals reviewed.    Vitals:   11/05/15 0906  BP: 118/70  Pulse: 86  SpO2: 94%  Weight: 208 lb (94.3 kg)  Height: 6\' 1"  (1.854 m)   Estimated body mass index is 27.44 kg/m as calculated from the following:   Height as of this encounter: 6\' 1"  (1.854 m).    Weight as of this encounter: 208 lb (94.3 kg).      Assessment:       ICD-9-CM ICD-10-CM   1. IPF (idiopathic pulmonary fibrosis) (HCC) 516.31 J84.112   2. Chronic hypoxemic respiratory failure (HCC) 518.83 J96.11    799.02         Plan:      Clinically stable since last visti but progressive symptoms and pft since year ago  PLAN Continue on  Prednisone 10mg  daily and hold at this dose.  Continue on 6l/m of oxygen .  Glad uptodate with flu shot Respect desire not to do Ofev  PLAN Follow up with Dr. Marchelle Gearing in 6 months and As needed    - Pre-bd spiro and dlco only. No lung volume or bd response. No post-bd spiro at 6 month followup  Please contact office for sooner follow up if symptoms do not improve or worsen or seek emergency care    Dr. Kalman Shan, M.D., [facility].C.P Pulmonary and Critical Care Medicine Staff Physician [facility] Pager: [phone], If no answer or between  15:00h - 7:00h: call [phone]  11/05/2015 9:33 AM

## 2015-11-05 NOTE — Patient Instructions (Addendum)
ICD-9-CM ICD-10-CM   1. IPF (idiopathic pulmonary fibrosis) (HCC) 516.31 J84.112   2. Chronic hypoxemic respiratory failure (HCC) 518.83 J96.11    799.02      Clinically stable  PLAN Continue on  Prednisone 10mg  daily and hold at this dose.  Continue on 6l/m of oxygen .  Glad uptodate with flu shot Respect desire not to do Ofev  PLAN Follow up with Dr. Marchelle Gearingamaswamy in 6 months and As needed    - Pre-bd spiro and dlco only. No lung volume or bd response. No post-bd spiro at 6 month followup  Please contact office for sooner follow up if symptoms do not improve or worsen or seek emergency care

## 2015-12-26 NOTE — Progress Notes (Signed)
HPI: FU AVR. Previously diagnosed with severe AS by echo. Cardiac catheterization in Sept 2011 revealed nonobstructive coronary disease, normal LV function and moderately severe aortic stenosis with a mean gradient of 34 mm of mercury. The patient then underwent aortic valve replacement on September 21 of 2011 with a pericardial tissue valve. Echo 1/17 showed normal LV function, normal bioprosthetic valve. Ab ultrasound 8/17 showed abdominal aortic aneurysm of 3 x 2.8 cm. Carotid dopplers 8/17 showed 60-79 R and 40-59 L. PT also with ILD and BOOP. Seen recently and found to have new onset atrial fibrillation. TSH normal. Hgb 9.9. Pt has chronic anemia. Holter 9/17 showed afib rate controlled. Since I last saw him, he has dyspnea with minimal exertion. Occasional orthopnea. He had recent pedal edema that improved with Lasix. No chest pain, palpitations, syncope or bleeding. No syncope.  Current Outpatient Prescriptions  Medication Sig Dispense Refill  . AMBULATORY NON FORMULARY MEDICATION Rollator rolling walker with seat.  Diagnosis Physical Deconditioning 799.3 1 Units 0  . apixaban (ELIQUIS) 5 MG TABS tablet Take 1 tablet (5 mg total) by mouth 2 (two) times daily. 60 tablet 11  . finasteride (PROSCAR) 5 MG tablet Take 1 tablet by mouth  daily 90 tablet 0  . furosemide (LASIX) 20 MG tablet Take 1 tablet by mouth  daily 90 tablet 1  . HYDROcodone-acetaminophen (NORCO/VICODIN) 5-325 MG tablet Take 0.5 tablets by mouth every 8 (eight) hours as needed for moderate pain. 30 tablet 0  . ketoconazole (NIZORAL) 2 % cream Apply 1 application topically 2 (two) times daily. To affected areas. 60 g 3  . loratadine (CLARITIN) 10 MG tablet Take 10 mg by mouth every morning.     . midodrine (PROAMATINE) 2.5 MG tablet Take 1 tablet (2.5 mg total) by mouth 2 (two) times daily with a meal. 180 tablet 0  . Multiple Vitamin (MULTIVITAMIN) tablet Take 1 tablet by mouth daily.    . NON FORMULARY Place 4 L into the  nose daily. 6 liters and with activity 8 liters    . pantoprazole (PROTONIX) 40 MG tablet TAKE 1 TABLET BY MOUTH TWICE DAILY BEFORE MEALS 180 tablet 0  . pravastatin (PRAVACHOL) 40 MG tablet Take 1 tablet by mouth at  bedtime 90 tablet 3  . predniSONE (DELTASONE) 10 MG tablet Take 1 tablet by mouth  daily with breakfast 90 tablet 0  . triamcinolone (NASACORT ALLERGY 24HR) 55 MCG/ACT AERO nasal inhaler Place 2 sprays into the nose daily. (Patient taking differently: Place 2 sprays into the nose daily as needed. ) 1 Inhaler 12   Current Facility-Administered Medications  Medication Dose Route Frequency Provider Last Rate Last Dose  . apixaban (ELIQUIS) tablet 5 mg  5 mg Oral BID Rosalio MacadamiaLori C Gerhardt, NP       Facility-Administered Medications Ordered in Other Visits  Medication Dose Route Frequency Provider Last Rate Last Dose  . iron dextran complex (INFED) 25 mg in sodium chloride 0.9 % 500 mL IVPB  25 mg Intravenous Once Sean Hommel, DO       Followed by  . iron dextran complex (INFED) 1,000 mg in sodium chloride 0.9 % 500 mL IVPB  1,000 mg Intravenous Once Laren BoomSean Hommel, DO         Past Medical History:  Diagnosis Date  . AAA (abdominal aortic aneurysm) (HCC)   . Aortic stenosis    AVR 2011  . Cleft palate   . Dermatitis    poison ivy  .  GERD (gastroesophageal reflux disease)   . Hyperlipidemia   . Hypertension   . Interstitial lung disease (HCC)   . Pulmonary fibrosis (HCC)   . Syncope and collapse 07/18/2014    Past Surgical History:  Procedure Laterality Date  . 7 surgeries for cleft palate  1938  . aortic valve replacement w/ pericardial tissue valve  10-15-09   Dr Barry Dieneswens  . bilat cataract sx  2004  . CHOLECYSTECTOMY  1999  . EYE SURGERY    . oral surgery with bone graft from hip for cleft palate  1989  . right groin hernia repair  1950's  . rotator cuff tear, left shoulder      Social History   Social History  . Marital status: Married    Spouse name: N/A  . Number of  children: N/A  . Years of education: N/A   Occupational History  . Retired Retired   Social History Main Topics  . Smoking status: Former Smoker    Packs/day: 1.50    Years: 5.00    Types: Cigarettes, Cigars    Quit date: 09/08/1977  . Smokeless tobacco: Never Used  . Alcohol use No  . Drug use: No  . Sexual activity: Not on file   Other Topics Concern  . Not on file   Social History Narrative  . No narrative on file    Family History  Problem Relation Age of Onset  . Heart attack Mother   . Cancer Father     lung and stomach  . Heart attack Father   . Cancer Sister     bone  . Diabetes Sister   . Heart murmur Brother   . Hypertension Brother   . Hyperlipidemia Brother   . Heart attack Brother     ROS: no fevers or chills, productive cough, hemoptysis, dysphasia, odynophagia, melena, hematochezia, dysuria, hematuria, rash, seizure activity, orthopnea, PND, pedal edema, claudication. Remaining systems are negative.  Physical Exam: Well-developed well-nourished in no acute distress.  Skin is warm and dry.  HEENT is normal.  Neck is supple.  Chest is clear to auscultation with normal expansion.  Cardiovascular exam is irregular Abdominal exam nontender or distended. No masses palpated. Extremities show trace edema. neuro grossly intact  A/P  1 atrial fibrillation-patient remains in atrial fibrillation. Continue apixaban. Rate is controlled on no medications. Check hemoglobin and renal function. He is having some fatigue and his wife wonders whether her anemia may be contributing.  2 hyperlipidemia-continue statin.  3 abdominal aortic aneurysm-he will need follow-up ultrasound August 2018.  4 dyspnea-probable majority related to interstitial lung disease. Question if mild CHF is contributing. Add Lasix 20 mg every other day. Check potassium, renal function and BNP in 1 week. Follow-up pulmonary for interstitial lung disease.  5 history of aortic valve  replacement-continue SBE prophylaxis.  6 carotid artery disease-continue statin. No aspirin given need for anticoagulation. Follow-up carotid Dopplers August 2018.  Olga MillersBrian Alie Moudy, MD

## 2015-12-29 ENCOUNTER — Encounter: Payer: Self-pay | Admitting: Cardiology

## 2015-12-31 ENCOUNTER — Encounter: Payer: Self-pay | Admitting: Cardiology

## 2015-12-31 ENCOUNTER — Ambulatory Visit (INDEPENDENT_AMBULATORY_CARE_PROVIDER_SITE_OTHER): Payer: Medicare Other | Admitting: Cardiology

## 2015-12-31 VITALS — BP 105/74 | HR 100 | Ht 73.0 in | Wt 207.8 lb

## 2015-12-31 DIAGNOSIS — I4891 Unspecified atrial fibrillation: Secondary | ICD-10-CM | POA: Diagnosis not present

## 2015-12-31 DIAGNOSIS — D649 Anemia, unspecified: Secondary | ICD-10-CM | POA: Diagnosis not present

## 2015-12-31 DIAGNOSIS — I359 Nonrheumatic aortic valve disorder, unspecified: Secondary | ICD-10-CM

## 2015-12-31 DIAGNOSIS — I259 Chronic ischemic heart disease, unspecified: Secondary | ICD-10-CM

## 2015-12-31 DIAGNOSIS — R0602 Shortness of breath: Secondary | ICD-10-CM

## 2015-12-31 DIAGNOSIS — Z79899 Other long term (current) drug therapy: Secondary | ICD-10-CM | POA: Diagnosis not present

## 2015-12-31 MED ORDER — FUROSEMIDE 20 MG PO TABS: 20.0000 mg | ORAL_TABLET | ORAL | 3 refills | Status: DC

## 2015-12-31 NOTE — Patient Instructions (Signed)
Medication Instructions:   TAKE FUROSEMIDE 20 MG ONE TABLET EVERY OTHER DAY  Labwork:  Your physician recommends that you HAVE LAB WORK TODAY  Your physician recommends that you return for lab work in: ONE WEEK  Follow-Up:  Your physician recommends that you schedule a follow-up appointment in: 6 WEEKS WITH DR Jens SomRENSHAW

## 2016-01-01 LAB — CBC WITH DIFFERENTIAL/PLATELET
Basophils Absolute: 0 cells/uL (ref 0–200)
Basophils Relative: 0 %
EOS PCT: 0 %
Eosinophils Absolute: 0 cells/uL — ABNORMAL LOW (ref 15–500)
HCT: 28.7 % — ABNORMAL LOW (ref 38.5–50.0)
Hemoglobin: 9.4 g/dL — ABNORMAL LOW (ref 13.2–17.1)
LYMPHS PCT: 3 %
Lymphs Abs: 546 cells/uL — ABNORMAL LOW (ref 850–3900)
MCH: 25.9 pg — ABNORMAL LOW (ref 27.0–33.0)
MCHC: 32.8 g/dL (ref 32.0–36.0)
MCV: 79.1 fL — ABNORMAL LOW (ref 80.0–100.0)
MONOS PCT: 8 %
Monocytes Absolute: 1456 cells/uL — ABNORMAL HIGH (ref 200–950)
NEUTROS ABS: 16198 {cells}/uL — AB (ref 1500–7800)
Neutrophils Relative %: 89 %
PLATELETS: 131 10*3/uL — AB (ref 140–400)
RBC: 3.63 MIL/uL — AB (ref 4.20–5.80)
RDW: 17 % — AB (ref 11.0–15.0)
WBC: 18.2 10*3/uL — AB (ref 3.8–10.8)

## 2016-01-05 ENCOUNTER — Ambulatory Visit (INDEPENDENT_AMBULATORY_CARE_PROVIDER_SITE_OTHER): Payer: Medicare Other

## 2016-01-05 ENCOUNTER — Encounter: Payer: Self-pay | Admitting: Family Medicine

## 2016-01-05 ENCOUNTER — Ambulatory Visit (INDEPENDENT_AMBULATORY_CARE_PROVIDER_SITE_OTHER): Payer: Medicare Other | Admitting: Family Medicine

## 2016-01-05 ENCOUNTER — Telehealth: Payer: Self-pay | Admitting: *Deleted

## 2016-01-05 VITALS — BP 131/86 | HR 101 | Ht 73.0 in | Wt 209.0 lb

## 2016-01-05 DIAGNOSIS — J8410 Pulmonary fibrosis, unspecified: Secondary | ICD-10-CM

## 2016-01-05 DIAGNOSIS — D509 Iron deficiency anemia, unspecified: Secondary | ICD-10-CM

## 2016-01-05 DIAGNOSIS — J209 Acute bronchitis, unspecified: Secondary | ICD-10-CM

## 2016-01-05 DIAGNOSIS — J84112 Idiopathic pulmonary fibrosis: Secondary | ICD-10-CM

## 2016-01-05 DIAGNOSIS — I259 Chronic ischemic heart disease, unspecified: Secondary | ICD-10-CM | POA: Diagnosis not present

## 2016-01-05 DIAGNOSIS — Z952 Presence of prosthetic heart valve: Secondary | ICD-10-CM

## 2016-01-05 DIAGNOSIS — J0110 Acute frontal sinusitis, unspecified: Secondary | ICD-10-CM

## 2016-01-05 DIAGNOSIS — H9123 Sudden idiopathic hearing loss, bilateral: Secondary | ICD-10-CM

## 2016-01-05 DIAGNOSIS — R0602 Shortness of breath: Secondary | ICD-10-CM | POA: Diagnosis not present

## 2016-01-05 MED ORDER — LEVOFLOXACIN 500 MG PO TABS: 500.0000 mg | ORAL_TABLET | Freq: Every day | ORAL | 0 refills | Status: DC

## 2016-01-05 MED ORDER — PREDNISONE 20 MG PO TABS: 40.0000 mg | ORAL_TABLET | Freq: Every day | ORAL | 0 refills | Status: DC

## 2016-01-05 NOTE — Telephone Encounter (Signed)
lvm informing pt's wife that abx has been sent and that the results of the cxr have not benn finalized yet. Laureen Ochs.Elza Varricchio, Viann Shoveonya Lynetta

## 2016-01-05 NOTE — Progress Notes (Signed)
Subjective:    CC:   HPI:  Patient has been an established patient with our practice for several years. He was previously seeing Dr. Ivan AnchorsHommel. His physician recently left the practice that he is here to establish care with me today.  He does have a history of aortic valve replacement and is currently on Eliquis.Does like his swelling overall is fairly well controlled.  Heart failure - he is on lasix every other day.  Due to repeat a BMP later this week just to make sure that his renal function is stable on his new dosing.  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  He does get a choking feeling in his chest at time. He feels like he gets a pressure and then gets some phlegm buildup in the upper chest. He says this actually started about 4 weeks ago.  He gets up around 4-5 PM in the afternoon and feels weak and has a hard time walking. Will get easily winded.   Getting yellow/green congestion out of his chest.  He says he is also noticed that his stamina for physical exertion has dropped over the last 4 weeks as well.  Anemia - no blood in the stool.   He says Dr. Ivan AnchorsHommel actually worked him up for this previously and found no cause for the blood.  His wife is here with him today and says that she is concerned that he has had a significant change in his hearing over the last 2 weeks. She says that he's been turning up the volume on the TV and not seeming to hear as well. 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 He denies any pain in the ears but says he has had a lot of sinus congestion and pressure in his 4 head and face..  Past medical history, Surgical history, Family history not pertinant except as noted below, Social history, Allergies, and medications have been entered into the medical record, reviewed, and corrections made.   Review of Systems: No fevers, chills, night sweats, weight loss, chest pain, or shortness of breath.   BP 131/86   Pulse  (!) 101   Ht 6\' 1"  (1.854 m)   Wt 209 lb (94.8 kg)   SpO2 100% Comment: 4L  BMI 27.57 kg/m     Allergies  Allergen Reactions  . Pravastatin Sodium Other (See Comments)    Sneezing episodes, can take medication and tolerates reaction.  . Pirfenidone Nausea And Vomiting    Past Medical History:  Diagnosis Date  . AAA (abdominal aortic aneurysm) (HCC)   . Aortic stenosis    AVR 2011  . Cleft palate   . Dermatitis    poison ivy  . GERD (gastroesophageal reflux disease)   . Hyperlipidemia   . Hypertension   . Interstitial lung disease (HCC)   . Pulmonary fibrosis (HCC)   . Syncope and collapse 07/18/2014    Past Surgical History:  Procedure Laterality Date  . 7 surgeries for cleft palate  1938  . aortic valve replacement w/ pericardial tissue valve  10-15-09   Dr Barry Dieneswens  . bilat cataract sx  2004  . CHOLECYSTECTOMY  1999  . EYE SURGERY    . oral surgery with bone graft from hip for cleft palate  1989  . right groin hernia repair  1950's  . rotator cuff tear, left shoulder      Social History   Social History  . Marital status:  Married    Spouse name: N/A  . Number of children: N/A  . Years of education: N/A   Occupational History  . Retired Retired   Social History Main Topics  . Smoking status: Former Smoker    Packs/day: 1.50    Years: 5.00    Types: Cigarettes, Cigars    Quit date: 09/08/1977  . Smokeless tobacco: Never Used  . Alcohol use No  . Drug use: No  . Sexual activity: Not on file   Other Topics Concern  . Not on file   Social History Narrative  . No narrative on file    Family History  Problem Relation Age of Onset  . Heart attack Mother   . Cancer Father     lung and stomach  . Heart attack Father   . Cancer Sister     bone  . Diabetes Sister   . Heart murmur Brother   . Hypertension Brother   . Hyperlipidemia Brother   . Heart attack Brother     Outpatient Encounter Prescriptions as of 01/05/2016  Medication Sig  .  AMBULATORY NON FORMULARY MEDICATION Rollator rolling walker with seat.  Diagnosis Physical Deconditioning 799.3  . apixaban (ELIQUIS) 5 MG TABS tablet Take 1 tablet (5 mg total) by mouth 2 (two) times daily.  . finasteride (PROSCAR) 5 MG tablet Take 1 tablet by mouth  daily  . furosemide (LASIX) 20 MG tablet Take 1 tablet (20 mg total) by mouth every other day.  . ketoconazole (NIZORAL) 2 % cream Apply 1 application topically 2 (two) times daily. To affected areas.  . loratadine (CLARITIN) 10 MG tablet Take 10 mg by mouth every morning.   . midodrine (PROAMATINE) 2.5 MG tablet Take 1 tablet (2.5 mg total) by mouth 2 (two) times daily with a meal.  . Multiple Vitamin (MULTIVITAMIN) tablet Take 1 tablet by mouth daily.  . NON FORMULARY Place 4 L into the nose daily. 6 liters and with activity 8 liters  . pantoprazole (PROTONIX) 40 MG tablet TAKE 1 TABLET BY MOUTH TWICE DAILY BEFORE MEALS  . pravastatin (PRAVACHOL) 40 MG tablet Take 1 tablet by mouth at  bedtime  . predniSONE (DELTASONE) 10 MG tablet Take 1 tablet by mouth  daily with breakfast  . levofloxacin (LEVAQUIN) 500 MG tablet Take 1 tablet (500 mg total) by mouth daily.  . predniSONE (DELTASONE) 20 MG tablet Take 2 tablets (40 mg total) by mouth daily. X 5 days  . [DISCONTINUED] HYDROcodone-acetaminophen (NORCO/VICODIN) 5-325 MG tablet Take 0.5 tablets by mouth every 8 (eight) hours as needed for moderate pain.  . [DISCONTINUED] triamcinolone (NASACORT ALLERGY 24HR) 55 MCG/ACT AERO nasal inhaler Place 2 sprays into the nose daily. (Patient taking differently: Place 2 sprays into the nose daily as needed. )   Facility-Administered Encounter Medications as of 01/05/2016  Medication  . iron dextran complex (INFED) 25 mg in sodium chloride 0.9 % 500 mL IVPB   Followed by  . iron dextran complex (INFED) 1,000 mg in sodium chloride 0.9 % 500 mL IVPB  . [DISCONTINUED] apixaban (ELIQUIS) tablet 5 mg       Objective:    General: Well  Developed, well nourished, and in no acute distress.  Neuro: Alert and oriented x3, extra-ocular muscles intact, sensation grossly intact.  HEENT: Normocephalic, atraumatic, Oropharynx is clear, TM and canal are clear on the left ear. There is a chronic perforation in the right ear. No drainage or fluid day. There is some distortion of the  light reflex on the right.  Skin: Warm and dry, no rashes. Cardiac: Regular rate and rhythm, no murmurs rubs or gallops, no lower extremity edema.  Respiratory: Clear to auscultation bilaterally. Not using accessory muscles, speaking in full sentences. Extremities: He does have some trace edema in his hands as well as left ankle today.   Impression and Recommendations:    Aortic valve replacement. On Eliquis.    History of heart failure-currently on Lasix every other day. Due for BMP later this week.  Idiopathic pulmonary fibrosis with underlying exacerbation with acute bronchitis. With increased cough sputum production and fatigue and shortest of breath with activity over the last 4 weeks. We'll get a chest x-ray today. Will increase his prednisone to 40 mg for 5 days and then he can go back down to 10 mg. Next  Hearing loss-suspect may be related to pressure from his sinuses. Suspect he also has an acute sinus infection. Will treat with antibiotics. If not significantly better in one week with that and a bump up in prednisone them please call us back.

## 2016-01-06 LAB — VITAMIN B12: Vitamin B-12: 660 pg/mL (ref 200–1100)

## 2016-01-06 LAB — CBC WITH DIFFERENTIAL/PLATELET
BASOS ABS: 0 {cells}/uL (ref 0–200)
Basophils Relative: 0 %
EOS ABS: 0 {cells}/uL — AB (ref 15–500)
Eosinophils Relative: 0 %
HCT: 27.9 % — ABNORMAL LOW (ref 38.5–50.0)
Hemoglobin: 9 g/dL — ABNORMAL LOW (ref 13.2–17.1)
LYMPHS PCT: 4 %
Lymphs Abs: 700 cells/uL — ABNORMAL LOW (ref 850–3900)
MCH: 25.5 pg — AB (ref 27.0–33.0)
MCHC: 32.3 g/dL (ref 32.0–36.0)
MCV: 79 fL — AB (ref 80.0–100.0)
MONOS PCT: 8 %
Monocytes Absolute: 1400 cells/uL — ABNORMAL HIGH (ref 200–950)
NEUTROS PCT: 88 %
Neutro Abs: 15400 cells/uL — ABNORMAL HIGH (ref 1500–7800)
PLATELETS: 154 10*3/uL (ref 140–400)
RBC: 3.53 MIL/uL — ABNORMAL LOW (ref 4.20–5.80)
RDW: 17.3 % — AB (ref 11.0–15.0)
WBC: 17.5 10*3/uL — ABNORMAL HIGH (ref 3.8–10.8)

## 2016-01-06 LAB — IRON: Iron: 24 ug/dL — ABNORMAL LOW (ref 50–180)

## 2016-01-06 LAB — FERRITIN: Ferritin: 495 ng/mL — ABNORMAL HIGH (ref 20–380)

## 2016-01-06 LAB — BRAIN NATRIURETIC PEPTIDE: Brain Natriuretic Peptide: 123.4 pg/mL — ABNORMAL HIGH (ref <100)

## 2016-01-06 NOTE — Addendum Note (Signed)
Addended by: Deno EtienneBARKLEY, Nevin Kozuch L on: 01/06/2016 12:52 PM   Modules accepted: Orders

## 2016-01-07 LAB — URINALYSIS, MICROSCOPIC ONLY
Bacteria, UA: NONE SEEN [HPF]
Casts: NONE SEEN [LPF]
Crystals: NONE SEEN [HPF]
Yeast: NONE SEEN [HPF]

## 2016-01-09 ENCOUNTER — Other Ambulatory Visit: Payer: Self-pay

## 2016-01-09 LAB — BASIC METABOLIC PANEL

## 2016-01-09 MED ORDER — MIDODRINE HCL 2.5 MG PO TABS: 2.5000 mg | ORAL_TABLET | Freq: Two times a day (BID) | ORAL | 0 refills | Status: DC

## 2016-01-12 ENCOUNTER — Other Ambulatory Visit: Payer: Self-pay | Admitting: *Deleted

## 2016-01-12 ENCOUNTER — Encounter: Payer: Self-pay | Admitting: Family Medicine

## 2016-01-12 DIAGNOSIS — I951 Orthostatic hypotension: Secondary | ICD-10-CM

## 2016-01-12 NOTE — Telephone Encounter (Signed)
Let's check on the hematology referral and see what might be going on.

## 2016-01-13 DIAGNOSIS — I4891 Unspecified atrial fibrillation: Secondary | ICD-10-CM | POA: Diagnosis not present

## 2016-01-13 LAB — BASIC METABOLIC PANEL
BUN: 22 mg/dL (ref 7–25)
CALCIUM: 9.3 mg/dL (ref 8.6–10.3)
CO2: 35 mmol/L — ABNORMAL HIGH (ref 20–31)
CREATININE: 1.19 mg/dL — AB (ref 0.70–1.18)
Chloride: 95 mmol/L — ABNORMAL LOW (ref 98–110)
Glucose, Bld: 99 mg/dL (ref 65–99)
Potassium: 4.2 mmol/L (ref 3.5–5.3)
SODIUM: 141 mmol/L (ref 135–146)

## 2016-01-13 LAB — CBC
HCT: 32.6 % — ABNORMAL LOW (ref 38.5–50.0)
Hemoglobin: 10.5 g/dL — ABNORMAL LOW (ref 13.2–17.1)
MCH: 26.1 pg — AB (ref 27.0–33.0)
MCHC: 32.2 g/dL (ref 32.0–36.0)
MCV: 80.9 fL (ref 80.0–100.0)
RBC: 4.03 MIL/uL — ABNORMAL LOW (ref 4.20–5.80)
RDW: 17.9 % — ABNORMAL HIGH (ref 11.0–15.0)
WBC: 21.9 10*3/uL — ABNORMAL HIGH (ref 3.8–10.8)

## 2016-01-15 ENCOUNTER — Encounter: Payer: Self-pay | Admitting: Family Medicine

## 2016-01-15 ENCOUNTER — Other Ambulatory Visit: Payer: Self-pay | Admitting: Family Medicine

## 2016-01-15 ENCOUNTER — Ambulatory Visit (INDEPENDENT_AMBULATORY_CARE_PROVIDER_SITE_OTHER): Payer: Medicare Other | Admitting: Family Medicine

## 2016-01-15 VITALS — BP 101/57 | HR 96 | Ht 73.0 in | Wt 207.0 lb

## 2016-01-15 DIAGNOSIS — R7309 Other abnormal glucose: Secondary | ICD-10-CM | POA: Diagnosis not present

## 2016-01-15 DIAGNOSIS — J209 Acute bronchitis, unspecified: Secondary | ICD-10-CM | POA: Diagnosis not present

## 2016-01-15 DIAGNOSIS — M7989 Other specified soft tissue disorders: Secondary | ICD-10-CM

## 2016-01-15 DIAGNOSIS — J8410 Pulmonary fibrosis, unspecified: Secondary | ICD-10-CM | POA: Diagnosis not present

## 2016-01-15 DIAGNOSIS — I259 Chronic ischemic heart disease, unspecified: Secondary | ICD-10-CM | POA: Diagnosis not present

## 2016-01-15 DIAGNOSIS — Z7952 Long term (current) use of systemic steroids: Secondary | ICD-10-CM | POA: Diagnosis not present

## 2016-01-15 LAB — POCT GLYCOSYLATED HEMOGLOBIN (HGB A1C): HEMOGLOBIN A1C: 5.2

## 2016-01-15 MED ORDER — PREDNISONE 10 MG PO TABS: 20.0000 mg | ORAL_TABLET | Freq: Every day | ORAL | 0 refills | Status: DC

## 2016-01-15 NOTE — Patient Instructions (Signed)
Go ahead and increase her prednisone to 20 mg daily for the next 5 days and then try going back down to 10 mg next Tuesday.

## 2016-01-15 NOTE — Addendum Note (Signed)
Addended by: Nani GasserMETHENEY, CATHERINE D on: 01/15/2016 11:58 AM   Modules accepted: Orders

## 2016-01-15 NOTE — Progress Notes (Signed)
Subjective:    CC: F/U Bronchitis   HPI:  Follow-up acute respiratory illness with underlying hepatic primary fibrosis. He actually says he is feeling much better. He says the 40 mg of prednisone made a big difference in his shortness of breath. He says his cough is significantly better and sputum production is almost completely gone. He is not having any fevers or chills. He also completed the antibiotic. He went back down to the 10 mg prednisone and today is feeling a little weak and tired. His hearing is actually better as well. He does have a buzzing in his ears. He says today his legs just feel tired and sore and tight. He said he felt something on the floor and after trying to clean it out it just completely tired him out.  Extremities swelling-he's been taking his furosemide to half a tab every other day and for the most part that's been working well that he did wake up quite swollen this morning and says he took a whole tab this morning.  Past medical history, Surgical history, Family history not pertinant except as noted below, Social history, Allergies, and medications have been entered into the medical record, reviewed, and corrections made.   Review of Systems: No fevers, chills, night sweats, weight loss, chest pain, or shortness of breath.   Objective:    General: Well Developed, well nourished, and in no acute distress.  Neuro: Alert and oriented x3, extra-ocular muscles intact, sensation grossly intact.  HEENT: Normocephalic, atraumatic  Skin: Warm and dry, no rashes. Cardiac: Regular rate and rhythm, no murmurs rubs or gallops, no lower extremity edema.  Respiratory: Good air movement overall though a little bit of coarseness at the bases bilaterally. No crackles.. Not using accessory muscles, speaking in full sentences. Extremities: 1+ pitting edema of the ankles bilaterally.   Impression and Recommendations:   Bronchitis with IPF-much improved. He completed the prednisone  and the antibiotic. He is feeling a little bit more weak and tired today. So will have him take the prednisone 20 mg for 5 more days and then try going back down to 10 mg again. I had put him on a 5 day course of 40 mg.  Chronic steroid use - need to eval for DM. A1c looks great today. We'll continue to monitor periodically. Lab Results  Component Value Date   HGBA1C 5.2 01/15/2016   Swelling of lower extremities-okay to take a whole tab of the furosemide as needed but continue with half a tab every other day for maintenance.

## 2016-01-22 ENCOUNTER — Encounter (HOSPITAL_BASED_OUTPATIENT_CLINIC_OR_DEPARTMENT_OTHER): Payer: Self-pay | Admitting: *Deleted

## 2016-01-22 ENCOUNTER — Inpatient Hospital Stay (HOSPITAL_BASED_OUTPATIENT_CLINIC_OR_DEPARTMENT_OTHER)
Admission: EM | Admit: 2016-01-22 | Discharge: 2016-01-28 | DRG: 253 | Disposition: A | Discharge status: Home / Self Care | Payer: Medicare Other | Attending: Vascular Surgery | Admitting: Vascular Surgery

## 2016-01-22 ENCOUNTER — Other Ambulatory Visit: Payer: Self-pay

## 2016-01-22 DIAGNOSIS — Z953 Presence of xenogenic heart valve: Secondary | ICD-10-CM | POA: Diagnosis not present

## 2016-01-22 DIAGNOSIS — J8410 Pulmonary fibrosis, unspecified: Secondary | ICD-10-CM | POA: Diagnosis present

## 2016-01-22 DIAGNOSIS — I10 Essential (primary) hypertension: Secondary | ICD-10-CM | POA: Diagnosis present

## 2016-01-22 DIAGNOSIS — M7989 Other specified soft tissue disorders: Secondary | ICD-10-CM | POA: Diagnosis not present

## 2016-01-22 DIAGNOSIS — D509 Iron deficiency anemia, unspecified: Secondary | ICD-10-CM | POA: Diagnosis present

## 2016-01-22 DIAGNOSIS — K219 Gastro-esophageal reflux disease without esophagitis: Secondary | ICD-10-CM | POA: Diagnosis present

## 2016-01-22 DIAGNOSIS — I7581 Atheroembolism of kidney: Secondary | ICD-10-CM

## 2016-01-22 DIAGNOSIS — Z79899 Other long term (current) drug therapy: Secondary | ICD-10-CM | POA: Diagnosis not present

## 2016-01-22 DIAGNOSIS — I481 Persistent atrial fibrillation: Secondary | ICD-10-CM | POA: Diagnosis present

## 2016-01-22 DIAGNOSIS — N400 Enlarged prostate without lower urinary tract symptoms: Secondary | ICD-10-CM | POA: Diagnosis present

## 2016-01-22 DIAGNOSIS — I743 Embolism and thrombosis of arteries of the lower extremities: Secondary | ICD-10-CM | POA: Diagnosis not present

## 2016-01-22 DIAGNOSIS — H9190 Unspecified hearing loss, unspecified ear: Secondary | ICD-10-CM | POA: Diagnosis present

## 2016-01-22 DIAGNOSIS — Z9981 Dependence on supplemental oxygen: Secondary | ICD-10-CM | POA: Diagnosis not present

## 2016-01-22 DIAGNOSIS — I6523 Occlusion and stenosis of bilateral carotid arteries: Secondary | ICD-10-CM | POA: Diagnosis present

## 2016-01-22 DIAGNOSIS — L03115 Cellulitis of right lower limb: Secondary | ICD-10-CM | POA: Diagnosis present

## 2016-01-22 DIAGNOSIS — I4891 Unspecified atrial fibrillation: Secondary | ICD-10-CM | POA: Diagnosis not present

## 2016-01-22 DIAGNOSIS — E785 Hyperlipidemia, unspecified: Secondary | ICD-10-CM | POA: Diagnosis present

## 2016-01-22 DIAGNOSIS — I739 Peripheral vascular disease, unspecified: Secondary | ICD-10-CM | POA: Diagnosis not present

## 2016-01-22 DIAGNOSIS — Z7901 Long term (current) use of anticoagulants: Secondary | ICD-10-CM

## 2016-01-22 DIAGNOSIS — L819 Disorder of pigmentation, unspecified: Secondary | ICD-10-CM

## 2016-01-22 DIAGNOSIS — Z01818 Encounter for other preprocedural examination: Secondary | ICD-10-CM | POA: Diagnosis not present

## 2016-01-22 DIAGNOSIS — I6521 Occlusion and stenosis of right carotid artery: Secondary | ICD-10-CM | POA: Diagnosis not present

## 2016-01-22 DIAGNOSIS — I714 Abdominal aortic aneurysm, without rupture: Secondary | ICD-10-CM | POA: Diagnosis present

## 2016-01-22 DIAGNOSIS — Z952 Presence of prosthetic heart valve: Secondary | ICD-10-CM | POA: Diagnosis not present

## 2016-01-22 DIAGNOSIS — I82441 Acute embolism and thrombosis of right tibial vein: Secondary | ICD-10-CM | POA: Diagnosis not present

## 2016-01-22 DIAGNOSIS — N289 Disorder of kidney and ureter, unspecified: Secondary | ICD-10-CM | POA: Diagnosis present

## 2016-01-22 DIAGNOSIS — Z87891 Personal history of nicotine dependence: Secondary | ICD-10-CM

## 2016-01-22 DIAGNOSIS — M79604 Pain in right leg: Secondary | ICD-10-CM

## 2016-01-22 DIAGNOSIS — J9611 Chronic respiratory failure with hypoxia: Secondary | ICD-10-CM

## 2016-01-22 DIAGNOSIS — D696 Thrombocytopenia, unspecified: Secondary | ICD-10-CM | POA: Diagnosis not present

## 2016-01-22 DIAGNOSIS — I998 Other disorder of circulatory system: Secondary | ICD-10-CM | POA: Diagnosis not present

## 2016-01-22 HISTORY — DX: Unspecified atrial fibrillation: I48.91

## 2016-01-22 HISTORY — DX: Iron deficiency anemia, unspecified: D50.9

## 2016-01-22 HISTORY — DX: Peripheral vascular disease, unspecified: I73.9

## 2016-01-22 HISTORY — DX: Unspecified osteoarthritis, unspecified site: M19.90

## 2016-01-22 HISTORY — DX: Personal history of other diseases of the digestive system: Z87.19

## 2016-01-22 HISTORY — DX: Dependence on supplemental oxygen: Z99.81

## 2016-01-22 HISTORY — DX: Cardiac murmur, unspecified: R01.1

## 2016-01-22 LAB — CBC WITH DIFFERENTIAL/PLATELET
BAND NEUTROPHILS: 1 %
BASOS ABS: 0 10*3/uL (ref 0.0–0.1)
BASOS PCT: 0 %
BLASTS: 0 %
EOS ABS: 0 10*3/uL (ref 0.0–0.7)
Eosinophils Relative: 0 %
HEMATOCRIT: 32 % — AB (ref 39.0–52.0)
HEMOGLOBIN: 10.1 g/dL — AB (ref 13.0–17.0)
Lymphocytes Relative: 5 %
Lymphs Abs: 1.1 10*3/uL (ref 0.7–4.0)
MCH: 26.8 pg (ref 26.0–34.0)
MCHC: 31.6 g/dL (ref 30.0–36.0)
MCV: 84.9 fL (ref 78.0–100.0)
METAMYELOCYTES PCT: 0 %
MYELOCYTES: 1 %
Monocytes Absolute: 1.1 10*3/uL — ABNORMAL HIGH (ref 0.1–1.0)
Monocytes Relative: 5 %
Neutro Abs: 18.9 10*3/uL — ABNORMAL HIGH (ref 1.7–7.7)
Neutrophils Relative %: 88 %
PROMYELOCYTES ABS: 0 %
Platelets: 78 10*3/uL — ABNORMAL LOW (ref 150–400)
RBC: 3.77 MIL/uL — ABNORMAL LOW (ref 4.22–5.81)
RDW: 18.1 % — ABNORMAL HIGH (ref 11.5–15.5)
WBC: 21.1 10*3/uL — ABNORMAL HIGH (ref 4.0–10.5)
nRBC: 0 /100 WBC

## 2016-01-22 LAB — PROTIME-INR
INR: 1.28
Prothrombin Time: 16.1 seconds — ABNORMAL HIGH (ref 11.4–15.2)

## 2016-01-22 LAB — COMPREHENSIVE METABOLIC PANEL
ALBUMIN: 4.2 g/dL (ref 3.5–5.0)
ALK PHOS: 56 U/L (ref 38–126)
ALT: 17 U/L (ref 17–63)
AST: 17 U/L (ref 15–41)
Anion gap: 8 (ref 5–15)
BILIRUBIN TOTAL: 0.8 mg/dL (ref 0.3–1.2)
BUN: 32 mg/dL — AB (ref 6–20)
CALCIUM: 9.2 mg/dL (ref 8.9–10.3)
CO2: 35 mmol/L — AB (ref 22–32)
Chloride: 97 mmol/L — ABNORMAL LOW (ref 101–111)
Creatinine, Ser: 1.21 mg/dL (ref 0.61–1.24)
GFR calc Af Amer: >60 mL/min (ref >60)
GFR calc non Af Amer: 55 mL/min — ABNORMAL LOW (ref >60)
GLUCOSE: 107 mg/dL — AB (ref 65–99)
POTASSIUM: 5.1 mmol/L (ref 3.5–5.1)
SODIUM: 140 mmol/L (ref 135–145)
TOTAL PROTEIN: 6.9 g/dL (ref 6.5–8.1)

## 2016-01-22 MED ORDER — MIDODRINE HCL 5 MG PO TABS
2.5000 mg | ORAL_TABLET | Freq: Two times a day (BID) | ORAL | Status: DC
  Administered 2016-01-23 – 2016-01-26 (×7): 2.5 mg via ORAL
  Filled 2016-01-22 (×8): qty 1

## 2016-01-22 MED ORDER — PRAVASTATIN SODIUM 40 MG PO TABS
40.0000 mg | ORAL_TABLET | Freq: Every day | ORAL | Status: DC
  Administered 2016-01-23 – 2016-01-26 (×4): 40 mg via ORAL
  Filled 2016-01-22 (×4): qty 1

## 2016-01-22 MED ORDER — FINASTERIDE 5 MG PO TABS
5.0000 mg | ORAL_TABLET | Freq: Every day | ORAL | Status: DC
  Administered 2016-01-23 – 2016-01-28 (×5): 5 mg via ORAL
  Filled 2016-01-22 (×5): qty 1

## 2016-01-22 MED ORDER — PRAVASTATIN SODIUM 40 MG PO TABS: ORAL_TABLET | ORAL | 3 refills | Status: DC

## 2016-01-22 MED ORDER — PANTOPRAZOLE SODIUM 40 MG PO TBEC
40.0000 mg | DELAYED_RELEASE_TABLET | Freq: Two times a day (BID) | ORAL | Status: DC
  Administered 2016-01-23 – 2016-01-26 (×8): 40 mg via ORAL
  Filled 2016-01-22 (×8): qty 1

## 2016-01-22 MED ORDER — LORATADINE 10 MG PO TABS
10.0000 mg | ORAL_TABLET | Freq: Every morning | ORAL | Status: DC
  Administered 2016-01-23 – 2016-01-26 (×4): 10 mg via ORAL
  Filled 2016-01-22 (×4): qty 1

## 2016-01-22 MED ORDER — PREDNISONE 20 MG PO TABS
20.0000 mg | ORAL_TABLET | Freq: Every day | ORAL | Status: DC
  Administered 2016-01-23 – 2016-01-26 (×4): 20 mg via ORAL
  Filled 2016-01-22 (×4): qty 1

## 2016-01-22 MED ORDER — ADULT MULTIVITAMIN W/MINERALS CH
1.0000 | ORAL_TABLET | Freq: Every day | ORAL | Status: DC
  Administered 2016-01-23 – 2016-01-26 (×4): 1 via ORAL
  Filled 2016-01-22 (×4): qty 1

## 2016-01-22 MED ORDER — FUROSEMIDE 20 MG PO TABS
20.0000 mg | ORAL_TABLET | ORAL | Status: DC
  Administered 2016-01-23 – 2016-01-25 (×2): 20 mg via ORAL
  Filled 2016-01-22 (×2): qty 1

## 2016-01-22 NOTE — ED Notes (Signed)
Report given to Tiffany with [facility]. [facility] in route to transport pt now.

## 2016-01-22 NOTE — Consult Note (Signed)
Patient name: Roy BundeRobert G Chan MRN: [id number] DOB: 12/29/1934 Sex: male  REASON FOR CONSULT: Ischemic toes both feet and pain in both feet (right greater than left). Consult [facility].  HPI: [name] is a 81 y.o. male, who has been followed in our office by Dr. Arbie CookeyEarly in the past with a small aneurysm and bilateral carotid disease. He noted the gradual onset of pain in both feet about a month ago. Approximately a week ago he began complaining of increasing pain in the right foot and the left foot. The symptoms were more significant on the right side. He also noted discoloration of his toes. He was sent for vascular evaluation.  I do not get any history of claudication prior to this however, his activity is very limited. According to his wife he walks from the bed to the bathroom to the chair essentially. I do not get any history of rest pain. He denies any history of nonhealing ulcers.  He has a complicated medical history. He is on home O2 because of pulmonary fibrosis. He had an aortic valve replacement for aortic stenosis in 2011. This was a pericardial tissue valve. He developed atrial fibrillation this year and is now 1 Eliquis (5 mg by mouth twice a day).  He had an ultrasound of his abdomen in August 2017 which shows a 3 cm infrarenal abdominal aortic aneurysm.  He had a carotid duplex scan in August 2017 which shows a 60-79% right carotid stenosis with a 40-59% left carotid stenosis.  Past Medical History:  Diagnosis Date  . AAA (abdominal aortic aneurysm) (HCC)   . Aortic stenosis    AVR 2011  . Cleft palate   . Dermatitis    poison ivy  . GERD (gastroesophageal reflux disease)   . Hyperlipidemia   . Hypertension   . Interstitial lung disease (HCC)   . Pulmonary fibrosis (HCC)   . Syncope and collapse 07/18/2014    Family History  Problem Relation Age of Onset  . Heart attack Mother   . Cancer Father     lung and stomach  . Heart attack  Father   . Cancer Sister     bone  . Diabetes Sister   . Heart murmur Brother   . Hypertension Brother   . Hyperlipidemia Brother   . Heart attack Brother     SOCIAL HISTORY: He quit tobacco in 1979. Social History   Social History  . Marital status: Married    Spouse name: N/A  . Number of children: N/A  . Years of education: N/A   Occupational History  . Retired Retired   Social History Main Topics  . Smoking status: Former Smoker    Packs/day: 1.50    Years: 5.00    Types: Cigarettes, Cigars    Quit date: 09/08/1977  . Smokeless tobacco: Never Used  . Alcohol use No  . Drug use: No  . Sexual activity: Not on file   Other Topics Concern  . Not on file   Social History Narrative  . No narrative on file    Allergies  Allergen Reactions  . Pravastatin Sodium Other (See Comments)    Sneezing episodes, can take medication and tolerates reaction.  . Pirfenidone Nausea And Vomiting    No current facility-administered medications for this encounter.    Current Outpatient Prescriptions  Medication Sig Dispense Refill  . AMBULATORY NON FORMULARY MEDICATION Rollator rolling walker with seat.  Diagnosis Physical Deconditioning  799.3 1 Units 0  . apixaban (ELIQUIS) 5 MG TABS tablet Take 1 tablet (5 mg total) by mouth 2 (two) times daily. 60 tablet 11  . finasteride (PROSCAR) 5 MG tablet Take 1 tablet by mouth  daily 90 tablet 0  . furosemide (LASIX) 20 MG tablet Take 1 tablet (20 mg total) by mouth every other day. 45 tablet 3  . ketoconazole (NIZORAL) 2 % cream Apply 1 application topically 2 (two) times daily. To affected areas. 60 g 3  . loratadine (CLARITIN) 10 MG tablet Take 10 mg by mouth every morning.     . midodrine (PROAMATINE) 2.5 MG tablet Take 1 tablet (2.5 mg total) by mouth 2 (two) times daily with a meal. 180 tablet 0  . Multiple Vitamin (MULTIVITAMIN) tablet Take 1 tablet by mouth daily.    . NON FORMULARY Place 4 L into the nose daily. 6 liters and  with activity 8 liters    . pantoprazole (PROTONIX) 40 MG tablet TAKE 1 TABLET BY MOUTH TWICE DAILY BEFORE MEALS 180 tablet 0  . pravastatin (PRAVACHOL) 40 MG tablet Take 1 tablet by mouth at  bedtime 90 tablet 3  . predniSONE (DELTASONE) 10 MG tablet Take 2 tablets (20 mg total) by mouth daily with breakfast. X 5 days 10 tablet 0   Facility-Administered Medications Ordered in Other Encounters  Medication Dose Route Frequency Provider Last Rate Last Dose  . iron dextran complex (INFED) 25 mg in sodium chloride 0.9 % 500 mL IVPB  25 mg Intravenous Once Sean Hommel, DO       Followed by  . iron dextran complex (INFED) 1,000 mg in sodium chloride 0.9 % 500 mL IVPB  1,000 mg Intravenous Once [employer], DO        REVIEW OF SYSTEMS:  [X]  denotes positive finding, [ ]  denotes negative finding Cardiac  Comments:  Chest pain or chest pressure:    Shortness of breath upon exertion: X   Short of breath when lying flat: X   Irregular heart rhythm: X A. fib      Vascular    Pain in calf, thigh, or hip brought on by ambulation:    Pain in feet at night that wakes you up from your sleep:     Blood clot in your veins:    Leg swelling:         Pulmonary    Oxygen at home: X   Productive cough:     Wheezing:         Neurologic    Sudden weakness in arms or legs:     Sudden numbness in arms or legs:     Sudden onset of difficulty speaking or slurred speech:    Temporary loss of vision in one eye:     Problems with dizziness:         Gastrointestinal    Blood in stool:     Vomited blood:         Genitourinary    Burning when urinating:     Blood in urine:        Psychiatric    Major depression:         Hematologic    Bleeding problems:    Problems with blood clotting too easily:        Skin    Rashes or ulcers:        Constitutional    Fever or chills:      PHYSICAL EXAM: Vitals:  01/22/16 1738 01/22/16 1900 01/22/16 1944 01/22/16 2045  BP: 113/76 123/70 132/72 130/81    Pulse: 97  102 100  Resp: 20 24 20    Temp: 97.9 F (36.6 C)  97.9 F (36.6 C)   TempSrc: Oral  Oral   SpO2: 98%  100% 100%  Weight:      Height:        GENERAL: The patient is a well-nourished male, in no acute distress. The vital signs are documented above. CARDIAC: There is a regular rate and rhythm.  VASCULAR: I do not detect carotid bruits. On the right side, which is the more symptomatic side, he has a palpable femoral pulse. I cannot palpate a popliteal or pedal pulses. He does have a monophasic anterior tibial and peroneal signal with Doppler on the right. I cannot obtain a posterior tibial signal. On the left side, he has a palpable femoral pulse and palpable popliteal pulse. He has a brisk posterior tibial signal with the Doppler and a monophasic dorsalis pedis signal. He has mild bilateral lower extremity swelling. PULMONARY: There is good air exchange bilaterally without wheezing or rales. ABDOMEN: Soft and non-tender with normal pitched bowel sounds. His aneurysm is nontender. MUSCULOSKELETAL: There are no major deformities. NEUROLOGIC: No focal weakness or paresthesias are detected. SKIN: He has mottling of his toes on both feet. The foot itself appears adequately perfused. PSYCHIATRIC: The patient has a normal affect.  DATA:   His platelet count is 78,000. GFR is 55. Creatinine is 1.2. White blood cell count is 21,000.  Duplex of his abdominal aorta in August 2017 shows a 3 cm infrarenal abdominal aortic aneurysm.  Carotid duplex scan in August 2017 shows a 60-79% right carotid stenosis with a 40-59% left carotid stenosis.  MEDICAL ISSUES:  ISCHEMIC TOES BILATERALLY: This patient has ischemic toes bilaterally and also increasing pain in the right foot which is been going on for a month but has been worse over the last week. He does have a history of atrial fibrillation but is on Eliquis. He took his last dose at 5 PM today. Given that he is on Eliquis I think it is  unlikely that he had a major embolus although this is possible. 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 We would need to stop his Eliquis for 48 hours before considering surgery or arteriography. This reason I would recommend getting a CT angiogram of the aorta with runoff to start. In addition he will need cardiac evaluation in the event that he might need surgery. I'm also concerned about his thrombocytopenia which further complicates this situation. In addition he is quite frail with pulmonary fibrosis requiring continuous home O2. I'll make further recommendations pending the results of his CT arteriogram. I will consult cardiology in the morning. He may potentially or car hematology to evaluate his thrombocytopenia.   Waverly Ferrariickson, Zohal Reny [facility] [place] [facility] [phone]

## 2016-01-22 NOTE — ED Notes (Signed)
[facility] called again to stress the urgency to have pt transported

## 2016-01-22 NOTE — ED Notes (Signed)
Attempted report x1. 

## 2016-01-22 NOTE — ED Provider Notes (Signed)
[facility] Provider Note   CSN: [id number] Arrival date & time: 01/22/16  1732  By signing my name below, I, Linna DarnerRussell Turner, attest that this documentation has been prepared under the direction and in the presence of physician practitioner, Marily MemosJason Roberto Romanoski, MD. Electronically Signed: Linna Darnerussell Turner, Scribe. 01/22/2016. 6:18 PM.  History   Chief Complaint Chief Complaint  Patient presents with  . Foot Pain    The history is provided by the patient. No language interpreter was used.     HPI Comments: [name] is a 81 y.o. male with extensive PMHx who presents to the Emergency Department complaining of constant, throbbing, bilateral foot pain R > L for the last several days. He reports that all of his toes are cold and it "feels like frostbite." He reports some blue/purple discoloration to his 3rd and 4th toes on the left as well. No recent falls or trauma to his feet. He notes a h/o similar symptoms one year ago but notes he did not have pain in his feet at this time; he notes elevating his feet and wearing support stockings alleviated this episode of coldness and discoloration. He is anticoagulated with Eliquis and has not missed any recent doses. Wife reports pt has been on steroids intermittently for four years due to his h/o pulmonary fibrosis, and today he was started on 10 mg prednisone daily. Wife reports pt developed A-Fib this past September. Pt has a PSHx of heart valve replacement but no other cardiac surgeries. He has been told he is not a candidate for orthopedic surgeries due to his multiple comorbidities. He denies SOB, CP, abdominal pain, back pain, numbness, neuro deficits, or any other associated symptoms.  Past Medical History:  Diagnosis Date  . AAA (abdominal aortic aneurysm) (HCC)   . Aortic stenosis    AVR 2011  . Cleft palate   . Dermatitis    poison ivy  . GERD (gastroesophageal reflux disease)   . Hyperlipidemia   . Hypertension   .  Interstitial lung disease (HCC)   . Pulmonary fibrosis (HCC)   . Syncope and collapse 07/18/2014    Patient Active Problem List   Diagnosis Date Noted  . Acute heart failure (HCC) 02/07/2015  . Closed fracture of left proximal humerus 01/29/2015  . PAD (peripheral artery disease) (HCC) 01/05/2015  . Discoloration of skin of foot 12/26/2014  . Swelling of lower extremity 12/26/2014  . Chronic hypoxemic respiratory failure (HCC) 12/16/2014  . IPF (idiopathic pulmonary fibrosis) (HCC) 12/16/2014  . BPH (benign prostatic hyperplasia) 10/02/2014  . AAA (abdominal aortic aneurysm) without rupture (HCC) 09/11/2013  . Physical deconditioning 08/14/2013  . Bladder mass 08/01/2013  . Abdominal aortic aneurysm (HCC) 08/01/2013  . Orthostatic hypotension 07/17/2013  . Anemia 07/17/2013  . Interstitial pulmonary fibrosis (HCC) 07/17/2013  . GERD (gastroesophageal reflux disease) 07/20/2012  . BOOP (bronchiolitis obliterans with organizing pneumonia) (HCC) 06/15/2012  . Wrist pain 06/09/2012  . Allergic conjunctivitis 12/14/2011  . Hearing loss of both ears 01/25/2011  . 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 PAROXYSMAL ATRIAL FIBRILLATION 11/05/2009  . H/O aortic valve replacement 11/05/2009  . OTHER ABNORMAL GLUCOSE 01/14/2009  . Hyperlipidemia 08/08/2007  . Aortic valve disorder 08/07/2007    Past Surgical History:  Procedure Laterality Date  . 7 surgeries for cleft palate  1938  . aortic valve replacement w/ pericardial tissue valve  10-15-09   Dr Barry Dieneswens  . bilat cataract sx  2004  . CHOLECYSTECTOMY  1999  . EYE SURGERY    .  oral surgery with bone graft from hip for cleft palate  1989  . right groin hernia repair  1950's  . rotator cuff tear, left shoulder         Home Medications    Prior to Admission medications   Medication Sig Start Date End Date Taking? Authorizing Provider  AMBULATORY NON FORMULARY MEDICATION Rollator rolling walker with seat.  Diagnosis Physical  Deconditioning 799.3 08/14/13   Laren Boom, DO  apixaban (ELIQUIS) 5 MG TABS tablet Take 1 tablet (5 mg total) by mouth 2 (two) times daily. 10/21/15   Lewayne Bunting, MD  finasteride (PROSCAR) 5 MG tablet Take 1 tablet by mouth  daily 09/25/15   Laren Boom, DO  furosemide (LASIX) 20 MG tablet Take 1 tablet (20 mg total) by mouth every other day. 12/31/15   Lewayne Bunting, MD  ketoconazole (NIZORAL) 2 % cream Apply 1 application topically 2 (two) times daily. To affected areas. 03/27/15   Sean Hommel, DO  loratadine (CLARITIN) 10 MG tablet Take 10 mg by mouth every morning.     Historical Provider, MD  midodrine (PROAMATINE) 2.5 MG tablet Take 1 tablet (2.5 mg total) by mouth 2 (two) times daily with a meal. 01/09/16   Agapito Games, MD  Multiple Vitamin (MULTIVITAMIN) tablet Take 1 tablet by mouth daily.    Historical Provider, MD  NON FORMULARY Place 4 L into the nose daily. 6 liters and with activity 8 liters    Historical Provider, MD  pantoprazole (PROTONIX) 40 MG tablet TAKE 1 TABLET BY MOUTH TWICE DAILY BEFORE MEALS 01/16/16   Agapito Games, MD  pravastatin (PRAVACHOL) 40 MG tablet Take 1 tablet by mouth at  bedtime 01/22/16   Agapito Games, MD  predniSONE (DELTASONE) 10 MG tablet Take 2 tablets (20 mg total) by mouth daily with breakfast. X 5 days 01/15/16   Agapito Games, MD    Family History Family History  Problem Relation Age of Onset  . Heart attack Mother   . Cancer Father     lung and stomach  . Heart attack Father   . Cancer Sister     bone  . Diabetes Sister   . Heart murmur Brother   . Hypertension Brother   . Hyperlipidemia Brother   . Heart attack Brother     Social History Social History  Substance Use Topics  . Smoking status: Former Smoker    Packs/day: 1.50    Years: 5.00    Types: Cigarettes, Cigars    Quit date: 09/08/1977  . Smokeless tobacco: Never Used  . Alcohol use No     Allergies   Pravastatin sodium and  Pirfenidone   Review of Systems Review of Systems  Respiratory: Negative for shortness of breath.   Cardiovascular: Negative for chest pain.  Gastrointestinal: Negative for abdominal pain.  Musculoskeletal: Positive for myalgias (bilateral feet R > L). Negative for back pain.  Skin: Positive for color change (purple discoloration to 3rd and 4th left toes).  Neurological: Negative for numbness.  All other systems reviewed and are negative.    Physical Exam Updated Vital Signs BP 132/72 (BP Location: Left Arm)   Pulse 102 Comment: Afib  Temp 97.9 F (36.6 C) (Oral)   Resp 20   Ht 6\' 1"  (1.854 m)   Wt 207 lb (93.9 kg)   SpO2 100%   BMI 27.31 kg/m   Physical Exam  Constitutional: He is oriented to person, place, and time. He appears  well-developed and well-nourished. No distress.  HENT:  Head: Normocephalic and atraumatic.  Eyes: Conjunctivae and EOM are normal.  Neck: Neck supple. No tracheal deviation present.  Cardiovascular: Normal rate.  An irregularly irregular rhythm present.  Pulmonary/Chest: Effort normal. No respiratory distress.  Musculoskeletal: Normal range of motion.  No dopperable DP pulse on the right. Faint dopperable DP pulse on the left. Third and fourth toes on the left are purple. Right foot is colder than his left. Right foot is paler than his left. Both feet have significantly decreased capillary refill. Mild lower extremity pitting edema.  Neurological: He is alert and oriented to person, place, and time.  Skin: Skin is warm and dry.  Psychiatric: He has a normal mood and affect. His behavior is normal.  Nursing note and vitals reviewed.    ED Treatments / Results  Labs (all labs ordered are listed, but only abnormal results are displayed) Labs Reviewed  CBC WITH DIFFERENTIAL/PLATELET - Abnormal; Notable for the following:       Result Value   WBC 21.1 (*)    RBC 3.77 (*)    Hemoglobin 10.1 (*)    HCT 32.0 (*)    RDW 18.1 (*)     Platelets 78 (*)    Neutro Abs 18.9 (*)    Monocytes Absolute 1.1 (*)    All other components within normal limits  COMPREHENSIVE METABOLIC PANEL - Abnormal; Notable for the following:    Chloride 97 (*)    CO2 35 (*)    Glucose, Bld 107 (*)    BUN 32 (*)    GFR calc non Af Amer 55 (*)    All other components within normal limits  PROTIME-INR - Abnormal; Notable for the following:    Prothrombin Time 16.1 (*)    All other components within normal limits    EKG  EKG Interpretation None       Radiology No results found.  Procedures Procedures (including critical care time)  DIAGNOSTIC STUDIES: Oxygen Saturation is 98% on [place], normal by my interpretation.    COORDINATION OF CARE: 6:28 PM Discussed treatment plan with pt at bedside and pt agreed to plan.  Medications Ordered in ED Medications - No data to display   Initial Impression / Assessment and Plan / ED Course  I have reviewed the triage vital signs and the nursing notes.  Pertinent labs & imaging results that were available during my care of the patient were reviewed by me and considered in my medical decision making (see chart for details).  Clinical Course     Concern for ischemic right foot. Discussed with Dr. Durwin Noraixon with [facility] and requests immediate urgent transfer to that ER for evaluation. Did not want heparin as patient is therapeutic on eliquis.  [facility], accepts to ER for vascular consult. Plan for CCT.   Final Clinical Impressions(s) / ED Diagnoses   Final diagnoses:  Pain of right lower extremity    New Prescriptions New Prescriptions   No medications on file   I personally performed the services described in this documentation, which was scribed in my presence. The recorded information has been reviewed and is accurate.    Marily MemosJason Adron Geisel, MD 01/22/16 2022

## 2016-01-22 NOTE — ED Notes (Signed)
Vascular surgery at bedside.

## 2016-01-22 NOTE — ED Notes (Signed)
Dr. Floyd at bedside. 

## 2016-01-22 NOTE — ED Notes (Signed)
[facility] here for transport now. 

## 2016-01-22 NOTE — ED Provider Notes (Signed)
Patient transferred from [facility].  Some concern for ischemia to the R foot. No noted pulse found.  On my exam some mild purpulish discoloration to the mid foot and distal.  No palpable DP, PT.  Vascular notified of arrival.  Will admit.    Melene Planan Lois Ostrom, DO 01/22/16 2153

## 2016-01-22 NOTE — ED Triage Notes (Signed)
Pain in his feet. They feel cold to touch.

## 2016-01-23 ENCOUNTER — Inpatient Hospital Stay (HOSPITAL_COMMUNITY): Payer: Medicare Other

## 2016-01-23 ENCOUNTER — Encounter (HOSPITAL_COMMUNITY): Payer: Self-pay | Admitting: Radiology

## 2016-01-23 DIAGNOSIS — I998 Other disorder of circulatory system: Secondary | ICD-10-CM

## 2016-01-23 DIAGNOSIS — I4891 Unspecified atrial fibrillation: Secondary | ICD-10-CM

## 2016-01-23 DIAGNOSIS — Z952 Presence of prosthetic heart valve: Secondary | ICD-10-CM

## 2016-01-23 DIAGNOSIS — D696 Thrombocytopenia, unspecified: Secondary | ICD-10-CM

## 2016-01-23 DIAGNOSIS — Z954 Presence of other heart-valve replacement: Secondary | ICD-10-CM

## 2016-01-23 DIAGNOSIS — Z01818 Encounter for other preprocedural examination: Secondary | ICD-10-CM

## 2016-01-23 DIAGNOSIS — I739 Peripheral vascular disease, unspecified: Principal | ICD-10-CM

## 2016-01-23 DIAGNOSIS — I481 Persistent atrial fibrillation: Secondary | ICD-10-CM

## 2016-01-23 DIAGNOSIS — I7581 Atheroembolism of kidney: Secondary | ICD-10-CM

## 2016-01-23 LAB — BASIC METABOLIC PANEL
Anion gap: 11 (ref 5–15)
BUN: 27 mg/dL — AB (ref 6–20)
CALCIUM: 8.8 mg/dL — AB (ref 8.9–10.3)
CHLORIDE: 96 mmol/L — AB (ref 101–111)
CO2: 33 mmol/L — AB (ref 22–32)
CREATININE: 1.21 mg/dL (ref 0.61–1.24)
GFR calc non Af Amer: 55 mL/min — ABNORMAL LOW (ref >60)
Glucose, Bld: 100 mg/dL — ABNORMAL HIGH (ref 65–99)
Potassium: 4.1 mmol/L (ref 3.5–5.1)
SODIUM: 140 mmol/L (ref 135–145)

## 2016-01-23 LAB — CBC
HCT: 29.8 % — ABNORMAL LOW (ref 39.0–52.0)
Hemoglobin: 9.4 g/dL — ABNORMAL LOW (ref 13.0–17.0)
MCH: 26 pg (ref 26.0–34.0)
MCHC: 31.5 g/dL (ref 30.0–36.0)
MCV: 82.5 fL (ref 78.0–100.0)
PLATELETS: 107 10*3/uL — AB (ref 150–400)
RBC: 3.61 MIL/uL — AB (ref 4.22–5.81)
RDW: 18.3 % — AB (ref 11.5–15.5)
WBC: 17.2 10*3/uL — ABNORMAL HIGH (ref 4.0–10.5)

## 2016-01-23 LAB — PROTIME-INR
INR: 1.32
PROTHROMBIN TIME: 16.4 s — AB (ref 11.4–15.2)

## 2016-01-23 LAB — SAVE SMEAR

## 2016-01-23 LAB — URINALYSIS, ROUTINE W REFLEX MICROSCOPIC
BILIRUBIN URINE: NEGATIVE
Glucose, UA: NEGATIVE mg/dL
HGB URINE DIPSTICK: NEGATIVE
KETONES UR: NEGATIVE mg/dL
Leukocytes, UA: NEGATIVE
NITRITE: NEGATIVE
PH: 5 (ref 5.0–8.0)
Protein, ur: NEGATIVE mg/dL
Specific Gravity, Urine: 1.015 (ref 1.005–1.030)

## 2016-01-23 MED ORDER — ACETAMINOPHEN 325 MG PO TABS
325.0000 mg | ORAL_TABLET | ORAL | Status: DC | PRN
  Administered 2016-01-27: 650 mg via ORAL
  Filled 2016-01-23: qty 2

## 2016-01-23 MED ORDER — LABETALOL HCL 5 MG/ML IV SOLN: 10.0000 mg | INTRAVENOUS | Status: DC | PRN

## 2016-01-23 MED ORDER — HYDRALAZINE HCL 20 MG/ML IJ SOLN
5.0000 mg | INTRAMUSCULAR | Status: DC | PRN
Start: 2016-01-23 — End: 2016-01-27

## 2016-01-23 MED ORDER — POTASSIUM CHLORIDE CRYS ER 20 MEQ PO TBCR: 20.0000 meq | EXTENDED_RELEASE_TABLET | Freq: Once | ORAL | Status: DC

## 2016-01-23 MED ORDER — METOPROLOL TARTRATE 5 MG/5ML IV SOLN: 2.0000 mg | INTRAVENOUS | Status: DC | PRN

## 2016-01-23 MED ORDER — ONDANSETRON HCL 4 MG/2ML IJ SOLN: 4.0000 mg | Freq: Four times a day (QID) | INTRAMUSCULAR | Status: DC | PRN

## 2016-01-23 MED ORDER — OXYCODONE-ACETAMINOPHEN 5-325 MG PO TABS
1.0000 | ORAL_TABLET | ORAL | Status: DC | PRN
  Administered 2016-01-23 – 2016-01-24 (×2): 1 via ORAL
  Filled 2016-01-23 (×2): qty 1

## 2016-01-23 MED ORDER — IOPAMIDOL (ISOVUE-370) INJECTION 76%
INTRAVENOUS | Status: AC
  Administered 2016-01-23: 100 mL
  Filled 2016-01-23: qty 100

## 2016-01-23 MED ORDER — ALUM & MAG HYDROXIDE-SIMETH 200-200-20 MG/5ML PO SUSP: 15.0000 mL | ORAL | Status: DC | PRN

## 2016-01-23 MED ORDER — PHENOL 1.4 % MT LIQD: 1.0000 | OROMUCOSAL | Status: DC | PRN

## 2016-01-23 MED ORDER — ACETAMINOPHEN 325 MG RE SUPP: 325.0000 mg | RECTAL | Status: DC | PRN

## 2016-01-23 MED ORDER — GUAIFENESIN-DM 100-10 MG/5ML PO SYRP: 15.0000 mL | ORAL_SOLUTION | ORAL | Status: DC | PRN

## 2016-01-23 MED ORDER — ENOXAPARIN SODIUM 40 MG/0.4ML ~~LOC~~ SOLN
40.0000 mg | SUBCUTANEOUS | Status: DC
  Administered 2016-01-23: 40 mg via SUBCUTANEOUS
  Filled 2016-01-23: qty 0.4

## 2016-01-23 MED ORDER — IOPAMIDOL (ISOVUE-370) INJECTION 76%
INTRAVENOUS | Status: AC
  Administered 2016-01-23: 100 mL via INTRAVENOUS
  Filled 2016-01-23: qty 100

## 2016-01-23 NOTE — Progress Notes (Signed)
VASCULAR SURGERY ADDENDUM:  I reviewed the CT scan images. On my review, he appears to have some disease in the right superficial femoral artery at the adductor canal with a short segment occlusion here. 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 He likely had focal disease in this area that went on to thrombose. His Eliquis is on hold. I tentatively plan to proceed with an arteriogram on Tuesday to see if potentially this is something that could be addressed with angioplasty and stenting.  Cardiology has been consult it. In addition I consult hematology because of his initial thrombocytopenia. If it's okay with them and we will start heparin given that his Eliquis is on hold. I'll make further recommendations pending the results of his arteriogram on Tuesday.  Waverly Ferrarihristopher Dickson, MD, FACS Beeper [phone] Office: [phone]

## 2016-01-23 NOTE — Progress Notes (Signed)
   VASCULAR SURGERY ASSESSMENT & PLAN:  Both feet look better. Toes have improved.  CT Angio pending.   Needs Cardiology consult (Primary Cardiologist is Dr. Jens Somrenshaw), in case he needs any intervention.  Eliquis on hold. Would like to start heparin, but had significant thrombocytopenia on exam. Needs Hematology consult.   Thrombocytopenia improved.    SUBJECTIVE: Feet feel a little better   PHYSICAL EXAM: Vitals:   01/22/16 2315 01/22/16 2330 01/23/16 0004 01/23/16 0509  BP: 123/81 106/73 124/69 103/61  Pulse: 97 93 (!) 102 85  Resp:   20 20  Temp:   98.3 F (36.8 C) 97.6 F (36.4 C)  TempSrc:   Oral Oral  SpO2: 97% 100% 100% 100%  Weight:   196 lb 12.8 oz (89.3 kg) 194 lb 9.6 oz (88.3 kg)  Height:   6\' 1"  (1.854 m)    Doppler right peroneal > ATA > DP  Doppler left DP and PT  (brisk)  LABS: Lab Results  Component Value Date   WBC 17.2 (H) 01/23/2016   HGB 9.4 (L) 01/23/2016   HCT 29.8 (L) 01/23/2016   MCV 82.5 01/23/2016   PLT 107 (L) 01/23/2016   Lab Results  Component Value Date   CREATININE 1.21 01/23/2016    Active Problems:   PVD (peripheral vascular disease) (HCC)   Roy Carawayhris Rabon Scholle Beeper: [phone]: [phone] 01/23/2016

## 2016-01-23 NOTE — Progress Notes (Signed)
Patient transferred from 2W18 to 2W38 without complication.

## 2016-01-23 NOTE — Consult Note (Signed)
Cardiology Consult    Patient ID: [name] MRN: [id number], DOB/AGE: 05/26/1934   Admit date: 01/22/2016 Date of Consult: 01/23/2016  Primary Physician: [name], MD Primary Cardiologist: Dr. Jens Som Requesting Provider: Dr. Edilia Bo Reason for Consultation: Pre op Clearance   Patient Profile    81 yo male with PMH of AAA, AVR, HTN, pulmonary fibrosis, and interstitial lung disease who presented with bilateral foot pain.   Past Medical History   Past Medical History:  Diagnosis Date  . AAA (abdominal aortic aneurysm) (HCC)   . Aortic stenosis    AVR 2011  . Cleft palate   . Dermatitis    poison ivy  . GERD (gastroesophageal reflux disease)   . Hyperlipidemia   . Hypertension   . Interstitial lung disease (HCC)   . Pulmonary fibrosis (HCC)   . Syncope and collapse 07/18/2014    Past Surgical History:  Procedure Laterality Date  . 7 surgeries for cleft palate  1938  . aortic valve replacement w/ pericardial tissue valve  10-15-09   Dr Barry Dienes  . bilat cataract sx  2004  . CHOLECYSTECTOMY  1999  . EYE SURGERY    . oral surgery with bone graft from hip for cleft palate  1989  . right groin hernia repair  1950's  . rotator cuff tear, left shoulder       Allergies  Allergies  Allergen Reactions  . Pravastatin Sodium Other (See Comments)    Sneezing episodes, can take medication and tolerates reaction.  . Pirfenidone Nausea And Vomiting    History of Present Illness    [name] is a 81 yo male with PMH of  AAA, AVR, HTN, pulmonary fibrosis, and interstitial lung disease. Cardiac catheterization in Sept 2011 revealed nonobstructive coronary disease, normal LV function and moderately severe aortic stenosis with a mean gradient of 34 mm of mercury. The patient then underwent aortic valve replacement on October 15, 2009 with a pericardial tissue valve. Echo 1/17 showed normal LV function, normal bioprosthetic valve. Ab ultrasound 8/17showed  abdominal aortic aneurysm of 3 x 2.8 cm. Carotid dopplers 8/17 showed 60-79 R and 40-59 L. PT also with ILD and BOOP. Seen recently and found to have new onset atrial fibrillation. Pt has chronic anemia. Holter 9/17 showed afib rate controlled. Placed on apixaban. Noted to have a AAA measuring 3.0cm at his last office visit with [facility]. Carotid duplex scan in August 2017 which shows a 60-79% right carotid stenosis with a 40-59% left carotid stenosis. He was last seen in the office by Dr. Jens Som on 12/31/15 and reported doing well.   Reports he normal requires home O2 at 6L given his underlying lung disease. Has a one story home, and is ambulatory around the house without any anginal symptoms. Reports he has been in his usual state of health up a few days prior to admission. He began to experience right and left lower extremity pain and stated his right foot was cool to touch.   In the ED his labs showed stable electrolytes, Hgb 10.1>>9.4, Cr 1.2, WBC 21.1>>17.2, Platelet 78>>107. 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 Planned to hold his Eliquis with the possibility of surgery. They recommended a CT angiogram for further work up. Cardiology was called for pre op clearance.   Inpatient Medications    . enoxaparin (LOVENOX) injection  40 mg Subcutaneous Q24H  . finasteride  5 mg Oral Daily  . furosemide  20 mg Oral QODAY  . loratadine  10 mg Oral q morning - 10a  . midodrine  2.5 mg Oral BID WC  . multivitamin with minerals  1 tablet Oral Daily  . pantoprazole  40 mg Oral BID AC  . potassium chloride  20-40 mEq Oral Once  . pravastatin  40 mg Oral QHS  . predniSONE  20 mg Oral Q breakfast    Family History    Family History  Problem Relation Age of Onset  . Heart attack Mother   . Cancer Father     lung and stomach  . Heart attack Father   . Cancer Sister     bone  . Diabetes Sister   . Heart murmur Brother   . Hypertension Brother   .  Hyperlipidemia Brother   . Heart attack Brother     Social History    Social History   Social History  . Marital status: Married    Spouse name: N/A  . Number of children: N/A  . Years of education: N/A   Occupational History  . Retired Retired   Social History Main Topics  . Smoking status: Former Smoker    Packs/day: 1.50    Years: 5.00    Types: Cigarettes, Cigars    Quit date: 09/08/1977  . Smokeless tobacco: Never Used  . Alcohol use No  . Drug use: No  . Sexual activity: Not on file   Other Topics Concern  . Not on file   Social History Narrative  . No narrative on file     Review of Systems    General:  No chills, fever, night sweats or weight changes.  Cardiovascular:  No chest pain, dyspnea on exertion, edema, orthopnea, palpitations, paroxysmal nocturnal dyspnea. Dermatological: No rash, lesions/masses Respiratory: No cough, dyspnea Urologic: No hematuria, dysuria Abdominal:   No nausea, vomiting, diarrhea, bright red blood per rectum, melena, or hematemesis Neurologic:  No visual changes, wkns, changes in mental status. All other systems reviewed and are otherwise negative except as noted above.  Physical Exam    Blood pressure 120/65, pulse (!) 103, temperature 97.4 F (36.3 C), temperature source Oral, resp. rate 20, height 6\' 1"  (1.854 m), weight 194 lb 9.6 oz (88.3 kg), SpO2 100 %.  General: Pleasant frail older male, NAD, wearing [place]. Psych: Normal affect. Neuro: Alert and oriented X 3. Moves all extremities spontaneously. HEENT: Normal  Neck: Supple without bruits or JVD. Lungs:  Resp regular and unlabored, mild rales bilaterally. Heart: RRR no s3, s4, or murmurs, crisp valve click. Abdomen: Soft, non-tender, non-distended, BS + x 4.  Extremities: No clubbing, cyanosis or edema. Unable to palpate bilateral DP pulses, but found by doppler. Right foot cooler than left.   Labs    Troponin (Point of Care Test) No results for input(s): TROPIPOC  in the last 72 hours. No results for input(s): CKTOTAL, CKMB, TROPONINI in the last 72 hours. Lab Results  Component Value Date   WBC 17.2 (H) 01/23/2016   HGB 9.4 (L) 01/23/2016   HCT 29.8 (L) 01/23/2016   MCV 82.5 01/23/2016   PLT 107 (L) 01/23/2016    Recent Labs Lab 01/22/16 1836 01/23/16 0202  NA 140 140  K 5.1 4.1  CL 97* 96*  CO2 35* 33*  BUN 32* 27*  CREATININE 1.21 1.21  CALCIUM 9.2 8.8*  PROT 6.9  --   BILITOT 0.8  --   ALKPHOS 56  --   ALT 17  --  AST 17  --   GLUCOSE 107* 100*   Lab Results  Component Value Date   CHOL 172 12/05/2013   HDL 41.50 12/05/2013   LDLCALC 100 (H) 12/05/2013   TRIG 152.0 (H) 12/05/2013   No results found for: [facility]   Radiology Studies    Dg Chest 2 View  Result Date: 01/06/2016 CLINICAL DATA:  Idiopathic pulmonary fibrosis.  Acute bronchitis EXAM: CHEST  2 VIEW COMPARISON:  09/26/2014, 06/17/2015 FINDINGS: Markedly increased interstitial lung markings bilaterally are stable and consistent with pulmonary fibrosis. Markings are more prominent on the right than the left. No superimposed infiltrate or effusion. Aortic valve replacement. Negative for heart failure or edema. IMPRESSION: Severe pulmonary fibrosis without interval change from prior studies. Electronically Signed   By: Marlan Palau M.D.   On: 01/06/2016 06:30    ECG & Cardiac Imaging    EKG: AF  Echo: 02/12/15  Study Conclusions  - Left ventricle: The cavity size was normal. Wall thickness was   normal. Systolic function was normal. The estimated ejection   fraction was in the range of 50% to 55%. Wall motion was normal;   there were no regional wall motion abnormalities. Doppler   parameters are consistent with high ventricular filling pressure. - Ventricular septum: Septal motion showed paradox. These changes   are consistent with a post-thoracotomy state. - Aortic valve: A bioprosthesis was present and functioning   normally. - Mitral valve: Calcified  annulus. Mildly thickened leaflets . - Pulmonary arteries: PA peak pressure: 32 mm Hg (S).  Assessment & Plan    81 yo male with PMH of AAA, AVR, HTN, pulmonary fibrosis, and interstitial lung disease who presented with bilateral foot pain.   Pre op clearance: Denies any anginal symptoms prior to this admission, but is very limited by his lung disease. Requires home O2 at 6L continuously. Underwent cardiac cath in 2011 with nonobstructive CAD at that time, and underwent AVR. Did have AF after this and was treated with amiodarone. Wore a holter monitor earlier this year and AF found again. Placed back on eliquis. Last echo 1/17 showed normal EF. BNP 01/05/16 was mildly elevated and he was placed on lasix 20mg  PO by Dr. Jens Som. His Eliquis was held on admission, with plans to start on heparin if cleared by hematology given his thrombocytopenia. Does not appear to be volume overloaded on exam. He is at higher risk given his lung disease, and thrombocytopenia. Discuss with MD if need for further testing at this time. MD to give clearance.   Bard, Haupert, NP-C Pager [phone] 01/23/2016, 2:47 PM    Patient seen and examined. Agree with assessment and plan.  [name] is a 81 year old Caucasian male who is followed by Dr. Jens Som from a cardiology perspective.  He has a history of a mild abdominal aortic aneurysm, history of hypertension, pulmonary fibrosis and interstitial lung disease.  He is on supplemental oxygen.  Catheterization in 2011 did not reveal significant obstructive CAD but he had moderately severe aortic stenosis and he ultimately underwent aortic valve replacement on 10/15/2009 with a pericardial tissue valve.  His last cardiac echo was in January 2017 which showed an EF of 50-55%.  He had a normal aortic valve bioprosthesis.  There was mild pulmonary hypertension.  He has documented carotid disease with Dopplers in August 2017 revealing 60-79% right and 40-59%  left stenoses.  He had developed atrial fibrillation this year and was started on eliquis anticoagulation.  He was admitted  after developing thromboembolic disease to his toes.  He is felt to have disease in the right superficial femoral artery at the abductor canal with short segment occlusion and likely had focal disease that ultimately went on to thrombose.  He is been found to have thrombocytopenia.  His eliquis has been on hold and there are plans to institute heparin pending hematologic evaluation.  His platelet count today has improved to 10 7000.  [name] denies any anginal symptomatology.  He has been on furosemide, pravastatin, finasteride, low dose midodrine and pantoprazole.  On telemetry, he is in atrial fibrillation with ventricular rate at 91-100 bpm.  His ECG independently reviewed by me shows atrial fibrillation at 91 bpm with an interventricular conduction delay.  I am suggesting the addition of very low-dose cardioselective beta-blockade for improved rate control of his atrial fibrillation.  In addition, with his significant PVD, I recommend discontinuing pravastatin and use a high potency statin with either Crestor or atorvastatin to reach target < 70  if he is able to tolerate these agents.  I will schedule for a follow-up lipid panel in the morning since he's not had one recently.  Since it has been almost one year since his last echo Doppler study, I will schedule him for an echo Doppler evaluation.  At present, I do not feel that he needs an ischemic evaluation prior to his planned vascular procedure which is tentatively scheduled for 01/27/2016.  We will follow the patient with you.  We will follow the patient with you.   Lennette Biharihomas A. Kelly, MD, [facility] JerseyFACC 01/23/2016 4:43 PM

## 2016-01-23 NOTE — Progress Notes (Signed)
Patient just remembered that he had fallen asleep in the recliner and his right foot fell flat on the floor and that it was very painful when it happened. States that it happened about a week ago.

## 2016-01-23 NOTE — Consult Note (Signed)
Referral MD  Reason for Referral: Thrombocytopenia  Chief Complaint  Patient presents with  . Foot Pain  : I have a blood clot in my leg.  HPI: [name] is a very nice 81yo white male. He has multiple medical problems. He has a artificial aortic valve. He has a Calva. He has atrial fibrillation. He has pulmonary fibrosis.  He has significant peripheral vascular disease. He was admitted because of ischemia in all feet. He actually lives in [place]. He was seen at the [facility] office.  He was admitted on the 28th. When he came in, his platelet count was 78,000.  About 2 weeks ago, his blood count was 154,000. Call back to the record, his blood count has fluctuated.  Today, his plan to count was 107,000.  He does have some element of anemia. His hemoglobin today was 9.4. His MCV was 83. I spent that he probably has iron deficiency.  He was found to have peripheral vascular disease. He did undergo CT angiogram today. Looks like he has high-grade stenosis or occlusion of the distal SFA. There is right-sided tibial disease with distal occlusion of the anterior tibial and posterior tibial arteries. He has left femoral popliteal disease. He does have left main and three-vessel coronary artery disease. He has cardiomegaly.  He's been on ELIQUIS. He's been on blood thinner for quite a while. He's really had no issues with bleeding.  Dr. Scot Dock called [place] to see if we could help out with this transient thrombocytopenia.    Past Medical History:  Diagnosis Date  . AAA (abdominal aortic aneurysm) ([place])   . Aortic stenosis    AVR 2011  . Arthritis    "hands" (01/23/2016)  . Atrial fibrillation ([place])    "started in 09/2015" (01/23/2016)  . Cleft palate   . Dermatitis    poison ivy  . GERD (gastroesophageal reflux disease)   . Heart murmur    "before AVR"  . History of hiatal hernia   . Hyperlipidemia   . Interstitial lung disease ([place])   . Iron (Fe) deficiency  anemia    "had an infusion 07/2015" (01/23/2016)  . On home oxygen therapy    "6L; 24/7" (01/23/2016)  . Pulmonary fibrosis ([place])   . PVD (peripheral vascular disease) ([place]) 01/22/2016  . Syncope and collapse 07/18/2014  :  Past Surgical History:  Procedure Laterality Date  . CARDIAC CATHETERIZATION  09/2009  . CARDIAC VALVE REPLACEMENT    . CATARACT EXTRACTION W/ INTRAOCULAR LENS  IMPLANT, BILATERAL Bilateral 2004  . CLEFT PALATE REPAIR  1938 - ? X 7  . INGUINAL HERNIA REPAIR Right 1950's  . LAPAROSCOPIC CHOLECYSTECTOMY  1999  . oral surgery with bone graft from hip for cleft palate  1989  . ROTATOR CUFF REPAIR Left 08/2007  . TISSUE AORTIC VALVE REPLACEMENT  10/15/2009   w/ pericardial tissue valve; Dr Ricard Dillon  :   Current Facility-Administered Medications:  .  acetaminophen (TYLENOL) tablet 325-650 mg, 325-650 mg, Oral, Q4H PRN **OR** acetaminophen (TYLENOL) suppository 325-650 mg, 325-650 mg, Rectal, Q4H PRN, Angelia Mould, MD .  alum & mag hydroxide-simeth (MAALOX/MYLANTA) 200-200-20 MG/5ML suspension 15-30 mL, 15-30 mL, Oral, Q2H PRN, Angelia Mould, MD .  enoxaparin (LOVENOX) injection 40 mg, 40 mg, Subcutaneous, Q24H, Angelia Mould, MD, 40 mg at 01/23/16 1029 .  finasteride (PROSCAR) tablet 5 mg, 5 mg, Oral, Daily, Angelia Mould, MD, 5 mg at 01/23/16 1029 .  furosemide (LASIX) tablet 20 mg,  20 mg, Oral, QODAY, Angelia Mould, MD, 20 mg at 01/23/16 1029 .  guaiFENesin-dextromethorphan (ROBITUSSIN DM) 100-10 MG/5ML syrup 15 mL, 15 mL, Oral, Q4H PRN, Angelia Mould, MD .  hydrALAZINE (APRESOLINE) injection 5 mg, 5 mg, Intravenous, Q20 Min PRN, Angelia Mould, MD .  labetalol (NORMODYNE,TRANDATE) injection 10 mg, 10 mg, Intravenous, Q10 min PRN, Angelia Mould, MD .  loratadine (CLARITIN) tablet 10 mg, 10 mg, Oral, q morning - 10a, Angelia Mould, MD, 10 mg at 01/23/16 1029 .  metoprolol (LOPRESSOR) injection 2-5 mg,  2-5 mg, Intravenous, Q2H PRN, Angelia Mould, MD .  midodrine (PROAMATINE) tablet 2.5 mg, 2.5 mg, Oral, BID WC, Angelia Mould, MD, 2.5 mg at 01/23/16 [phone] .  multivitamin with minerals tablet 1 tablet, 1 tablet, Oral, Daily, Angelia Mould, MD, 1 tablet at 01/23/16 1029 .  ondansetron (ZOFRAN) injection 4 mg, 4 mg, Intravenous, Q6H PRN, Angelia Mould, MD .  oxyCODONE-acetaminophen (PERCOCET/ROXICET) 5-325 MG per tablet 1-2 tablet, 1-2 tablet, Oral, Q4H PRN, Angelia Mould, MD .  pantoprazole (PROTONIX) EC tablet 40 mg, 40 mg, Oral, BID AC, Angelia Mould, MD, 40 mg at 01/23/16 1612 .  phenol (CHLORASEPTIC) mouth spray 1 spray, 1 spray, Mouth/Throat, PRN, Angelia Mould, MD .  potassium chloride SA (K-DUR,KLOR-CON) CR tablet 20-40 mEq, 20-40 mEq, Oral, Once, Angelia Mould, MD .  pravastatin (PRAVACHOL) tablet 40 mg, 40 mg, Oral, QHS, Angelia Mould, MD .  predniSONE (DELTASONE) tablet 20 mg, 20 mg, Oral, Q breakfast, Angelia Mould, MD, 20 mg at 01/23/16 7209  Facility-Administered Medications Ordered in Other Encounters:  .  iron dextran complex (INFED) 25 mg in sodium chloride 0.9 % 500 mL IVPB, 25 mg, Intravenous, Once **FOLLOWED BY** iron dextran complex (INFED) 1,000 mg in sodium chloride 0.9 % 500 mL IVPB, 1,000 mg, Intravenous, Once, Sean Hommel, DO:  . enoxaparin (LOVENOX) injection  40 mg Subcutaneous Q24H  . finasteride  5 mg Oral Daily  . furosemide  20 mg Oral QODAY  . loratadine  10 mg Oral q morning - 10a  . midodrine  2.5 mg Oral BID WC  . multivitamin with minerals  1 tablet Oral Daily  . pantoprazole  40 mg Oral BID AC  . potassium chloride  20-40 mEq Oral Once  . pravastatin  40 mg Oral QHS  . predniSONE  20 mg Oral Q breakfast  :  Allergies  Allergen Reactions  . Pravastatin Sodium Other (See Comments)    Sneezing episodes, can take medication and tolerates reaction.  . Pirfenidone Nausea And  Vomiting  :  Family History  Problem Relation Age of Onset  . Heart attack Mother   . Cancer Father     lung and stomach  . Heart attack Father   . Cancer Sister     bone  . Diabetes Sister   . Heart murmur Brother   . Hypertension Brother   . Hyperlipidemia Brother   . Heart attack Brother   :  Social History   Social History  . Marital status: Married    Spouse name: N/A  . Number of children: N/A  . Years of education: N/A   Occupational History  . Retired Retired   Social History Main Topics  . Smoking status: Former Smoker    Packs/day: 1.50    Years: 5.00    Types: Cigarettes, Cigars    Quit date: 09/08/1977  . Smokeless tobacco: Never Used  .  Alcohol use No  . Drug use: No  . Sexual activity: No   Other Topics Concern  . Not on file   Social History Narrative  . No narrative on file  :  Pertinent items are noted in HPI.  Exam: Patient Vitals for the past 24 hrs:  BP Temp Temp src Pulse Resp SpO2 Height Weight  01/23/16 1253 120/65 97.4 F (36.3 C) Oral (!) 103 20 100 % - -  01/23/16 1205 112/74 - - 94 19 96 % - -  01/23/16 0857 114/68 - - 94 18 100 % - -  01/23/16 0509 103/61 97.6 F (36.4 C) Oral 85 20 100 % - 194 lb 9.6 oz (88.3 kg)  01/23/16 0004 124/69 98.3 F (36.8 C) Oral (!) 102 20 100 % 6' 1"  (1.854 m) 196 lb 12.8 oz (89.3 kg)  01/22/16 2330 106/73 - - 93 - 100 % - -  01/22/16 2315 123/81 - - 97 - 97 % - -  01/22/16 2245 131/72 - - 120 - 94 % - -  01/22/16 2230 128/93 - - 94 - 100 % - -  01/22/16 2215 125/77 - - 92 - 100 % - -  01/22/16 2130 131/100 - - 96 - 98 % - -  01/22/16 2115 124/86 - - (!) 123 - (!) 84 % - -  01/22/16 2100 132/86 - - 101 - 100 % - -  01/22/16 2045 130/81 - - 100 - 100 % - -  01/22/16 1944 132/72 97.9 F (36.6 C) Oral 102 20 100 % - -    As above there is no splenomegaly. There is no hepatomegaly. I do not feel any swollen lymph nodes. He has no ecchymoses or petechia. Cardiac exam shows atrial fibrillation.  He may have a 1/6 systolic murmur. Lungs show some crackles bilaterally. He has decent air movement.    Recent Labs  01/22/16 1836 01/23/16 0202  WBC 21.1* 17.2*  HGB 10.1* 9.4*  HCT 32.0* 29.8*  PLT 78* 107*    Recent Labs  01/22/16 1836 01/23/16 0202  NA 140 140  K 5.1 4.1  CL 97* 96*  CO2 35* 33*  GLUCOSE 107* 100*  BUN 32* 27*  CREATININE 1.21 1.21  CALCIUM 9.2 8.8*    Blood smear review: Mild anisocytosis and poikilocytosis. There is some microcytic red cells. There is no nucleated red blood cells. I see no teardrop cells. He has known rouleau formation. There are no inclusion bodies. White cells are normal in morphology maturation. There is no immature myeloid or lymphoid forms. I see no hypersegmented polys. Platelets are minimally decreased in number. Platelets are well granulated. He has a few large platelets.  Pathology: None     Assessment and Plan:  [name] is a very nice 81 year old white male. He has severe peripheral vascular disease. He is on blood thinner for atrial fibrillation and an aortic valve replacement.  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 His platelet count has to really been stable although a little on the lower side. This may be secondary to medications. It also could be from possibly a shearing effect going through the aortic valve.  He has been on blood thinner for quite a while and yet has never had any bleeding issue.  I always worry about the possibility of some myelodysplasia in a patient who is 81 years old. The only way to make that diagnosis would be with a bone marrow biopsy which I  don't believe is necessary.  I would make sure that when his platelets are checked, that this is done with a citrated tube. Again he may have a pseudothrombocytopenia which could be a laboratory phenomenon.  I feel the anemia probably is multifactorial. I think he is clearly iron deficient. He probably has some  element of erythropoietin deficiency. I don't see that it is any evidence of hemolysis.  At this point, I don't think we have to do anything special. I think he has adequate coagulation parameters that this should be low risk of bleeding if he were to have a procedure for his legs that needed a stent or angioplasty.  [name] is very nice. He was with his son. He had a good talk. I spent about 45 minutes with he and his son. Answered their questions.  We will follow along. I will be one having to help out in any way possible.  Lattie Haw, MD  Darlyn Chamber 17:14

## 2016-01-24 LAB — CBC WITH DIFFERENTIAL/PLATELET
BASOS PCT: 0 %
Basophils Absolute: 0 10*3/uL (ref 0.0–0.1)
EOS PCT: 0 %
Eosinophils Absolute: 0 10*3/uL (ref 0.0–0.7)
HEMATOCRIT: 28 % — AB (ref 39.0–52.0)
Hemoglobin: 9 g/dL — ABNORMAL LOW (ref 13.0–17.0)
LYMPHS ABS: 1.8 10*3/uL (ref 0.7–4.0)
Lymphocytes Relative: 14 %
MCH: 26.9 pg (ref 26.0–34.0)
MCHC: 32.1 g/dL (ref 30.0–36.0)
MCV: 83.8 fL (ref 78.0–100.0)
MONO ABS: 2 10*3/uL — AB (ref 0.1–1.0)
MONOS PCT: 15 %
Neutro Abs: 9.4 10*3/uL — ABNORMAL HIGH (ref 1.7–7.7)
Neutrophils Relative %: 71 %
PLATELETS: 74 10*3/uL — AB (ref 150–400)
RBC: 3.34 MIL/uL — ABNORMAL LOW (ref 4.22–5.81)
RDW: 19.4 % — AB (ref 11.5–15.5)
WBC: 13.2 10*3/uL — AB (ref 4.0–10.5)

## 2016-01-24 LAB — HEPARIN LEVEL (UNFRACTIONATED): Heparin Unfractionated: 1.08 IU/mL — ABNORMAL HIGH (ref 0.30–0.70)

## 2016-01-24 LAB — GLUCOSE, CAPILLARY: Glucose-Capillary: 126 mg/dL — ABNORMAL HIGH (ref 65–99)

## 2016-01-24 LAB — APTT
APTT: 55 s — AB (ref 24–36)
aPTT: 31 seconds (ref 24–36)

## 2016-01-24 MED ORDER — HEPARIN (PORCINE) IN NACL 100-0.45 UNIT/ML-% IJ SOLN
1350.0000 [IU]/h | INTRAMUSCULAR | Status: DC
  Administered 2016-01-24: 1250 [IU]/h via INTRAVENOUS
  Administered 2016-01-25 (×2): 1400 [IU]/h via INTRAVENOUS
  Administered 2016-01-26: 1350 [IU]/h via INTRAVENOUS
  Filled 2016-01-24 (×4): qty 250

## 2016-01-24 MED ORDER — METOPROLOL TARTRATE 25 MG PO TABS
25.0000 mg | ORAL_TABLET | Freq: Two times a day (BID) | ORAL | Status: DC
  Administered 2016-01-24 – 2016-01-28 (×7): 25 mg via ORAL
  Filled 2016-01-24 (×8): qty 1

## 2016-01-24 NOTE — Progress Notes (Signed)
Patient Name: [name] Date of Encounter: 01/24/2016  [facility]  [facility] Problem List     Active Problems:   PVD (peripheral vascular disease) (HCC)   Atheroembolic kidney disease (HCC)   Pre-operative clearance   Thrombocytopenia (HCC)     Subjective   Feeling well today without chest pain/SOB  Inpatient Medications    Scheduled Meds: . finasteride  5 mg Oral Daily  . furosemide  20 mg Oral QODAY  . loratadine  10 mg Oral q morning - 10a  . midodrine  2.5 mg Oral BID WC  . multivitamin with minerals  1 tablet Oral Daily  . pantoprazole  40 mg Oral BID AC  . potassium chloride  20-40 mEq Oral Once  . pravastatin  40 mg Oral QHS  . predniSONE  20 mg Oral Q breakfast   Continuous Infusions: . heparin     PRN Meds: acetaminophen **OR** acetaminophen, alum & mag hydroxide-simeth, guaiFENesin-dextromethorphan, hydrALAZINE, labetalol, metoprolol, ondansetron, oxyCODONE-acetaminophen, phenol   Vital Signs    Vitals:   01/23/16 1205 01/23/16 1253 01/23/16 2030 01/24/16 0459  BP: 112/74 120/65 121/82 115/72  Pulse: 94 (!) 103 (!) 102 79  Resp: 19 20 20 18   Temp:  97.4 F (36.3 C) 98.1 F (36.7 C) 97.6 F (36.4 C)  TempSrc:  Oral Oral Oral  SpO2: 96% 100% 100% 100%  Weight:      Height:        Intake/Output Summary (Last 24 hours) at 01/24/16 1031 Last data filed at 01/24/16 0917  Gross per 24 hour  Intake             1040 ml  Output             1000 ml  Net               40 ml   Filed Weights   01/22/16 1736 01/23/16 0004 01/23/16 0509  Weight: 207 lb (93.9 kg) 196 lb 12.8 oz (89.3 kg) 194 lb 9.6 oz (88.3 kg)    Physical Exam    GEN: Well nourished, well developed, in no acute distress.  HEENT: Grossly normal.  Neck: Supple, no JVD, carotid bruits, or masses. Cardiac: iRRR, no murmurs, rubs, or gallops. No clubbing, cyanosis, edema.  Radials/DP/PT 2+ and equal bilaterally.  Respiratory:  Respirations regular and  unlabored, crackles bilaterally. GI: Soft, nontender, nondistended, BS + x 4. MS: no deformity or atrophy. Skin: warm and dry, no rash. Neuro:  Strength and sensation are intact. Psych: AAOx3.  Normal affect.  Labs    CBC  Recent Labs  01/22/16 1836 01/23/16 0202 01/24/16 0535  WBC 21.1* 17.2* 13.2*  NEUTROABS 18.9*  --  9.4*  HGB 10.1* 9.4* 9.0*  HCT 32.0* 29.8* 28.0*  MCV 84.9 82.5 83.8  PLT 78* 107* 74*   Basic Metabolic Panel  Recent Labs  01/22/16 1836 01/23/16 0202  NA 140 140  K 5.1 4.1  CL 97* 96*  CO2 35* 33*  GLUCOSE 107* 100*  BUN 32* 27*  CREATININE 1.21 1.21  CALCIUM 9.2 8.8*   Liver Function Tests  Recent Labs  01/22/16 1836  AST 17  ALT 17  ALKPHOS 56  BILITOT 0.8  PROT 6.9  ALBUMIN 4.2   No results for input(s): LIPASE, AMYLASE in the last 72 hours. Cardiac Enzymes No results for input(s): CKTOTAL, CKMB, CKMBINDEX, TROPONINI in the last 72 hours. BNP Invalid input(s): POCBNP D-Dimer No results for input(s): DDIMER in  the last 72 hours. Hemoglobin A1C No results for input(s): HGBA1C in the last 72 hours. Fasting Lipid Panel No results for input(s): CHOL, HDL, LDLCALC, TRIG, CHOLHDL, LDLDIRECT in the last 72 hours. Thyroid Function Tests No results for input(s): TSH, T4TOTAL, T3FREE, THYROIDAB in the last 72 hours.  Invalid input(s): FREET3  Telemetry    Atrial fibrillation - Personally Reviewed  ECG    Atrial fibrillation, nonspecific interventricular conduction delay - Personally Reviewed  Radiology    Ct Angio Chest Pe W Or Wo Contrast  Result Date: 01/23/2016 CLINICAL DATA:  81 year old male with swelling in his legs. EXAM: CT ANGIOGRAPHY CHEST; CT ANGIOGRAPHY AOBIFEM WITHOUT AND WITH CONTRAST<Procedure Description>CT ANGIOGRAPHY CHEST; CT ANGIOGRAPHY AOBIFEM WITHOUT AND WITH CONTRAST TECHNIQUE: SEQUENTIAL AXIAL CT IMAGES WERE ACQUIRED OF THE CHEST, ABDOMEN, PELVIS AFTER ADMINISTRATION OF IV CONTRAST BOLUS. THE  ACQUISITION WAS CONTINUED FROM THE PELVIS THROUGH THE BILATERAL LOWER EXTREMITY FOR RUNOFF. RE-FORMATTED IMAGES WERE PERFORMED IN STANDARD CORONAL AND SAGITTAL PLANES ON A SEPARATE WORKSTATION. CONTRAST:  180 CC ISOVUE 370 COMPARISON:  Chest CT 11/07/2014, 06/13/2012, CT abdomen 07/29/2013 FINDINGS: Chest: Cardiovascular: Heart: Heart size enlarged, more pronounced than the comparison CT. Surgical changes of median sternotomy, aortic valve repair. Negative coronary calcifications including left main, left anterior descending, circumflex, right coronary arteries. Aorta: Bolus timing not optimized for evaluation of the aorta, however, no aneurysm or periaortic fluid. Calcifications of the descending thoracic aorta. Pulmonary arteries: No central, lobar, segmental, or proximal subsegmental filling defects. Mediastinum/Nodes: Mediastinal lymph nodes are present, none of which are enlarged by CT size criteria. Unremarkable appearance of the thoracic esophagus. Unremarkable appearance of the thoracic inlet and thyroid. Lungs/Pleura: Re- demonstration of subpleural reticulation, bronchiectasis and architectural distortion, and pattern of honeycombing, right lung greater than left with the distribution that is more pronounced at the inferior aspects of the lungs. Compared to the prior CT, lung volumes are smaller and the pattern appears more pronounced (over the course of the year). Peribronchial thickening with increase consolidation most pronounced in the right lower lobe. No endotracheal or endobronchial debris. No pleural effusion. No pneumothorax. Musculoskeletal: No displaced fracture. Degenerative changes of the spine. Abdomen: Aorta: Mixed calcified and soft plaque of the abdominal aorta. Aortic diameter unremarkable at the aortic hiatus. Infrarenal abdominal aorta measures as large as 3.2 cm, which is slightly enlarged when compared to the CT of 07/29/2013. No periaortic fluid. No dissection flap. Celiac:  Atherosclerotic changes at the origin of the celiac artery. There is approximately 50% narrowing at the origin, potentially related to 0 combination of atherosclerotic changes and diaphragmatic crus. Branch pattern typical with celiac artery branching into the splenic artery, common hepatic artery, and the left gastric artery. SMA: Atherosclerotic changes at the origin of the superior mesenteric artery without significant stenosis. Renals: There is at least 50% narrowing of the left renal artery origin. Atherosclerotic changes at the origin of the right renal artery, with likely 50% stenosis. IMA: Inferior mesenteric artery is patent. Left colic artery patent. Superior rectal artery patent. Right lower extremity: Atherosclerotic changes of the right iliac system with no aneurysm or dissection of the common iliac artery. Hypogastric arteries patent including anterior and posterior division. External iliac artery patent. Common femoral artery patent with mild atherosclerotic changes. Atherosclerotic disease extends through the superficial femoral artery. Profunda femoris is patent. There is either high-grade stenosis or short segment occlusion of the SFA in the canal. Popliteal artery patent. Anterior tibial artery appears patent at the origin with atherosclerotic changes. Tibioperoneal trunk  patent. Peroneal artery is patent to the ankle. There is occlusion of the distal anterior tibial artery above the ankle and the posterior tibial artery above the ankle. Left lower extremity: Atherosclerotic changes of the common iliac artery with no aneurysm or dissection. Hypogastric artery is patent. Fusiform ectasias/ aneurysm of the hypogastric artery measuring 13 mm. Anterior posterior division are patent. External iliac artery patent. Common femoral artery patent with mild atherosclerotic changes. Profunda femoris is patent. Superficial femoral artery demonstrates mixed calcified and soft plaque throughout its course. There  is perhaps 50% narrowing in the canal in the above knee popliteal artery, although the popliteal artery remains patent. Proximal anterior tibial artery is patent with atherosclerotic changes at the origin. Tibioperoneal trunk is patent with calcified disease. Anterior tibial artery appears patent to the ankle. Posterior tibial artery appears patent to the ankle. Peroneal artery appears patent to the ankle. Veins: Unremarkable appearance of the venous system. Review of the MIP images confirms the above findings. NON-VASCULAR Hepatobiliary: Unremarkable appearance of the liver. Cholecystectomy. Pancreas: Unremarkable appearance of the pancreas. Spleen: Unremarkable. Adrenals/Urinary Tract: Unremarkable appearance of adrenal glands. Right: No hydronephrosis. No nephrolithiasis. Hypodense lesion at the superior cortex measures 1.6 cm, relatively unchanged in size from the comparison CT. Hounsfield units measure fluid density on this single phase. There are additional low-density lesions which are too small to characterize. Left: No hydronephrosis. No nephrolithiasis. Hypodense lesion on the lateral kidney cortex, too small to completely characterize, though appears most likely to represent a benign cyst. Redundant urinary bladder which is significantly distended with urine. No urinary bladder mass identified on the current CT which was described on the CT dated 07/29/2013. Mild urinary bladder wall thickening, particularly at the bladder base. Stomach/Bowel: Re- demonstration of large hiatal hernia. Distention of the esophagus with air and debris, particularly in the lower esophagus. No abnormally dilated small bowel or colon. Diverticular disease without evidence of acute diverticulitis. Appendix is not visualized, however, no inflammatory changes are present adjacent to the cecum to indicate an appendicitis. Lymphatic: Multiple lymph nodes in the para-aortic nodal station, none of which are enlarged. Mesenteric: No  free fluid or air. No adenopathy. Reproductive: Prostate calcifications. Other: No hernia. Musculoskeletal: No evidence of acute fracture. No bony canal narrowing. No significant degenerative changes of the hips. IMPRESSION: Right femoral popliteal disease with either high-grade stenosis or short segment occlusion of the distal SFA within the canal. Popliteal artery is patent via direct flow or reconstitution. Right-sided tibial disease, with single vessel peroneal artery patent to the ankle and distal occlusion of anterior tibial and posterior tibial arteries. Left femoral popliteal disease with calcified and soft plaque within the adductor canal contributing to perhaps 50% stenosis. Left-sided tibial disease, although there appears to be 3 vessels patent to the ankle. Aortic atherosclerosis with small aneurysm of the infrarenal abdominal aorta. Current ACR criteria recommend followup by ultrasound in 3 years. This recommendation follows ACR consensus guidelines: White Paper of the ACR Incidental Findings Committee II on Vascular Findings. J Am Coll Radiol 2013; 10:789-794. Bilateral renal artery atherosclerotic disease. Degree of stenosis difficult to assess, with perhaps 50% narrowing of the bilateral renal artery origin. Study is negative for pulmonary embolism. Re- demonstration of pattern of interstitial lung disease, of the right greater than left lungs. Increasing para bronchovascular nodular opacity of predominantly the right lobe may reflect atelectasis given the smaller lung volumes, potentially worsening chronic changes, or alternatively acute bronchitis/pneumonia. Correlation with patient's labs in symptoms may be useful. Re- demonstration  of large hiatal hernia. There is associated debris and air-fluid level within the distal thoracic esophagus. This could be seen with achalasia. Left main and 3 vessel coronary artery disease. Surgical changes of median sternotomy and aortic valve replacement.  Cardiomegaly, appearing more pronounced than the comparison CT of 1 year prior. Diverticular disease without evidence of acute diverticulitis. Signed, Yvone Neu. Loreta Ave, DO [facility] Radiology Electronically Signed   By: Gilmer Mor D.O.   On: 01/23/2016 16:36   Ct Angio Ao+bifem W &/or Wo Contrast  Result Date: 01/23/2016 CLINICAL DATA:  81 year old male with swelling in his legs. EXAM: CT ANGIOGRAPHY CHEST; CT ANGIOGRAPHY AOBIFEM WITHOUT AND WITH CONTRAST<Procedure Description>CT ANGIOGRAPHY CHEST; CT ANGIOGRAPHY AOBIFEM WITHOUT AND WITH CONTRAST TECHNIQUE: SEQUENTIAL AXIAL CT IMAGES WERE ACQUIRED OF THE CHEST, ABDOMEN, PELVIS AFTER ADMINISTRATION OF IV CONTRAST BOLUS. THE ACQUISITION WAS CONTINUED FROM THE PELVIS THROUGH THE BILATERAL LOWER EXTREMITY FOR RUNOFF. RE-FORMATTED IMAGES WERE PERFORMED IN STANDARD CORONAL AND SAGITTAL PLANES ON A SEPARATE WORKSTATION. CONTRAST:  180 CC ISOVUE 370 COMPARISON:  Chest CT 11/07/2014, 06/13/2012, CT abdomen 07/29/2013 FINDINGS: Chest: Cardiovascular: Heart: Heart size enlarged, more pronounced than the comparison CT. Surgical changes of median sternotomy, aortic valve repair. Negative coronary calcifications including left main, left anterior descending, circumflex, right coronary arteries. Aorta: Bolus timing not optimized for evaluation of the aorta, however, no aneurysm or periaortic fluid. Calcifications of the descending thoracic aorta. Pulmonary arteries: No central, lobar, segmental, or proximal subsegmental filling defects. Mediastinum/Nodes: Mediastinal lymph nodes are present, none of which are enlarged by CT size criteria. Unremarkable appearance of the thoracic esophagus. Unremarkable appearance of the thoracic inlet and thyroid. Lungs/Pleura: Re- demonstration of subpleural reticulation, bronchiectasis and architectural distortion, and pattern of honeycombing, right lung greater than left with the  distribution that is more pronounced at the inferior aspects of the lungs. Compared to the prior CT, lung volumes are smaller and the pattern appears more pronounced (over the course of the year). Peribronchial thickening with increase consolidation most pronounced in the right lower lobe. No endotracheal or endobronchial debris. No pleural effusion. No pneumothorax. Musculoskeletal: No displaced fracture. Degenerative changes of the spine. Abdomen: Aorta: Mixed calcified and soft plaque of the abdominal aorta. Aortic diameter unremarkable at the aortic hiatus. Infrarenal abdominal aorta measures as large as 3.2 cm, which is slightly enlarged when compared to the CT of 07/29/2013. No periaortic fluid. No dissection flap. Celiac: Atherosclerotic changes at the origin of the celiac artery. There is approximately 50% narrowing at the origin, potentially related to 0 combination of atherosclerotic changes and diaphragmatic crus. Branch pattern typical with celiac artery branching into the splenic artery, common hepatic artery, and the left gastric artery. SMA: Atherosclerotic changes at the origin of the superior mesenteric artery without significant stenosis. Renals: There is at least 50% narrowing of the left renal artery origin. Atherosclerotic changes at the origin of the right renal artery, with likely 50% stenosis. IMA: Inferior mesenteric artery is patent. Left colic artery patent. Superior rectal artery patent. Right lower extremity: Atherosclerotic changes of the right iliac system with no aneurysm or dissection of the common iliac artery. Hypogastric arteries patent including anterior and posterior division. External iliac artery patent. Common femoral artery patent with mild atherosclerotic changes. Atherosclerotic disease extends through the superficial femoral artery. Profunda femoris is patent. There is either high-grade stenosis or short segment occlusion of the SFA in the canal. Popliteal artery patent.  Anterior tibial artery appears patent at the origin with atherosclerotic  changes. Tibioperoneal trunk patent. Peroneal artery is patent to the ankle. There is occlusion of the distal anterior tibial artery above the ankle and the posterior tibial artery above the ankle. Left lower extremity: Atherosclerotic changes of the common iliac artery with no aneurysm or dissection. Hypogastric artery is patent. Fusiform ectasias/ aneurysm of the hypogastric artery measuring 13 mm. Anterior posterior division are patent. External iliac artery patent. Common femoral artery patent with mild atherosclerotic changes. Profunda femoris is patent. Superficial femoral artery demonstrates mixed calcified and soft plaque throughout its course. There is perhaps 50% narrowing in the canal in the above knee popliteal artery, although the popliteal artery remains patent. Proximal anterior tibial artery is patent with atherosclerotic changes at the origin. Tibioperoneal trunk is patent with calcified disease. Anterior tibial artery appears patent to the ankle. Posterior tibial artery appears patent to the ankle. Peroneal artery appears patent to the ankle. Veins: Unremarkable appearance of the venous system. Review of the MIP images confirms the above findings. NON-VASCULAR Hepatobiliary: Unremarkable appearance of the liver. Cholecystectomy. Pancreas: Unremarkable appearance of the pancreas. Spleen: Unremarkable. Adrenals/Urinary Tract: Unremarkable appearance of adrenal glands. Right: No hydronephrosis. No nephrolithiasis. Hypodense lesion at the superior cortex measures 1.6 cm, relatively unchanged in size from the comparison CT. Hounsfield units measure fluid density on this single phase. There are additional low-density lesions which are too small to characterize. Left: No hydronephrosis. No nephrolithiasis. Hypodense lesion on the lateral kidney cortex, too small to completely characterize, though appears most likely to represent a  benign cyst. Redundant urinary bladder which is significantly distended with urine. No urinary bladder mass identified on the current CT which was described on the CT dated 07/29/2013. Mild urinary bladder wall thickening, particularly at the bladder base. Stomach/Bowel: Re- demonstration of large hiatal hernia. Distention of the esophagus with air and debris, particularly in the lower esophagus. No abnormally dilated small bowel or colon. Diverticular disease without evidence of acute diverticulitis. Appendix is not visualized, however, no inflammatory changes are present adjacent to the cecum to indicate an appendicitis. Lymphatic: Multiple lymph nodes in the para-aortic nodal station, none of which are enlarged. Mesenteric: No free fluid or air. No adenopathy. Reproductive: Prostate calcifications. Other: No hernia. Musculoskeletal: No evidence of acute fracture. No bony canal narrowing. No significant degenerative changes of the hips. IMPRESSION: Right femoral popliteal disease with either high-grade stenosis or short segment occlusion of the distal SFA within the canal. Popliteal artery is patent via direct flow or reconstitution. Right-sided tibial disease, with single vessel peroneal artery patent to the ankle and distal occlusion of anterior tibial and posterior tibial arteries. Left femoral popliteal disease with calcified and soft plaque within the adductor canal contributing to perhaps 50% stenosis. Left-sided tibial disease, although there appears to be 3 vessels patent to the ankle. Aortic atherosclerosis with small aneurysm of the infrarenal abdominal aorta. Current ACR criteria recommend followup by ultrasound in 3 years. This recommendation follows ACR consensus guidelines: White Paper of the ACR Incidental Findings Committee II on Vascular Findings. J Am Coll Radiol 2013; 10:789-794. Bilateral renal artery atherosclerotic disease. Degree of stenosis difficult to assess, with perhaps 50% narrowing of  the bilateral renal artery origin. Study is negative for pulmonary embolism. Re- demonstration of pattern of interstitial lung disease, of the right greater than left lungs. Increasing para bronchovascular nodular opacity of predominantly the right lobe may reflect atelectasis given the smaller lung volumes, potentially worsening chronic changes, or alternatively acute bronchitis/pneumonia. Correlation with patient's labs in symptoms may be  useful. Re- demonstration of large hiatal hernia. There is associated debris and air-fluid level within the distal thoracic esophagus. This could be seen with achalasia. Left main and 3 vessel coronary artery disease. Surgical changes of median sternotomy and aortic valve replacement. Cardiomegaly, appearing more pronounced than the comparison CT of 1 year prior. Diverticular disease without evidence of acute diverticulitis. Signed, Yvone Neu. Loreta Ave, DO [facility] Radiology Electronically Signed   By: Gilmer Mor D.O.   On: 01/23/2016 16:36    Cardiac Studies   - Left ventricle: The cavity size was normal. Wall thickness was   normal. Systolic function was normal. The estimated ejection   fraction was in the range of 50% to 55%. Wall motion was normal;   there were no regional wall motion abnormalities. Doppler   parameters are consistent with high ventricular filling pressure. - Ventricular septum: Septal motion showed paradox. These changes   are consistent with a post-thoracotomy state. - Aortic valve: A bioprosthesis was present and functioning   normally. - Mitral valve: Calcified annulus. Mildly thickened leaflets . - Pulmonary arteries: PA peak pressure: 32 mm Hg (S).  Patient Profile     81 yo male with PMH of AAA, AVR, HTN, pulmonary fibrosis, and interstitial lung disease who presented with bilateral foot pain.   Assessment & Plan    1. Hypertension: well controlled today, no changes to current  regimen.  2. Hyperlipidemia: recently switched to crestor from pravastatin. Lipid panel pending. LDL goal <70.  3. Persistent atrial fibrillation: rate improved with addition of metoprolol.  Will start as a chronic medication at 25 mg. Continue anticoagulation for stroke prevention.   Signed, Will Jorja Loa, MD  01/24/2016, 10:31 AM

## 2016-01-24 NOTE — Progress Notes (Addendum)
[facility] [place]  Subjective  - Over all comfortable.  Right foot feels colder than left.   Objective 115/72 79 97.6 F (36.4 C) (Oral) 18 100%  Intake/Output Summary (Last 24 hours) at 01/24/16 0729 Last data filed at 01/23/16 2200  Gross per 24 hour  Intake              800 ml  Output             1225 ml  Net             -425 ml   DP doppler signals B Right foot toes cool to touch, slight erythema Left foot toes warm to touch, slight erythema No ulcers or dark skin discoloration bilateral feet Heart A fib Lungs non labored breathing   Assessment/Planning: Ischemic changes to bilateral feet digits at admiission  Eliquis on hold pending Heparin due to A fib and aortic valve replacement for aortic stenosis in 2011. This was a pericardial tissue valve. Plan angiogram Tuesday by Dr. Bobetta Limeickson  COLLINS, [facility] 01/24/2016 7:29 AM --  Laboratory Lab Results:  Recent Labs  01/23/16 0202 01/24/16 0535  WBC 17.2* 13.2*  HGB 9.4* 9.0*  HCT 29.8* 28.0*  PLT 107* 74*   BMET  Recent Labs  01/22/16 1836 01/23/16 0202  NA 140 140  K 5.1 4.1  CL 97* 96*  CO2 35* 33*  GLUCOSE 107* 100*  BUN 32* 27*  CREATININE 1.21 1.21  CALCIUM 9.2 8.8*    COAG Lab Results  Component Value Date   INR 1.32 01/23/2016   INR 1.28 01/22/2016   INR 1.01 12/26/2014   No results found for: PTT   I agree with the above, I have seen and examined the patient.  His feet are not discolored today, but he states that they are cold.  Appreciate Hematology recommendations.  Will start IV heparin today with plans for angio on Tuesday.  Durene CalWells Brabham

## 2016-01-24 NOTE — Progress Notes (Signed)
ANTICOAGULATION CONSULT NOTE - Initial Consult  Pharmacy Consult for Heparin  Indication: atrial fibrillation  Allergies  Allergen Reactions  . Pravastatin Sodium Other (See Comments)    Sneezing episodes, can take medication and tolerates reaction.  . Pirfenidone Nausea And Vomiting    Patient Measurements: Height: 6\' 1"  (185.4 cm) Weight: 194 lb 9.6 oz (88.3 kg) IBW/kg (Calculated) : 79.9 Heparin Dosing Weight: 88kg  Vital Signs: Temp: 97.6 F (36.4 C) (12/30 0459) Temp Source: Oral (12/30 0459) BP: 115/72 (12/30 0459) Pulse Rate: 79 (12/30 0459)  Labs:  Recent Labs  01/22/16 1836 01/23/16 0202 01/24/16 0535  HGB 10.1* 9.4* 9.0*  HCT 32.0* 29.8* 28.0*  PLT 78* 107* 74*  LABPROT 16.1* 16.4*  --   INR 1.28 1.32  --   CREATININE 1.21 1.21  --     Estimated Creatinine Clearance: 55.9 mL/min (by C-G formula based on SCr of 1.21 mg/dL).   Medical History: Past Medical History:  Diagnosis Date  . AAA (abdominal aortic aneurysm) (HCC)   . Aortic stenosis    AVR 2011  . Arthritis    "hands" (01/23/2016)  . Atrial fibrillation (HCC)    "started in 09/2015" (01/23/2016)  . Cleft palate   . Dermatitis    poison ivy  . GERD (gastroesophageal reflux disease)   . Heart murmur    "before AVR"  . History of hiatal hernia   . Hyperlipidemia   . Interstitial lung disease (HCC)   . Iron (Fe) deficiency anemia    "had an infusion 07/2015" (01/23/2016)  . On home oxygen therapy    "6L; 24/7" (01/23/2016)  . Pulmonary fibrosis (HCC)   . PVD (peripheral vascular disease) (HCC) 01/22/2016  . Syncope and collapse 07/18/2014    Medications:  Prescriptions Prior to Admission  Medication Sig Dispense Refill Last Dose  . AMBULATORY NON FORMULARY MEDICATION Rollator rolling walker with seat.  Diagnosis Physical Deconditioning 799.3 1 Units 0 Taking  . diphenhydramine-acetaminophen (TYLENOL PM) 25-500 MG TABS tablet Take 1 tablet by mouth at bedtime as needed (for sleep).    01/21/2016 at pm  . docusate sodium (COLACE) 100 MG capsule Take 100 mg by mouth daily as needed for mild constipation.   Past Week at Unknown time  . finasteride (PROSCAR) 5 MG tablet Take 1 tablet by mouth  daily (Patient taking differently: Take 5 mg by mouth once a day) 90 tablet 0 01/22/2016 at 1700  . furosemide (LASIX) 20 MG tablet Take 1 tablet (20 mg total) by mouth every other day. (Patient taking differently: Take 10-20 mg by mouth See admin instructions. 10 mg every other day but increase to 20 mg if edema is evident) 45 tablet 3 01/22/2016 at am  . ketoconazole (NIZORAL) 2 % cream Apply 1 application topically 2 (two) times daily. To affected areas. (Patient taking differently: Apply 1 application topically 2 (two) times daily. TO AFFECTED AREAS OF FEET) 60 g 3 Past Week at Unknown time  . loratadine (CLARITIN) 10 MG tablet Take 10 mg by mouth every morning.    01/22/2016 at am  . midodrine (PROAMATINE) 2.5 MG tablet Take 1 tablet (2.5 mg total) by mouth 2 (two) times daily with a meal. (Patient taking differently: Take 2.5 mg by mouth 2 (two) times daily with a meal. ONLY TO BE TAKEN IF SYSTOLIC PRESSURE IS <120) 180 tablet 0 01/22/2016 at 1200  . Multiple Vitamin (MULTIVITAMIN) tablet Take 1 tablet by mouth daily.   01/22/2016 at am  . NON  FORMULARY Place 6 L into the nose continuous.    CONTINUOUS at CONTINUOUS  . pantoprazole (PROTONIX) 40 MG tablet TAKE 1 TABLET BY MOUTH TWICE DAILY BEFORE MEALS (Patient taking differently: Take 40 mg by mouth two times a day before meals) 180 tablet 0 01/22/2016 at 1700  . pravastatin (PRAVACHOL) 40 MG tablet Take 1 tablet by mouth at  bedtime (Patient taking differently: Take 40 mg by mouth at bedtime. Take 1 tablet by mouth at  bedtim) 90 tablet 3 01/22/2016 at 1700  . predniSONE (DELTASONE) 10 MG tablet Take 2 tablets (20 mg total) by mouth daily with breakfast. X 5 days (Patient taking differently: Take 10 mg by mouth daily with breakfast. ) 10  tablet 0 01/22/2016 at am  . apixaban (ELIQUIS) 5 MG TABS tablet Take 1 tablet (5 mg total) by mouth 2 (two) times daily. 60 tablet 11 ON HOLD at ON HOLD   Scheduled:  . enoxaparin (LOVENOX) injection  40 mg Subcutaneous Q24H  . finasteride  5 mg Oral Daily  . furosemide  20 mg Oral QODAY  . loratadine  10 mg Oral q morning - 10a  . midodrine  2.5 mg Oral BID WC  . multivitamin with minerals  1 tablet Oral Daily  . pantoprazole  40 mg Oral BID AC  . potassium chloride  20-40 mEq Oral Once  . pravastatin  40 mg Oral QHS  . predniSONE  20 mg Oral Q breakfast   Infusions:   PRN: acetaminophen **OR** acetaminophen, alum & mag hydroxide-simeth, guaiFENesin-dextromethorphan, hydrALAZINE, labetalol, metoprolol, ondansetron, oxyCODONE-acetaminophen, phenol Anti-infectives    None      Assessment: Patient is a 81 year old male with atrial fibrillation on apixaban admitted with bilateral ischemic foot changes. His last dose of apixaban was 12/28 at around 1700 and had been on hold for possible procedures. Patient also has known history of thrombocytopenia and some degree of anemia.   Given patient's recent apixaban dose and current platelet count of 74k/uL, will not give heparin bolus with initiation.   Goal of Therapy:  Heparin level 0.3-0.7 units/ml  APTT 66-102 s Monitor platelets by anticoagulation protocol: Yes   Plan:  Start heparin infusion at 1250 units/hr IV 8-hr heparin level and aPTT level  Daily HL, aPTT level, and CBC while on heparin  Monitor signs/symptoms of bleeding   Carylon PerchesMaggie Shuda, PharmD Acute Care Pharmacy Resident  Pager: [phone] 01/24/2016

## 2016-01-24 NOTE — Progress Notes (Signed)
ANTICOAGULATION CONSULT NOTE  Pharmacy Consult for Heparin  Indication: atrial fibrillation  Allergies  Allergen Reactions  . Pravastatin Sodium Other (See Comments)    Sneezing episodes, can take medication and tolerates reaction.  . Pirfenidone Nausea And Vomiting    Patient Measurements: Height: 6\' 1"  (185.4 cm) Weight: 194 lb 9.6 oz (88.3 kg) IBW/kg (Calculated) : 79.9 Heparin Dosing Weight: 88kg  Vital Signs: Temp: 97.6 F (36.4 C) (12/30 2039) Temp Source: Oral (12/30 2039) BP: 116/65 (12/30 2039) Pulse Rate: 78 (12/30 2039)  Labs:  Recent Labs  01/22/16 1836 01/23/16 0202 01/24/16 0535 01/24/16 1041 01/24/16 1838  HGB 10.1* 9.4* 9.0*  --   --   HCT 32.0* 29.8* 28.0*  --   --   PLT 78* 107* 74*  --   --   APTT  --   --   --  31 55*  LABPROT 16.1* 16.4*  --   --   --   INR 1.28 1.32  --   --   --   HEPARINUNFRC  --   --   --  1.08*  --   CREATININE 1.21 1.21  --   --   --     Estimated Creatinine Clearance: 55.9 mL/min (by C-G formula based on SCr of 1.21 mg/dL).   Infusions:  . heparin 1,250 Units/hr (01/24/16 1124)   PRN: acetaminophen **OR** acetaminophen, alum & mag hydroxide-simeth, guaiFENesin-dextromethorphan, hydrALAZINE, labetalol, metoprolol, ondansetron, oxyCODONE-acetaminophen, phenol Anti-infectives    None      Assessment: Patient is a 81 year old male with atrial fibrillation on apixaban admitted with bilateral ischemic foot changes. His last dose of apixaban was 12/28 at around 1700 and had been on hold for possible procedures. Patient also has known history of thrombocytopenia. Baseline heparin level is 1.08 (effect of recent apixiban) -aPTT= 55  Goal of Therapy:  Heparin level 0.3-0.7 units/ml  APTT 66-102 s Monitor platelets by anticoagulation protocol: Yes   Plan:   -Increase heparin to 1400 units/hr -Heparin level in 8 hours and daily wth CBC daily  Harland Germanndrew Najah Liverman, Pharm D 01/24/2016 8:55 PM

## 2016-01-25 LAB — CBC
HCT: 29.3 % — ABNORMAL LOW (ref 39.0–52.0)
Hemoglobin: 9.3 g/dL — ABNORMAL LOW (ref 13.0–17.0)
MCH: 26.5 pg (ref 26.0–34.0)
MCHC: 31.7 g/dL (ref 30.0–36.0)
MCV: 83.5 fL (ref 78.0–100.0)
PLATELETS: 73 10*3/uL — AB (ref 150–400)
RBC: 3.51 MIL/uL — ABNORMAL LOW (ref 4.22–5.81)
RDW: 18.9 % — AB (ref 11.5–15.5)
WBC: 21.6 10*3/uL — AB (ref 4.0–10.5)

## 2016-01-25 LAB — BASIC METABOLIC PANEL
ANION GAP: 9 (ref 5–15)
BUN: 21 mg/dL — ABNORMAL HIGH (ref 6–20)
CALCIUM: 8.8 mg/dL — AB (ref 8.9–10.3)
CO2: 34 mmol/L — AB (ref 22–32)
CREATININE: 1.21 mg/dL (ref 0.61–1.24)
Chloride: 94 mmol/L — ABNORMAL LOW (ref 101–111)
GFR, EST NON AFRICAN AMERICAN: 55 mL/min — AB (ref >60)
Glucose, Bld: 138 mg/dL — ABNORMAL HIGH (ref 65–99)
Potassium: 4.8 mmol/L (ref 3.5–5.1)
SODIUM: 137 mmol/L (ref 135–145)

## 2016-01-25 LAB — LIPID PANEL
Cholesterol: 151 mg/dL (ref 0–200)
HDL: 61 mg/dL (ref >40)
LDL Cholesterol: 69 mg/dL (ref 0–99)
Total CHOL/HDL Ratio: 2.5 RATIO
Triglycerides: 104 mg/dL (ref <150)
VLDL: 21 mg/dL (ref 0–40)

## 2016-01-25 LAB — HEPARIN LEVEL (UNFRACTIONATED): HEPARIN UNFRACTIONATED: 0.79 [IU]/mL — AB (ref 0.30–0.70)

## 2016-01-25 LAB — APTT: APTT: 83 s — AB (ref 24–36)

## 2016-01-25 MED ORDER — BISACODYL 5 MG PO TBEC
5.0000 mg | DELAYED_RELEASE_TABLET | Freq: Every day | ORAL | Status: DC
  Administered 2016-01-25 – 2016-01-28 (×3): 5 mg via ORAL
  Filled 2016-01-25 (×3): qty 1

## 2016-01-25 NOTE — Progress Notes (Addendum)
[facility] [place]  Subjective  - Feels like right foot is a little warmer.   Objective 117/73 70 97.5 F (36.4 C) (Oral) 18 100%  Intake/Output Summary (Last 24 hours) at 01/25/16 0759 Last data filed at 01/25/16 [id number]  Gross per 24 hour  Intake              720 ml  Output             1150 ml  Net             -430 ml    Doppler DP bilaterally Toes with slight erythema B Heart A fib Lungs non labored breathing  Assessment/Planning: Ischemic changes to B digits LE  Plan angiogram Tuesday By Dr. Edilia Boickson Continue IV heprin for A [name]  [name], [name], [name] 01/25/2016 7:59 AM --  Laboratory Lab Results:  Recent Labs  01/23/16 0202 01/24/16 0535  WBC 17.2* 13.2*  HGB 9.4* 9.0*  HCT 29.8* 28.0*  PLT 107* 74*   BMET  Recent Labs  01/22/16 1836 01/23/16 0202  NA 140 140  K 5.1 4.1  CL 97* 96*  CO2 35* 33*  GLUCOSE 107* 100*  BUN 32* 27*  CREATININE 1.21 1.21  CALCIUM 9.2 8.8*    COAG Lab Results  Component Value Date   INR 1.32 01/23/2016   INR 1.28 01/22/2016   INR 1.01 12/26/2014   No results found for: PTT  Agree with the above, plan for angio on Tuesday  WElls [name]

## 2016-01-25 NOTE — Progress Notes (Signed)
ANTICOAGULATION CONSULT NOTE  Pharmacy Consult for Heparin  Indication: atrial fibrillation  Allergies  Allergen Reactions  . Pravastatin Sodium Other (See Comments)    Sneezing episodes, can take medication and tolerates reaction.  . Pirfenidone Nausea And Vomiting    Patient Measurements: Height: 6\' 1"  (185.4 cm) Weight: 194 lb 9.6 oz (88.3 kg) IBW/kg (Calculated) : 79.9 Heparin Dosing Weight: 88kg  Vital Signs: Temp: 97.5 F (36.4 C) (12/31 0554) Temp Source: Oral (12/31 0554) BP: 107/76 (12/31 1104) Pulse Rate: 80 (12/31 1104)  Labs:  Recent Labs  01/22/16 1836 01/23/16 0202 01/24/16 0535 01/24/16 1041 01/24/16 1838 01/25/16 0935  HGB 10.1* 9.4* 9.0*  --   --  9.3*  HCT 32.0* 29.8* 28.0*  --   --  29.3*  PLT 78* 107* 74*  --   --  PENDING  APTT  --   --   --  31 55* 83*  LABPROT 16.1* 16.4*  --   --   --   --   INR 1.28 1.32  --   --   --   --   HEPARINUNFRC  --   --   --  1.08*  --  0.79*  CREATININE 1.21 1.21  --   --   --  1.21    Estimated Creatinine Clearance: 55.9 mL/min (by C-G formula based on SCr of 1.21 mg/dL).   Infusions:  . heparin 1,400 Units/hr (01/25/16 0425)   PRN: acetaminophen **OR** acetaminophen, alum & mag hydroxide-simeth, guaiFENesin-dextromethorphan, hydrALAZINE, labetalol, metoprolol, ondansetron, oxyCODONE-acetaminophen, phenol Anti-infectives    None      Assessment: Patient is a 81 year old male with atrial fibrillation on apixaban admitted with bilateral ischemic foot changes. His last dose of apixaban was 12/28 at around 1700 and had been on hold for possible procedures. Patient also has known history of thrombocytopenia. Baseline heparin level is 1.08 (effect of recent apixiban) -aPTT= 83 and at goal. Heparin level= 0.79 and trend down  Goal of Therapy:  Heparin level 0.3-0.7 units/ml  APTT 66-102 s Monitor platelets by anticoagulation protocol: Yes   Plan:   -Continue heparin at 1400 units/hr -heparin level, aPTT  and CBC daily  Harland GermanAndrew Mariaisabel Bodiford, Pharm D 01/25/2016 11:35 AM

## 2016-01-26 LAB — BASIC METABOLIC PANEL
Anion gap: 11 (ref 5–15)
BUN: 23 mg/dL — AB (ref 6–20)
CALCIUM: 8.7 mg/dL — AB (ref 8.9–10.3)
CO2: 32 mmol/L (ref 22–32)
CREATININE: 1.16 mg/dL (ref 0.61–1.24)
Chloride: 95 mmol/L — ABNORMAL LOW (ref 101–111)
GFR calc Af Amer: >60 mL/min (ref >60)
GFR, EST NON AFRICAN AMERICAN: 58 mL/min — AB (ref >60)
GLUCOSE: 86 mg/dL (ref 65–99)
Potassium: 4.1 mmol/L (ref 3.5–5.1)
SODIUM: 138 mmol/L (ref 135–145)

## 2016-01-26 LAB — GLUCOSE, CAPILLARY: GLUCOSE-CAPILLARY: 155 mg/dL — AB (ref 65–99)

## 2016-01-26 LAB — CBC
HCT: 28 % — ABNORMAL LOW (ref 39.0–52.0)
Hemoglobin: 9 g/dL — ABNORMAL LOW (ref 13.0–17.0)
MCH: 26.2 pg (ref 26.0–34.0)
MCHC: 32.1 g/dL (ref 30.0–36.0)
MCV: 81.6 fL (ref 78.0–100.0)
PLATELETS: 76 10*3/uL — AB (ref 150–400)
RBC: 3.43 MIL/uL — ABNORMAL LOW (ref 4.22–5.81)
RDW: 18 % — AB (ref 11.5–15.5)
WBC: 21.3 10*3/uL — AB (ref 4.0–10.5)

## 2016-01-26 LAB — APTT: APTT: 86 s — AB (ref 24–36)

## 2016-01-26 LAB — HEPARIN LEVEL (UNFRACTIONATED): HEPARIN UNFRACTIONATED: 0.89 [IU]/mL — AB (ref 0.30–0.70)

## 2016-01-26 MED ORDER — DEXTROSE 5 % IV SOLN: 1.5000 g | INTRAVENOUS | Status: AC

## 2016-01-26 NOTE — Progress Notes (Signed)
ANTICOAGULATION CONSULT NOTE  Pharmacy Consult for Heparin  Indication: atrial fibrillation  Allergies  Allergen Reactions  . Pravastatin Sodium Other (See Comments)    Sneezing episodes, can take medication and tolerates reaction.  . Pirfenidone Nausea And Vomiting    Patient Measurements: Height: 6\' 1"  (185.4 cm) Weight: 194 lb 9.6 oz (88.3 kg) IBW/kg (Calculated) : 79.9 Heparin Dosing Weight: 88kg  Vital Signs: Temp: 97.8 F (36.6 C) (01/01 0507) Temp Source: Oral (01/01 0507) BP: 90/51 (01/01 0507) Pulse Rate: 83 (01/01 0507)  Labs:  Recent Labs  01/24/16 0535  01/24/16 1041 01/24/16 1838 01/25/16 0935 01/26/16 0230  HGB 9.0*  --   --   --  9.3* 9.0*  HCT 28.0*  --   --   --  29.3* 28.0*  PLT 74*  --   --   --  73* 76*  APTT  --   < > 31 55* 83* 86*  HEPARINUNFRC  --   --  1.08*  --  0.79* 0.89*  CREATININE  --   --   --   --  1.21 1.16  < > = values in this interval not displayed.  Estimated Creatinine Clearance: 58.4 mL/min (by C-G formula based on SCr of 1.16 mg/dL).   Infusions:  . heparin 1,400 Units/hr (01/25/16 2242)   Assessment: Patient is a 82 year old male with atrial fibrillation on apixaban admitted with bilateral ischemic foot changes. His last dose of apixaban was 12/28 at around 1700 and had been on hold for possible procedures. Patient also has known history of thrombocytopenia. Baseline heparin level is 1.08 (effect of recent apixiban). APTT is still at goal at 86 and heparin level remains elevated and has increased slightly today.   Goal of Therapy:  Heparin level 0.3-0.7 units/ml  APTT 66-102 Monitor platelets by anticoagulation protocol: Yes   Plan:   Decrease heparin gtt slightly to 1350 units/hr since heparin level increased a bit today Continue daily heparin level, aPTT and CBC  Lysle Pearlachel Bernardine Langworthy, PharmD, BCPS Pager # [phone] 01/26/2016 8:07 AM

## 2016-01-26 NOTE — Progress Notes (Addendum)
[facility] [place]  Subjective  - No changes in symptoms, right foot cooler than left.   Objective (!) 90/51 83 97.8 F (36.6 C) (Oral) 18 98%  Intake/Output Summary (Last 24 hours) at 01/26/16 0736 Last data filed at 01/26/16 0500  Gross per 24 hour  Intake              960 ml  Output             1750 ml  Net             -790 ml   CTA 01/23/2016  IMPRESSION: Right femoral popliteal disease with either high-grade stenosis or short segment occlusion of the distal SFA within the canal. Popliteal artery is patent via direct flow or reconstitution.  Right-sided tibial disease, with single vessel peroneal artery patent to the ankle and distal occlusion of anterior tibial and posterior tibial arteries.  Left femoral popliteal disease with calcified and soft plaque within the adductor canal contributing to perhaps 50% stenosis.  Left-sided tibial disease, although there appears to be 3 vessels patent to the ankle.  Aortic atherosclerosis with small aneurysm of the infrarenal abdominal aorta. Current ACR criteria recommend followup by ultrasound in 3 years. This recommendation follows ACR consensus guidelines: White Paper of the ACR Incidental Findings Committee II on Vascular Findings. J Am Coll Radiol 2013; 10:789-794.  Bilateral renal artery atherosclerotic disease. Degree of stenosis difficult to assess, with perhaps 50% narrowing of the bilateral renal artery origin.  Doppler DP bilaterally Toes with slight erythema B Heart A fib Lungs non labored breathing  Assessment/Planning: Ischemic changes to B digits LE R > L Right femoral popliteal disease with either high-grade stenosis or short segment occlusion of the distal SFA within the canal. Popliteal artery is patent via direct flow or reconstitution. Plan angiogram with possible intervention by Dr. Waverly Ferrarihristopher Dickson NPO past MN Continue IV heparin stop 1 hour prior to  procedure  Thomasena EdisCOLLINS, [facility] 01/26/2016 7:36 AM --  Laboratory Lab Results:  Recent Labs  01/25/16 0935 01/26/16 0230  WBC 21.6* 21.3*  HGB 9.3* 9.0*  HCT 29.3* 28.0*  PLT 73* 76*   BMET  Recent Labs  01/25/16 0935 01/26/16 0230  NA 137 138  K 4.8 4.1  CL 94* 95*  CO2 34* 32  GLUCOSE 138* 86  BUN 21* 23*  CREATININE 1.21 1.16  CALCIUM 8.8* 8.7*    COAG Lab Results  Component Value Date   INR 1.32 01/23/2016   INR 1.28 01/22/2016   INR 1.01 12/26/2014   No results found for: PTT   I agree with the above.  I have seen and examined the patient.  He continues to have a wound on the right toe.  Plan for angio tomorrow.  Durene CalWells Corderius Saraceni

## 2016-01-27 ENCOUNTER — Encounter (HOSPITAL_COMMUNITY)
Admission: EM | Disposition: A | Discharge status: Home / Self Care | Payer: Self-pay | Source: Home / Self Care | Attending: Vascular Surgery

## 2016-01-27 ENCOUNTER — Encounter (HOSPITAL_COMMUNITY): Payer: Self-pay | Admitting: Vascular Surgery

## 2016-01-27 DIAGNOSIS — I743 Embolism and thrombosis of arteries of the lower extremities: Secondary | ICD-10-CM

## 2016-01-27 HISTORY — PX: PERIPHERAL VASCULAR CATHETERIZATION: SHX172C

## 2016-01-27 LAB — CBC
HCT: 28.7 % — ABNORMAL LOW (ref 39.0–52.0)
Hemoglobin: 9.3 g/dL — ABNORMAL LOW (ref 13.0–17.0)
MCH: 26.6 pg (ref 26.0–34.0)
MCHC: 32.4 g/dL (ref 30.0–36.0)
MCV: 82 fL (ref 78.0–100.0)
PLATELETS: 92 10*3/uL — AB (ref 150–400)
RBC: 3.5 MIL/uL — AB (ref 4.22–5.81)
RDW: 18.1 % — AB (ref 11.5–15.5)
WBC: 15.4 10*3/uL — AB (ref 4.0–10.5)

## 2016-01-27 LAB — BASIC METABOLIC PANEL
Anion gap: 8 (ref 5–15)
BUN: 21 mg/dL — AB (ref 6–20)
CO2: 33 mmol/L — ABNORMAL HIGH (ref 22–32)
CREATININE: 1.2 mg/dL (ref 0.61–1.24)
Calcium: 8.8 mg/dL — ABNORMAL LOW (ref 8.9–10.3)
Chloride: 98 mmol/L — ABNORMAL LOW (ref 101–111)
GFR calc non Af Amer: 56 mL/min — ABNORMAL LOW (ref >60)
Glucose, Bld: 92 mg/dL (ref 65–99)
Potassium: 4.1 mmol/L (ref 3.5–5.1)
SODIUM: 139 mmol/L (ref 135–145)

## 2016-01-27 LAB — HEPARIN LEVEL (UNFRACTIONATED): HEPARIN UNFRACTIONATED: 0.69 [IU]/mL (ref 0.30–0.70)

## 2016-01-27 LAB — APTT: APTT: 77 s — AB (ref 24–36)

## 2016-01-27 LAB — PROTIME-INR
INR: 1.12
Prothrombin Time: 14.4 seconds (ref 11.4–15.2)

## 2016-01-27 LAB — POCT ACTIVATED CLOTTING TIME
Activated Clotting Time: 208 seconds
Activated Clotting Time: 224 seconds
Activated Clotting Time: 175 seconds

## 2016-01-27 SURGERY — ABDOMINAL AORTOGRAM W/LOWER EXTREMITY
Anesthesia: LOCAL

## 2016-01-27 MED ORDER — HEPARIN (PORCINE) IN NACL 100-0.45 UNIT/ML-% IJ SOLN
1350.0000 [IU]/h | INTRAMUSCULAR | Status: AC
  Administered 2016-01-27: 1350 [IU]/h via INTRAVENOUS
  Filled 2016-01-27: qty 250

## 2016-01-27 MED ORDER — HEPARIN (PORCINE) IN NACL 2-0.9 UNIT/ML-% IJ SOLN
INTRAMUSCULAR | Status: AC
  Filled 2016-01-27: qty 1000

## 2016-01-27 MED ORDER — ACETAMINOPHEN 325 MG PO TABS
650.0000 mg | ORAL_TABLET | ORAL | Status: DC | PRN
  Administered 2016-01-27: 650 mg via ORAL
  Filled 2016-01-27: qty 2

## 2016-01-27 MED ORDER — MIDAZOLAM HCL 2 MG/2ML IJ SOLN
INTRAMUSCULAR | Status: AC
  Filled 2016-01-27: qty 2

## 2016-01-27 MED ORDER — MIDAZOLAM HCL 2 MG/2ML IJ SOLN
INTRAMUSCULAR | Status: DC | PRN
  Administered 2016-01-27: 0.5 mg via INTRAVENOUS

## 2016-01-27 MED ORDER — HEPARIN SODIUM (PORCINE) 1000 UNIT/ML IJ SOLN
INTRAMUSCULAR | Status: DC | PRN
  Administered 2016-01-27: 7000 [IU] via INTRAVENOUS

## 2016-01-27 MED ORDER — LIDOCAINE HCL (PF) 1 % IJ SOLN
INTRAMUSCULAR | Status: DC | PRN
  Administered 2016-01-27: 15 mL

## 2016-01-27 MED ORDER — HEPARIN SODIUM (PORCINE) 1000 UNIT/ML IJ SOLN
INTRAMUSCULAR | Status: AC
  Filled 2016-01-27: qty 1

## 2016-01-27 MED ORDER — IODIXANOL 320 MG/ML IV SOLN
INTRAVENOUS | Status: DC | PRN
  Administered 2016-01-27: 113 mL via INTRA_ARTERIAL

## 2016-01-27 MED ORDER — ASPIRIN EC 81 MG PO TBEC
81.0000 mg | DELAYED_RELEASE_TABLET | Freq: Every day | ORAL | Status: DC
  Administered 2016-01-28: 81 mg via ORAL
  Filled 2016-01-27: qty 1

## 2016-01-27 MED ORDER — ONDANSETRON HCL 4 MG/2ML IJ SOLN: 4.0000 mg | Freq: Four times a day (QID) | INTRAMUSCULAR | Status: DC | PRN

## 2016-01-27 MED ORDER — FENTANYL CITRATE (PF) 100 MCG/2ML IJ SOLN
INTRAMUSCULAR | Status: AC
  Filled 2016-01-27: qty 2

## 2016-01-27 MED ORDER — FENTANYL CITRATE (PF) 100 MCG/2ML IJ SOLN
INTRAMUSCULAR | Status: DC | PRN
  Administered 2016-01-27: 25 ug via INTRAVENOUS

## 2016-01-27 MED ORDER — APIXABAN 5 MG PO TABS
5.0000 mg | ORAL_TABLET | Freq: Two times a day (BID) | ORAL | Status: DC
  Administered 2016-01-28: 5 mg via ORAL
  Filled 2016-01-27: qty 1

## 2016-01-27 MED ORDER — LIDOCAINE HCL (PF) 1 % IJ SOLN
INTRAMUSCULAR | Status: AC
  Filled 2016-01-27: qty 30

## 2016-01-27 MED ORDER — HEPARIN (PORCINE) IN NACL 2-0.9 UNIT/ML-% IJ SOLN
INTRAMUSCULAR | Status: DC | PRN
  Administered 2016-01-27: 1000 mL

## 2016-01-27 SURGICAL SUPPLY — 24 items
BALLN ARMADA 4X20X135 (BALLOONS) ×3
BALLN MUSTANG 6X80X135 (BALLOONS) ×3
BALLOON ARMADA 4X20X135 (BALLOONS) ×2 IMPLANT
BALLOON MUSTANG 6X80X135 (BALLOONS) ×2 IMPLANT
CATH ANGIO 5F PIGTAIL 65CM (CATHETERS) ×3 IMPLANT
CATH STRAIGHT 5FR 65CM (CATHETERS) ×3 IMPLANT
CATH TEMPO 5F RIM 65CM (CATHETERS) ×3 IMPLANT
COVER PRB 48X5XTLSCP FOLD TPE (BAG) ×2 IMPLANT
COVER PROBE 5X48 (BAG) ×1
KIT ENCORE 26 ADVANTAGE (KITS) ×3 IMPLANT
KIT MICROINTRODUCER STIFF 5F (SHEATH) ×3 IMPLANT
KIT PV (KITS) ×3 IMPLANT
SHEATH PINNACLE 5F 10CM (SHEATH) ×3 IMPLANT
SHEATH PINNACLE MP 7F 45CM (SHEATH) ×3 IMPLANT
STENT INNOVA 7X80X130 (Permanent Stent) ×3 IMPLANT
STOPCOCK MORSE 400PSI 3WAY (MISCELLANEOUS) ×3 IMPLANT
SYRINGE MEDRAD AVANTA MACH 7 (SYRINGE) ×3 IMPLANT
TAPE VIPERTRACK RADIOPAQ (MISCELLANEOUS) ×2 IMPLANT
TAPE VIPERTRACK RADIOPAQUE (MISCELLANEOUS) ×1
TRANSDUCER W/STOPCOCK (MISCELLANEOUS) ×3 IMPLANT
TRAY PV CATH (CUSTOM PROCEDURE TRAY) ×3 IMPLANT
TUBING CIL FLEX 10 FLL-RA (TUBING) ×3 IMPLANT
WIRE HITORQ VERSACORE ST 145CM (WIRE) ×3 IMPLANT
WIRE VERSACORE LOC 115CM (WIRE) ×3 IMPLANT

## 2016-01-27 NOTE — Op Note (Signed)
 PATIENT: [name]   MRN: [id number] DOB: 11/29/1934    DATE OF PROCEDURE: 01/27/2016  INDICATIONS: [name] is a 82 y.o. 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 He presents for arteriography and possible intervention.  PROCEDURE:  1. Ultrasound-guided access to the left common femoral artery 2. Aortogram with bilateral iliac arteriogram 3. Selective catheterization of the right external iliac artery with right lower extremity runoff. 4. Selective catheterization of the right superficial femoral artery 5. Angioplasty of right superficial femoral artery 6. Stenting of right superficial femoral artery with 7 mm x 80 mm self-expanding stent. 7. Post dilatation with 6 mm x 80 mm balloon 8. Retrograde left femoral arteriogram  SURGEON:  S. , MD, FACS  ANESTHESIA: Local with sedation   EBL: Minimal  TECHNIQUE: The patient was taken to the peripheral vascular lab and was sedated. The period of conscious sedation was 64 minutes..  During that time period, I was present face-to-face 100% of the time.  The patient was administered (0.5 mg of Versed and 25 g of fentanyl). The patient's heart rate, blood pressure, and oxygen saturation were monitored by the nurse continuously during the procedure.  Both groins were prepped and draped in usual sterile fashion. Under ultrasound guidance, the left common femoral artery was cannulated with a micropuncture needle and a micropuncture sheath introduced over a wire. This was exchanged for a 5 French sheath over a Benson wire. A pigtail catheter was positioned at the L1 vertebral body and flush aortogram obtained. The catheter was then positioned above the aortic bifurcation and the catheter exchanged for a rim catheter. This was positioned into the right common iliac artery. A wire was advanced down into the external iliac artery and the rim catheter exchanged for a straight catheter. Selective right  external iliac arteriogram was obtained with right lower extremity runoff.  This demonstrated a tight focal 80% stenosis of the distal superficial femoral artery. I elected to address this with balloon angioplasty. I exchanged the 5 French sheath for a long 7 French destination sheath. This was passed over the bifurcation down into the superficial femoral artery. The patient was heparinized. ACT was 224.  given this severity of the stenosis I elected to predilate this with a 5 mm x 2 cm balloon. This was inflated to 10 atm for 1 minute. There was residual stenosis after this and I went back with a 7 mm x 80 mm self-expanding stent which was positioned across the stenosis and also a smaller stenosis further down stream. The stent was deployed without difficulty. I then did postdilatation with a 6 mm x 80 mm balloon. This was inflated to nominal pressure for 1 minute. Completion films showed no residual stenosis.  Next the sheath was retracted into the left external iliac artery in a retrograde iliac arteriogram was obtained.    FINDINGS:  1. There are single renal arteries bilaterally with a 50% smooth left renal artery stenosis. Both common iliacs, external iliac arteries and hypogastric arteries are patent. 2. On the right side, which is the symptomatic side, the common femoral, deep femoral, and superficial femoral artery proximally are widely patent. There is mild diffuse disease. There is a tight focal 95% stenosis of the distal right superficial femoral artery and then a small tandem lesion of the proximally 40% below that. These were successfully addressed with balloon angioplasty and stenting as described above. Below that there is moderate diffuse tibial disease. The anterior tibial appeared arteries   are patent. The posterior tibial artery is occluded distally. 3. On the left side, the common femoral, deep femoral, superficial femoral, and popliteal arteries are patent. There is mild diffuse  disease throughout these arteries. There is three-vessel runoff on the right via the anterior tibial posterior tibial and peroneal arteries. There is moderate diffuse disease throughout all the tibials and the posterior tibial artery is occluded distally and then reconstitutes in the foot.  PRE: 95% POST: 0% residual stenosis STENT: A 7 mm x 80 mm self-expanding stent``  Deitra Mayo, MD, FACS [facility] [place]  DATE OF DICTATION:   01/27/2016

## 2016-01-27 NOTE — Interval H&P Note (Signed)
History and Physical Interval Note:  01/27/2016 8:08 AM  [name]  has presented today for surgery, with the diagnosis of Ischemic toes bilaterally  The various methods of treatment have been discussed with the patient and family. After consideration of risks, benefits and other options for treatment, the patient has consented to  Procedure(s): Abdominal Aortogram w/ Bilateral Lower Extremity Runoff (N/A) as a surgical intervention .  The patient's history has been reviewed, patient examined, no change in status, stable for surgery.  I have reviewed the patient's chart and labs.  Questions were answered to the patient's satisfaction.     Waverly Ferrariickson, Christopher

## 2016-01-27 NOTE — H&P (View-Only) (Signed)
VASCULAR SURGERY ADDENDUM:  I reviewed the CT scan images. On my review, he appears to have some disease in the right superficial femoral artery at the adductor canal with a short segment occlusion here. 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 He likely had focal disease in this area that went on to thrombose. His Eliquis is on hold. I tentatively plan to proceed with an arteriogram on Tuesday to see if potentially this is something that could be addressed with angioplasty and stenting.  Cardiology has been consult it. In addition I consult hematology because of his initial thrombocytopenia. If it's okay with them and we will start heparin given that his Eliquis is on hold. I'll make further recommendations pending the results of his arteriogram on Tuesday.  Lorraine Cimmino, MD, FACS Beeper [phone] Office: [phone]  

## 2016-01-27 NOTE — Progress Notes (Addendum)
ANTICOAGULATION CONSULT NOTE  Pharmacy Consult for Heparin  Indication: atrial fibrillation  Allergies  Allergen Reactions  . Pravastatin Sodium Other (See Comments)    Sneezing episodes, can take medication and tolerates reaction.  . Pirfenidone Nausea And Vomiting    Patient Measurements: Height: 6\' 1"  (185.4 cm) Weight: 194 lb 9.6 oz (88.3 kg) IBW/kg (Calculated) : 79.9 Heparin Dosing Weight: 88kg  Vital Signs: Temp: 97.8 F (36.6 C) (01/02 0434) Temp Source: Oral (01/02 0434) BP: 97/60 (01/02 1152) Pulse Rate: 81 (01/02 1152)  Labs:  Recent Labs  01/25/16 0935 01/26/16 0230 01/27/16 0436  HGB 9.3* 9.0* 9.3*  HCT 29.3* 28.0* 28.7*  PLT 73* 76* 92*  APTT 83* 86* 77*  LABPROT  --   --  14.4  INR  --   --  1.12  HEPARINUNFRC 0.79* 0.89* 0.69  CREATININE 1.21 1.16 1.20    Estimated Creatinine Clearance: 56.4 mL/min (by C-G formula based on SCr of 1.2 mg/dL).   Infusions:  . heparin Stopped (01/27/16 0747)   Assessment: Patient is a 82 year old male with atrial fibrillation on apixaban admitted with bilateral ischemic foot changes. His last dose of apixaban was 12/28 at around 1700 and had been on hold for procedures and started on heparin. Patient also has known history of thrombocytopenia. Baseline heparin level is 1.08 (effect of recent apixaban), still not correlating with aPTT.  Now s/p angiogram with stent placed on 1/2. Pharmacy consulted to resume heparin 8h post-sheath removal - removed at 1055 per cath note. Noted hematoma forming in groin area prior to leaving cath holding, now resolved. Previously therapeutic on heparin at 1350 units/h. Hg low stable, plt up to 92. No bleeding issues documented.   Goal of Therapy:  Heparin level 0.3-0.7 units/ml  APTT 66-102 Monitor platelets by anticoagulation protocol: Yes   Plan:   Resume heparin at previous rate 1350 units/hr - start 8 hours post-sheath removal with no bolus 8h aPTT Daily heparin level,  aPTT, and CBC Monitor for s/sx bleeding Apixaban on hold   Babs BertinHaley Brandom Kerwin, PharmD, BCPS Clinical Pharmacist 01/27/2016 11:59 AM   ADDENDUM:  Pharmacy consulted to resume apixaban (PTA for afib) in the AM - dose PTA appropriate. Will discontinue heparin at time of 1st dose of apixaban.   Plan: D/c heparin at time of 1st dose of apixaban 5mg  PO BID - to start 1/3 AM Monitor CBC, s/sx bleeding   Babs BertinHaley Hikaru Delorenzo, PharmD, BCPS Clinical Pharmacist 01/27/2016 12:32 PM

## 2016-01-27 NOTE — Progress Notes (Addendum)
Site area: left groin Site Prior to Removal:  Level 0 Pressure Applied For: 25 minutes + 10 minutes Manual:   yes Patient Status During Pull:  stable Post Pull Site:  Level 0 Post Pull Instructions Given:  yes  Post Pull Pulses Present:  yes Dressing Applied:  yes Bedrest begins @  1110 Comments: started to form hematoma lt groin just before leaving holding. Additional 10 minutes manual hold. Site level 0. Hematoma resolved. BP 123/64, HR 85

## 2016-01-27 NOTE — Interval H&P Note (Signed)
History and Physical Interval Note:  01/27/2016 8:09 AM  Roy Bundeobert G Chan  has presented today for surgery, with the diagnosis of Ischemic toes bilaterally  The various methods of treatment have been discussed with the patient and family. After consideration of risks, benefits and other options for treatment, the patient has consented to  Procedure(s): Abdominal Aortogram w/ Bilateral Lower Extremity Runoff (N/A) as a surgical intervention .  The patient's history has been reviewed, patient examined, no change in status, stable for surgery.  I have reviewed the patient's chart and labs.  Questions were answered to the patient's satisfaction.     Waverly Ferrariickson, Christopher

## 2016-01-28 ENCOUNTER — Telehealth: Payer: Self-pay | Admitting: Vascular Surgery

## 2016-01-28 DIAGNOSIS — I739 Peripheral vascular disease, unspecified: Secondary | ICD-10-CM

## 2016-01-28 LAB — BASIC METABOLIC PANEL
Anion gap: 8 (ref 5–15)
BUN: 19 mg/dL (ref 6–20)
CO2: 33 mmol/L — ABNORMAL HIGH (ref 22–32)
Calcium: 8.7 mg/dL — ABNORMAL LOW (ref 8.9–10.3)
Chloride: 98 mmol/L — ABNORMAL LOW (ref 101–111)
Creatinine, Ser: 1.12 mg/dL (ref 0.61–1.24)
Glucose, Bld: 98 mg/dL (ref 65–99)
Potassium: 4.8 mmol/L (ref 3.5–5.1)
SODIUM: 139 mmol/L (ref 135–145)

## 2016-01-28 LAB — CBC
HEMATOCRIT: 27.2 % — AB (ref 39.0–52.0)
HEMOGLOBIN: 8.9 g/dL — AB (ref 13.0–17.0)
MCH: 26.8 pg (ref 26.0–34.0)
MCHC: 32.7 g/dL (ref 30.0–36.0)
MCV: 81.9 fL (ref 78.0–100.0)
Platelets: 77 10*3/uL — ABNORMAL LOW (ref 150–400)
RBC: 3.32 MIL/uL — ABNORMAL LOW (ref 4.22–5.81)
RDW: 18.3 % — ABNORMAL HIGH (ref 11.5–15.5)
WBC: 15.6 10*3/uL — ABNORMAL HIGH (ref 4.0–10.5)

## 2016-01-28 LAB — APTT: aPTT: 54 seconds — ABNORMAL HIGH (ref 24–36)

## 2016-01-28 MED ORDER — MIDODRINE HCL 2.5 MG PO TABS: 2.5000 mg | ORAL_TABLET | Freq: Two times a day (BID) | ORAL | Status: AC

## 2016-01-28 MED ORDER — FUROSEMIDE 20 MG PO TABS: 10.0000 mg | ORAL_TABLET | ORAL | Status: AC

## 2016-01-28 MED ORDER — PANTOPRAZOLE SODIUM 40 MG PO TBEC: DELAYED_RELEASE_TABLET | ORAL | 0 refills | Status: AC

## 2016-01-28 MED ORDER — CEPHALEXIN 500 MG PO CAPS: 500.0000 mg | ORAL_CAPSULE | Freq: Three times a day (TID) | ORAL | 0 refills | Status: DC

## 2016-01-28 MED ORDER — METOPROLOL TARTRATE 25 MG PO TABS: 25.0000 mg | ORAL_TABLET | Freq: Two times a day (BID) | ORAL | 3 refills | Status: DC

## 2016-01-28 MED ORDER — FINASTERIDE 5 MG PO TABS: ORAL_TABLET | ORAL | 0 refills | Status: AC

## 2016-01-28 MED ORDER — ACETAMINOPHEN 325 MG PO TABS: 650.0000 mg | ORAL_TABLET | ORAL | Status: AC | PRN

## 2016-01-28 MED ORDER — PREDNISONE 10 MG PO TABS: 10.0000 mg | ORAL_TABLET | Freq: Every day | ORAL | Status: DC

## 2016-01-28 MED ORDER — PRAVASTATIN SODIUM 40 MG PO TABS: 40.0000 mg | ORAL_TABLET | Freq: Every day | ORAL | Status: AC

## 2016-01-28 MED ORDER — KETOCONAZOLE 2 % EX CREA: 1.0000 "application " | TOPICAL_CREAM | Freq: Two times a day (BID) | CUTANEOUS | Status: AC

## 2016-01-28 MED ORDER — ASPIRIN 81 MG PO TBEC: 81.0000 mg | DELAYED_RELEASE_TABLET | Freq: Every day | ORAL | Status: AC

## 2016-01-28 NOTE — Progress Notes (Signed)
ANTICOAGULATION CONSULT NOTE - Follow Up Consult  Pharmacy Consult for heparin Indication: atrial fibrillation  Labs:  Recent Labs  01/25/16 0935 01/26/16 0230 01/27/16 0436 01/28/16 0353  HGB 9.3* 9.0* 9.3* 8.9*  HCT 29.3* 28.0* 28.7* 27.2*  PLT 73* 76* 92* 77*  APTT 83* 86* 77* 54*  LABPROT  --   --  14.4  --   INR  --   --  1.12  --   HEPARINUNFRC 0.79* 0.89* 0.69  --   CREATININE 1.21 1.16 1.20 1.12    Assessment/Plan:  81yo male subtherapeutic on heparin after resumed but was previously therapeutic at current rate w/ plan to resume Eliquis this am. Will continue gtt at current rate until off.   Roy GamblesVeronda Timmy Chan, PharmD, BCPS  01/28/2016,5:30 AM

## 2016-01-28 NOTE — Discharge Summary (Signed)
[facility] Discharge Summary  [name] 04-01-1934 82 y.o. male  [id number]  Admission Date: 01/22/2016  Discharge Date: 01/28/2016  Physician: Waverly Ferrarihristopher Dickson, MD  Admission Diagnosis: Pain of right lower extremity [M79.604]  HPI:   This is a 82 y.o. male who has been followed in our office by Dr. Arbie CookeyEarly in the past with a small aneurysm and bilateral carotid disease. He noted the gradual onset of pain in both feet about a month ago. Approximately a week ago he began complaining of increasing pain in the right foot and the left foot. The symptoms were more significant on the right side. He also noted discoloration of his toes. He was sent for vascular evaluation.  There is no history of claudication prior to this however, given his activity is very limited. According to his wife he walks from the bed to the bathroom to the chair essentially. I do not get any history of rest pain. He denies any history of nonhealing ulcers.  He has a complicated medical history. He is on home O2 because of pulmonary fibrosis. He had an aortic valve replacement for aortic stenosis in 2011. This was a pericardial tissue valve. He developed atrial fibrillation this year and is now on Eliquis (5 mg by mouth twice a day).  He had an ultrasound of his abdomen in August 2017 which shows a 3 cm infrarenal abdominal aortic aneurysm.  He had a carotid duplex scan in August 2017 which shows a 60-79% right carotid stenosis with a 40-59% left carotid stenosis.  Hospital Course:  The patient was admitted to the hospital and his Eliquis was on hold. Given his thrombocytopenia, heparin was not ordered. Hematology consult was obtained. Hematology felt it was ok to give heparin.  A CT angiogram of the aorta with runoff was ordered. There was some diseases in the right superficial femoral artery at the adductor canal with a short segment occlusion. 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 An arteriogram was planned for 01/27/16.  Cardiology was consulted in the event that he needed any surgical intervention. Cardiology felt that he did not need an ischemic workup prior to vascular procedure. An ECHO was repeated.  Results from ECHO: - Left ventricle: The cavity size was normal. Wall thickness was normal. Systolic function was normal. The estimated ejection fraction was in the range of 50% to 55%. Wall motion was normal; there were no regional wall motion abnormalities. Doppler parameters are consistent with high ventricular filling pressure. - Ventricular septum: Septal motion showed paradox. These changes are consistent with a post-thoracotomy state. - Aortic valve: A bioprosthesis was present and functioning normally. - Mitral valve: Calcified annulus. Mildly thickened leaflets . - Pulmonary arteries: PA peak pressure: 32 mm Hg (S).  Metoprolol 25 mg bid was added for persistent atrial fibrillation.   The patient was taken to the PV lab on 01/27/16 and underwent 1. Ultrasound-guided access to the left common femoral artery 2. Aortogram with bilateral iliac arteriogram 3. Selective catheterization of the right external iliac artery with right lower extremity runoff. 4. Selective catheterization of the right superficial femoral artery 5. Angioplasty of right superficial femoral artery 6. Stenting of right superficial femoral artery with 7 mm x 80 mm self-expanding stent. 7. Post dilatation with 6 mm x 80 mm balloon 8. Retrograde left femoral arteriogram  The patient tolerated the procedure well and was transported to the recovery area in stable condition.   POD 1:  The patient was doing well. His left groin was without hematoma. His renal function and thrombocytopenia were stable. Keflex was added for mild cellulitis of right foot with open wound between right 1st and 2nd toes. Dressing changes with 2x2 gauze  between 1st and 2nd toes was started. He had brisk right DP and peroneal signals. His Eliquis was resumed. He was discharged home on POD 1 in good condition.    CBC    Component Value Date/Time   WBC 15.6 (H) 01/28/2016 0353   RBC 3.32 (L) 01/28/2016 0353   HGB 8.9 (L) 01/28/2016 0353   HCT 27.2 (L) 01/28/2016 0353   PLT 77 (L) 01/28/2016 0353   MCV 81.9 01/28/2016 0353   MCH 26.8 01/28/2016 0353   MCHC 32.7 01/28/2016 0353   RDW 18.3 (H) 01/28/2016 0353   LYMPHSABS 1.8 01/24/2016 0535   MONOABS 2.0 (H) 01/24/2016 0535   EOSABS 0.0 01/24/2016 0535   BASOSABS 0.0 01/24/2016 0535    BMET    Component Value Date/Time   NA 139 01/28/2016 0353   K 4.8 01/28/2016 0353   CL 98 (L) 01/28/2016 0353   CO2 33 (H) 01/28/2016 0353   GLUCOSE 98 01/28/2016 0353   BUN 19 01/28/2016 0353   CREATININE 1.12 01/28/2016 0353   CREATININE 1.19 (H) 01/12/2016 1055   CALCIUM 8.7 (L) 01/28/2016 0353   GFRNONAA >60 01/28/2016 0353   GFRNONAA 79 06/27/2015 1456   GFRAA >60 01/28/2016 0353   GFRAA >89 06/27/2015 1456     Discharge Instructions:   The patient is discharged to home with extensive instructions on wound care and progressive ambulation.  They are instructed not to drive or perform any heavy lifting until returning to see the physician in his office.  Discharge Instructions    Call MD for:  redness, tenderness, or signs of infection (pain, swelling, bleeding, redness, odor or green/yellow discharge around incision site)    Complete by:  As directed    Call MD for:  severe or increased pain, loss or decreased feeling  in affected limb(s)    Complete by:  As directed    Call MD for:  temperature >100.5    Complete by:  As directed    Discharge wound care:    Complete by:  As directed    Remove dressing from left groin tomorrow and shower. Wash over left groin with soap and water and pat dry. You do not have to reapply a dressing.   Apply dry gauze in between right 1st and 2nd toes  daily to keep moisture from building up.   Driving Restrictions    Complete by:  As directed    No driving until seen by Dr. Edilia Bo in the office.   Increase activity slowly    Complete by:  As directed    Walk with assistance use walker or cane as needed   Lifting restrictions    Complete by:  As directed    No lifting for 2 weeks   Resume previous diet    Complete by:  As directed       Discharge Diagnosis:  Pain of right lower extremity [M79.604]  Secondary Diagnosis: Patient Active Problem List   Diagnosis Date Noted  . Atheroembolic kidney disease (HCC)   . Pre-operative clearance   . Thrombocytopenia (HCC)   . PVD (peripheral vascular disease) (HCC) 01/22/2016  . Acute heart failure (HCC) 02/07/2015  . Closed fracture of left proximal humerus 01/29/2015  . PAD (peripheral  artery disease) (HCC) 01/05/2015  . Discoloration of skin of foot 12/26/2014  . Swelling of lower extremity 12/26/2014  . Chronic hypoxemic respiratory failure (HCC) 12/16/2014  . IPF (idiopathic pulmonary fibrosis) (HCC) 12/16/2014  . BPH (benign prostatic hyperplasia) 10/02/2014  . AAA (abdominal aortic aneurysm) without rupture (HCC) 09/11/2013  . Physical deconditioning 08/14/2013  . Bladder mass 08/01/2013  . Abdominal aortic aneurysm (HCC) 08/01/2013  . Orthostatic hypotension 07/17/2013  . Anemia 07/17/2013  . Interstitial pulmonary fibrosis (HCC) 07/17/2013  . GERD (gastroesophageal reflux disease) 07/20/2012  . BOOP (bronchiolitis obliterans with organizing pneumonia) (HCC) 06/15/2012  . Wrist pain 06/09/2012  . Allergic conjunctivitis 12/14/2011  . Hearing loss of both ears 01/25/2011  . 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 Persistent atrial fibrillation (HCC) 11/05/2009  . S/P AVR (aortic valve replacement) 11/05/2009  . OTHER ABNORMAL GLUCOSE 01/14/2009  . Hyperlipidemia 08/08/2007  . Aortic valve disorder 08/07/2007   Past Medical History:  Diagnosis Date  . AAA  (abdominal aortic aneurysm) (HCC)   . Aortic stenosis    AVR 2011  . Arthritis    "hands" (01/23/2016)  . Atrial fibrillation (HCC)    "started in 09/2015" (01/23/2016)  . Cleft palate   . Dermatitis    poison ivy  . GERD (gastroesophageal reflux disease)   . Heart murmur    "before AVR"  . History of hiatal hernia   . Hyperlipidemia   . Interstitial lung disease (HCC)   . Iron (Fe) deficiency anemia    "had an infusion 07/2015" (01/23/2016)  . On home oxygen therapy    "6L; 24/7" (01/23/2016)  . Pulmonary fibrosis (HCC)   . PVD (peripheral vascular disease) (HCC) 01/22/2016  . Syncope and collapse 07/18/2014     Allergies as of 01/28/2016      Reactions   Pravastatin Sodium Other (See Comments)   Sneezing episodes, can take medication and tolerates reaction.   Pirfenidone Nausea And Vomiting      Medication List    TAKE these medications   acetaminophen 325 MG tablet Commonly known as:  TYLENOL Take 2 tablets (650 mg total) by mouth every 4 (four) hours as needed for headache or mild pain.   AMBULATORY NON FORMULARY MEDICATION Rollator rolling walker with seat.  Diagnosis Physical Deconditioning 799.3   apixaban 5 MG Tabs tablet Commonly known as:  ELIQUIS Take 1 tablet (5 mg total) by mouth 2 (two) times daily.   aspirin 81 MG EC tablet Take 1 tablet (81 mg total) by mouth daily.   cephALEXin 500 MG capsule Commonly known as:  KEFLEX Take 1 capsule (500 mg total) by mouth 3 (three) times daily.   diphenhydramine-acetaminophen 25-500 MG Tabs tablet Commonly known as:  TYLENOL PM Take 1 tablet by mouth at bedtime as needed (for sleep).   docusate sodium 100 MG capsule Commonly known as:  COLACE Take 100 mg by mouth daily as needed for mild constipation.   finasteride 5 MG tablet Commonly known as:  PROSCAR Take 5 mg by mouth once a day What changed:  See the new instructions.   furosemide 20 MG tablet Commonly known as:  LASIX Take 0.5-1 tablets (10-20  mg total) by mouth See admin instructions. 10 mg every other day but increase to 20 mg if edema is evident What changed:  how much to take  when to take this  additional instructions   ketoconazole 2 % cream Commonly known as:  NIZORAL Apply 1 application topically 2 (two) times daily. TO  AFFECTED AREAS OF FEET What changed:  additional instructions   loratadine 10 MG tablet Commonly known as:  CLARITIN Take 10 mg by mouth every morning.   metoprolol tartrate 25 MG tablet Commonly known as:  LOPRESSOR Take 1 tablet (25 mg total) by mouth 2 (two) times daily.   midodrine 2.5 MG tablet Commonly known as:  PROAMATINE Take 1 tablet (2.5 mg total) by mouth 2 (two) times daily with a meal. ONLY TO BE TAKEN IF SYSTOLIC PRESSURE IS <120 What changed:  additional instructions   multivitamin tablet Take 1 tablet by mouth daily.   NON FORMULARY Place 6 L into the nose continuous.   pantoprazole 40 MG tablet Commonly known as:  PROTONIX Take 40 mg by mouth two times a day before meals What changed:  See the new instructions.   pravastatin 40 MG tablet Commonly known as:  PRAVACHOL Take 1 tablet (40 mg total) by mouth at bedtime. Take 1 tablet by mouth at  bedtim What changed:  how much to take  how to take this  when to take this  additional instructions   predniSONE 10 MG tablet Commonly known as:  DELTASONE Take 1 tablet (10 mg total) by mouth daily with breakfast.       Disposition: Home  Patient's condition: is Good  Follow up: 1. Dr. Edilia Bo in 2-3 weeks   Maris Berger, PA-C [facility] [phone] 01/28/2016  1:08 PM

## 2016-01-28 NOTE — Telephone Encounter (Signed)
LVM for appt on 1/19 mailed letter

## 2016-01-28 NOTE — Care Management Important Message (Signed)
Important Message  Patient Details  Name: [name] MRN: [id number] Date of Birth: [birth year]   Medicare Important Message Given:  Yes    Kyla BalzarineShealy, Johnjoseph Rolfe Abena 01/28/2016, 11:46 AM

## 2016-01-28 NOTE — Telephone Encounter (Signed)
-----   Message from Sharee PimpleMarilyn K McChesney, RN sent at 01/28/2016  9:08 AM EST ----- Regarding: 2-3 weeks   ----- Message ----- From: Raymond GurneyKimberly A Trinh, PA-C Sent: 01/28/2016   7:49 AM To: [facility] Charge Pool  S/p PTA and stenting right SFA 01/27/16  F/u with Dr. Edilia Boickson in 2-3 weeks to evaluate right foot wound.   Thanks Selena BattenKim

## 2016-01-28 NOTE — Care Management Note (Signed)
Case Management Note Donn PieriniKristi Loise Esguerra RN, BSN Unit 2W-Case Manager [phone]  Patient Details  Name: Roy BundeRobert G Deshmukh MRN: [id number] Date of Birth: September 12, 1934  Subjective/Objective:   Pt admitted with PVD, s/p -PTA/ Stent right SFA                  Action/Plan: PTA pt lived at home- has home 6502 with [facility] -baseline 6L- no CM needs noted for discharge- plan to resume Eliquis that pt was already on at home for discharge.   Expected Discharge Date:    01/28/16              Expected Discharge Plan:  Home/Self Care  In-House Referral:     Discharge planning Services  CM Consult  Post Acute Care Choice:  NA Choice offered to:  NA  DME Arranged:    DME Agency:     HH Arranged:    HH Agency:     Status of Service:  Completed, signed off  If discussed at [employer] Length of Stay Meetings, dates discussed:    Additional Comments:  Darrold SpanWebster, Almee Pelphrey Hall, RN 01/28/2016, 10:25 AM

## 2016-01-28 NOTE — Progress Notes (Signed)
ANTICOAGULATION CONSULT NOTE  Pharmacy Consult for Heparin > Apixaban Indication: atrial fibrillation  Allergies  Allergen Reactions  . Pravastatin Sodium Other (See Comments)    Sneezing episodes, can take medication and tolerates reaction.  . Pirfenidone Nausea And Vomiting    Patient Measurements: Height: 6\' 1"  (185.4 cm) Weight: 194 lb 9.6 oz (88.3 kg) IBW/kg (Calculated) : 79.9 Heparin Dosing Weight: 88kg  Vital Signs: Temp: 98.2 F (36.8 C) (01/03 0508) Temp Source: Oral (01/03 0508) BP: 111/61 (01/03 0508)  Labs:  Recent Labs  01/25/16 0935 01/26/16 0230 01/27/16 0436 01/28/16 0353  HGB 9.3* 9.0* 9.3* 8.9*  HCT 29.3* 28.0* 28.7* 27.2*  PLT 73* 76* 92* 77*  APTT 83* 86* 77* 54*  LABPROT  --   --  14.4  --   INR  --   --  1.12  --   HEPARINUNFRC 0.79* 0.89* 0.69  --   CREATININE 1.21 1.16 1.20 1.12    Estimated Creatinine Clearance: 60.4 mL/min (by C-G formula based on SCr of 1.12 mg/dL).   Infusions:  . heparin 1,350 Units/hr (01/27/16 1833)   Assessment: Patient is a 82 year old male with atrial fibrillation on apixaban admitted with bilateral ischemic foot changes. His last dose of apixaban was 12/28 at around 1700 and had been on hold for procedures and started on heparin. Patient also has known history of thrombocytopenia.  Now s/p angiogram with stent placed on 1/2. Pharmacy consulted to transition from heparin back to PTA apixaban this AM. Hg low stable, plt down to 77. No bleeding issues documented.   Goal of Therapy:  Stroke prevention Monitor platelets by anticoagulation protocol: Yes   Plan:   D/c heparin at time of 1st dose of apixaban 5mg  PO BID (communicated plan with RN) Monitor CBC, s/sx bleeding   Babs BertinHaley Karanveer Ramakrishnan, PharmD, BCPS Clinical Pharmacist 01/28/2016 9:06 AM

## 2016-01-28 NOTE — Progress Notes (Signed)
   VASCULAR SURGERY ASSESSMENT & PLAN:  1 Day Post-Op s/p: PTA/ Stent right SFA. Added 81 mg ASA, but no Plavix because of Eliquis.   Add Keflex for mild cellulitis of right foot with open wound between right 1st and 2nd toes.  He can do dressing changes himself (dry 2X2 between 1st and 2nd toes)  Home today on Eliquis. D/C heparin.   Thrombocytopenia stable  Renal function stable   SUBJECTIVE: No complaints  PHYSICAL EXAM: Vitals:   01/27/16 1236 01/27/16 1307 01/27/16 2006 01/28/16 0508  BP: 100/60 112/60 112/63 111/61  Pulse: 76 79 89   Resp:   18 18  Temp:   98.7 F (37.1 C) 98.2 F (36.8 C)  TempSrc:   Oral Oral  SpO2:   100% 100%  Weight:      Height:       Left groin site looks fine Brisk right DP and Peroneal with doppler Wound between right 1st and 2nd toes is unchanged. No drainage.    LABS: Lab Results  Component Value Date   WBC 15.6 (H) 01/28/2016   HGB 8.9 (L) 01/28/2016   HCT 27.2 (L) 01/28/2016   MCV 81.9 01/28/2016   PLT 77 (L) 01/28/2016   Lab Results  Component Value Date   CREATININE 1.12 01/28/2016   Lab Results  Component Value Date   INR 1.12 01/27/2016   CBG (last 3)   Recent Labs  01/26/16 1628  GLUCAP 155*    Active Problems:   PVD (peripheral vascular disease) (HCC)   Atheroembolic kidney disease (HCC)   Pre-operative clearance   Thrombocytopenia (HCC)   Cari CarawayChris Derrion Tritz Beeper: [phone]: [phone] 01/28/2016

## 2016-01-29 ENCOUNTER — Telehealth: Payer: Self-pay

## 2016-01-29 NOTE — Telephone Encounter (Signed)
Phone call from pt's wife.  Reported right foot has a red rash the is present on inner and outer aspect of foot,  and outer aspect of the ankle, with swelling of the foot.   Stated it looks like a "burn."  Reported the pt. c/o itching on one side of the foot.  Denied fever/ chills.  Stated there is a variation in the pigmentation of the red tissue.  Reported he started the Keflex yesterday, and took 2 doses, and has had 2 of 3 doses today.  Discussed with K. Greggory Stallionrinh, PA.  Advised to continue to monitor, and give more time for the antibiotic to take effect.  Returned call to pt's wife.  Advised of recommendations of PA.  Also advised to have the pt. To elevate his right leg above level of heart at intervals, to reduce the swelling.  Encouraged to call office if symptoms don't improve.  Verb. Understanding.

## 2016-01-29 NOTE — Progress Notes (Signed)
HPI: FU AVR. Previously diagnosed with severe AS by echo. Cardiac catheterization in Sept 2011 revealed nonobstructive coronary disease, normal LV function and moderately severe aortic stenosis with a mean gradient of 34 mm of mercury. The patient then underwent aortic valve replacement on September 21 of 2011 with a pericardial tissue valve. Echo 1/17 showed normal LV function, normal bioprosthetic valve. Ab ultrasound 8/17showed abdominal aortic aneurysm of 3 x 2.8 cm. Carotid dopplers 8/17 showed 60-79 R and 40-59 L. PT also with ILD and BOOP. Seen recently and found to have new onset atrial fibrillation. TSH normal. Hgb 9.9. Pt has chronic anemia. Holter 9/17 showed afib rate controlled. Patient admitted December 2017 with leg pain and had angioplasty of the right superficial femoral artery and stenting.  Since I last saw him, the patient notices mild increased dyspnea. He has had increasing pedal edema since receiving iron infusion, blood transfusion and IV antibiotics for cellulitis. No chest pain, palpitations or syncope.  Current Outpatient Prescriptions  Medication Sig Dispense Refill  . acetaminophen (TYLENOL) 325 MG tablet Take 2 tablets (650 mg total) by mouth every 4 (four) hours as needed for headache or mild pain.    Marland Kitchen. AMBULATORY NON FORMULARY MEDICATION Rollator rolling walker with seat.  Diagnosis Physical Deconditioning 799.3 1 Units 0  . apixaban (ELIQUIS) 5 MG TABS tablet Take 1 tablet (5 mg total) by mouth 2 (two) times daily. 60 tablet 11  . aspirin EC 81 MG EC tablet Take 1 tablet (81 mg total) by mouth daily.    . cephALEXin (KEFLEX) 500 MG capsule Take 1 capsule (500 mg total) by mouth 3 (three) times daily. 42 capsule 0  . diphenhydramine-acetaminophen (TYLENOL PM) 25-500 MG TABS tablet Take 1 tablet by mouth at bedtime as needed (for sleep).    . finasteride (PROSCAR) 5 MG tablet Take 5 mg by mouth once a day 90 tablet 0  . furosemide (LASIX) 20 MG tablet Take 0.5-1  tablets (10-20 mg total) by mouth See admin instructions. 10 mg every other day but increase to 20 mg if edema is evident    . ketoconazole (NIZORAL) 2 % cream Apply 1 application topically 2 (two) times daily. TO AFFECTED AREAS OF FEET    . loratadine (CLARITIN) 10 MG tablet Take 10 mg by mouth every morning.     . midodrine (PROAMATINE) 2.5 MG tablet Take 1 tablet (2.5 mg total) by mouth 2 (two) times daily with a meal. ONLY TO BE TAKEN IF SYSTOLIC PRESSURE IS <120    . Multiple Vitamin (MULTIVITAMIN) tablet Take 1 tablet by mouth daily.    . NON FORMULARY Place 6 L into the nose continuous.     . pantoprazole (PROTONIX) 40 MG tablet Take 40 mg by mouth two times a day before meals 180 tablet 0  . pravastatin (PRAVACHOL) 40 MG tablet Take 1 tablet (40 mg total) by mouth at bedtime. Take 1 tablet by mouth at  bedtim    . predniSONE (DELTASONE) 10 MG tablet Take 1 tablet (10 mg total) by mouth daily with breakfast.    . docusate sodium (COLACE) 100 MG capsule Take 100 mg by mouth daily as needed for mild constipation.     No current facility-administered medications for this visit.    Facility-Administered Medications Ordered in Other Visits  Medication Dose Route Frequency Provider Last Rate Last Dose  . iron dextran complex (INFED) 25 mg in sodium chloride 0.9 % 500 mL IVPB  25 mg  Intravenous Once [employer], DO       Followed by  . iron dextran complex (INFED) 1,000 mg in sodium chloride 0.9 % 500 mL IVPB  1,000 mg Intravenous Once Laren Boom, DO         Past Medical History:  Diagnosis Date  . AAA (abdominal aortic aneurysm) (HCC)   . Aortic stenosis    AVR 2011  . Arthritis    "hands" (01/23/2016)  . Atrial fibrillation (HCC)    "started in 09/2015" (01/23/2016)  . Cleft palate   . Dermatitis    poison ivy  . 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 GERD (gastroesophageal reflux disease)   . Heart murmur    "before AVR"  . History of hiatal hernia   . Hyperlipidemia     . Interstitial lung disease (HCC)   . Iron (Fe) deficiency anemia    "had an infusion 07/2015" (01/23/2016)  . Iron deficiency anemia 02/02/2016  . 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 On home oxygen therapy    "6L; 24/7" (01/23/2016)  . Pulmonary fibrosis (HCC)   . PVD (peripheral vascular disease) (HCC) 01/22/2016  . Syncope and collapse 07/18/2014    Past Surgical History:  Procedure Laterality Date  . CARDIAC CATHETERIZATION  09/2009  . CARDIAC VALVE REPLACEMENT    . CATARACT EXTRACTION W/ INTRAOCULAR LENS  IMPLANT, BILATERAL Bilateral 2004  . CLEFT PALATE REPAIR  1938 - ? X 7  . INGUINAL HERNIA REPAIR Right 1950's  . LAPAROSCOPIC CHOLECYSTECTOMY  1999  . oral surgery with bone graft from hip for cleft palate  1989  . PERIPHERAL VASCULAR CATHETERIZATION N/A 01/27/2016   Procedure: Abdominal Aortogram w/ Bilateral Lower Extremity Runoff;  Surgeon: Chuck Hint, MD;  Location: [facility] INVASIVE CV LAB;  Service: Cardiovascular;  Laterality: N/A;  . PERIPHERAL VASCULAR CATHETERIZATION  01/27/2016   Procedure: Peripheral Vascular Intervention-Right SFA 7x80 [employer]ova;  Surgeon: Chuck Hint, MD;  Location[facility] INVAS[facility] CV LAB;  Service: Cardiovascular;;  . ROTATOR CUFF REPAIR Left 08/2007  . TISSUE AORTIC VALVE REPLACEMENT  10/15/2009   w/ pericardial tissue valve; Dr Barry Dienes    Social History   Social History  . Marital status: Married    Spouse name: N/A  . Number of children: N/A  . Years of education: N/A   Occupational History  . Retired Retired   Social History Main Topics  . Smoking status: Former Smoker    Packs/day: 1.50    Years: 5.00    Types: Cigarettes, Cigars    Quit date: 09/08/1977  . Smokeless tobacco: Never Used  . Alcohol use No  . Drug use: No  . Sexual activity: No   Other Topics Concern  . Not on file   Social History Narrative  . No narrative on file    Family History  Problem Relation Age of Onset  . Heart attack Mother   . Cancer  Father     lung and stomach  . Heart attack Father   . Cancer Sister     bone  . Diabetes Sister   . Heart murmur Brother   . Hypertension Brother   . Hyperlipidemia Brother   . Heart attack Brother     ROS: no fevers or chills, productive cough, hemoptysis, dysphasia, odynophagia, melena, hematochezia, dysuria, hematuria, rash, seizure activity, orthopnea, PND, pedal edema, claudication. Remaining systems are negative.  Physical Exam: Well-developed well-nourished in no acute distress.  Skin is warm and dry.  HEENT is normal.  Neck  is supple.  Chest is clear to auscultation with normal expansion.  Cardiovascular exam is irregular Abdominal exam nontender or distended. No masses palpated. Extremities show no edema. neuro grossly intact  A/P  1 atrial fibrillation-patient remains in atrial fibrillation on exam. Continue apixaban. Apparently his heart rate is elevated at times. I will arrange a 24-hour Holter monitor. If elevated we will likely add low-dose beta blockade.  2 hyperlipidemia-continue statin.  3 abdominal aortic aneurysm-he will need follow-up ultrasound Dec 2018.  4 dyspnea-probable majority related to interstitial lung disease. Follow-up pulmonary for interstitial lung disease. He is mildly volume overloaded following recent iron infusion, transfusion and IV antibiotics. Take 40 mg of Lasix daily for 3 days and then resume 20 mg every other day.  5 history of aortic valve replacement-continue SBE prophylaxis.  6 carotid artery disease-continue statin. Follow-up carotid Dopplers August 2018.  7 peripheral vascular disease-status post PTA. Continue asa and statin.  Olga Millers, MD

## 2016-01-30 ENCOUNTER — Other Ambulatory Visit: Payer: Self-pay | Admitting: Family

## 2016-01-30 ENCOUNTER — Other Ambulatory Visit (HOSPITAL_BASED_OUTPATIENT_CLINIC_OR_DEPARTMENT_OTHER): Payer: Medicare Other

## 2016-01-30 ENCOUNTER — Ambulatory Visit (HOSPITAL_BASED_OUTPATIENT_CLINIC_OR_DEPARTMENT_OTHER): Payer: Medicare Other | Admitting: Family

## 2016-01-30 ENCOUNTER — Ambulatory Visit (HOSPITAL_COMMUNITY)
Admission: RE | Admit: 2016-01-30 | Discharge: 2016-01-30 | Disposition: A | Discharge status: Home / Self Care | Payer: Medicare Other | Source: Ambulatory Visit | Attending: Hematology & Oncology | Admitting: Hematology & Oncology

## 2016-01-30 ENCOUNTER — Ambulatory Visit (HOSPITAL_BASED_OUTPATIENT_CLINIC_OR_DEPARTMENT_OTHER): Payer: Medicare Other

## 2016-01-30 ENCOUNTER — Ambulatory Visit: Payer: Medicare Other

## 2016-01-30 VITALS — BP 107/69 | HR 98 | Temp 97.8°F | Resp 20 | Wt 202.0 lb

## 2016-01-30 VITALS — BP 110/54 | HR 87 | Resp 19

## 2016-01-30 DIAGNOSIS — D631 Anemia in chronic kidney disease: Secondary | ICD-10-CM

## 2016-01-30 DIAGNOSIS — Z87891 Personal history of nicotine dependence: Secondary | ICD-10-CM

## 2016-01-30 DIAGNOSIS — D500 Iron deficiency anemia secondary to blood loss (chronic): Secondary | ICD-10-CM

## 2016-01-30 DIAGNOSIS — I4891 Unspecified atrial fibrillation: Secondary | ICD-10-CM

## 2016-01-30 DIAGNOSIS — J8410 Pulmonary fibrosis, unspecified: Secondary | ICD-10-CM

## 2016-01-30 DIAGNOSIS — Z803 Family history of malignant neoplasm of breast: Secondary | ICD-10-CM | POA: Diagnosis not present

## 2016-01-30 DIAGNOSIS — D649 Anemia, unspecified: Secondary | ICD-10-CM

## 2016-01-30 DIAGNOSIS — Z800 Family history of malignant neoplasm of digestive organs: Secondary | ICD-10-CM

## 2016-01-30 DIAGNOSIS — D509 Iron deficiency anemia, unspecified: Secondary | ICD-10-CM

## 2016-01-30 DIAGNOSIS — N189 Chronic kidney disease, unspecified: Secondary | ICD-10-CM

## 2016-01-30 DIAGNOSIS — L03115 Cellulitis of right lower limb: Secondary | ICD-10-CM

## 2016-01-30 DIAGNOSIS — K909 Intestinal malabsorption, unspecified: Secondary | ICD-10-CM

## 2016-01-30 LAB — COMPREHENSIVE METABOLIC PANEL
ALBUMIN: 3.5 g/dL (ref 3.5–5.0)
ALK PHOS: 70 U/L (ref 40–150)
ALT: 11 U/L (ref 0–55)
AST: 9 U/L (ref 5–34)
Anion Gap: 10 mEq/L (ref 3–11)
BUN: 23.9 mg/dL (ref 7.0–26.0)
CALCIUM: 9.4 mg/dL (ref 8.4–10.4)
CO2: 29 mEq/L (ref 22–29)
CREATININE: 1 mg/dL (ref 0.7–1.3)
Chloride: 102 mEq/L (ref 98–109)
EGFR: 74 mL/min/{1.73_m2} — ABNORMAL LOW (ref >90)
GLUCOSE: 119 mg/dL (ref 70–140)
Potassium: 4.1 mEq/L (ref 3.5–5.1)
SODIUM: 141 meq/L (ref 136–145)
Total Bilirubin: 0.74 mg/dL (ref 0.20–1.20)
Total Protein: 6.7 g/dL (ref 6.4–8.3)

## 2016-01-30 LAB — CHCC SATELLITE - SMEAR

## 2016-01-30 LAB — CBC WITH DIFFERENTIAL (CANCER CENTER ONLY)
HEMATOCRIT: 26.4 % — AB (ref 38.7–49.9)
HEMOGLOBIN: 8.5 g/dL — AB (ref 13.0–17.1)
MCH: 27.3 pg — ABNORMAL LOW (ref 28.0–33.4)
MCHC: 32.2 g/dL (ref 32.0–35.9)
MCV: 85 fL (ref 82–98)
Platelets: 119 10*3/uL — ABNORMAL LOW (ref 145–400)
RBC: 3.11 10*6/uL — AB (ref 4.20–5.70)
RDW: 18.2 % — ABNORMAL HIGH (ref 11.1–15.7)
WBC: 19.3 10*3/uL — ABNORMAL HIGH (ref 4.0–10.0)

## 2016-01-30 LAB — MANUAL DIFFERENTIAL (CHCC SATELLITE)
ALC: 0.2 10*3/uL — AB (ref 0.9–3.3)
ANC (CHCC MAN DIFF): 18.2 10*3/uL — AB (ref 1.5–6.5)
LYMPH: 1 % — ABNORMAL LOW (ref 14–48)
MONO: 5 % (ref 0–13)
PLT EST ~~LOC~~: DECREASED
SEG: 94 % — AB (ref 40–75)

## 2016-01-30 LAB — IRON AND TIBC
%SAT: 13 % — ABNORMAL LOW (ref 20–55)
Iron: 26 ug/dL — ABNORMAL LOW (ref 42–163)
TIBC: 196 ug/dL — AB (ref 202–409)
UIBC: 170 ug/dL (ref 117–376)

## 2016-01-30 LAB — ABO/RH: ABO/RH(D): O POS

## 2016-01-30 LAB — FERRITIN: Ferritin: 840 ng/ml — ABNORMAL HIGH (ref 22–316)

## 2016-01-30 LAB — LACTATE DEHYDROGENASE: LDH: 241 U/L (ref 125–245)

## 2016-01-30 MED ORDER — SODIUM CHLORIDE 0.9 % IV SOLN
510.0000 mg | Freq: Once | INTRAVENOUS | Status: AC
  Administered 2016-01-30: 510 mg via INTRAVENOUS
  Filled 2016-01-30: qty 17

## 2016-01-30 MED ORDER — SODIUM CHLORIDE 0.9 % IV SOLN
Freq: Once | INTRAVENOUS | Status: AC
  Administered 2016-01-30: 13:00:00 via INTRAVENOUS

## 2016-01-30 MED ORDER — SODIUM CHLORIDE 0.9 % IV SOLN
4.0400 mg/kg | Freq: Once | INTRAVENOUS | Status: AC
  Administered 2016-01-30: 370 mg via INTRAVENOUS
  Filled 2016-01-30: qty 7.4

## 2016-01-30 NOTE — Progress Notes (Signed)
Hematology/Oncology Consultation   Name: [name]      MRN: [id number]    Location: Room/bed info not found  Date: 01/30/2016 Time:10:25 AM   REFERRING PHYSICIAN: Hali Marry, MD  REASON FOR CONSULT: Iron deficiency anemia   DIAGNOSIS: Iron deficiency anemia  HISTORY OF PRESENT ILLNESS: [name] is a very pleasant 82 yo white male with a year long history of iron deficiency anemia having failed oral iron therapy. He is symptomatic at this time with fatigue and worsening of SOB.  No other family history of anemia that he is aware of.  He received INFED in June for his anemia. Iron in December was low at 24, ferritin 495. Hgb today is 8.5. Iron studies are pending.  He takes Eliquis BID for atrial fib. He denies having any episodes of bleeding or petechiae.  He had a 95% arterial blockage and underwent an arteriography with stenting of the right superficial femoral artery earlier this week. He tolerated the procedure well and states that he has some mild bruising of the pelvis that is resolving but no bleeding or evidence of hematoma.  He has an impressive cellulitis of the right foot. He has been on Keflex TID for several days now and the foot is still quite red and tender. He has +2 pitting edema in the right lower extremity, pedal pulses are +1. He would likely benefit from a dose of IV antibiotics today.  No fever, chills, n/v, cough, dizziness, chest pain, palpitations, abdominal pain or changes in bowel or bladder habits.  He has chronic SOB with pulmonary fibrosis and is on 6L supplemental O2 via nasal cannula 24 hours a day.  No lymphadenopathy found on exam.  He has occasional tingling in his feet that comes and goes.  No personal cancer history. Family cancer history includes father with colon and sister with breast.  He was a smoker but quit in the 1970's. He does not drink ETOH.  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   ROS: All other 10 point review of  systems is negative.   PAST MEDICAL HISTORY:   Past Medical History:  Diagnosis Date  . AAA (abdominal aortic aneurysm) ([place])   . Aortic stenosis    AVR 2011  . Arthritis    "hands" (01/23/2016)  . Atrial fibrillation ([place])    "started in 09/2015" (01/23/2016)  . Cleft palate   . Dermatitis    poison ivy  . GERD (gastroesophageal reflux disease)   . Heart murmur    "before AVR"  . History of hiatal hernia   . Hyperlipidemia   . Interstitial lung disease ([place])   . Iron (Fe) deficiency anemia    "had an infusion 07/2015" (01/23/2016)  . On home oxygen therapy    "6L; 24/7" (01/23/2016)  . Pulmonary fibrosis ([place])   . PVD (peripheral vascular disease) ([place]) 01/22/2016  . Syncope and collapse 07/18/2014    ALLERGIES: Allergies  Allergen Reactions  . Pravastatin Sodium Other (See Comments)    Sneezing episodes, can take medication and tolerates reaction.  . Pirfenidone Nausea And Vomiting      MEDICATIONS:  Current Outpatient Prescriptions on File Prior to Visit  Medication Sig Dispense Refill  . acetaminophen (TYLENOL) 325 MG tablet Take 2 tablets (650 mg total) by mouth every 4 (four) hours as needed for headache or mild pain.    Marland Kitchen AMBULATORY NON FORMULARY MEDICATION Rollator rolling walker with seat.  Diagnosis Physical Deconditioning 799.3 1 Units 0  .  apixaban (ELIQUIS) 5 MG TABS tablet Take 1 tablet (5 mg total) by mouth 2 (two) times daily. 60 tablet 11  . aspirin EC 81 MG EC tablet Take 1 tablet (81 mg total) by mouth daily.    . cephALEXin (KEFLEX) 500 MG capsule Take 1 capsule (500 mg total) by mouth 3 (three) times daily. 42 capsule 0  . diphenhydramine-acetaminophen (TYLENOL PM) 25-500 MG TABS tablet Take 1 tablet by mouth at bedtime as needed (for sleep).    . docusate sodium (COLACE) 100 MG capsule Take 100 mg by mouth daily as needed for mild constipation.    . finasteride (PROSCAR) 5 MG tablet Take 5 mg by mouth once a day 90 tablet 0  . furosemide (LASIX) 20  MG tablet Take 0.5-1 tablets (10-20 mg total) by mouth See admin instructions. 10 mg every other day but increase to 20 mg if edema is evident    . ketoconazole (NIZORAL) 2 % cream Apply 1 application topically 2 (two) times daily. TO AFFECTED AREAS OF FEET    . loratadine (CLARITIN) 10 MG tablet Take 10 mg by mouth every morning.     . metoprolol tartrate (LOPRESSOR) 25 MG tablet Take 1 tablet (25 mg total) by mouth 2 (two) times daily. 60 tablet 3  . midodrine (PROAMATINE) 2.5 MG tablet Take 1 tablet (2.5 mg total) by mouth 2 (two) times daily with a meal. ONLY TO BE TAKEN IF SYSTOLIC PRESSURE IS <193    . Multiple Vitamin (MULTIVITAMIN) tablet Take 1 tablet by mouth daily.    . NON FORMULARY Place 6 L into the nose continuous.     . pantoprazole (PROTONIX) 40 MG tablet Take 40 mg by mouth two times a day before meals 180 tablet 0  . pravastatin (PRAVACHOL) 40 MG tablet Take 1 tablet (40 mg total) by mouth at bedtime. Take 1 tablet by mouth at  bedtim    . predniSONE (DELTASONE) 10 MG tablet Take 1 tablet (10 mg total) by mouth daily with breakfast.    . [DISCONTINUED] tadalafil (CIALIS) 20 MG tablet Take 20 mg by mouth daily as needed. Take one about 1-2 hours before sexual intercourse      Current Facility-Administered Medications on File Prior to Visit  Medication Dose Route Frequency Provider Last Rate Last Dose  . iron dextran complex (INFED) 25 mg in sodium chloride 0.9 % 500 mL IVPB  25 mg Intravenous Once Sean Hommel, DO       Followed by  . iron dextran complex (INFED) 1,000 mg in sodium chloride 0.9 % 500 mL IVPB  1,000 mg Intravenous Once Marcial Pacas, DO         PAST SURGICAL HISTORY Past Surgical History:  Procedure Laterality Date  . CARDIAC CATHETERIZATION  09/2009  . CARDIAC VALVE REPLACEMENT    . CATARACT EXTRACTION W/ INTRAOCULAR LENS  IMPLANT, BILATERAL Bilateral 2004  . CLEFT PALATE REPAIR  1938 - ? X 7  . INGUINAL HERNIA REPAIR Right 1950's  . LAPAROSCOPIC  CHOLECYSTECTOMY  1999  . oral surgery with bone graft from hip for cleft palate  1989  . PERIPHERAL VASCULAR CATHETERIZATION N/A 01/27/2016   Procedure: Abdominal Aortogram w/ Bilateral Lower Extremity Runoff;  Surgeon: Angelia Mould, MD;  Location: [place] CV LAB;  Service: Cardiovascular;  Laterality: N/A;  . PERIPHERAL VASCULAR CATHETERIZATION  01/27/2016   Procedure: Peripheral Vascular Intervention-Right SFA 7x80 [facility];  Surgeon: Angelia Mould, MD;  Location: [place] CV LAB;  Service: Cardiovascular;;  .  ROTATOR CUFF REPAIR Left 08/2007  . TISSUE AORTIC VALVE REPLACEMENT  10/15/2009   w/ pericardial tissue valve; Dr Ricard Dillon    FAMILY HISTORY: Family History  Problem Relation Age of Onset  . Heart attack Mother   . Cancer Father     lung and stomach  . Heart attack Father   . Cancer Sister     bone  . Diabetes Sister   . Heart murmur Brother   . Hypertension Brother   . Hyperlipidemia Brother   . Heart attack Brother     SOCIAL HISTORY:  reports that he quit smoking about 38 years ago. His smoking use included Cigarettes and Cigars. He has a 7.50 pack-year smoking history. He has never used smokeless tobacco. He reports that he does not drink alcohol or use drugs.  PERFORMANCE STATUS: The patient's performance status is 2 - Symptomatic, <50% confined to bed  PHYSICAL EXAM: Most Recent Vital Signs: There were no vitals taken for this visit. There were no vitals taken for this visit.  General Appearance:    Alert, cooperative, no distress, appears stated age  Head:    Normocephalic, without obvious abnormality, atraumatic  Eyes:    PERRL, conjunctiva/corneas clear, EOM's intact, fundi    benign, both eyes             Throat:   Lips, mucosa, and tongue normal; teeth and gums normal  Neck:   Supple, symmetrical, trachea midline, no adenopathy;       thyroid:  No enlargement/tenderness/nodules; no carotid   bruit or JVD  Back:     Symmetric, no  curvature, ROM normal, no CVA tenderness  Lungs:     Course to auscultation bilaterally, respirations unlabored, 6L supplemental O2 via nasal cannula  Chest wall:    No tenderness or deformity  Heart:    Regular rate and rhythm, S1 and S2 normal, no murmur, rub   or gallop  Abdomen:     Soft, non-tender, bowel sounds active all four quadrants,    no masses, no organomegaly        Extremities:   Extremities normal, atraumatic, no cyanosis or edema  Pulses:   1+ and symmetric all extremities  Skin:   Skin color, texture, turgor normal, no lesions, cellulitis of the right foot  Lymph nodes:   Cervical, supraclavicular, and axillary nodes normal  Neurologic:   CNII-XII intact. Normal strength, sensation and reflexes      throughout    LABORATORY DATA: No results found for this or any previous visit (from the past 48 hour(s)).    RADIOGRAPHY: No results found.     PATHOLOGY: None  ASSESSMENT/PLAN: Mr. Treanor is a very pleasant 82 yo white male with a year long history of iron deficiency anemia having failed oral iron therapy. He is symptomatic at this time with fatigue and worsening of SOB. Hgb today is 8.5. Iron 2 weeks ago was 25 and has not been corrected.  We will give him IV Cubicin today for treatment of the cellulitis of the right foot/ankle.  He will get IV iron today as well. We have sent a type and cross and will transfuse 1 unit of blood on Monday.  We will plan to see him back in 3 weeks for repeat lab work and follow-up.   All questions were answered. Both he and his wife know to contact our office with any problems, questions or concerns. We can certainly see him much sooner if necessary.  He was  discussed with and also seen by Dr. Marin Olp and he is in agreement with the aforementioned.   [facility] M     Addendum:  I saw and examined the patient with Sarah. I actually had seen Mr. Goetze in the hospital for a different reason a week or so ago.  He does have  iron deficiency. His iron saturation is only 13%. His serum iron is 26. His ferritin is elevated because of it being an acute phase reactant.  Some of the anemia also is from his erythropoietin level being on the low side. His erythropoietin level is only 55, which is located for his degree of anemia.  I do worry about this cellulitis. I just do not want to see him and up back in the hospital.  I think a dose of IV Cubicin would be reasonable. This should get good penetration into the subcutaneous tissue.  Given his hemoglobin of 8.5, I really think that a transfusion would help. I think this would get his blood count up a lot more quickly and this would help perfuse the right foot stress that the cellulitis can heal up more readily.  I did look at his blood under the microscope. I do not see anything that looked suspicious. I do not see any nucleated red blood cells. I do not see any teardrop cells. No immature myeloid cells. Platelets were slightly decreased in number but well granulated.  Given his age, there is always a concern about myelodysplasia. However, only a bone marrow test would really shows this. I do not believe that a bone marrow biopsy is indicated.  I think he probably would benefit from a dose of IV iron.  Given the fact that he is on blood thinner, he probably has some element of low-grade GI blood loss. He also may have some malabsorption.  We spent about 45 minutes with he and his wife. They are very nice. He does have a good attitude.  We probably will get him back in about 3 weeks.  This was a shared visit.  Lattie Haw, MD

## 2016-01-30 NOTE — Patient Instructions (Signed)
Ferumoxytol injection What is this medicine? FERUMOXYTOL is an iron complex. Iron is used to make healthy red blood cells, which carry oxygen and nutrients throughout the body. This medicine is used to treat iron deficiency anemia in people with chronic kidney disease. COMMON BRAND NAME(S): Feraheme What should I tell my health care provider before I take this medicine? They need to know if you have any of these conditions: -anemia not caused by low iron levels -high levels of iron in the blood -magnetic resonance imaging (MRI) test scheduled -an unusual or allergic reaction to iron, other medicines, foods, dyes, or preservatives -pregnant or trying to get pregnant -breast-feeding How should I use this medicine? This medicine is for injection into a vein. It is given by a health care professional in a hospital or clinic setting. Talk to your pediatrician regarding the use of this medicine in children. Special care may be needed. What if I miss a dose? It is important not to miss your dose. Call your doctor or health care professional if you are unable to keep an appointment. What may interact with this medicine? This medicine may interact with the following medications: -other iron products What should I watch for while using this medicine? Visit your doctor or healthcare professional regularly. Tell your doctor or healthcare professional if your symptoms do not start to get better or if they get worse. You may need blood work done while you are taking this medicine. You may need to follow a special diet. Talk to your doctor. Foods that contain iron include: whole grains/cereals, dried fruits, beans, or peas, leafy green vegetables, and organ meats (liver, kidney). What side effects may I notice from receiving this medicine? Side effects that you should report to your doctor or health care professional as soon as possible: -allergic reactions like skin rash, itching or hives, swelling of the  face, lips, or tongue -breathing problems -changes in blood pressure -feeling faint or lightheaded, falls -fever or chills -flushing, sweating, or hot feelings -swelling of the ankles or feet Side effects that usually do not require medical attention (report to your doctor or health care professional if they continue or are bothersome): -diarrhea -headache -nausea, vomiting -stomach pain Where should I keep my medicine? This drug is given in a hospital or clinic and will not be stored at home.  2017 Elsevier/Gold Standard (2015-02-13 12:41:49)  

## 2016-01-31 LAB — ERYTHROPOIETIN: ERYTHROPOIETIN: 55.3 m[IU]/mL — AB (ref 2.6–18.5)

## 2016-01-31 LAB — RETICULOCYTES: RETICULOCYTE COUNT: 1.4 % (ref 0.6–2.6)

## 2016-02-02 ENCOUNTER — Encounter: Payer: Self-pay | Admitting: Hematology & Oncology

## 2016-02-02 ENCOUNTER — Ambulatory Visit (HOSPITAL_BASED_OUTPATIENT_CLINIC_OR_DEPARTMENT_OTHER): Payer: Medicare Other

## 2016-02-02 ENCOUNTER — Encounter: Payer: Self-pay | Admitting: Family

## 2016-02-02 DIAGNOSIS — D509 Iron deficiency anemia, unspecified: Secondary | ICD-10-CM

## 2016-02-02 DIAGNOSIS — K909 Intestinal malabsorption, unspecified: Secondary | ICD-10-CM

## 2016-02-02 DIAGNOSIS — D649 Anemia, unspecified: Secondary | ICD-10-CM

## 2016-02-02 DIAGNOSIS — D631 Anemia in chronic kidney disease: Secondary | ICD-10-CM

## 2016-02-02 HISTORY — DX: Anemia in chronic kidney disease: D63.1

## 2016-02-02 HISTORY — DX: Intestinal malabsorption, unspecified: K90.9

## 2016-02-02 HISTORY — DX: Iron deficiency anemia, unspecified: D50.9

## 2016-02-02 MED ORDER — DIPHENHYDRAMINE HCL 25 MG PO CAPS
ORAL_CAPSULE | ORAL | Status: AC
  Filled 2016-02-02: qty 1

## 2016-02-02 MED ORDER — ACETAMINOPHEN 325 MG PO TABS
ORAL_TABLET | ORAL | Status: AC
  Filled 2016-02-02: qty 2

## 2016-02-02 MED ORDER — FUROSEMIDE 10 MG/ML IJ SOLN: 20.0000 mg | Freq: Once | INTRAMUSCULAR | Status: DC

## 2016-02-02 MED ORDER — ACETAMINOPHEN 325 MG PO TABS
650.0000 mg | ORAL_TABLET | Freq: Once | ORAL | Status: AC
  Administered 2016-02-02: 650 mg via ORAL

## 2016-02-02 MED ORDER — SODIUM CHLORIDE 0.9 % IV SOLN
250.0000 mL | Freq: Once | INTRAVENOUS | Status: AC
  Administered 2016-02-02: 250 mL via INTRAVENOUS

## 2016-02-02 MED ORDER — DIPHENHYDRAMINE HCL 25 MG PO CAPS
25.0000 mg | ORAL_CAPSULE | Freq: Once | ORAL | Status: AC
  Administered 2016-02-02: 25 mg via ORAL

## 2016-02-02 NOTE — Patient Instructions (Signed)
Blood Transfusion , Adult A blood transfusion is a procedure in which you receive donated blood, including plasma, platelets, and red blood cells, through an IV tube. You may need a blood transfusion because of illness, surgery, or injury. The blood may come from a donor. You may also be able to donate blood for yourself (autologous blood donation) before a surgery if you know that you might require a blood transfusion. The blood given in a transfusion is made up of different types of cells. You may receive:  Red blood cells. These carry oxygen to the cells in the body.  White blood cells. These help you fight infections.  Platelets. These help your blood to clot.  Plasma. This is the liquid part of your blood and it helps with fluid imbalances. If you have hemophilia or another clotting disorder, you may also receive other types of blood products. Tell a health care provider about:  Any allergies you have.  All medicines you are taking, including vitamins, herbs, eye drops, creams, and over-the-counter medicines.  Any problems you or family members have had with anesthetic medicines.  Any blood disorders you have.  Any surgeries you have had.  Any medical conditions you have, including any recent fever or cold symptoms.  Whether you are pregnant or may be pregnant.  Any previous reactions you have had during a blood transfusion. What are the risks? Generally, this is a safe procedure. However, problems may occur, including:  Having an allergic reaction to something in the donated blood. Hives and itching may be symptoms of this type of reaction.  Fever. This may be a reaction to the white blood cells in the transfused blood. Nausea or chest pain may accompany a fever.  Iron overload. This can happen from having many transfusions.  Transfusion-related acute lung injury (TRALI). This is a rare reaction that causes lung damage. The cause is not known.TRALI can occur within hours  of a transfusion or several days later.  Sudden (acute) or delayed hemolytic reactions. This happens if your blood does not match the cells in your transfusion. Your body's defense system (immune system) may try to attack the new cells. This complication is rare. The symptoms include fever, chills, nausea, and low back pain or chest pain.  Infection or disease transmission. This is rare. What happens before the procedure?  You will have a blood test to determine your blood type. This is necessary to know what kind of blood your body will accept and to match it to the donor blood.  If you are going to have a planned surgery, you may be able to do an autologous blood donation. This may be done in case you need to have a transfusion.  If you have had an allergic reaction to a transfusion in the past, you may be given medicine to help prevent a reaction. This medicine may be given to you by mouth or through an IV tube.  You will have your temperature, blood pressure, and pulse monitored before the transfusion.  Follow instructions from your health care provider about eating and drinking restrictions.  Ask your health care provider about:  Changing or stopping your regular medicines. This is especially important if you are taking diabetes medicines or blood thinners.  Taking medicines such as aspirin and ibuprofen. These medicines can thin your blood. Do not take these medicines before your procedure if your health care provider instructs you not to. What happens during the procedure?  An IV tube will be   inserted into one of your veins.  The bag of donated blood will be attached to your IV tube. The blood will then enter through your vein.  Your temperature, blood pressure, and pulse will be monitored regularly during the transfusion. This monitoring is done to detect early signs of a transfusion reaction.  If you have any signs or symptoms of a reaction, your transfusion will be stopped and  you may be given medicine.  When the transfusion is complete, your IV tube will be removed.  Pressure may be applied to the IV site for a few minutes.  A bandage (dressing) will be applied. The procedure may vary among health care providers and hospitals. What happens after the procedure?  Your temperature, blood pressure, heart rate, breathing rate, and blood oxygen level will be monitored often.  Your blood may be tested to see how you are responding to the transfusion.  You may be warmed with fluids or blankets to maintain a normal body temperature. Summary  A blood transfusion is a procedure in which you receive donated blood, including plasma, platelets, and red blood cells, through an IV tube.  Your temperature, blood pressure, and pulse will be monitored before, during, and after the transfusion.  Your blood may be tested after the transfusion to see how your body has responded. This information is not intended to replace advice given to you by your health care provider. Make sure you discuss any questions you have with your health care provider. Document Released: 01/09/2000 Document Revised: 10/09/2015 Document Reviewed: 10/09/2015 Elsevier Interactive Patient Education  2017 [employer]  

## 2016-02-03 LAB — TYPE AND SCREEN
Blood Product Expiration Date: 201801152359
ISSUE DATE / TIME: 201801080847
Unit Type and Rh: 5100

## 2016-02-04 ENCOUNTER — Encounter: Payer: Self-pay | Admitting: Cardiology

## 2016-02-04 ENCOUNTER — Ambulatory Visit (INDEPENDENT_AMBULATORY_CARE_PROVIDER_SITE_OTHER): Payer: Medicare Other | Admitting: Cardiology

## 2016-02-04 VITALS — BP 109/64 | HR 90 | Ht 73.0 in | Wt 201.1 lb

## 2016-02-04 DIAGNOSIS — R972 Elevated prostate specific antigen [PSA]: Secondary | ICD-10-CM | POA: Diagnosis not present

## 2016-02-04 DIAGNOSIS — I359 Nonrheumatic aortic valve disorder, unspecified: Secondary | ICD-10-CM | POA: Diagnosis not present

## 2016-02-04 DIAGNOSIS — I714 Abdominal aortic aneurysm, without rupture, unspecified: Secondary | ICD-10-CM

## 2016-02-04 DIAGNOSIS — I4891 Unspecified atrial fibrillation: Secondary | ICD-10-CM | POA: Diagnosis not present

## 2016-02-04 NOTE — Patient Instructions (Signed)
Medication Instructions:   INCREASE FUROSEMIDE TO 40 MG ONCE DAILY FOR 2 TO 3 DAYS THEN REDUCE BACK TO 10 MG EVERY OTHER DAY= 2 OF THE 20 MG TABLETS ONCE DAILY FOR 2 TO 3 DAY  Testing/Procedures:  Your physician has recommended that you wear a 24 HOUR holter monitor. Holter monitors are medical devices that record the heart's electrical activity. Doctors most often use these monitors to diagnose arrhythmias. Arrhythmias are problems with the speed or rhythm of the heartbeat. The monitor is a small, portable device. You can wear one while you do your normal daily activities. This is usually used to diagnose what is causing palpitations/syncope (passing out).    Follow-Up:  Your physician wants you to follow-up in: 3 MONTHS WITH DR Jens SomRENSHAW You will receive a reminder letter in the mail two months in advance. If you don't receive a letter, please call our office to schedule the follow-up appointment.

## 2016-02-09 DIAGNOSIS — N400 Enlarged prostate without lower urinary tract symptoms: Secondary | ICD-10-CM | POA: Diagnosis not present

## 2016-02-10 ENCOUNTER — Encounter: Payer: Self-pay | Admitting: Vascular Surgery

## 2016-02-13 ENCOUNTER — Encounter: Payer: Self-pay | Admitting: Vascular Surgery

## 2016-02-13 ENCOUNTER — Ambulatory Visit (INDEPENDENT_AMBULATORY_CARE_PROVIDER_SITE_OTHER): Payer: Medicare Other

## 2016-02-13 ENCOUNTER — Ambulatory Visit (INDEPENDENT_AMBULATORY_CARE_PROVIDER_SITE_OTHER): Payer: Medicare Other | Admitting: Vascular Surgery

## 2016-02-13 VITALS — BP 121/77 | HR 91 | Temp 97.5°F | Resp 18 | Ht 73.0 in | Wt 201.0 lb

## 2016-02-13 DIAGNOSIS — I4891 Unspecified atrial fibrillation: Secondary | ICD-10-CM

## 2016-02-13 DIAGNOSIS — Z48812 Encounter for surgical aftercare following surgery on the circulatory system: Secondary | ICD-10-CM | POA: Diagnosis not present

## 2016-02-13 NOTE — Progress Notes (Signed)
Patient name: Roy BundeRobert G Narula MRN: [id number] DOB: 05-09-1934 Sex: male  REASON FOR VISIT: Follow up after angioplasty and stenting of the right superficial femoral artery.  HPI: [name] is a 82 y.o. 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 He underwent angioplasty and stenting with a 7 mm x 80 mm self-expanding stent with an excellent result. He comes in for a follow up visit.  He has completed a course of po antibiotics and he states that the cellulitis in his right foot has resolved. In addition the wound on the right foot has healed.  This patient has been followed in the office in the past by Dr. Tawanna Coolerodd Early with a small abdominal aortic aneurysm and bilateral carotid disease. The aneurysm measured 3.0 cm in August 2017. He had a 60-79% right carotid stenosis with a 40-59% left carotid stenosis in August 2017. He is asymptomatic from a carotid standpoint.  He is on continuous O2.  Current Outpatient Prescriptions  Medication Sig Dispense Refill  . acetaminophen (TYLENOL) 325 MG tablet Take 2 tablets (650 mg total) by mouth every 4 (four) hours as needed for headache or mild pain. (Patient taking differently: Take 500 mg by mouth every 4 (four) hours as needed for headache or mild pain. )    . AMBULATORY NON FORMULARY MEDICATION Rollator rolling walker with seat.  Diagnosis Physical Deconditioning 799.3 1 Units 0  . apixaban (ELIQUIS) 5 MG TABS tablet Take 1 tablet (5 mg total) by mouth 2 (two) times daily. 60 tablet 11  . aspirin EC 81 MG EC tablet Take 1 tablet (81 mg total) by mouth daily.    . cephALEXin (KEFLEX) 500 MG capsule Take 1 capsule (500 mg total) by mouth 3 (three) times daily. 42 capsule 0  . docusate sodium (COLACE) 100 MG capsule Take 100 mg by mouth daily as needed for mild  constipation.    . finasteride (PROSCAR) 5 MG tablet Take 5 mg by mouth once a day 90 tablet 0  . furosemide (LASIX) 20 MG tablet Take 0.5-1 tablets (10-20 mg total) by mouth See admin instructions. 10 mg every other day but increase to 20 mg if edema is evident    . ketoconazole (NIZORAL) 2 % cream Apply 1 application topically 2 (two) times daily. TO AFFECTED AREAS OF FEET    . loratadine (CLARITIN) 10 MG tablet Take 10 mg by mouth every morning.     . midodrine (PROAMATINE) 2.5 MG tablet Take 1 tablet (2.5 mg total) by mouth 2 (two) times daily with a meal. ONLY TO BE TAKEN IF SYSTOLIC PRESSURE IS <120    . Multiple Vitamin (MULTIVITAMIN) tablet Take 1 tablet by mouth daily.    . NON FORMULARY Place 6 L into the nose continuous.     . pantoprazole (PROTONIX) 40 MG tablet Take 40 mg by mouth two times a day before meals 180 tablet 0  . pravastatin (PRAVACHOL) 40 MG tablet Take 1 tablet (40 mg total) by mouth at bedtime. Take 1 tablet by mouth at  bedtim    . predniSONE (DELTASONE) 10 MG tablet Take 1 tablet (10 mg total) by mouth daily with breakfast.    . diphenhydramine-acetaminophen (TYLENOL PM) 25-500 MG TABS tablet Take 1 tablet by mouth at bedtime as needed (for sleep).  No current facility-administered medications for this visit.    Facility-Administered Medications Ordered in Other Visits  Medication Dose Route Frequency Provider Last Rate Last Dose  . iron dextran complex (INFED) 25 mg in sodium chloride 0.9 % 500 mL IVPB  25 mg Intravenous Once Sean Hommel, DO       Followed by  . iron dextran complex (INFED) 1,000 mg in sodium chloride 0.9 % 500 mL IVPB  1,000 mg Intravenous Once [employer], DO        REVIEW OF SYSTEMS:  [X]  denotes positive finding, [ ]  denotes negative finding Cardiac  Comments:  Chest pain or chest pressure:    Shortness of breath upon exertion:    Short of breath when lying flat:    Irregular heart rhythm: X   Constitutional    Fever or chills:       PHYSICAL EXAM: Vitals:   02/13/16 0959 02/13/16 1001  BP: 106/74 121/77  Pulse: 91 91  Resp: 18   Temp: 97.5 F (36.4 C)   SpO2: 98%   Weight: 201 lb (91.2 kg)   Height: 6\' 1"  (1.854 m)     GENERAL: The patient is a well-nourished male, in no acute distress. The vital signs are documented above. CARDIOVASCULAR: There is a regular rate and rhythm. PULMONARY: There is good air exchange bilaterally without wheezing or rales. On the right side, he has a palpable femoral pulse and palpable popliteal pulse. He has a dorsalis pedis, posterior tibial, and peroneal signal with the Doppler. The cellulitis in his right foot has essentially resolved. The wound between the right first and second toes has healed.  MEDICAL ISSUES:  STATUS POST ANGIOPLASTY AND STENTING OF THE RIGHT SUPERFICIAL FEMORAL ARTERY: He has a palpable popliteal pulse and the wound on the foot has healed. He is on Eliquis and therefore we did not put him on Plavix. We did add 81 mg of aspirin however. I will order duplex and ABIs of the right leg in September of this year in order to try to coordinate his multiple vascular studies.  3 CM INFRARENAL ABDOMINAL AORTIC ANEURYSM: I have ordered a duplex of his aorta in September 2019.  60-79% RIGHT CAROTID STENOSIS: He is scheduled for a carotid duplex scan and office visit in March of this year already. He will keep that appointment.  Waverly Ferrari [facility] [phone]

## 2016-02-20 ENCOUNTER — Ambulatory Visit (HOSPITAL_BASED_OUTPATIENT_CLINIC_OR_DEPARTMENT_OTHER): Payer: Medicare Other | Admitting: Family

## 2016-02-20 ENCOUNTER — Telehealth: Payer: Self-pay | Admitting: *Deleted

## 2016-02-20 ENCOUNTER — Other Ambulatory Visit (HOSPITAL_BASED_OUTPATIENT_CLINIC_OR_DEPARTMENT_OTHER): Payer: Medicare Other

## 2016-02-20 DIAGNOSIS — D500 Iron deficiency anemia secondary to blood loss (chronic): Secondary | ICD-10-CM

## 2016-02-20 DIAGNOSIS — K909 Intestinal malabsorption, unspecified: Secondary | ICD-10-CM | POA: Diagnosis not present

## 2016-02-20 DIAGNOSIS — L03115 Cellulitis of right lower limb: Secondary | ICD-10-CM

## 2016-02-20 DIAGNOSIS — D649 Anemia, unspecified: Secondary | ICD-10-CM

## 2016-02-20 LAB — CBC WITH DIFFERENTIAL (CANCER CENTER ONLY)
HCT: 33.4 % — ABNORMAL LOW (ref 38.7–49.9)
HGB: 10.6 g/dL — ABNORMAL LOW (ref 13.0–17.1)
MCH: 27.7 pg — ABNORMAL LOW (ref 28.0–33.4)
MCHC: 31.7 g/dL — ABNORMAL LOW (ref 32.0–35.9)
MCV: 87 fL (ref 82–98)
PLATELETS: 116 10*3/uL — AB (ref 145–400)
RBC: 3.83 10*6/uL — ABNORMAL LOW (ref 4.20–5.70)
RDW: 17.8 % — AB (ref 11.1–15.7)
WBC: 14.3 10*3/uL — AB (ref 4.0–10.0)

## 2016-02-20 LAB — IRON AND TIBC
%SAT: 44 % (ref 20–55)
IRON: 97 ug/dL (ref 42–163)
TIBC: 223 ug/dL (ref 202–409)
UIBC: 125 ug/dL (ref 117–376)

## 2016-02-20 LAB — MANUAL DIFFERENTIAL (CHCC SATELLITE)
ALC: 1.6 10*3/uL (ref 0.9–3.3)
ANC (CHCC HP manual diff): 12.2 10*3/uL — ABNORMAL HIGH (ref 1.5–6.5)
EOS: 2 % (ref 0–7)
LYMPH: 11 % — AB (ref 14–48)
MONO: 2 % (ref 0–13)
PLT EST ~~LOC~~: DECREASED
SEG: 85 % — AB (ref 40–75)

## 2016-02-20 LAB — FERRITIN: Ferritin: 765 ng/ml — ABNORMAL HIGH (ref 22–316)

## 2016-02-20 NOTE — Progress Notes (Signed)
Hematology and Oncology Follow Up Visit  [name] [id number] 28-Jul-1934 82 y.o. 02/20/2016   Principle Diagnosis:  Iron deficiency anemia  Current Therapy:   IV iron as indicated Supportive transfusions as needed     Interim History:  [name] is here today with his wife for follow-up. He has responded nicely to the 1 unit of blood and IV iron he received 3 weeks ago. His SOB has improved as has his energy. Hgb is up to 10.6 with an MCV of 87.  He denies having any episodes of bleeding or bruising. No lymphadenopathy found on exam.  He is wearing his supplement O2 24 hours a day and is not in any distress at this time.  No fever, chills, n/v, cough,dizziness, chest pain, palpitations, abdominal pain or changes in bowel or bladder habits. Her has constipation at times and takes colace as needed.  His cellulitis of the ankles and feet is improving. The redness is better ans his feet are now peeling. He states that he saw vascular last week and got good report. The occasional numbness and tingling in his feet is unchanged. No swelling in his extremities at this time. He wears compression stockings.  He has maintained a good appetite and is staying well hydrated. His weight is stable.   Medications:  Allergies as of 02/20/2016      Reactions   Pravastatin Sodium Other (See Comments)   Sneezing episodes, can take medication and tolerates reaction.   Pirfenidone Nausea And Vomiting      Medication List       Accurate as of 02/20/16 11:10 AM. Always use your most recent med list.          acetaminophen 325 MG tablet Commonly known as:  TYLENOL Take 2 tablets (650 mg total) by mouth every 4 (four) hours as needed for headache or mild pain.   AMBULATORY NON FORMULARY MEDICATION Rollator rolling walker with seat.  Diagnosis Physical Deconditioning 799.3   apixaban 5 MG Tabs tablet Commonly known as:  ELIQUIS Take 1 tablet (5 mg total) by mouth 2 (two) times daily.     aspirin 81 MG EC tablet Take 1 tablet (81 mg total) by mouth daily.   cephALEXin 500 MG capsule Commonly known as:  KEFLEX Take 1 capsule (500 mg total) by mouth 3 (three) times daily.   diphenhydramine-acetaminophen 25-500 MG Tabs tablet Commonly known as:  TYLENOL PM Take 1 tablet by mouth at bedtime as needed (for sleep).   docusate sodium 100 MG capsule Commonly known as:  COLACE Take 100 mg by mouth daily as needed for mild constipation.   finasteride 5 MG tablet Commonly known as:  PROSCAR Take 5 mg by mouth once a day   furosemide 20 MG tablet Commonly known as:  LASIX Take 0.5-1 tablets (10-20 mg total) by mouth See admin instructions. 10 mg every other day but increase to 20 mg if edema is evident   ketoconazole 2 % cream Commonly known as:  NIZORAL Apply 1 application topically 2 (two) times daily. TO AFFECTED AREAS OF FEET   loratadine 10 MG tablet Commonly known as:  CLARITIN Take 10 mg by mouth every morning.   midodrine 2.5 MG tablet Commonly known as:  PROAMATINE Take 1 tablet (2.5 mg total) by mouth 2 (two) times daily with a meal. ONLY TO BE TAKEN IF SYSTOLIC PRESSURE IS <120   multivitamin tablet Take 1 tablet by mouth daily.   NON FORMULARY Place 6 L into  the nose continuous.   pantoprazole 40 MG tablet Commonly known as:  PROTONIX Take 40 mg by mouth two times a day before meals   pravastatin 40 MG tablet Commonly known as:  PRAVACHOL Take 1 tablet (40 mg total) by mouth at bedtime. Take 1 tablet by mouth at  bedtim   predniSONE 10 MG tablet Commonly known as:  DELTASONE Take 1 tablet (10 mg total) by mouth daily with breakfast.       Allergies:  Allergies  Allergen Reactions  . Pravastatin Sodium Other (See Comments)    Sneezing episodes, can take medication and tolerates reaction.  . Pirfenidone Nausea And Vomiting    Past Medical History, Surgical history, Social history, and Family History were reviewed and updated.  Review  of Systems: All other 10 point review of systems is negative.   Physical Exam:  vitals were not taken for this visit.  Wt Readings from Last 3 Encounters:  02/13/16 201 lb (91.2 kg)  02/04/16 201 lb 1.9 oz (91.2 kg)  01/30/16 202 lb (91.6 kg)    Ocular: Sclerae unicteric, pupils equal, round and reactive to light Ear-nose-throat: Oropharynx clear, dentition fair Lymphatic: No cervical supraclavicular or axillary adenopathy Lungs no rales or rhonchi, good excursion bilaterally Heart regular rate and rhythm, no murmur appreciated Abd soft, nontender, positive bowel sounds, no liver or spleen tip palpated on exam  MSK no focal spinal tenderness, no joint edema Neuro: non-focal, well-oriented, appropriate affect Breasts: Deferred  Lab Results  Component Value Date   WBC 19.3 (H) 01/30/2016   HGB 8.5 (L) 01/30/2016   HCT 26.4 (L) 01/30/2016   MCV 85 01/30/2016   PLT 119 (L) 01/30/2016   Lab Results  Component Value Date   FERRITIN 840 (H) 01/30/2016   IRON 26 (L) 01/30/2016   TIBC 196 (L) 01/30/2016   UIBC 170 01/30/2016   IRONPCTSAT 13 (L) 01/30/2016   Lab Results  Component Value Date   RBC 3.11 (L) 01/30/2016   No results found for: KPAFRELGTCHN, LAMBDASER, KAPLAMBRATIO No results found for: IGGSERUM, IGA, IGMSERUM No results found for: Marda Stalker, SPEI   Chemistry      Component Value Date/Time   NA 141 01/30/2016 1039   K 4.1 01/30/2016 1039   CL 98 (L) 01/28/2016 0353   CO2 29 01/30/2016 1039   BUN 23.9 01/30/2016 1039   CREATININE 1.0 01/30/2016 1039      Component Value Date/Time   CALCIUM 9.4 01/30/2016 1039   ALKPHOS 70 01/30/2016 1039   AST 9 01/30/2016 1039   ALT 11 01/30/2016 1039   BILITOT 0.74 01/30/2016 1039     Impression and Plan: [name] is a very pleasant 82 yo white male with a year long history of iron deficiency anemia secondary to malabsorption and possible intermittent GI  blood loss on anticoagulation. He responded nicely to the IV iron and 1 unit of blood he received earlier this month.  His counts are much improved. Hgb is up to 10.6 with an MCV of 87 and platelet count of 116.  We will see what his iron studies show and bring him back in next week for an infusion if needed.  We will plan to see him back in 6 weeks for repeat lab work and follow-up.  Both he and his sweet wife know to contact our office with any questions or concerns. We can certainly see her sooner if need be.   Verdie Mosher, NP  1/26/201811:10 AM

## 2016-02-20 NOTE — Telephone Encounter (Addendum)
Patient aware of results  ----- Message from Sarah M CinciVerdie Moshernnati, NP sent at 02/20/2016  3:35 PM EST ----- Regarding: Iron  Iron studies look great! No infusion needed at this time. Thank you!  Sarah  ----- Message ----- From: Interface, Lab In Three Zero One Sent: 02/20/2016  11:30 AM To: Verdie MosherSarah M [place], NP

## 2016-02-21 LAB — RETICULOCYTES: Reticulocyte Count: 1.5 % (ref 0.6–2.6)

## 2016-03-22 ENCOUNTER — Other Ambulatory Visit (HOSPITAL_COMMUNITY): Payer: Self-pay | Admitting: Vascular Surgery

## 2016-03-22 ENCOUNTER — Encounter: Payer: Self-pay | Admitting: Adult Health

## 2016-03-22 ENCOUNTER — Encounter: Payer: Self-pay | Admitting: Family Medicine

## 2016-03-23 MED ORDER — PREDNISONE 10 MG PO TABS: 10.0000 mg | ORAL_TABLET | Freq: Every day | ORAL | 1 refills | Status: AC

## 2016-03-23 NOTE — Telephone Encounter (Signed)
Spoke with pt's wife. Pt has been scheduled to see TP on 03/25/16 at 9:30am. Nothing further was needed.

## 2016-03-23 NOTE — Telephone Encounter (Signed)
Patient's wife is returning call, CB is [phone].

## 2016-03-25 ENCOUNTER — Encounter: Payer: Self-pay | Admitting: Adult Health

## 2016-03-25 ENCOUNTER — Ambulatory Visit (INDEPENDENT_AMBULATORY_CARE_PROVIDER_SITE_OTHER): Payer: Medicare Other | Admitting: Adult Health

## 2016-03-25 DIAGNOSIS — J84112 Idiopathic pulmonary fibrosis: Secondary | ICD-10-CM | POA: Diagnosis not present

## 2016-03-25 MED ORDER — PREDNISONE 20 MG PO TABS: 20.0000 mg | ORAL_TABLET | Freq: Every day | ORAL | 0 refills | Status: DC

## 2016-03-25 NOTE — Progress Notes (Signed)
 @Patient  ID: [name], male    DOB: 01-30-1934, 82 y.o.   MRN: [id number]  Chief Complaint  Patient presents with  . Acute Visit    IPF     Referring provider: Agapito GamesMetheney, Catherine D, *  HPI: 82y.o.  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 W/ chronic resp failure on O2. And chronic steroids  Previously intolerant to Esbriet.  Hx of severe AS s/p AVR , CAD  TEST  Last 2-D echo was in January 2017 EF was 50-55%, prosthetic aortic valve was present and functioning normally.. Pulmonary artery pressure 32. Moderate pulmonary valve regurgitation. Patient had pulmonary function test 05/2015  showed a decrease in lung function since 2016 FVC 28%, FEV1 37%, ratio 93, no significant bronchodilator response, DLCO 23%. Pulmonary function test in November 2016 showed a DLCO at 35%.   03/25/2016 Acute OV : IPF  Pt presents for an acute office visit. Pt complains that over last few weeks breathing has not been as good. Gets winded more easily. Has been having trouble with his concentrator at home -DME company is working on it.  Ankles with some edema on/off . Take lasix As needed  .  No increased cough or congestion . No fever. No hemoptysis  Has had a few stressful events over last couple of months with other medical issues. Has had anemia , had iron transfusion in Jan 2018. Followed by Hematology .  Had angioplasty/stent to right superficial femoral artery in Dec 2108.    Allergies  Allergen Reactions  . Pravastatin Sodium Other (See Comments)    Sneezing episodes, can take medication and tolerates reaction.  . Pirfenidone Nausea And Vomiting    Immunization History  Administered Date(s) Administered  . Influenza Split 10/26/2011, 10/21/2014  . Influenza Whole 09/27/2012  . Influenza, High Dose Seasonal PF 10/22/2015  . Influenza,inj,Quad PF,36+ Mos 09/28/2013  . Pneumococcal Conjugate-13 09/28/2013  .  Pneumococcal Polysaccharide-23 09/25/2009  . Tdap 01/25/2011  . Zoster 10/25/2012    Past Medical History:  Diagnosis Date  . AAA (abdominal aortic aneurysm) (HCC)   . Aortic stenosis    AVR 2011  . Arthritis    "hands" (01/23/2016)  . Atrial fibrillation (HCC)    "started in 09/2015" (01/23/2016)  . Cleft palate   . Dermatitis    poison ivy  . 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 GERD (gastroesophageal reflux disease)   . Heart murmur    "before AVR"  . History of hiatal hernia   . Hyperlipidemia   . Interstitial lung disease (HCC)   . Iron (Fe) deficiency anemia    "had an infusion 07/2015" (01/23/2016)  . Iron deficiency anemia 02/02/2016  . 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 On home oxygen therapy    "6L; 24/7" (01/23/2016)  . Pulmonary fibrosis (HCC)   . PVD (peripheral vascular disease) (HCC) 01/22/2016  . Syncope and collapse 07/18/2014    Tobacco History: History  Smoking Status  . Former Smoker  . Packs/day: 1.50  . Years: 5.00  . Types: Cigarettes, Cigars  . Quit date: 09/08/1977  Smokeless Tobacco  . Never Used   Counseling given: Not Answered   Outpatient Encounter Prescriptions as of 03/25/2016  Medication Sig  . acetaminophen (TYLENOL) 325 MG tablet Take 2 tablets (650 mg total) by mouth every 4 (four) hours as needed for headache or mild pain. (Patient taking differently: Take 500 mg by mouth every 4 (four) hours as needed for  headache or mild pain. )  . AMBULATORY NON FORMULARY MEDICATION Rollator rolling walker with seat.  Diagnosis Physical Deconditioning 799.3  . apixaban (ELIQUIS) 5 MG TABS tablet Take 1 tablet (5 mg total) by mouth 2 (two) times daily.  Marland Kitchen aspirin EC 81 MG EC tablet Take 1 tablet (81 mg total) by mouth daily.  . diphenhydramine-acetaminophen (TYLENOL PM) 25-500 MG TABS tablet Take 1 tablet by mouth at bedtime as needed (for sleep).  . docusate sodium (COLACE) 100 MG capsule Take 100 mg by mouth daily as needed for mild  constipation.  . finasteride (PROSCAR) 5 MG tablet Take 5 mg by mouth once a day  . furosemide (LASIX) 20 MG tablet Take 0.5-1 tablets (10-20 mg total) by mouth See admin instructions. 10 mg every other day but increase to 20 mg if edema is evident  . ketoconazole (NIZORAL) 2 % cream Apply 1 application topically 2 (two) times daily. TO AFFECTED AREAS OF FEET  . loratadine (CLARITIN) 10 MG tablet Take 10 mg by mouth every morning.   . midodrine (PROAMATINE) 2.5 MG tablet Take 1 tablet (2.5 mg total) by mouth 2 (two) times daily with a meal. ONLY TO BE TAKEN IF SYSTOLIC PRESSURE IS <120  . Multiple Vitamin (MULTIVITAMIN) tablet Take 1 tablet by mouth daily.  . NON FORMULARY Place 6 L into the nose continuous.   . pantoprazole (PROTONIX) 40 MG tablet Take 40 mg by mouth two times a day before meals  . pravastatin (PRAVACHOL) 40 MG tablet Take 1 tablet (40 mg total) by mouth at bedtime. Take 1 tablet by mouth at  bedtim  . predniSONE (DELTASONE) 10 MG tablet Take 1 tablet (10 mg total) by mouth daily with breakfast.  . predniSONE (DELTASONE) 20 MG tablet Take 1 tablet (20 mg total) by mouth daily with breakfast. For 1 week   Facility-Administered Encounter Medications as of 03/25/2016  Medication  . iron dextran complex (INFED) 25 mg in sodium chloride 0.9 % 500 mL IVPB   Followed by  . iron dextran complex (INFED) 1,000 mg in sodium chloride 0.9 % 500 mL IVPB     Review of Systems  Constitutional:   No  weight loss, night sweats,  Fevers, chills, + fatigue, or  lassitude.  HEENT:   No headaches,  Difficulty swallowing,  Tooth/dental problems, or  Sore throat,                No sneezing, itching, ear ache, nasal congestion, post nasal drip,   CV:  No chest pain,  Orthopnea, PND,  , anasarca, dizziness, palpitations, syncope.   GI  No heartburn, indigestion, abdominal pain, nausea, vomiting, diarrhea, change in bowel habits, loss of appetite, bloody stools.   Resp:    No chest wall  deformity  Skin: no rash or lesions.  GU: no dysuria, change in color of urine, no urgency or frequency.  No flank pain, no hematuria   MS:  No joint pain or swelling.  No decreased range of motion.  No back pain.    Physical Exam  BP 126/74 (BP Location: Left Arm, Cuff Size: Normal)   Pulse 80   Ht 6\' 1"  (1.854 m)   Wt 199 lb 12.8 oz (90.6 kg)   SpO2 99%   BMI 26.36 kg/m   GEN: A/Ox3; pleasant , NAD, elderly , chronically ill appearing    HEENT:  [place]/AT,  EACs-clear, TMs-wnl, NOSE-clear, THROAT-clear, no lesions, no postnasal drip or exudate noted.   NECK:  Supple w/ fair ROM; no JVD; normal carotid impulses w/o bruits; no thyromegaly or nodules palpated; no lymphadenopathy.    RESP  BB crackles noted. . no accessory muscle use, no dullness to percussion  CARD:  RRR, no m/r/g, tr -1+ peripheral edema, pulses intact, no cyanosis or clubbing.  GI:   Soft & nt; nml bowel sounds; no organomegaly or masses detected.   Musco: Warm bil, no deformities or joint swelling noted.   Neuro: alert, no focal deficits noted.    Skin: Warm, no lesions or rashes    Lab Results:   ProBNPNo results found.   Assessment & Plan:   IPF (idiopathic pulmonary fibrosis) (HCC) ?Mild flare +/- fluid overload   Plan  Patient Instructions  Increase Prednisone 20mg  daily for 1 week then 10mg  daily and hold at this dose.  Continue on 6l/m rest , 8 l/m activity  Take  Lasix 20mg  daily for 2 days then As needed  Daily .  Low salt diet.  Follow up with Dr. Marchelle Gearing in 6 weeks and As needed   Please contact office for sooner follow up if symptoms do not improve or worsen or seek emergency care         Rubye Oaks, NP 03/25/2016

## 2016-03-25 NOTE — Patient Instructions (Addendum)
Increase Prednisone 20mg  daily for 1 week then 10mg  daily and hold at this dose.  Continue on 6l/m rest , 8 l/m activity  Take  Lasix 20mg  daily for 2 days then As needed  Daily .  Low salt diet.  Follow up with Dr. Marchelle Gearingamaswamy in 6 weeks and As needed   Please contact office for sooner follow up if symptoms do not improve or worsen or seek emergency care

## 2016-03-25 NOTE — Assessment & Plan Note (Signed)
?  Mild flare +/- fluid overload   Plan  Patient Instructions  Increase Prednisone 20mg  daily for 1 week then 10mg  daily and hold at this dose.  Continue on 6l/m rest , 8 l/m activity  Take  Lasix 20mg  daily for 2 days then As needed  Daily .  Low salt diet.  Follow up with Dr. Marchelle Gearingamaswamy in 6 weeks and As needed   Please contact office for sooner follow up if symptoms do not improve or worsen or seek emergency care

## 2016-04-02 ENCOUNTER — Ambulatory Visit (HOSPITAL_BASED_OUTPATIENT_CLINIC_OR_DEPARTMENT_OTHER): Payer: Medicare Other | Admitting: Family

## 2016-04-02 ENCOUNTER — Telehealth: Payer: Self-pay | Admitting: *Deleted

## 2016-04-02 ENCOUNTER — Other Ambulatory Visit (HOSPITAL_BASED_OUTPATIENT_CLINIC_OR_DEPARTMENT_OTHER): Payer: Medicare Other

## 2016-04-02 VITALS — BP 145/69 | HR 96 | Temp 97.9°F | Wt 202.4 lb

## 2016-04-02 DIAGNOSIS — D500 Iron deficiency anemia secondary to blood loss (chronic): Secondary | ICD-10-CM | POA: Diagnosis not present

## 2016-04-02 DIAGNOSIS — K909 Intestinal malabsorption, unspecified: Secondary | ICD-10-CM

## 2016-04-02 DIAGNOSIS — D509 Iron deficiency anemia, unspecified: Secondary | ICD-10-CM

## 2016-04-02 LAB — IRON AND TIBC
%SAT: 39 % (ref 20–55)
IRON: 87 ug/dL (ref 42–163)
TIBC: 223 ug/dL (ref 202–409)
UIBC: 136 ug/dL (ref 117–376)

## 2016-04-02 LAB — MANUAL DIFFERENTIAL (CHCC SATELLITE)
ALC: 0.6 10*3/uL — ABNORMAL LOW (ref 0.9–3.3)
ANC (CHCC MAN DIFF): 16.5 10*3/uL — AB (ref 1.5–6.5)
LYMPH: 3 % — AB (ref 14–48)
MONO: 9 % (ref 0–13)
PLT EST ~~LOC~~: DECREASED
SEG: 87 % — AB (ref 40–75)

## 2016-04-02 LAB — CBC WITH DIFFERENTIAL (CANCER CENTER ONLY)
HEMATOCRIT: 33.3 % — AB (ref 38.7–49.9)
HEMOGLOBIN: 10.7 g/dL — AB (ref 13.0–17.1)
MCH: 27.9 pg — AB (ref 28.0–33.4)
MCHC: 32.1 g/dL (ref 32.0–35.9)
MCV: 87 fL (ref 82–98)
Platelets: 130 10*3/uL — ABNORMAL LOW (ref 145–400)
RBC: 3.83 10*6/uL — ABNORMAL LOW (ref 4.20–5.70)
RDW: 16.9 % — AB (ref 11.1–15.7)
WBC: 19 10*3/uL — ABNORMAL HIGH (ref 4.0–10.0)

## 2016-04-02 LAB — FERRITIN: Ferritin: 569 ng/ml — ABNORMAL HIGH (ref 22–316)

## 2016-04-02 NOTE — Progress Notes (Signed)
Hematology and Oncology Follow Up Visit  [name] [id number] [birth year] 82 y.o. 04/02/2016   Principle Diagnosis:  Iron deficiency anemia  Current Therapy:   IV iron as indicated - last received in June 2017 Supportive transfusions as needed     Interim History:  [name] is here today with his wife for follow-up. He is doing well and feeling much better. His energy and SOB have improved. He had a problem with the hose of his nasal cannula at home and once this was fixed he has had no problems. He is now down to 4-6 L [place] supplemental O2 24 hours a day.He was able to walk in today with a cane.   His Hgb is still stable at 10.7 with an MCV of 87. Iron studies are pending.  They baby sat their grandchildren last weekend who now have strep throat. Thankfully, neither of them have gotten sick so far. No fever, chills, n/v, cough,dizziness, chest pain, palpitations, abdominal pain or changes in bowel or bladder habits.  He has constipation at times and takes colace as needed.  No swelling, tenderness, numbness or tingling in his extremities at this time. He wears compression stockings daily.  He has maintained a good appetite and is staying well hydrated. His weight is stable.   Medications:  Allergies as of 04/02/2016      Reactions   Pravastatin Sodium Other (See Comments)   Sneezing episodes, can take medication and tolerates reaction.   Pirfenidone Nausea And Vomiting      Medication List       Accurate as of 04/02/16 10:41 AM. Always use your most recent med list.          acetaminophen 325 MG tablet Commonly known as:  TYLENOL Take 2 tablets (650 mg total) by mouth every 4 (four) hours as needed for headache or mild pain.   AMBULATORY NON FORMULARY MEDICATION Rollator rolling walker with seat.  Diagnosis Physical Deconditioning 799.3   apixaban 5 MG Tabs tablet Commonly known as:  ELIQUIS Take 1 tablet (5 mg total) by mouth 2 (two) times daily.   aspirin 81 MG  EC tablet Take 1 tablet (81 mg total) by mouth daily.   diphenhydramine-acetaminophen 25-500 MG Tabs tablet Commonly known as:  TYLENOL PM Take 1 tablet by mouth at bedtime as needed (for sleep).   docusate sodium 100 MG capsule Commonly known as:  COLACE Take 100 mg by mouth daily as needed for mild constipation.   finasteride 5 MG tablet Commonly known as:  PROSCAR Take 5 mg by mouth once a day   furosemide 20 MG tablet Commonly known as:  LASIX Take 0.5-1 tablets (10-20 mg total) by mouth See admin instructions. 10 mg every other day but increase to 20 mg if edema is evident   ketoconazole 2 % cream Commonly known as:  NIZORAL Apply 1 application topically 2 (two) times daily. TO AFFECTED AREAS OF FEET   loratadine 10 MG tablet Commonly known as:  CLARITIN Take 10 mg by mouth every morning.   midodrine 2.5 MG tablet Commonly known as:  PROAMATINE Take 1 tablet (2.5 mg total) by mouth 2 (two) times daily with a meal. ONLY TO BE TAKEN IF SYSTOLIC PRESSURE IS <120   multivitamin tablet Take 1 tablet by mouth daily.   NON FORMULARY Place 6 L into the nose continuous.   pantoprazole 40 MG tablet Commonly known as:  PROTONIX Take 40 mg by mouth two times a day before meals  pravastatin 40 MG tablet Commonly known as:  PRAVACHOL Take 1 tablet (40 mg total) by mouth at bedtime. Take 1 tablet by mouth at  bedtim   predniSONE 10 MG tablet Commonly known as:  DELTASONE Take 1 tablet (10 mg total) by mouth daily with breakfast.   predniSONE 20 MG tablet Commonly known as:  DELTASONE Take 1 tablet (20 mg total) by mouth daily with breakfast. For 1 week       Allergies:  Allergies  Allergen Reactions  . Pravastatin Sodium Other (See Comments)    Sneezing episodes, can take medication and tolerates reaction.  . Pirfenidone Nausea And Vomiting    Past Medical History, Surgical history, Social history, and Family History were reviewed and updated.  Review of  Systems: All other 10 point review of systems is negative.   Physical Exam:  weight is 202 lb 6.4 oz (91.8 kg). His oral temperature is 97.9 F (36.6 C). His blood pressure is 145/69 (abnormal) and his pulse is 96. His oxygen saturation is 97%.   Wt Readings from Last 3 Encounters:  04/02/16 202 lb 6.4 oz (91.8 kg)  03/25/16 199 lb 12.8 oz (90.6 kg)  02/13/16 201 lb (91.2 kg)    Ocular: Sclerae unicteric, pupils equal, round and reactive to light Ear-nose-throat: Oropharynx clear, dentition fair Lymphatic: No cervical supraclavicular or axillary adenopathy Lungs no rales or rhonchi, good excursion bilaterally Heart regular rate and rhythm, no murmur appreciated Abd soft, nontender, positive bowel sounds, no liver or spleen tip palpated on exam  MSK no focal spinal tenderness, no joint edema Neuro: non-focal, well-oriented, appropriate affect Breasts: Deferred  Lab Results  Component Value Date   WBC 19.0 (H) 04/02/2016   HGB 10.7 (L) 04/02/2016   HCT 33.3 (L) 04/02/2016   MCV 87 04/02/2016   PLT 130 (L) 04/02/2016   Lab Results  Component Value Date   FERRITIN 765 (H) 02/20/2016   IRON 97 02/20/2016   TIBC 223 02/20/2016   UIBC 125 02/20/2016   IRONPCTSAT 44 02/20/2016   Lab Results  Component Value Date   RBC 3.83 (L) 04/02/2016   No results found for: KPAFRELGTCHN, LAMBDASER, KAPLAMBRATIO No results found for: IGGSERUM, IGA, IGMSERUM No results found for: Marda Stalker, SPEI   Chemistry      Component Value Date/Time   NA 141 01/30/2016 1039   K 4.1 01/30/2016 1039   CL 98 (L) 01/28/2016 0353   CO2 29 01/30/2016 1039   BUN 23.9 01/30/2016 1039   CREATININE 1.0 01/30/2016 1039      Component Value Date/Time   CALCIUM 9.4 01/30/2016 1039   ALKPHOS 70 01/30/2016 1039   AST 9 01/30/2016 1039   ALT 11 01/30/2016 1039   BILITOT 0.74 01/30/2016 1039     Impression and Plan: [name] is a very pleasant  82 yo white male with a year long history of iron deficiency anemia secondary to malabsorption and possible intermittent GI blood loss on anticoagulation. He has responded nicely to IV iron and has not required an infusion in over 6 months. Hgb is stable at 10.7 and his symptoms have improved quite a bit.   We will see what his iron studies show and bring him back in next week for an infusion if needed.  We will plan to see him back in 2 months for repeat lab work and follow-up.  Both he and his sweet wife know to contact our office with any questions  or concerns. We can certainly see her sooner if need be.   Verdie MosherINCINNATI,SARAH M, NP 3/9/201810:41 AM

## 2016-04-02 NOTE — Telephone Encounter (Addendum)
Patient aware of results  ----- Message from Verdie MosherSarah M [place], NP sent at 04/02/2016  3:08 PM EST ----- Regarding: Iron  Iron studies look good! No infusion needed at this time. Thank you!  Sarah  ----- Message ----- From: Interface, Lab In Three Zero One Sent: 04/02/2016  10:30 AM To: Verdie MosherSarah M [place], NP

## 2016-04-03 LAB — RETICULOCYTES: RETICULOCYTE COUNT: 1.5 % (ref 0.6–2.6)

## 2016-04-06 ENCOUNTER — Encounter (HOSPITAL_COMMUNITY): Payer: Medicare Other

## 2016-04-06 ENCOUNTER — Ambulatory Visit: Payer: Medicare Other | Admitting: Family

## 2016-04-11 ENCOUNTER — Inpatient Hospital Stay (HOSPITAL_COMMUNITY)
Admission: AD | Admit: 2016-04-11 | Discharge: 2016-04-25 | DRG: 871 | Disposition: E | Payer: Medicare Other | Source: Other Acute Inpatient Hospital | Attending: Internal Medicine | Admitting: Internal Medicine

## 2016-04-11 ENCOUNTER — Inpatient Hospital Stay (HOSPITAL_COMMUNITY): Payer: Medicare Other

## 2016-04-11 ENCOUNTER — Encounter (HOSPITAL_COMMUNITY): Payer: Self-pay | Admitting: Family Medicine

## 2016-04-11 DIAGNOSIS — Z87891 Personal history of nicotine dependence: Secondary | ICD-10-CM

## 2016-04-11 DIAGNOSIS — Z888 Allergy status to other drugs, medicaments and biological substances status: Secondary | ICD-10-CM

## 2016-04-11 DIAGNOSIS — Z9841 Cataract extraction status, right eye: Secondary | ICD-10-CM | POA: Diagnosis not present

## 2016-04-11 DIAGNOSIS — J84112 Idiopathic pulmonary fibrosis: Secondary | ICD-10-CM | POA: Diagnosis not present

## 2016-04-11 DIAGNOSIS — Z87730 Personal history of (corrected) cleft lip and palate: Secondary | ICD-10-CM

## 2016-04-11 DIAGNOSIS — I5033 Acute on chronic diastolic (congestive) heart failure: Secondary | ICD-10-CM | POA: Diagnosis present

## 2016-04-11 DIAGNOSIS — Z9981 Dependence on supplemental oxygen: Secondary | ICD-10-CM

## 2016-04-11 DIAGNOSIS — R0602 Shortness of breath: Secondary | ICD-10-CM

## 2016-04-11 DIAGNOSIS — Z515 Encounter for palliative care: Secondary | ICD-10-CM | POA: Diagnosis not present

## 2016-04-11 DIAGNOSIS — I739 Peripheral vascular disease, unspecified: Secondary | ICD-10-CM | POA: Diagnosis present

## 2016-04-11 DIAGNOSIS — J189 Pneumonia, unspecified organism: Secondary | ICD-10-CM | POA: Diagnosis not present

## 2016-04-11 DIAGNOSIS — J8410 Pulmonary fibrosis, unspecified: Secondary | ICD-10-CM | POA: Diagnosis not present

## 2016-04-11 DIAGNOSIS — R509 Fever, unspecified: Secondary | ICD-10-CM | POA: Diagnosis not present

## 2016-04-11 DIAGNOSIS — Z7901 Long term (current) use of anticoagulants: Secondary | ICD-10-CM | POA: Diagnosis not present

## 2016-04-11 DIAGNOSIS — I714 Abdominal aortic aneurysm, without rupture, unspecified: Secondary | ICD-10-CM | POA: Diagnosis present

## 2016-04-11 DIAGNOSIS — J9690 Respiratory failure, unspecified, unspecified whether with hypoxia or hypercapnia: Secondary | ICD-10-CM | POA: Diagnosis not present

## 2016-04-11 DIAGNOSIS — Z953 Presence of xenogenic heart valve: Secondary | ICD-10-CM

## 2016-04-11 DIAGNOSIS — A419 Sepsis, unspecified organism: Principal | ICD-10-CM

## 2016-04-11 DIAGNOSIS — R000 Tachycardia, unspecified: Secondary | ICD-10-CM | POA: Diagnosis present

## 2016-04-11 DIAGNOSIS — D509 Iron deficiency anemia, unspecified: Secondary | ICD-10-CM | POA: Diagnosis not present

## 2016-04-11 DIAGNOSIS — J9621 Acute and chronic respiratory failure with hypoxia: Secondary | ICD-10-CM | POA: Diagnosis present

## 2016-04-11 DIAGNOSIS — Z952 Presence of prosthetic heart valve: Secondary | ICD-10-CM

## 2016-04-11 DIAGNOSIS — I248 Other forms of acute ischemic heart disease: Secondary | ICD-10-CM | POA: Diagnosis not present

## 2016-04-11 DIAGNOSIS — Z961 Presence of intraocular lens: Secondary | ICD-10-CM | POA: Diagnosis present

## 2016-04-11 DIAGNOSIS — Z8249 Family history of ischemic heart disease and other diseases of the circulatory system: Secondary | ICD-10-CM | POA: Diagnosis not present

## 2016-04-11 DIAGNOSIS — Z8679 Personal history of other diseases of the circulatory system: Secondary | ICD-10-CM

## 2016-04-11 DIAGNOSIS — I2510 Atherosclerotic heart disease of native coronary artery without angina pectoris: Secondary | ICD-10-CM | POA: Diagnosis not present

## 2016-04-11 DIAGNOSIS — Z9842 Cataract extraction status, left eye: Secondary | ICD-10-CM

## 2016-04-11 DIAGNOSIS — M1990 Unspecified osteoarthritis, unspecified site: Secondary | ICD-10-CM | POA: Diagnosis present

## 2016-04-11 DIAGNOSIS — K219 Gastro-esophageal reflux disease without esophagitis: Secondary | ICD-10-CM | POA: Diagnosis present

## 2016-04-11 DIAGNOSIS — J9622 Acute and chronic respiratory failure with hypercapnia: Secondary | ICD-10-CM | POA: Diagnosis present

## 2016-04-11 DIAGNOSIS — I951 Orthostatic hypotension: Secondary | ICD-10-CM | POA: Diagnosis present

## 2016-04-11 DIAGNOSIS — Z9889 Other specified postprocedural states: Secondary | ICD-10-CM | POA: Diagnosis not present

## 2016-04-11 DIAGNOSIS — R600 Localized edema: Secondary | ICD-10-CM | POA: Diagnosis not present

## 2016-04-11 DIAGNOSIS — I4891 Unspecified atrial fibrillation: Secondary | ICD-10-CM | POA: Diagnosis not present

## 2016-04-11 DIAGNOSIS — Z7982 Long term (current) use of aspirin: Secondary | ICD-10-CM

## 2016-04-11 DIAGNOSIS — Z79899 Other long term (current) drug therapy: Secondary | ICD-10-CM

## 2016-04-11 DIAGNOSIS — I4819 Other persistent atrial fibrillation: Secondary | ICD-10-CM | POA: Diagnosis present

## 2016-04-11 DIAGNOSIS — D696 Thrombocytopenia, unspecified: Secondary | ICD-10-CM | POA: Diagnosis present

## 2016-04-11 DIAGNOSIS — I480 Paroxysmal atrial fibrillation: Secondary | ICD-10-CM | POA: Diagnosis not present

## 2016-04-11 DIAGNOSIS — R069 Unspecified abnormalities of breathing: Secondary | ICD-10-CM | POA: Diagnosis not present

## 2016-04-11 DIAGNOSIS — F419 Anxiety disorder, unspecified: Secondary | ICD-10-CM | POA: Diagnosis present

## 2016-04-11 DIAGNOSIS — Z809 Family history of malignant neoplasm, unspecified: Secondary | ICD-10-CM | POA: Diagnosis not present

## 2016-04-11 DIAGNOSIS — I5032 Chronic diastolic (congestive) heart failure: Secondary | ICD-10-CM | POA: Diagnosis present

## 2016-04-11 DIAGNOSIS — Z833 Family history of diabetes mellitus: Secondary | ICD-10-CM | POA: Diagnosis not present

## 2016-04-11 DIAGNOSIS — Z9049 Acquired absence of other specified parts of digestive tract: Secondary | ICD-10-CM

## 2016-04-11 DIAGNOSIS — Z66 Do not resuscitate: Secondary | ICD-10-CM | POA: Diagnosis present

## 2016-04-11 DIAGNOSIS — I481 Persistent atrial fibrillation: Secondary | ICD-10-CM | POA: Diagnosis not present

## 2016-04-11 DIAGNOSIS — Z7189 Other specified counseling: Secondary | ICD-10-CM | POA: Diagnosis not present

## 2016-04-11 DIAGNOSIS — R0682 Tachypnea, not elsewhere classified: Secondary | ICD-10-CM | POA: Diagnosis not present

## 2016-04-11 DIAGNOSIS — J810 Acute pulmonary edema: Secondary | ICD-10-CM | POA: Diagnosis not present

## 2016-04-11 LAB — CBC WITH DIFFERENTIAL/PLATELET
Basophils Absolute: 0 10*3/uL (ref 0.0–0.1)
Basophils Relative: 0 %
EOS PCT: 0 %
Eosinophils Absolute: 0 10*3/uL (ref 0.0–0.7)
HEMATOCRIT: 29 % — AB (ref 39.0–52.0)
Hemoglobin: 9.2 g/dL — ABNORMAL LOW (ref 13.0–17.0)
LYMPHS ABS: 1.2 10*3/uL (ref 0.7–4.0)
Lymphocytes Relative: 4 %
MCH: 27 pg (ref 26.0–34.0)
MCHC: 31.7 g/dL (ref 30.0–36.0)
MCV: 85 fL (ref 78.0–100.0)
MONOS PCT: 5 %
Monocytes Absolute: 1.5 10*3/uL — ABNORMAL HIGH (ref 0.1–1.0)
NEUTROS ABS: 27.1 10*3/uL — AB (ref 1.7–7.7)
Neutrophils Relative %: 91 %
Platelets: 80 10*3/uL — ABNORMAL LOW (ref 150–400)
RBC: 3.41 MIL/uL — ABNORMAL LOW (ref 4.22–5.81)
RDW: 16.7 % — AB (ref 11.5–15.5)
WBC: 29.8 10*3/uL — ABNORMAL HIGH (ref 4.0–10.5)

## 2016-04-11 LAB — TROPONIN I
TROPONIN I: 0.28 ng/mL — AB (ref <0.03)
TROPONIN I: 0.23 ng/mL — AB (ref <0.03)
Troponin I: 0.28 ng/mL (ref <0.03)

## 2016-04-11 LAB — PROTIME-INR
INR: 1.51
Prothrombin Time: 18.4 seconds — ABNORMAL HIGH (ref 11.4–15.2)

## 2016-04-11 LAB — APTT: aPTT: 39 seconds — ABNORMAL HIGH (ref 24–36)

## 2016-04-11 LAB — HEPARIN LEVEL (UNFRACTIONATED): Heparin Unfractionated: >2.2 IU/mL — ABNORMAL HIGH (ref 0.30–0.70)

## 2016-04-11 LAB — EXPECTORATED SPUTUM ASSESSMENT W GRAM STAIN, RFLX TO RESP C

## 2016-04-11 LAB — COMPREHENSIVE METABOLIC PANEL
ALT: 29 U/L (ref 17–63)
AST: 32 U/L (ref 15–41)
Albumin: 3.3 g/dL — ABNORMAL LOW (ref 3.5–5.0)
Alkaline Phosphatase: 51 U/L (ref 38–126)
Anion gap: 9 (ref 5–15)
BUN: 23 mg/dL — AB (ref 6–20)
CHLORIDE: 101 mmol/L (ref 101–111)
CO2: 31 mmol/L (ref 22–32)
Calcium: 8.3 mg/dL — ABNORMAL LOW (ref 8.9–10.3)
Creatinine, Ser: 1.21 mg/dL (ref 0.61–1.24)
GFR calc non Af Amer: 55 mL/min — ABNORMAL LOW (ref >60)
GLUCOSE: 112 mg/dL — AB (ref 65–99)
Potassium: 4.6 mmol/L (ref 3.5–5.1)
SODIUM: 141 mmol/L (ref 135–145)
Total Bilirubin: 1.2 mg/dL (ref 0.3–1.2)
Total Protein: 5.6 g/dL — ABNORMAL LOW (ref 6.5–8.1)

## 2016-04-11 LAB — MRSA PCR SCREENING: MRSA by PCR: NEGATIVE

## 2016-04-11 LAB — STREP PNEUMONIAE URINARY ANTIGEN: Strep Pneumo Urinary Antigen: NEGATIVE

## 2016-04-11 LAB — LACTIC ACID, PLASMA
LACTIC ACID, VENOUS: 2.9 mmol/L — AB (ref 0.5–1.9)
LACTIC ACID, VENOUS: 3.8 mmol/L — AB (ref 0.5–1.9)

## 2016-04-11 LAB — BRAIN NATRIURETIC PEPTIDE: B Natriuretic Peptide: 340.5 pg/mL — ABNORMAL HIGH (ref 0.0–100.0)

## 2016-04-11 MED ORDER — ASPIRIN EC 81 MG PO TBEC
81.0000 mg | DELAYED_RELEASE_TABLET | Freq: Every day | ORAL | Status: DC
  Administered 2016-04-11: 81 mg via ORAL
  Filled 2016-04-11: qty 1

## 2016-04-11 MED ORDER — ACETAMINOPHEN 325 MG PO TABS
650.0000 mg | ORAL_TABLET | Freq: Four times a day (QID) | ORAL | Status: DC | PRN
  Administered 2016-04-12: 650 mg via ORAL
  Filled 2016-04-11: qty 2

## 2016-04-11 MED ORDER — ONDANSETRON HCL 4 MG/2ML IJ SOLN: 4.0000 mg | Freq: Four times a day (QID) | INTRAMUSCULAR | Status: DC | PRN

## 2016-04-11 MED ORDER — GUAIFENESIN ER 600 MG PO TB12: 600.0000 mg | ORAL_TABLET | Freq: Two times a day (BID) | ORAL | Status: DC | PRN

## 2016-04-11 MED ORDER — ENOXAPARIN SODIUM 40 MG/0.4ML ~~LOC~~ SOLN: 40.0000 mg | SUBCUTANEOUS | Status: DC

## 2016-04-11 MED ORDER — PREDNISONE 50 MG PO TABS
50.0000 mg | ORAL_TABLET | Freq: Every day | ORAL | Status: DC
  Administered 2016-04-11: 50 mg via ORAL
  Filled 2016-04-11: qty 1

## 2016-04-11 MED ORDER — ONDANSETRON HCL 4 MG PO TABS: 4.0000 mg | ORAL_TABLET | Freq: Four times a day (QID) | ORAL | Status: DC | PRN

## 2016-04-11 MED ORDER — PANTOPRAZOLE SODIUM 40 MG PO TBEC
40.0000 mg | DELAYED_RELEASE_TABLET | Freq: Two times a day (BID) | ORAL | Status: DC
  Administered 2016-04-11 (×2): 40 mg via ORAL
  Filled 2016-04-11 (×3): qty 1

## 2016-04-11 MED ORDER — ALBUTEROL SULFATE (2.5 MG/3ML) 0.083% IN NEBU: 2.5000 mg | INHALATION_SOLUTION | RESPIRATORY_TRACT | Status: DC | PRN

## 2016-04-11 MED ORDER — APIXABAN 5 MG PO TABS
5.0000 mg | ORAL_TABLET | Freq: Two times a day (BID) | ORAL | Status: DC
Start: 2016-04-11 — End: 2016-04-11
  Administered 2016-04-11: 5 mg via ORAL
  Filled 2016-04-11: qty 1

## 2016-04-11 MED ORDER — CEFTRIAXONE SODIUM 1 G IJ SOLR
1.0000 g | INTRAMUSCULAR | Status: DC
  Administered 2016-04-11 – 2016-04-13 (×3): 1 g via INTRAVENOUS
  Filled 2016-04-11 (×3): qty 10

## 2016-04-11 MED ORDER — SODIUM CHLORIDE 0.9 % IV SOLN
INTRAVENOUS | Status: DC
  Administered 2016-04-11: 07:00:00 via INTRAVENOUS

## 2016-04-11 MED ORDER — ACETAMINOPHEN 650 MG RE SUPP: 650.0000 mg | Freq: Four times a day (QID) | RECTAL | Status: DC | PRN

## 2016-04-11 MED ORDER — FINASTERIDE 5 MG PO TABS
5.0000 mg | ORAL_TABLET | Freq: Every day | ORAL | Status: DC
  Administered 2016-04-11: 5 mg via ORAL
  Filled 2016-04-11: qty 1

## 2016-04-11 MED ORDER — SODIUM CHLORIDE 0.9% FLUSH
3.0000 mL | Freq: Two times a day (BID) | INTRAVENOUS | Status: DC
  Administered 2016-04-11 – 2016-04-13 (×4): 3 mL via INTRAVENOUS

## 2016-04-11 MED ORDER — HEPARIN (PORCINE) IN NACL 100-0.45 UNIT/ML-% IJ SOLN
1600.0000 [IU]/h | INTRAMUSCULAR | Status: DC
  Administered 2016-04-11 – 2016-04-12 (×2): 1300 [IU]/h via INTRAVENOUS
  Filled 2016-04-11 (×3): qty 250

## 2016-04-11 MED ORDER — PRAVASTATIN SODIUM 40 MG PO TABS
40.0000 mg | ORAL_TABLET | Freq: Every day | ORAL | Status: DC
  Administered 2016-04-11: 40 mg via ORAL
  Filled 2016-04-11: qty 1

## 2016-04-11 MED ORDER — AZITHROMYCIN 500 MG PO TABS
500.0000 mg | ORAL_TABLET | ORAL | Status: DC
  Administered 2016-04-12: 500 mg via ORAL
  Filled 2016-04-11 (×2): qty 1

## 2016-04-11 MED ORDER — SODIUM CHLORIDE 0.9 % IV SOLN
INTRAVENOUS | Status: DC
  Administered 2016-04-12: 500 mL via INTRAVENOUS

## 2016-04-11 NOTE — H&P (Signed)
History and Physical  Patient Name: [name]     [name]:[id number]    DOB: Jun 06, 1934    DOA: 04/12/2016 PCP: [name], MD   Patient coming from: Home --> [facility]  Chief Complaint: Cough, fever/chills, hemopytsis  HPI: [name] is a 82 y.o. male with a past medical history significant for IPF on 4-6L home O2 baseline, Afib on apixaban, AS s/p AVR, and CAD who presents with cough, fever for 1 day.  The patient was completely in his normal state of health until this morning when he developed a dry cough. His wife noticed his coughing got worse and worse over the course of the day, and this evening when he came to bed, she noticed that he was huffing and puffing, seem to be in distress, she checked his oxygen and was 85%, and he asked her to call 9-1-1.  There has been no preceding illness, no recent leg swelling, orthopnea or noct dyspnea.    ED course: -Temp 101.61F, heart rate 150s and in A. fib, blood pressure 100/60, respiratory rate 22+ -Na 140, K 4.4, Cr 0.8, WBC 36K (baseline appears to be 15-20 over last 2 years), Hgb 10 (at baseline) -Lactic acid 3.4 -Trace troponin elevation -ProBNP WNL -ABG showed pH 7.45, pCO2 53, pO2 61 -CXR appeared to show bilateral opacities, and he was coughing up new bloody sputum -He got diltiazem 15 mg bolus followed by 5 mg/h, and heart rate improved to 110-120 -He got 1200 mL fluids -Influenza negative -Zosyn and Zithromax were given -His blood pressure remained stable at 100/60 (near his baseline), and family requested transfer to [facility], and so [facility] were asked to accept the patient in transfer     ROS: Review of Systems  Constitutional: Positive for chills, fever and malaise/fatigue.  Respiratory: Positive for cough, hemoptysis, sputum production and shortness of breath.   Cardiovascular: Positive for chest pain. Negative for orthopnea, leg swelling and PND.  All other systems reviewed and are negative.          Past Medical History:  Diagnosis Date  . AAA (abdominal aortic aneurysm) (HCC)   . Aortic stenosis    AVR 2011  . Arthritis    "hands" (01/23/2016)  . Atrial fibrillation (HCC)    "started in 09/2015" (01/23/2016)  . Cleft palate   . Dermatitis    poison ivy  . 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 GERD (gastroesophageal reflux disease)   . Heart murmur    "before AVR"  . History of hiatal hernia   . Hyperlipidemia   . Interstitial lung disease (HCC)   . Iron (Fe) deficiency anemia    "had an infusion 07/2015" (01/23/2016)  . Iron deficiency anemia 02/02/2016  . 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 On home oxygen therapy    "6L; 24/7" (01/23/2016)  . Pulmonary fibrosis (HCC)   . PVD (peripheral vascular disease) (HCC) 01/22/2016  . Syncope and collapse 07/18/2014    Past Surgical History:  Procedure Laterality Date  . CARDIAC CATHETERIZATION  09/2009  . CARDIAC VALVE REPLACEMENT    . CATARACT EXTRACTION W/ INTRAOCULAR LENS  IMPLANT, BILATERAL Bilateral 2004  . CLEFT PALATE REPAIR  1938 - ? X 7  . INGUINAL HERNIA REPAIR Right 1950's  . LAPAROSCOPIC CHOLECYSTECTOMY  1999  . oral surgery with bone graft from hip for cleft palate  1989  . PERIPHERAL VASCULAR CATHETERIZATION N/A 01/27/2016   Procedure: Abdominal Aortogram w/ Bilateral Lower Extremity Runoff;  Surgeon: Chuck Hint,  MD;  Location: [facility] INVASIVE CV LAB;  Service: Cardiovascular;  Laterality: N/A;  . PERIPHERAL VASCULAR CATHETERIZATION  01/27/2016   Procedure: Peripheral Vascular Intervention-Right SFA 7x80 [place];  Surgeon: Chuck Hint, MD;  Location: [facility] INVASIVE CV [facility];  Service: Cardiovascular;;  . ROTATOR CUFF REPAIR Left 08/2007  . TISSUE AORTIC VALVE REPLACEMENT  10/15/2009   w/ pericardial tissue valve; Dr Barry Dienes    Social History: Patient lives with his wife.  The patient walks with a Roll-a-tor.  He is a former smoker.  He was an Barrister's clerk.  He has no dementia and is  independent with all ADLs.    Allergies  Allergen Reactions  . Pravastatin Sodium Other (See Comments)    Sneezing episodes, can take medication and tolerates reaction.  . Pirfenidone Nausea And Vomiting    Family history: family history includes Cancer in his father and sister; Diabetes in his sister; Heart attack in his brother, father, and mother; Heart murmur in his brother; Hyperlipidemia in his brother; Hypertension in his brother.  Prior to Admission medications   Medication Sig Start Date End Date Taking? Authorizing Provider  acetaminophen (TYLENOL) 325 MG tablet Take 2 tablets (650 mg total) by mouth every 4 (four) hours as needed for headache or mild pain. Patient taking differently: Take 500 mg by mouth every 4 (four) hours as needed for headache or mild pain.  01/28/16   Raymond Gurney, PA-C  AMBULATORY NON FORMULARY MEDICATION Rollator rolling walker with seat.  Diagnosis Physical Deconditioning 799.3 08/14/13   Laren Boom, DO  apixaban (ELIQUIS) 5 MG TABS tablet Take 1 tablet (5 mg total) by mouth 2 (two) times daily. 10/21/15   Lewayne Bunting, MD  aspirin EC 81 MG EC tablet Take 1 tablet (81 mg total) by mouth daily. 01/28/16   Raymond Gurney, PA-C  diphenhydramine-acetaminophen (TYLENOL PM) 25-500 MG TABS tablet Take 1 tablet by mouth at bedtime as needed (for sleep).    Historical Provider, MD  docusate sodium (COLACE) 100 MG capsule Take 100 mg by mouth daily as needed for mild constipation.    Historical Provider, MD  finasteride (PROSCAR) 5 MG tablet Take 5 mg by mouth once a day 01/28/16   Raymond Gurney, PA-C  furosemide (LASIX) 20 MG tablet Take 0.5-1 tablets (10-20 mg total) by mouth See admin instructions. 10 mg every other day but increase to 20 mg if edema is evident 01/28/16   Raymond Gurney, PA-C  ketoconazole (NIZORAL) 2 % cream Apply 1 application topically 2 (two) times daily. TO AFFECTED AREAS OF FEET 01/28/16   Raymond Gurney, PA-C  loratadine (CLARITIN) 10  MG tablet Take 10 mg by mouth every morning.     Historical Provider, MD  midodrine (PROAMATINE) 2.5 MG tablet Take 1 tablet (2.5 mg total) by mouth 2 (two) times daily with a meal. ONLY TO BE TAKEN IF SYSTOLIC PRESSURE IS <120 01/28/16   Raymond Gurney, PA-C  Multiple Vitamin (MULTIVITAMIN) tablet Take 1 tablet by mouth daily.    Historical Provider, MD  NON FORMULARY Place 6 L into the nose continuous.     Historical Provider, MD  pantoprazole (PROTONIX) 40 MG tablet Take 40 mg by mouth two times a day before meals 01/28/16   Raymond Gurney, PA-C  pravastatin (PRAVACHOL) 40 MG tablet Take 1 tablet (40 mg total) by mouth at bedtime. Take 1 tablet by mouth at  bedtim 01/28/16   Raymond Gurney, PA-C  predniSONE (DELTASONE)  10 MG tablet Take 1 tablet (10 mg total) by mouth daily with breakfast. 03/23/16   Kalman Shan, MD  predniSONE (DELTASONE) 20 MG tablet Take 1 tablet (20 mg total) by mouth daily with breakfast. For 1 week 03/25/16   Julio Sicks, NP       Physical Exam: BP 101/74 (BP Location: Right Arm)   Pulse (!) 112   Temp 98.5 F (36.9 C) (Oral)   Resp (!) 23   Ht 6\' 1"  (1.854 m)   Wt 93.5 kg (206 lb 2.1 oz)   SpO2 100%   BMI 27.20 kg/m  General appearance: Well-developed, elderly adult male, alert and in no acute distress.   Eyes: Anicteric, conjunctiva pink, lids and lashes normal. PERRL.    ENT: No nasal deformity, discharge, epistaxis.  Hearing normal. OP moist without lesions.  Cleft palate. Neck: No neck masses.  Trachea midline.  No thyromegaly/tenderness. Lymph: No cervical or supraclavicular lymphadenopathy. Skin: Warm and dry.  No jaundice.  No suspicious rashes or lesions. Cardiac: Tachycardic, irregular, nl S1-S2, no murmurs appreciated.  Capillary refill is brisk.  JVP normal.  Trace LE edema.  Radial and DP pulses 2+ and symmetric. Respiratory: Normal respiratory rate and rhythm.  Fine crackles throughout bilaterally. Abdomen: Abdomen soft.  No TTP. No  ascites, distension, hepatosplenomegaly.   MSK: No deformities or effusions.  No cyanosis or clubbing. Neuro: Cranial nerves normal.  Sensation intact to light touch. Speech is fluent.  Muscle strength normal.    Psych: Sensorium intact and responding to questions, attention normal.  Behavior appropriate.  Affect normal.  Judgment and insight appear normal.     Labs on Admission:  I have personally reviewed following labs and imaging studies: CBC: No results for input(s): WBC, NEUTROABS, HGB, HCT, MCV, PLT in the last 168 hours. Basic Metabolic Panel: No results for input(s): NA, K, CL, CO2, GLUCOSE, BUN, CREATININE, CALCIUM, MG, PHOS in the last 168 hours. GFR: CrCl cannot be calculated (Patient's most recent lab result is older than the maximum 21 days allowed.).  Liver Function Tests: No results for input(s): AST, ALT, ALKPHOS, BILITOT, PROT, ALBUMIN in the last 168 hours. No results for input(s): LIPASE, AMYLASE in the last 168 hours. No results for input(s): AMMONIA in the last 168 hours. Coagulation Profile: No results for input(s): INR, PROTIME in the last 168 hours. Cardiac Enzymes: No results for input(s): CKTOTAL, CKMB, CKMBINDEX, TROPONINI in the last 168 hours. BNP (last 3 results)  Recent Labs  06/17/15 1017  PROBNP 210.0*   HbA1C: No results for input(s): HGBA1C in the last 72 hours. CBG: No results for input(s): GLUCAP in the last 168 hours. Lipid Profile: No results for input(s): CHOL, HDL, LDLCALC, TRIG, CHOLHDL, LDLDIRECT in the last 72 hours. Thyroid Function Tests: No results for input(s): TSH, T4TOTAL, FREET4, T3FREE, THYROIDAB in the last 72 hours. Anemia Panel: No results for input(s): VITAMINB12, FOLATE, FERRITIN, TIBC, IRON, RETICCTPCT in the last 72 hours. Sepsis Labs: Lactic acid 3.4 Invalid input(s): PROCALCITONIN, LACTICIDVEN No results found for this or any previous visit (from the past 240 hour(s)).       Radiological Exams on  Admission: Personally reviewed CXR shows bilateral infiltrates, probably worse compared to his baseline in Dec 2017:   Echocardiogram 2017 report reviewed: EF 50-55% PAP 32 Moderate PVR        Assessment/Plan  1. Sepsis from pneumonia:  Suspected source pneumonia. Organism unknown.   Patient meets criteria given tachycardia, tachypnea, fever, leukocytosis, and evidence  of organ dysfunction.  Lactate 3.4 mmol/L and repeat ordered within 6 hours.  This patient is at high risk of poor outcomes with a qSOFA score of 2.  Antibiotics delivered in the ED.    -Sepsis bundle utilized:  -Blood cultures drawn at [facility], obtain sputum culture here  -Fluid bolus given in ED, will repeat lactic acid  -Antibiotics: ceftriaxone and azithromycin  -Repeat renal function and complete blood count in AM  -Urine pneumonia antigens  -Trend procalcitonin  -Stress dose steroids      2. Idiopathic pulmonary fibrosis:  -Continue home O2 -Prednisone 50 mg for 3 days, back to normal 20 mg on 3/21  3. Chronic diastolic CHF and CAD:  EF 50% at baseline.  Pro-BNP normal, no sxs to suggest fluid overload. -Hold furosemide for now -Monitor I/Os -Trend troponin  4. . Atrial fibrillation, now in RVR:  CHADS2Vasc 4.  On apixaban, no rate control at baseline.  En route to [facility], BP dropped and diltiazem was stopped, but HR has remained 100-110 here. -Continue apixaban -Monitor HR for now  5. Iron deficiency anemia:  At baseline  6. Orthostatic hypotension:  -Hold Midodrine for now  7. Other medications:  -Continue finasteride -Continue PPI     DVT prophylaxis: Lovenox  Code Status: FULL  Family Communication: Wife at bedside  Disposition Plan: Anticipate IV fluids, empiric antibiotics and follow culture data. Consults called: None Admission status: INPATIENT          Medical decision making: Patient seen at 6:19 AM on 03/26/2016.  The patient was discussed with Dr. Rollen SoxBrinkley.  What  exists of the patient's chart was reviewed in depth and outside reocrds were summarized above.  Clinical condition: improving, BP has remained stable, appears to be comfortable on home O2 by [place], mentating well.        Alberteen SamChristopher P Danford [facility] Pager [phone]     At the time of admission, it appears that the appropriate admission status for this patient is INPATIENT. This is judged to be reasonable and necessary in order to provide the required intensity of service to ensure the patient's safety given the presenting symptoms, physical exam findings, and initial radiographic and laboratory data in the context of their chronic comorbidities.  Together, these circumstances are felt to place him at high risk for further clinical deterioration threatening life, limb, or organ.   Patient requires inpatient status due to high intensity of service, high risk for further deterioration and high frequency of surveillance required because of this severe exacerbation of their chronic organ failure and acute illness that poses a threat to life, limb or bodily function.  I certify that at the point of admission it is my clinical judgment that the patient will require inpatient hospital care spanning beyond 2 midnights from the point of admission and that early discharge would result in unnecessary risk of decompensation and readmission or threat to life, limb or bodily function.

## 2016-04-11 NOTE — Progress Notes (Signed)
PROGRESS NOTE  [name]  [name]:[id number] DOB: 03/29/1934 DOA: 04/10/2016 PCP: [name], MD Outpatient Specialists:  Subjective: Seen with his his wife at bedside, he is awake and alert, controlled breathing is better than yesterday. O2 saturation is in the high 90s.  Brief Narrative:  [name] is a 82 y.o. male with a past medical history significant for IPF on 4-6L home O2 baseline, Afib on apixaban, AS s/p bioprosthetic AVR, and CAD who presents with cough, fever for 1 day.  The patient was completely in his normal state of health until this morning when he developed a dry cough. His wife noticed his coughing got worse and worse over the course of the day, and this evening when he came to bed, she noticed that he was huffing and puffing, seem to be in distress, she checked his oxygen and was 85%, and he asked her to call 9-1-1.  There has been no preceding illness, no recent leg swelling, orthopnea or noct dyspnea.    Assessment & Plan:   Principal Problem:   Sepsis (HCC) Active Problems:   Persistent atrial fibrillation (HCC)   S/P AVR (aortic valve replacement)   Orthostatic hypotension   Interstitial pulmonary fibrosis (HCC)   AAA (abdominal aortic aneurysm) without rupture (HCC)   Thrombocytopenia (HCC)   Iron deficiency anemia   Chronic diastolic CHF (congestive heart failure) (HCC)   Community acquired pneumonia   Acute on chronic respiratory failure with hypoxia and hypercapnia (HCC)   This is a no charge note, patient seen earlier today by my colleague Dr. Maryfrances Bunnell. Patient seen and examined, and data base reviewed, pneumonia in the settings of IPF.  Sepsis -Suspected source pneumonia. Organism unknown.  -Present on admission with a heart rate of 112, WBC of 29.8 and presence of pneumonia. Lactate was 3.4. -Treated with aggressive hydration with IV fluids and antibiotics. -This is improving.  Community acquired pneumonia -Presented with  cough, shortness of breath and hypoxia, CXR (from OSH) showed pneumonia. -Repeat chest x-ray, continue Rocephin and Zithromax. -Continue supportive management with bronchodilators, mucolytics and oxygen as needed, stop IV fluids.  Acute on chronic respiratory failure with hypoxia -Patient is on 4-6 L of oxygen at home, oxygen saturation reportedly was in the 50s on 6 L on presentation. -Currently sats in the upper 90s on 6 L of oxygen. This is resolved.    Idiopathic pulmonary fibrosis:  -Continue home O2 -Prednisone 50 mg for 3 days, back to normal 10 mg on 3/21, -If develop low blood pressure again, I will definitely start stress dose of steroid on him.  Chronic diastolic CHF and CAD:  EF 50% at baseline.  Pro-BNP normal, no sxs to suggest fluid overload. -Hold furosemide for now -Monitor I/Os -Trend troponin  Atrial fibrillation, now in RVR:  -CHADS2Vasc 4.  On apixaban, no rate control at baseline.  En route to [facility], BP dropped and diltiazem was stopped, but HR has remained 100-110 here. -Continue apixaban -Monitor HR for now  Iron deficiency anemia:  At baseline  Orthostatic hypotension:  -Hold Midodrine for now  Other medications:  -Continue finasteride -Continue PPI   DVT prophylaxis:  Code Status: Full Code Family Communication:  Disposition Plan:  Diet: Diet Heart Room service appropriate? Yes; Fluid consistency: Thin  Consultants:   None  Procedures:   None  Antimicrobials:   Rocephin and Zithromax   Objective: Vitals:   04/20/2016 0516 04/14/2016 0800  BP: 101/74 104/74  Pulse: (!) 112 (!) 112  Resp: Marland Kitchen)  23 (!) 24  Temp: 98.5 F (36.9 C) 99.3 F (37.4 C)  TempSrc: Oral Oral  SpO2: 100%   Weight: 93.5 kg (206 lb 2.1 oz)   Height: 6\' 1"  (1.854 m)     Intake/Output Summary (Last 24 hours) at 04/22/2016 0933 Last data filed at 04/12/2016 [id number]  Gross per 24 hour  Intake                0 ml  Output              100 ml  Net             -100 ml    Filed Weights   04/03/2016 0516  Weight: 93.5 kg (206 lb 2.1 oz)    Examination: General exam: Appears calm and comfortable  Respiratory system: Clear to auscultation. Respiratory effort normal. Cardiovascular system: S1 & S2 heard, RRR. No JVD, murmurs, rubs, gallops or clicks. No pedal edema. Gastrointestinal system: Abdomen is nondistended, soft and nontender. No organomegaly or masses felt. Normal bowel sounds heard. Central nervous system: Alert and oriented. No focal neurological deficits. Extremities: Symmetric 5 x 5 power. Skin: No rashes, lesions or ulcers Psychiatry: Judgement and insight appear normal. Mood & affect appropriate.   Data Reviewed: I have personally reviewed following labs and imaging studies  CBC:  Recent Labs Lab 04/10/2016 0611  WBC 29.8*  NEUTROABS 27.1*  HGB 9.2*  HCT 29.0*  MCV 85.0  PLT 80*   Basic Metabolic Panel:  Recent Labs Lab 04/22/2016 0611  NA 141  K 4.6  CL 101  CO2 31  GLUCOSE 112*  BUN 23*  CREATININE 1.21  CALCIUM 8.3*   GFR: Estimated Creatinine Clearance: 55.9 mL/min (by C-G formula based on SCr of 1.21 mg/dL). Liver Function Tests:  Recent Labs Lab 04/12/2016 0611  AST 32  ALT 29  ALKPHOS 51  BILITOT 1.2  PROT 5.6*  ALBUMIN 3.3*   No results for input(s): LIPASE, AMYLASE in the last 168 hours. No results for input(s): AMMONIA in the last 168 hours. Coagulation Profile: No results for input(s): INR, PROTIME in the last 168 hours. Cardiac Enzymes: No results for input(s): CKTOTAL, CKMB, CKMBINDEX, TROPONINI in the last 168 hours. BNP (last 3 results)  Recent Labs  06/17/15 1017  PROBNP 210.0*   HbA1C: No results for input(s): HGBA1C in the last 72 hours. CBG: No results for input(s): GLUCAP in the last 168 hours. Lipid Profile: No results for input(s): CHOL, HDL, LDLCALC, TRIG, CHOLHDL, LDLDIRECT in the last 72 hours. Thyroid Function Tests: No results for input(s): TSH, T4TOTAL, FREET4, T3FREE,  THYROIDAB in the last 72 hours. Anemia Panel: No results for input(s): VITAMINB12, FOLATE, FERRITIN, TIBC, IRON, RETICCTPCT in the last 72 hours. Urine analysis:    Component Value Date/Time   COLORURINE YELLOW 01/23/2016 0109   APPEARANCEUR CLEAR 01/23/2016 0109   LABSPEC 1.015 01/23/2016 0109   PHURINE 5.0 01/23/2016 0109   GLUCOSEU NEGATIVE 01/23/2016 0109   HGBUR NEGATIVE 01/23/2016 0109   HGBUR 2+ 11/14/2010 0926   BILIRUBINUR NEGATIVE 01/23/2016 0109   BILIRUBINUR negative 09/09/2015 1043   KETONESUR NEGATIVE 01/23/2016 0109   PROTEINUR NEGATIVE 01/23/2016 0109   UROBILINOGEN 1.0 09/09/2015 1043   UROBILINOGEN 4 (H) 06/11/2014 1626   NITRITE NEGATIVE 01/23/2016 0109   LEUKOCYTESUR NEGATIVE 01/23/2016 0109   Sepsis Labs: @LABRCNTIP (procalcitonin:4,lacticidven:4)  ) Recent Results (from the past 240 hour(s))  MRSA PCR Screening     Status: None   Collection Time: 04/15/2016  6:46 AM  Result Value Ref Range Status   MRSA by PCR NEGATIVE NEGATIVE Final    Comment:        The GeneXpert MRSA Assay (FDA approved for NASAL specimens only), is one component of a comprehensive MRSA colonization surveillance program. It is not intended to diagnose MRSA infection nor to guide or monitor treatment for MRSA infections.   Culture, sputum-assessment     Status: None   Collection Time: 2016-04-20  7:19 AM  Result Value Ref Range Status   Specimen Description EXPECTORATED SPUTUM  Final   Special Requests NONE  Final   Sputum evaluation THIS SPECIMEN IS ACCEPTABLE FOR SPUTUM CULTURE  Final   Report Status April 20, 2016 FINAL  Final     Invalid input(s): PROCALCITONIN, LACTICACIDVEN   Radiology Studies: No results found.      Scheduled Meds: . apixaban  5 mg Oral BID  . aspirin EC  81 mg Oral Daily  . [START ON 04/12/2016] azithromycin  500 mg Oral Q24H  . cefTRIAXone (ROCEPHIN)  IV  1 g Intravenous Q24H  . finasteride  5 mg Oral Daily  . pantoprazole  40 mg Oral BID AC    . pravastatin  40 mg Oral QHS  . predniSONE  50 mg Oral Q breakfast  . sodium chloride flush  3 mL Intravenous Q12H   Continuous Infusions: . sodium chloride 125 mL/hr at 04/20/16 0701     LOS: 0 days    Time spent: 35 minutes    Kellis Topete A, MD [facility] Pager [phone]  If 7PM-7AM, please contact night-coverage [email or web address] Password [facility] 04-20-2016, 9:33 AM

## 2016-04-11 NOTE — Progress Notes (Signed)
ANTICOAGULATION CONSULT NOTE - Initial Consult  Pharmacy Consult for Heparin  Indication: chest pain/ACS  Allergies  Allergen Reactions  . Pravastatin Sodium Other (See Comments)    Sneezing episodes, can take medication and tolerates reaction.  . Pirfenidone Nausea And Vomiting    Patient Measurements: Height: 6\' 1"  (185.4 cm) Weight: 206 lb 2.1 oz (93.5 kg) IBW/kg (Calculated) : 79.9 Heparin Dosing Weight: 93.5 kg  Vital Signs: Temp: 99.3 F (37.4 C) (03/18 0800) Temp Source: Oral (03/18 0800) BP: 104/74 (03/18 0800) Pulse Rate: 112 (03/18 0800)  Labs:  Recent Labs  2016-09-30 0611 2016-09-30 0950  HGB 9.2*  --   HCT 29.0*  --   PLT 80*  --   CREATININE 1.21  --   TROPONINI  --  0.28*    Estimated Creatinine Clearance: 55.9 mL/min (by C-G formula based on SCr of 1.21 mg/dL).   Medical History: Past Medical History:  Diagnosis Date  . AAA (abdominal aortic aneurysm) (HCC)   . Aortic stenosis    AVR 2011  . Arthritis    "hands" (01/23/2016)  . Atrial fibrillation (HCC)    "started in 09/2015" (01/23/2016)  . Cleft palate   . Dermatitis    poison ivy  . 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 GERD (gastroesophageal reflux disease)   . Heart murmur    "before AVR"  . History of hiatal hernia   . Hyperlipidemia   . Interstitial lung disease (HCC)   . Iron (Fe) deficiency anemia    "had an infusion 07/2015" (01/23/2016)  . Iron deficiency anemia 02/02/2016  . 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 On home oxygen therapy    "6L; 24/7" (01/23/2016)  . Pulmonary fibrosis (HCC)   . PVD (peripheral vascular disease) (HCC) 01/22/2016  . Syncope and collapse 07/18/2014    Medications:  Prescriptions Prior to Admission  Medication Sig Dispense Refill Last Dose  . acetaminophen (TYLENOL) 325 MG tablet Take 2 tablets (650 mg total) by mouth every 4 (four) hours as needed for headache or mild pain. (Patient taking differently: Take 500 mg by mouth every 4 (four)  hours as needed for headache or mild pain. )   Taking  . AMBULATORY NON FORMULARY MEDICATION Rollator rolling walker with seat.  Diagnosis Physical Deconditioning 799.3 1 Units 0 Taking  . apixaban (ELIQUIS) 5 MG TABS tablet Take 1 tablet (5 mg total) by mouth 2 (two) times daily. 60 tablet 11 Taking  . aspirin EC 81 MG EC tablet Take 1 tablet (81 mg total) by mouth daily.   Taking  . diphenhydramine-acetaminophen (TYLENOL PM) 25-500 MG TABS tablet Take 1 tablet by mouth at bedtime as needed (for sleep).   Taking  . docusate sodium (COLACE) 100 MG capsule Take 100 mg by mouth daily as needed for mild constipation.   Taking  . finasteride (PROSCAR) 5 MG tablet Take 5 mg by mouth once a day 90 tablet 0 Taking  . furosemide (LASIX) 20 MG tablet Take 0.5-1 tablets (10-20 mg total) by mouth See admin instructions. 10 mg every other day but increase to 20 mg if edema is evident   Taking  . ketoconazole (NIZORAL) 2 % cream Apply 1 application topically 2 (two) times daily. TO AFFECTED AREAS OF FEET   Taking  . loratadine (CLARITIN) 10 MG tablet Take 10 mg by mouth every morning.    Taking  . midodrine (PROAMATINE) 2.5 MG tablet Take 1 tablet (2.5 mg total) by mouth 2 (two) times daily with a  meal. ONLY TO BE TAKEN IF SYSTOLIC PRESSURE IS <120   Taking  . Multiple Vitamin (MULTIVITAMIN) tablet Take 1 tablet by mouth daily.   Taking  . NON FORMULARY Place 6 L into the nose continuous.    Taking  . pantoprazole (PROTONIX) 40 MG tablet Take 40 mg by mouth two times a day before meals 180 tablet 0 Taking  . pravastatin (PRAVACHOL) 40 MG tablet Take 1 tablet (40 mg total) by mouth at bedtime. Take 1 tablet by mouth at  bedtim   Taking  . predniSONE (DELTASONE) 10 MG tablet Take 1 tablet (10 mg total) by mouth daily with breakfast. 90 tablet 1 Taking  . predniSONE (DELTASONE) 20 MG tablet Take 1 tablet (20 mg total) by mouth daily with breakfast. For 1 week 7 tablet 0    Scheduled:  . aspirin EC  81 mg Oral  Daily  . [START ON 04/12/2016] azithromycin  500 mg Oral Q24H  . cefTRIAXone (ROCEPHIN)  IV  1 g Intravenous Q24H  . finasteride  5 mg Oral Daily  . pantoprazole  40 mg Oral BID AC  . pravastatin  40 mg Oral QHS  . predniSONE  50 mg Oral Q breakfast  . sodium chloride flush  3 mL Intravenous Q12H   Infusions:  . sodium chloride     PRN: acetaminophen **OR** acetaminophen, albuterol, guaiFENesin, ondansetron **OR** ondansetron (ZOFRAN) IV  Assessment: Patient is a 68 yom admitted w/ cough on apixaban PTA for a fib. Pharmacy was consulted to switch patient to heparin in setting of ACS/STEMI. Patient's last dose of apixaban was 3/18 at around 10:00AM.  Baseline CBC shows hemoglobin low at 9.2 (has been low in past) and platelet count low at 80k (has been low, but this is lower than in past).   Goal of Therapy:  Heparin level 0.3-0.7 units/ml Monitor platelets by anticoagulation protocol: Yes   Plan:  Start heparin at 1300 units/hr at 2200 on 3/18, no bolus 8-hour HL and aPTT  Daily aPTT, HL, and CBC  Monitor for s/sx of bleeding F/U cards plan  Carylon Perches, PharmD Acute Care Pharmacy Resident  Pager: [phone] 04/10/2016

## 2016-04-12 ENCOUNTER — Inpatient Hospital Stay (HOSPITAL_COMMUNITY): Payer: Medicare Other

## 2016-04-12 DIAGNOSIS — I951 Orthostatic hypotension: Secondary | ICD-10-CM

## 2016-04-12 DIAGNOSIS — J9621 Acute and chronic respiratory failure with hypoxia: Secondary | ICD-10-CM

## 2016-04-12 DIAGNOSIS — D509 Iron deficiency anemia, unspecified: Secondary | ICD-10-CM

## 2016-04-12 DIAGNOSIS — I481 Persistent atrial fibrillation: Secondary | ICD-10-CM

## 2016-04-12 DIAGNOSIS — I714 Abdominal aortic aneurysm, without rupture: Secondary | ICD-10-CM

## 2016-04-12 DIAGNOSIS — J810 Acute pulmonary edema: Secondary | ICD-10-CM

## 2016-04-12 DIAGNOSIS — J189 Pneumonia, unspecified organism: Secondary | ICD-10-CM

## 2016-04-12 DIAGNOSIS — Z7189 Other specified counseling: Secondary | ICD-10-CM

## 2016-04-12 DIAGNOSIS — Z952 Presence of prosthetic heart valve: Secondary | ICD-10-CM

## 2016-04-12 DIAGNOSIS — I5032 Chronic diastolic (congestive) heart failure: Secondary | ICD-10-CM

## 2016-04-12 DIAGNOSIS — J84112 Idiopathic pulmonary fibrosis: Secondary | ICD-10-CM

## 2016-04-12 DIAGNOSIS — R0602 Shortness of breath: Secondary | ICD-10-CM

## 2016-04-12 LAB — BLOOD GAS, ARTERIAL
ACID-BASE EXCESS: 4.5 mmol/L — AB (ref 0.0–2.0)
Acid-Base Excess: 5.7 mmol/L — ABNORMAL HIGH (ref 0.0–2.0)
BICARBONATE: 30.8 mmol/L — AB (ref 20.0–28.0)
BICARBONATE: 31.5 mmol/L — AB (ref 20.0–28.0)
DELIVERY SYSTEMS: POSITIVE
DRAWN BY: 275531
DRAWN BY: 275531
Expiratory PAP: 8
FIO2: 100
FIO2: 50
Inspiratory PAP: 16
O2 SAT: 98.3 %
O2 Saturation: 95.2 %
PH ART: 7.278 — AB (ref 7.350–7.450)
Patient temperature: 98.6
Patient temperature: 98.6
RATE: 15 resp/min
pCO2 arterial: 68.1 mmHg (ref 32.0–48.0)
pCO2 arterial: 62.1 mmHg — ABNORMAL HIGH (ref 32.0–48.0)
pH, Arterial: 7.325 — ABNORMAL LOW (ref 7.350–7.450)
pO2, Arterial: 140 mmHg — ABNORMAL HIGH (ref 83.0–108.0)
pO2, Arterial: 82.4 mmHg — ABNORMAL LOW (ref 83.0–108.0)

## 2016-04-12 LAB — BASIC METABOLIC PANEL
Anion gap: 7 (ref 5–15)
BUN: 26 mg/dL — AB (ref 6–20)
CO2: 30 mmol/L (ref 22–32)
CREATININE: 0.99 mg/dL (ref 0.61–1.24)
Calcium: 8.5 mg/dL — ABNORMAL LOW (ref 8.9–10.3)
Chloride: 100 mmol/L — ABNORMAL LOW (ref 101–111)
GFR calc Af Amer: >60 mL/min (ref >60)
GFR calc non Af Amer: >60 mL/min (ref >60)
GLUCOSE: 100 mg/dL — AB (ref 65–99)
POTASSIUM: 4.9 mmol/L (ref 3.5–5.1)
SODIUM: 137 mmol/L (ref 135–145)

## 2016-04-12 LAB — CBC
HCT: 28.4 % — ABNORMAL LOW (ref 39.0–52.0)
HEMOGLOBIN: 9 g/dL — AB (ref 13.0–17.0)
MCH: 27 pg (ref 26.0–34.0)
MCHC: 31.7 g/dL (ref 30.0–36.0)
MCV: 85.3 fL (ref 78.0–100.0)
Platelets: 79 10*3/uL — ABNORMAL LOW (ref 150–400)
RBC: 3.33 MIL/uL — AB (ref 4.22–5.81)
RDW: 17.5 % — AB (ref 11.5–15.5)
WBC: 31 10*3/uL — AB (ref 4.0–10.5)

## 2016-04-12 LAB — TROPONIN I: TROPONIN I: 0.19 ng/mL — AB (ref <0.03)

## 2016-04-12 LAB — LACTIC ACID, PLASMA: LACTIC ACID, VENOUS: 1.8 mmol/L (ref 0.5–1.9)

## 2016-04-12 LAB — APTT: APTT: 90 s — AB (ref 24–36)

## 2016-04-12 LAB — BRAIN NATRIURETIC PEPTIDE: B NATRIURETIC PEPTIDE 5: 180 pg/mL — AB (ref 0.0–100.0)

## 2016-04-12 LAB — LEGIONELLA PNEUMOPHILA SEROGP 1 UR AG: L. pneumophila Serogp 1 Ur Ag: NEGATIVE

## 2016-04-12 LAB — HEPARIN LEVEL (UNFRACTIONATED): Heparin Unfractionated: 2.06 IU/mL — ABNORMAL HIGH (ref 0.30–0.70)

## 2016-04-12 MED ORDER — LEVALBUTEROL HCL 0.63 MG/3ML IN NEBU
0.6300 mg | INHALATION_SOLUTION | Freq: Four times a day (QID) | RESPIRATORY_TRACT | Status: DC
  Administered 2016-04-12 – 2016-04-13 (×5): 0.63 mg via RESPIRATORY_TRACT
  Filled 2016-04-12 (×7): qty 3

## 2016-04-12 MED ORDER — MORPHINE SULFATE (PF) 2 MG/ML IV SOLN
1.0000 mg | INTRAVENOUS | Status: DC | PRN
  Administered 2016-04-12 – 2016-04-13 (×5): 2 mg via INTRAVENOUS
  Filled 2016-04-12 (×5): qty 1

## 2016-04-12 MED ORDER — FUROSEMIDE 10 MG/ML IJ SOLN
60.0000 mg | Freq: Once | INTRAMUSCULAR | Status: AC
  Administered 2016-04-12: 60 mg via INTRAVENOUS
  Filled 2016-04-12: qty 6

## 2016-04-12 MED ORDER — HYDROCORTISONE NA SUCCINATE PF 100 MG IJ SOLR
50.0000 mg | Freq: Four times a day (QID) | INTRAMUSCULAR | Status: DC
  Administered 2016-04-12 – 2016-04-13 (×4): 50 mg via INTRAVENOUS
  Filled 2016-04-12 (×4): qty 2

## 2016-04-12 MED ORDER — FUROSEMIDE 10 MG/ML IJ SOLN
40.0000 mg | Freq: Once | INTRAMUSCULAR | Status: AC
  Administered 2016-04-12: 40 mg via INTRAVENOUS
  Filled 2016-04-12: qty 4

## 2016-04-12 NOTE — Consult Note (Signed)
Name: [name] MRN: [id number] DOB: Jan 01, 1935    ADMISSION DATE:  05-11-2016 CONSULTATION DATE: 04/12/2016  REFERRING MD :  Delena Serve  CHIEF COMPLAINT:  Worsening dyspnea  BRIEF PATIENT DESCRIPTION:   SIGNIFICANT EVENTS:   Admit 05/11/2016 BIPAP 11-May-2016  STUDIES:  CXR 3/19>>worsening pulmonary edema superimposed pulmonary fibrosis with possible underlying infection   HISTORY OF PRESENT ILLNESS:    [name] is a 82 y.o. male with a past medical history significant for IPF on 4-6L home O2 baseline, Afib on apixaban, AS s/p AVR, and CAD who presents with dyspnea  cough, fever for 1 day.Lactic acid 3.4 on admission, tachycardic, in a fib. CXR indicates bilateral opacities, and patient coughing up new bloody sputum. Influenza is negative. He was originally taken by ambulance to [facility] ED, and family requested transfer to [facility].He was admitted by [facility], treated with aggressive IV hydration, Rocephin and Zithromax. He has gotten progressively worse overnight requiring BIPAP this am. CCM was consulted .  PAST MEDICAL HISTORY :   has a past medical history of AAA (abdominal aortic aneurysm) (HCC); Aortic stenosis; Arthritis; Atrial fibrillation (HCC); Cleft palate; Dermatitis; Erythropoietin deficiency anemia (02/02/2016); GERD (gastroesophageal reflux disease); Heart murmur; History of hiatal hernia; Hyperlipidemia; Interstitial lung disease (HCC); Iron (Fe) deficiency anemia; Iron deficiency anemia (02/02/2016); Malabsorption of iron (02/02/2016); On home oxygen therapy; Pulmonary fibrosis (HCC); PVD (peripheral vascular disease) (HCC) (01/22/2016); and Syncope and collapse (07/18/2014).  has a past surgical history that includes oral surgery with bone graft from hip for cleft palate (1989); Tissue aortic valve replacement (10/15/2009); Cardiac valve replacement; Cataract extraction w/ intraocular lens  implant, bilateral (Bilateral, 2004); Rotator cuff repair  (Left, 08/2007); Cleft palate repair [phone] - ? X 7); Laparoscopic cholecystectomy (1999); Inguinal hernia repair (Right, 1950's); Cardiac catheterization (09/2009); Cardiac catheterization (N/A, 01/27/2016); and Cardiac catheterization (01/27/2016). Prior to Admission medications   Medication Sig Start Date End Date Taking? Authorizing Provider  acetaminophen (TYLENOL) 325 MG tablet Take 2 tablets (650 mg total) by mouth every 4 (four) hours as needed for headache or mild pain. Patient taking differently: Take 500 mg by mouth every 4 (four) hours as needed for headache or mild pain.  01/28/16  Yes Raymond Gurney, PA-C  apixaban (ELIQUIS) 5 MG TABS tablet Take 1 tablet (5 mg total) by mouth 2 (two) times daily. 10/21/15  Yes Lewayne Bunting, MD  aspirin EC 81 MG EC tablet Take 1 tablet (81 mg total) by mouth daily. 01/28/16  Yes Raymond Gurney, PA-C  docusate sodium (COLACE) 100 MG capsule Take 100 mg by mouth daily as needed for mild constipation.   Yes Historical Provider, MD  finasteride (PROSCAR) 5 MG tablet Take 5 mg by mouth once a day 01/28/16  Yes Raymond Gurney, PA-C  furosemide (LASIX) 20 MG tablet Take 0.5-1 tablets (10-20 mg total) by mouth See admin instructions. 10 mg every other day but increase to 20 mg if edema is evident Patient taking differently: Take 40 mg by mouth daily. 10 mg every other day but increase to 20 mg if edema is evident 01/28/16  Yes Raymond Gurney, PA-C  loratadine (CLARITIN) 10 MG tablet Take 10 mg by mouth every morning.    Yes Historical Provider, MD  midodrine (PROAMATINE) 2.5 MG tablet Take 1 tablet (2.5 mg total) by mouth 2 (two) times daily with a meal. ONLY TO BE TAKEN IF SYSTOLIC PRESSURE IS <120 01/28/16  Yes Raymond Gurney, PA-C  Multiple Vitamin (  MULTIVITAMIN) tablet Take 1 tablet by mouth daily.   Yes Historical Provider, MD  pantoprazole (PROTONIX) 40 MG tablet Take 40 mg by mouth two times a day before meals 01/28/16  Yes Raymond GurneyKimberly A Trinh, PA-C  pravastatin  (PRAVACHOL) 40 MG tablet Take 1 tablet (40 mg total) by mouth at bedtime. Take 1 tablet by mouth at  bedtim 01/28/16  Yes Kimberly A Trinh, PA-C  predniSONE (DELTASONE) 10 MG tablet Take 1 tablet (10 mg total) by mouth daily with breakfast. 03/23/16  Yes Kalman ShanMurali Ramaswamy, MD  AMBULATORY NON FORMULARY MEDICATION Rollator rolling walker with seat.  Diagnosis Physical Deconditioning 799.3 08/14/13   Sean Hommel, DO  diphenhydramine-acetaminophen (TYLENOL PM) 25-500 MG TABS tablet Take 1 tablet by mouth at bedtime as needed (for sleep).    Historical Provider, MD  ketoconazole (NIZORAL) 2 % cream Apply 1 application topically 2 (two) times daily. TO AFFECTED AREAS OF FEET 01/28/16   Raymond GurneyKimberly A Trinh, PA-C  NON FORMULARY Place 6 L into the nose continuous.     Historical Provider, MD   Allergies  Allergen Reactions  . Pravastatin Sodium Other (See Comments)    Sneezing episodes, can take medication and tolerates reaction.  . Pirfenidone Nausea And Vomiting    FAMILY HISTORY:  family history includes Cancer in his father and sister; Diabetes in his sister; Heart attack in his brother, father, and mother; Heart murmur in his brother; Hyperlipidemia in his brother; Hypertension in his brother. SOCIAL HISTORY:  reports that he quit smoking about 38 years ago. His smoking use included Cigarettes and Cigars. He has a 7.50 pack-year smoking history. He has never used smokeless tobacco. He reports that he does not drink alcohol or use drugs.  REVIEW OF SYSTEMS:   Constitutional:+ for fever, chills, No weight loss, +malaise/fatigue and diaphoresis.  HENT: Negative for hearing loss, ear pain, nosebleeds, congestion, sore throat, neck pain, tinnitus and ear discharge.   Eyes: Negative for blurred vision, double vision, photophobia, pain, discharge and redness.  Respiratory: Positive  for cough, +hemoptysis, +sputum production, +shortness of breath, No wheezing or  stridor.   Cardiovascular: Negative for chest  pain, palpitations, orthopnea, claudication, leg swelling and PND.  Gastrointestinal: Negative for heartburn, nausea, vomiting, abdominal pain, diarrhea, constipation, blood in stool and melena.  Genitourinary: Negative for dysuria, urgency, frequency, hematuria and flank pain.  Musculoskeletal: Negative for myalgias, back pain, joint pain and falls.  Skin: Negative for itching and rash.  Neurological: Negative for dizziness, tingling, tremors, sensory change, speech change, focal weakness, seizures, loss of consciousness, weakness and headaches.  Endo/Heme/Allergies: Negative for environmental allergies and polydipsia. Is chronically anticoagulated for a fib per cards SUBJECTIVE:   VITAL SIGNS: Temp:  [97.6 F (36.4 C)-98.4 F (36.9 C)] 97.7 F (36.5 C) (03/19 0737) Pulse Rate:  [88-110] 109 (03/19 1100) Resp:  [12-28] 26 (03/19 1100) BP: (118-152)/(64-92) 131/79 (03/19 1100) SpO2:  [95 %-100 %] 98 % (03/19 1100) Weight:  [208 lb 15.9 oz (94.8 kg)] 208 lb 15.9 oz (94.8 kg) (03/19 0402)  PHYSICAL EXAMINATION: General:  Alert and appropriate on BIPAP, Following Commands, Sats 100% Neuro: Alert and Oriented x 3, MAE x 4, follows commands, answering appropriately. HEENT:  Atraumatic, Normocephalic, BiPAP mask Cardiovascular: Tachycardia, atrial fibrillation,S1, S2, + Murmur, no rub.  Lungs: Rhonchi throughout, with rales half way up. Abdomen: Soft, flat, non-distended, BS + Musculoskeletal:  No Obvious deformities Skin:  Intact, no obvious bruising or tears   Recent Labs Lab 03-31-16 0611 04/12/16 0229  NA  141 137  K 4.6 4.9  CL 101 100*  CO2 31 30  BUN 23* 26*  CREATININE 1.21 0.99  GLUCOSE 112* 100*    Recent Labs Lab 04/08/2016 0611 04/12/16 0229  HGB 9.2* 9.0*  HCT 29.0* 28.4*  WBC 29.8* 31.0*  PLT 80* 79*   Dg Chest Port 1 View  Result Date: 04/12/2016 CLINICAL DATA:  SOB worse since yesterday EXAM: PORTABLE CHEST 1 VIEW COMPARISON:  04/03/2016; 01/05/2016;  chest CT - 01/23/2016 FINDINGS: Grossly unchanged enlarged cardiac silhouette and mediastinal contours given persistently reduced lung volumes. There is persistent rightward tracheal deviation at the level of the thoracic inlet. Post median sternotomy and aortic valve replacement. The pulmonary vasculature remains indistinct with cephalization of flow. Suspected increase in size of trace bilateral effusions. Rather extensive coarsened interstitial opacities appear grossly unchanged. Worsening bibasilar opacities, left greater than right. No pneumothorax. No acute osseus abnormalities. IMPRESSION: Findings most suggestive of worsening pulmonary edema superimposed on background of pulmonary fibrosis, though note, underlying infection is not excluded. Electronically Signed   By: Simonne Come M.D.   On: 04/12/2016 08:38   Dg Chest Port 1 View  Result Date: 04/06/2016 CLINICAL DATA:  Pneumonia with sepsis.  Shortness of breath. EXAM: PORTABLE CHEST 1 VIEW COMPARISON:  01/05/2016 FINDINGS: Previous median sternotomy and aortic valve replacement. Chronic cardiomegaly. Background pattern of chronic pulmonary fibrosis. Pulmonary density diffusely increased above that, which could be due to a combination of edema, pneumonia and ARDS. No evidence of large effusion. IMPRESSION: Background pulmonary fibrosis. Diffuse lung density above that consistent with edema, pneumonia, ARDS. Electronically Signed   By: Paulina Fusi M.D.   On: 04/10/2016 14:15    ASSESSMENT / PLAN:  Multi-factorial Acute on Chronic Respiratory Failure 2/2 CHF, IPF, Suspected pneumonia A:  Worsening Dyspnea over night CXR with Pulmonary edema and ? Infiltrate per Right side. Home oxygen 4-6 L ( 24/7 per wife) ABG improved after BiPAP and lasix  Plan: CXR 3/20 Lasix as ordered per Primary Oxygen/ BiPap to maintain oxygen saturations 88-92% ( BIPAP prn) Repeat ABG after lasix and BiPAP Trial ABX per [facility]  Sepsis A: Hemodynamically  stable at present Pneumonia( CAP) per CXR ( ? Source) Afebrile  Plan: Trend CBC/ WBC and Lactate  CXR 3/20 Continue Rocephin and Zithromax. Continued Schedule BD, Mucolytics, oxygen therapy  Pulmonary Edema/ Acute on Chronic diastolic CHF  Elevated Troponin( Suspect demand ischemia, but recent CP per wife) Continued Tachycardia  A: Vascular congestion per CXR Worsening Dyspnea Plan: Minimize IVF Trend BNP/ Troponin Echo ( Last EF 50-55% 01/2015)  IPF A:  Underlying disease  Wears home oxygen ar 4-6 L 24/7 Plan: Echo to evaluate Pulmonary Artery Pressures Titrate Oxygen/BIPAP to maintain saturations 88-92% Goal is return to home baseline oxygen 4-6 L  Continue prednisone 50 mg daily per [facility] ( Home dose is 10 mg daily) Resolve acute etiologies ( CHF/Pulmonary Edema/ Pneumonia) Out Patient Follow Up after discharge  I did speak with the patient's wife and sons about the plan of care. They verbalized understanding.  Bevelyn Ngo, AGACNP-BC [facility] Pager: [phone]  04/12/2016, 12:02 PM   Attending Note:  82 year old male with history of IPF and CHF who presented with pneumonia, had some hypotension who was given fluid then started to develop respiratory failure.  Patient was diuresed and started on BiPAP.  On exam, patient appears more comfortable on BiPAP with decreased BS on the left.  I reviewed CT myself, pulmonary  fibrosis noted.  CXR with pulmonary edema.  Discussed with PCCM-NP.  Acute respiratory failure on chronic:  - BiPAP for comfort  - Titrate O2 for sat  - Treat pulmonary edema  Acute pulmonary edema:  - Diureses  - KVO IVF  IPF:  - Titrate O2 for sat of 88-92%  - Steroids as ordered  GOC:  - Need to discuss goals of care when wife is available  - Recommend palliative care involvement.  Patient seen and examined, agree with above note.  I dictated the care and orders written for this patient under my  direction.  Alyson Reedy, MD [phone]

## 2016-04-12 NOTE — Progress Notes (Signed)
ANTICOAGULATION CONSULT NOTE - Initial Consult  Pharmacy Consult for Heparin  Indication: chest pain/ACS  Allergies  Allergen Reactions  . Pravastatin Sodium Other (See Comments)    Sneezing episodes, can take medication and tolerates reaction.  . Pirfenidone Nausea And Vomiting    Patient Measurements: Height: 6' (182.9 cm) Weight: 208 lb 15.9 oz (94.8 kg) IBW/kg (Calculated) : 77.6 Heparin Dosing Weight: 93.5 kg  Vital Signs: Temp: 97.7 F (36.5 C) (03/19 0737) Temp Source: Axillary (03/19 0737) BP: 131/79 (03/19 1100) Pulse Rate: 109 (03/19 1100)  Labs:  Recent Labs  10-08-16 0611  10-08-16 1211 10-08-16 1746 10-08-16 2317 04/12/16 0229 04/12/16 1105  HGB 9.2*  --   --   --   --  9.0*  --   HCT 29.0*  --   --   --   --  28.4*  --   PLT 80*  --   --   --   --  79*  --   APTT  --   --  39*  --   --   --  90*  LABPROT  --   --  18.4*  --   --   --   --   INR  --   --  1.51  --   --   --   --   HEPARINUNFRC  --   --  >2.20*  --   --   --  2.06*  CREATININE 1.21  --   --   --   --  0.99  --   TROPONINI  --   < > 0.28* 0.23* 0.19*  --   --   < > = values in this interval not displayed.  Estimated Creatinine Clearance: 72.3 mL/min (by C-G formula based on SCr of 0.99 mg/dL).  Assessment: Patient is a [age] yom admitted w/ cough on apixaban PTA for a fib. Pharmacy was consulted to switch patient to heparin in setting of ACS/STEMI. Patient's last dose of apixaban was 3/18 at around 10:00AM.  Anticoag: Heparin for ACS/STEMI, holding PTA apixaban for a fib currently.  HL 2.06, aptt 90 on 1300 units/hr (will use aptt until apixaban affects are gone)  Renal: SCr 0.99  Heme/Onc: H&H 9/28.4, Plt 79  Goal of Therapy:  Heparin level 0.3-0.7 units/ml Monitor platelets by anticoagulation protocol: Yes   Plan:  Continue heparin 1300 units/hr  Daily aPTT, HL, and CBC  Monitor for s/sx of bleeding F/U cards plan  Isaac BlissMichael Breyton Vanscyoc, PharmD, BCPS, BCCCP Clinical  Pharmacist Clinical phone for 04/12/2016 from 7a-3:30p: [phone] If after 3:30p, please call main pharmacy at: [phone] 04/12/2016 12:31 PM

## 2016-04-12 NOTE — Progress Notes (Addendum)
Upon morning assessment patient report increased shortness of breath and dyspnea. Dr. Arthor CaptainElmahi called and updated on patients condition new orders received. Will implement and continue to monitor

## 2016-04-12 NOTE — Progress Notes (Signed)
PROGRESS NOTE  [name]  [name]:[id number] DOB: 1935/01/17 DOA: 03/31/2016 PCP: [name], MD Outpatient Specialists:  Subjective: Seen with his his wife at bedside, feels more short of breath today, still has tachycardia. Feels his breathing is labored, he was getting fluids overnight, this is discontinued, Lasix given. Morphine given, BiPAP, Lasix, ABG, PCCM to evaluate.  Brief Narrative:  [name] is a 82 y.o. male with a past medical history significant for IPF on 4-6L home O2 baseline, Afib on apixaban, AS s/p bioprosthetic AVR, and CAD who presents with cough, fever for 1 day.  The patient was completely in his normal state of health until this morning when he developed a dry cough. His wife noticed his coughing got worse and worse over the course of the day, and this evening when he came to bed, she noticed that he was huffing and puffing, seem to be in distress, she checked his oxygen and was 85%, and he asked her to call 9-1-1.  There has been no preceding illness, no recent leg swelling, orthopnea or noct dyspnea.    Assessment & Plan:   Principal Problem:   Sepsis (HCC) Active Problems:   Persistent atrial fibrillation (HCC)   S/P AVR (aortic valve replacement)   Orthostatic hypotension   Interstitial pulmonary fibrosis (HCC)   AAA (abdominal aortic aneurysm) without rupture (HCC)   Thrombocytopenia (HCC)   Iron deficiency anemia   Chronic diastolic CHF (congestive heart failure) (HCC)   Community acquired pneumonia   Acute on chronic respiratory failure with hypoxia and hypercapnia (HCC)   Sepsis -Suspected source pneumonia. Organism unknown.  -Present on admission with a heart rate of 112, WBC of 29.8 and presence of pneumonia. Lactate was 3.4. -Treated with aggressive hydration with IV fluids and antibiotics.  Pulmonary edema, acute -CXR showed interstitial edema consistent with acute pulmonary edema on top of his IPF. -Patient was given  IV fluids overnight, discontinue and start on Lasix. -Check ABG patient will be on BiPAP.  Community acquired pneumonia -Presented with cough, shortness of breath and hypoxia, CXR (from OSH) showed pneumonia. -Repeat chest x-ray, continue Rocephin and Zithromax. -Continue supportive management with bronchodilators, mucolytics and oxygen as needed, stop IV fluids.  Acute on chronic respiratory failure with hypoxia -Patient is on 4-6 L of oxygen at home, oxygen saturation reportedly was in the 50s on 6 L on presentation. -Developed labored breathing, chest x-ray worse with more fluids. -Started on diuresis, BiPAP and PCCM to evaluate.  Acute on chronic diastolic CHF  -2-D echo in January 2017 showed LVEF of 50% with high filling parameters. -Was on Lasix at home this is held on admission.IV fluid given because of sepsis. -He looks more fluid overloaded, CXR showed more interstitial edema. -IV fluids discontinued, started on Lasix and BiPAP  Elevated troponin  -This is likely secondary to acute on chronic diastolic CHF. -Unlikely to be secondary to ACS, likely demand ischemia from tachycardia and CHF.    Idiopathic pulmonary fibrosis:  -Continue home O2 -Prednisone 50 mg for 3 days, back to normal 10 mg on 3/21, -If develop low blood pressure again, I will definitely start stress dose of steroid on him.  Atrial fibrillation, now in RVR:  -CHADS2Vasc 4.  On apixaban, no rate control at baseline.  En route to [facility], BP dropped and diltiazem was stopped, but HR has remained 100-110 here. -Eliquis held started on heparin drip.  Iron deficiency anemia:  At baseline  Orthostatic hypotension:  -Hold Midodrine for now  Other medications:  -Continue finasteride -Continue PPI   DVT prophylaxis:  Code Status: Full Code Family Communication:  Disposition Plan:  Diet: Diet Heart Room service appropriate? Yes; Fluid consistency: Thin  Consultants:   None  Procedures:    None  Antimicrobials:   Rocephin and Zithromax   Objective: Vitals:   04/12/16 0400 04/12/16 0402 04/12/16 0600 04/12/16 0737  BP: (!) 149/91  133/90 (!) 152/92  Pulse: (!) 101  94 (!) 110  Resp:    (!) 28  Temp:    97.7 F (36.5 C)  TempSrc:    Axillary  SpO2: 100%  100% 95%  Weight:  94.8 kg (208 lb 15.9 oz)    Height:        Intake/Output Summary (Last 24 hours) at 04/12/16 1004 Last data filed at 04/12/16 0708  Gross per 24 hour  Intake          1693.35 ml  Output             1100 ml  Net           593.35 ml   Filed Weights   11/22/2016 0516 04/12/16 0402  Weight: 93.5 kg (206 lb 2.1 oz) 94.8 kg (208 lb 15.9 oz)    Examination: General exam: Appears calm and comfortable  Respiratory system: Clear to auscultation. Respiratory effort normal. Cardiovascular system: S1 & S2 heard, RRR. No JVD, murmurs, rubs, gallops or clicks. No pedal edema. Gastrointestinal system: Abdomen is nondistended, soft and nontender. No organomegaly or masses felt. Normal bowel sounds heard. Central nervous system: Alert and oriented. No focal neurological deficits. Extremities: Symmetric 5 x 5 power. Skin: No rashes, lesions or ulcers Psychiatry: Judgement and insight appear normal. Mood & affect appropriate.   Data Reviewed: I have personally reviewed following labs and imaging studies  CBC:  Recent Labs Lab 11/22/2016 0611 04/12/16 0229  WBC 29.8* 31.0*  NEUTROABS 27.1*  --   HGB 9.2* 9.0*  HCT 29.0* 28.4*  MCV 85.0 85.3  PLT 80* 79*   Basic Metabolic Panel:  Recent Labs Lab 11/22/2016 0611 04/12/16 0229  NA 141 137  K 4.6 4.9  CL 101 100*  CO2 31 30  GLUCOSE 112* 100*  BUN 23* 26*  CREATININE 1.21 0.99  CALCIUM 8.3* 8.5*   GFR: Estimated Creatinine Clearance: 68.4 mL/min (by C-G formula based on SCr of 0.99 mg/dL). Liver Function Tests:  Recent Labs Lab 11/22/2016 0611  AST 32  ALT 29  ALKPHOS 51  BILITOT 1.2  PROT 5.6*  ALBUMIN 3.3*   No results for  input(s): LIPASE, AMYLASE in the last 168 hours. No results for input(s): AMMONIA in the last 168 hours. Coagulation Profile:  Recent Labs Lab 11/22/2016 1211  INR 1.51   Cardiac Enzymes:  Recent Labs Lab 11/22/2016 0950 11/22/2016 1211 11/22/2016 1746 11/22/2016 2317  TROPONINI 0.28* 0.28* 0.23* 0.19*   BNP (last 3 results)  Recent Labs  06/17/15 1017  PROBNP 210.0*   HbA1C: No results for input(s): HGBA1C in the last 72 hours. CBG: No results for input(s): GLUCAP in the last 168 hours. Lipid Profile: No results for input(s): CHOL, HDL, LDLCALC, TRIG, CHOLHDL, LDLDIRECT in the last 72 hours. Thyroid Function Tests: No results for input(s): TSH, T4TOTAL, FREET4, T3FREE, THYROIDAB in the last 72 hours. Anemia Panel: No results for input(s): VITAMINB12, FOLATE, FERRITIN, TIBC, IRON, RETICCTPCT in the last 72 hours. Urine analysis:    Component Value Date/Time   COLORURINE YELLOW 01/23/2016 0109  APPEARANCEUR CLEAR 01/23/2016 0109   LABSPEC 1.015 01/23/2016 0109   PHURINE 5.0 01/23/2016 0109   GLUCOSEU NEGATIVE 01/23/2016 0109   HGBUR NEGATIVE 01/23/2016 0109   HGBUR 2+ 11/14/2010 0926   BILIRUBINUR NEGATIVE 01/23/2016 0109   BILIRUBINUR negative 09/09/2015 1043   KETONESUR NEGATIVE 01/23/2016 0109   PROTEINUR NEGATIVE 01/23/2016 0109   UROBILINOGEN 1.0 09/09/2015 1043   UROBILINOGEN 4 (H) 06/11/2014 1626   NITRITE NEGATIVE 01/23/2016 0109   LEUKOCYTESUR NEGATIVE 01/23/2016 0109   Sepsis Labs: @LABRCNTIP (procalcitonin:4,lacticidven:4)  ) Recent Results (from the past 240 hour(s))  MRSA PCR Screening     Status: None   Collection Time: 2016-05-09  6:46 AM  Result Value Ref Range Status   MRSA by PCR NEGATIVE NEGATIVE Final    Comment:        The GeneXpert MRSA Assay (FDA approved for NASAL specimens only), is one component of a comprehensive MRSA colonization surveillance program. It is not intended to diagnose MRSA infection nor to guide or monitor treatment  for MRSA infections.   Culture, sputum-assessment     Status: None   Collection Time: 05/09/16  7:19 AM  Result Value Ref Range Status   Specimen Description EXPECTORATED SPUTUM  Final   Special Requests NONE  Final   Sputum evaluation THIS SPECIMEN IS ACCEPTABLE FOR SPUTUM CULTURE  Final   Report Status 05-09-2016 FINAL  Final  Culture, respiratory (NON-Expectorated)     Status: None (Preliminary result)   Collection Time: 05/09/16  7:19 AM  Result Value Ref Range Status   Specimen Description EXPECTORATED SPUTUM  Final   Special Requests NONE Reflexed from [id number]  Final   Gram Stain   Final    ABUNDANT WBC PRESENT, PREDOMINANTLY PMN RARE SQUAMOUS EPITHELIAL CELLS PRESENT FEW GRAM POSITIVE COCCI IN PAIRS RARE GRAM POSITIVE RODS    Culture PENDING  Incomplete   Report Status PENDING  Incomplete     Invalid input(s): PROCALCITONIN, LACTICACIDVEN   Radiology Studies: Dg Chest Port 1 View  Result Date: 04/12/2016 CLINICAL DATA:  SOB worse since yesterday EXAM: PORTABLE CHEST 1 VIEW COMPARISON:  May 09, 2016; 01/05/2016; chest CT - 01/23/2016 FINDINGS: Grossly unchanged enlarged cardiac silhouette and mediastinal contours given persistently reduced lung volumes. There is persistent rightward tracheal deviation at the level of the thoracic inlet. Post median sternotomy and aortic valve replacement. The pulmonary vasculature remains indistinct with cephalization of flow. Suspected increase in size of trace bilateral effusions. Rather extensive coarsened interstitial opacities appear grossly unchanged. Worsening bibasilar opacities, left greater than right. No pneumothorax. No acute osseus abnormalities. IMPRESSION: Findings most suggestive of worsening pulmonary edema superimposed on background of pulmonary fibrosis, though note, underlying infection is not excluded. Electronically Signed   By: Simonne Come M.D.   On: 04/12/2016 08:38   Dg Chest Port 1 View  Result Date: 05/09/16 CLINICAL  DATA:  Pneumonia with sepsis.  Shortness of breath. EXAM: PORTABLE CHEST 1 VIEW COMPARISON:  01/05/2016 FINDINGS: Previous median sternotomy and aortic valve replacement. Chronic cardiomegaly. Background pattern of chronic pulmonary fibrosis. Pulmonary density diffusely increased above that, which could be due to a combination of edema, pneumonia and ARDS. No evidence of large effusion. IMPRESSION: Background pulmonary fibrosis. Diffuse lung density above that consistent with edema, pneumonia, ARDS. Electronically Signed   By: Paulina Fusi M.D.   On: 05-09-16 14:15        Scheduled Meds: . aspirin EC  81 mg Oral Daily  . azithromycin  500 mg Oral Q24H  . cefTRIAXone (ROCEPHIN)  IV  1 g Intravenous Q24H  . finasteride  5 mg Oral Daily  . levalbuterol  0.63 mg Nebulization Q6H  . pantoprazole  40 mg Oral BID AC  . pravastatin  40 mg Oral QHS  . predniSONE  50 mg Oral Q breakfast  . sodium chloride flush  3 mL Intravenous Q12H   Continuous Infusions: . heparin 1,300 Units/hr (04/09/2016 2203)     LOS: 1 day    Time spent: 35 minutes    Boyd Litaker A, MD [facility] Pager [phone]  If 7PM-7AM, please contact night-coverage [email or web address] Password [email or web address] 04/12/2016, 10:04 AM

## 2016-04-12 NOTE — Progress Notes (Signed)
Patient ID: Roy BundeRobert G Monjaras, male   DOB: December 02, 1934, 82 y.o.   MRN: [id number] Called by RN, as directed by Primary to evaluate steroid dosing Unable to take oral and int borderline BP pred 50 daily Change to hydrocortisone 50 q6h stress doing for now  Mcarthur Rossettianiel J. Tyson AliasFeinstein, MD, FACP Pgr: [phone] [facility]

## 2016-04-13 ENCOUNTER — Inpatient Hospital Stay (HOSPITAL_COMMUNITY): Payer: Medicare Other

## 2016-04-13 DIAGNOSIS — J9622 Acute and chronic respiratory failure with hypercapnia: Secondary | ICD-10-CM

## 2016-04-13 DIAGNOSIS — Z515 Encounter for palliative care: Secondary | ICD-10-CM

## 2016-04-13 DIAGNOSIS — J8410 Pulmonary fibrosis, unspecified: Secondary | ICD-10-CM

## 2016-04-13 LAB — BASIC METABOLIC PANEL
ANION GAP: 13 (ref 5–15)
BUN: 27 mg/dL — ABNORMAL HIGH (ref 6–20)
CHLORIDE: 94 mmol/L — AB (ref 101–111)
CO2: 31 mmol/L (ref 22–32)
Calcium: 8.7 mg/dL — ABNORMAL LOW (ref 8.9–10.3)
Creatinine, Ser: 1.12 mg/dL (ref 0.61–1.24)
GFR calc Af Amer: >60 mL/min (ref >60)
GFR calc non Af Amer: >60 mL/min (ref >60)
Glucose, Bld: 106 mg/dL — ABNORMAL HIGH (ref 65–99)
POTASSIUM: 4.8 mmol/L (ref 3.5–5.1)
SODIUM: 138 mmol/L (ref 135–145)

## 2016-04-13 LAB — CBC
HEMATOCRIT: 28.2 % — AB (ref 39.0–52.0)
Hemoglobin: 9 g/dL — ABNORMAL LOW (ref 13.0–17.0)
MCH: 26.9 pg (ref 26.0–34.0)
MCHC: 31.9 g/dL (ref 30.0–36.0)
MCV: 84.4 fL (ref 78.0–100.0)
Platelets: 112 10*3/uL — ABNORMAL LOW (ref 150–400)
RBC: 3.34 MIL/uL — AB (ref 4.22–5.81)
RDW: 17.2 % — ABNORMAL HIGH (ref 11.5–15.5)
WBC: 25.4 10*3/uL — ABNORMAL HIGH (ref 4.0–10.5)

## 2016-04-13 LAB — CULTURE, RESPIRATORY W GRAM STAIN: Culture: NORMAL

## 2016-04-13 LAB — HEPARIN LEVEL (UNFRACTIONATED): HEPARIN UNFRACTIONATED: 1.32 [IU]/mL — AB (ref 0.30–0.70)

## 2016-04-13 LAB — CULTURE, RESPIRATORY

## 2016-04-13 LAB — PROCALCITONIN: Procalcitonin: 8.06 ng/mL

## 2016-04-13 LAB — APTT
APTT: 55 s — AB (ref 24–36)
aPTT: 57 seconds — ABNORMAL HIGH (ref 24–36)

## 2016-04-13 MED ORDER — HALOPERIDOL LACTATE 2 MG/ML PO CONC
0.5000 mg | ORAL | Status: DC | PRN
  Filled 2016-04-13: qty 0.3

## 2016-04-13 MED ORDER — DEXTROSE 5 % IV SOLN
500.0000 mg | Freq: Once | INTRAVENOUS | Status: AC
  Administered 2016-04-13: 500 mg via INTRAVENOUS
  Filled 2016-04-13: qty 500

## 2016-04-13 MED ORDER — HALOPERIDOL 0.5 MG PO TABS
0.5000 mg | ORAL_TABLET | ORAL | Status: DC | PRN
  Filled 2016-04-13: qty 1

## 2016-04-13 MED ORDER — MIDAZOLAM HCL 5 MG/ML IJ SOLN
4.0000 mg/h | INTRAMUSCULAR | Status: DC
  Administered 2016-04-13 (×3): 4 mg/h via INTRAVENOUS
  Filled 2016-04-13: qty 10

## 2016-04-13 MED ORDER — MORPHINE BOLUS VIA INFUSION
5.0000 mg | INTRAVENOUS | Status: DC | PRN
  Administered 2016-04-13: 5 mg via INTRAVENOUS
  Filled 2016-04-13: qty 5

## 2016-04-13 MED ORDER — BIOTENE DRY MOUTH MT LIQD: 15.0000 mL | OROMUCOSAL | Status: DC | PRN

## 2016-04-13 MED ORDER — GLYCOPYRROLATE 0.2 MG/ML IJ SOLN
0.4000 mg | Freq: Four times a day (QID) | INTRAMUSCULAR | Status: DC
Start: 2016-04-13 — End: 2016-04-14
  Administered 2016-04-13: 0.4 mg via INTRAVENOUS
  Filled 2016-04-13: qty 2

## 2016-04-13 MED ORDER — GLYCOPYRROLATE 0.2 MG/ML IJ SOLN: 0.2000 mg | INTRAMUSCULAR | Status: DC | PRN

## 2016-04-13 MED ORDER — HALOPERIDOL LACTATE 5 MG/ML IJ SOLN: 0.5000 mg | INTRAMUSCULAR | Status: DC | PRN

## 2016-04-13 MED ORDER — GLYCOPYRROLATE 1 MG PO TABS: 1.0000 mg | ORAL_TABLET | ORAL | Status: DC | PRN

## 2016-04-13 MED ORDER — POLYVINYL ALCOHOL 1.4 % OP SOLN
1.0000 [drp] | Freq: Four times a day (QID) | OPHTHALMIC | Status: DC | PRN
  Filled 2016-04-13: qty 15

## 2016-04-13 MED ORDER — FUROSEMIDE 10 MG/ML IJ SOLN
60.0000 mg | Freq: Two times a day (BID) | INTRAMUSCULAR | Status: DC
  Administered 2016-04-13: 60 mg via INTRAVENOUS
  Filled 2016-04-13: qty 6

## 2016-04-13 MED ORDER — SODIUM CHLORIDE 0.9 % IV SOLN
1.0000 mg/h | INTRAVENOUS | Status: DC
  Administered 2016-04-13: 5 mg/h via INTRAVENOUS
  Administered 2016-04-13: 0.5 mg/h via INTRAVENOUS
  Administered 2016-04-13 (×2): 5 mg/h via INTRAVENOUS
  Administered 2016-04-13: 2 mg/h via INTRAVENOUS
  Filled 2016-04-13: qty 5
  Filled 2016-04-13: qty 10

## 2016-04-19 ENCOUNTER — Telehealth: Payer: Self-pay | Admitting: Adult Health

## 2016-04-19 ENCOUNTER — Telehealth: Payer: Self-pay | Admitting: Cardiology

## 2016-04-19 NOTE — Telephone Encounter (Signed)
Spoke with pt wife, sympathy given. Dr Jens Somcrenshaw made aware.

## 2016-04-19 NOTE — Telephone Encounter (Signed)
Is wife wanted Dr Jens Somrenshaw know that pt passed away last Tuesday. He thought the world of Dr Jens Somrenshaw and thank him for everything.

## 2016-04-19 NOTE — Telephone Encounter (Signed)
Called and left message.

## 2016-04-19 NOTE — Telephone Encounter (Signed)
Will forward to TP 

## 2016-04-22 ENCOUNTER — Telehealth: Payer: Self-pay | Admitting: Adult Health

## 2016-04-22 NOTE — Telephone Encounter (Signed)
This pt is deceased and whomever was trying to contact us no name was noted. Dialed number but went straight to vm and no name on machine left vm to call back

## 2016-04-25 NOTE — Consult Note (Signed)
Consultation Note Date: 04-30-16   Patient Name: [name]  DOB: 08-20-1934  MRN: [id number]  Age / Sex: 82 y.o., male  PCP: Hali Marry, MD Referring Physician: Verlee Monte, MD  Reason for Consultation: Establishing goals of care, Psychosocial/spiritual support, Terminal Care and Withdrawal of life-sustaining treatment  HPI/Patient Profile: 82 y.o. male  with past medical history of interstitial lung disease, AAA, AVR, PVD, afib who was admitted on 04/22/2016 with respiratory distress and acute on chronic respiratory failure.  He was admitted to stepdown and pulmonary was consulted.  He was treated aggressively with IV antibiotics, nebulizers, steroids, and bipap support.  The patient did not improve.  The [facility] attending physician held in depth discussions with the family.  As a result the patient was made DNR/DNI and opted for comfort care.  Clinical Assessment and Goals of Care: I met and examined the patient at bedside.  I then met in a conference room with his wife Jan, his 5 children, their spouses, and Theme park manager.  A long discussion was had about the patient who is called "Coral Else".  We discussed his life, his marriage and his kids.  Coral Else was always busy and independent.  Over the past four years that he has suffered with IPF his quality of life has decreased significantly.  Today after the conversation with Dr. Hartford Poli when the patient decided to accept comfort care a peaceful sense of relief came over him.  His entire family understands that his wish is to take off the bipap and peacefully slip away.  We discussed at length how this will happen.  One of his sons Francee Piccolo worked for [employer] and understands the process.  Another son also has in depth medical knowledge.  This enabled [place] to lay out a fairly specific plan for weaning him from the bipap and establishing comfort.  Jan participated in the  conversation and is very comfortable with the plan.  After our meeting I spoke with Shirlean Mylar, bedside RN.  She quickly understood and began making preparations for a wean at approximately 3 - 3:30 this afternoon.  The wean began at about 5:00 pm after all of the family arrived and had a chance to say good bye.  Mr. Joni, Nor[place], died peacefully at approximately 6:15 pm.   Primary Decision Maker:  PATIENT    SUMMARY OF RECOMMENDATIONS    Code Status/Advance Care Planning:  DNR    Symptom Management:   D/C all interventions/medications not related to comfort.  Begin morphine gtt with PRN boluses.  Will start versed gtt with PRN boluses after the family has had a chance to visit.  [place]in[place] for secretions.  If he survives overnight we will consider transfer to [facility] on 3/21  Additional Recommendations (Limitations, Scope, Preferences):  Full Comfort Care  Psycho-social/Spiritual:   Desire for further Chaplaincy support:  Family Pastor Coralyn Mark) is present.  Prognosis:   Hours - Days  Discharge Planning: Anticipated Hospital Death      Primary Diagnoses: Present on Admission: . Orthostatic hypotension .  Iron deficiency anemia . Persistent atrial fibrillation ([place]) . Interstitial pulmonary fibrosis ([place]) . AAA (abdominal aortic aneurysm) without rupture ([place]) . Thrombocytopenia ([place]) . Chronic diastolic CHF (congestive heart failure) ([place]) . Sepsis ([place]) . Community acquired pneumonia . Acute on chronic respiratory failure with hypoxia and hypercapnia (HCC)   I have reviewed the medical record, interviewed the patient and family, and examined the patient. The following aspects are pertinent.  Past Medical History:  Diagnosis Date  . AAA (abdominal aortic aneurysm) ([place])   . Aortic stenosis    AVR 2011  . Arthritis    "hands" (01/23/2016)  . Atrial fibrillation ([place])    "started in 09/2015" (01/23/2016)  . Cleft palate   . Dermatitis    poison ivy  .  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 GERD (gastroesophageal reflux disease)   . Heart murmur    "before AVR"  . History of hiatal hernia   . Hyperlipidemia   . Interstitial lung disease ([place])   . Iron (Fe) deficiency anemia    "had an infusion 07/2015" (01/23/2016)  . Iron deficiency anemia 02/02/2016  . 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 On home oxygen therapy    "6L; 24/7" (01/23/2016)  . Pulmonary fibrosis ([place])   . PVD (peripheral vascular disease) ([place]) 01/22/2016  . Syncope and collapse 07/18/2014   Social History   Social History  . Marital status: Married    Spouse name: N/A  . Number of children: N/A  . Years of education: N/A   Occupational History  . Retired Retired   Social History Main Topics  . Smoking status: Former Smoker    Packs/day: 1.50    Years: 5.00    Types: Cigarettes, Cigars    Quit date: 09/08/1977  . Smokeless tobacco: Never Used  . Alcohol use No  . Drug use: No  . Sexual activity: No   Other Topics Concern  . None   Social History Narrative  . None   Family History  Problem Relation Age of Onset  . Heart attack Mother   . Cancer Father     lung and stomach  . Heart attack Father   . Cancer Sister     bone  . Diabetes Sister   . Heart murmur Brother   . Hypertension Brother   . Hyperlipidemia Brother   . Heart attack Brother    Scheduled Meds: . glycopyrrolate  0.4 mg Intravenous QID  . sodium chloride flush  3 mL Intravenous Q12H   Continuous Infusions: . midazolam (VERSED) infusion    . morphine     PRN Meds:.acetaminophen **OR** acetaminophen, antiseptic oral rinse, glycopyrrolate **OR** glycopyrrolate **OR** glycopyrrolate, haloperidol **OR** haloperidol **OR** haloperidol lactate, morphine, ondansetron **OR** ondansetron (ZOFRAN) IV, polyvinyl alcohol Allergies  Allergen Reactions  . Pravastatin Sodium Other (See Comments)    Sneezing episodes, can take medication and tolerates reaction.  . Pirfenidone Nausea  And Vomiting   Review of Systems patient on bipap  Physical Exam  Well developed pleasant man.  Appears some what uncomfortable on bipap,  Awake, orientated, cooperative. CV irreg irreg, rapid Decreased breath sounds  Vital Signs: BP (!) 124/96 (BP Location: Right Arm)   Pulse (!) 115   Temp 98.3 F (36.8 C) (Oral)   Resp (!) 31   Ht 6' (1.829 m)   Wt 96.1 kg (211 lb 13.8 oz)   SpO2 97%   BMI 28.73 kg/m  Pain Assessment: No/denies pain POSS *See Group Information*: 1-Acceptable,Awake and alert Pain Score:  Asleep   SpO2: SpO2: 97 % O2 Device:SpO2: 97 % O2 Flow Rate: .O2 Flow Rate (L/min): 14 L/min  IO: Intake/output summary:  Intake/Output Summary (Last 24 hours) at May 13, 2016 1336 Last data filed at 05/13/2016 1318  Gross per 24 hour  Intake           423.83 ml  Output             2350 ml  Net         -1926.17 ml    LBM: Last BM Date: 04/03/2016 Baseline Weight: Weight: 93.5 kg (206 lb 2.1 oz) Most recent weight: Weight: 96.1 kg (211 lb 13.8 oz)     Palliative Assessment/Data:   Flowsheet Rows     Most Recent Value  Intake Tab  Referral Department  Hospitalist  Unit at Time of Referral  Intermediate Care Unit  Palliative Care Primary Diagnosis  Pulmonary  Date Notified  May 13, 2016  Palliative Care Type  New Palliative care  Reason for referral  Non-pain Symptom, Pain, Clarify Goals of Care, End of Life Care Assistance  Date of Admission  04/03/2016  Date first seen by Palliative Care  2016-05-13  # of days Palliative referral response time  0 Day(s)  # of days IP prior to Palliative referral  2  Clinical Assessment  Palliative Performance Scale Score  30%  Pain Max last 24 hours  8  Pain Min Last 24 hours  6  Anxiety Max Last 24 Hours  8  Anxiety Min Last 24 Hours  3  Psychosocial & Spiritual Assessment  Palliative Care Outcomes  Patient/Family meeting held?  Yes  Who was at the meeting?  patient, wife, 5 children, spouses, and grand children  Palliative Care  Outcomes  Improved pain interventions, Improved non-pain symptom therapy, Clarified goals of care, Provided end of life care assistance, Provided psychosocial or spiritual support, Changed to focus on comfort      Time In: 12:00 Time Out: 1:52 Time In:  3:30  Time Out:  5:00 Time Total: 3 hr 22 min Greater than 50%  of this time was spent counseling and coordinating care related to the above assessment and plan.  Signed by: Imogene Burn, PA-C Palliative Medicine Pager: [phone]  Please contact Palliative Medicine Team phone at [phone] for questions and concerns.  For individual provider: See Shea Evans

## 2016-04-25 NOTE — Progress Notes (Signed)
Name: [name] MRN: [id number] DOB: 03/02/1934    ADMISSION DATE:  04/15/2016 CONSULTATION DATE: 04/12/2016  REFERRING MD :  Delena Serve  CHIEF COMPLAINT:  Worsening dyspnea  BRIEF PATIENT DESCRIPTION:   SIGNIFICANT EVENTS:   Admit 04/20/2016 BIPAP 04/12/2016  STUDIES:  CXR 3/19>>worsening pulmonary edema superimposed pulmonary fibrosis with possible underlying infection CxR 3/20 NSC  HISTORY OF PRESENT ILLNESS:    [name] is a 82 y.o. male with a past medical history significant for IPF on 4-6L home O2 baseline, Afib on apixaban, AS s/p AVR, and CAD who presents with dyspnea  cough, fever for 1 day.Lactic acid 3.4 on admission, tachycardic, in a fib. CXR indicates bilateral opacities, and patient coughing up new bloody sputum. Influenza is negative. He was originally taken by ambulance to [facility] ED, and family requested transfer to [facility].He was admitted by [facility], treated with aggressive IV hydration, Rocephin and Zithromax. He has gotten progressively worse overnight requiring BIPAP this am. CCM was consulted .   SUBJECTIVE: Cannot come off bipap at this time.  VITAL SIGNS: Temp:  [97.6 F (36.4 C)-98.9 F (37.2 C)] 98.4 F (36.9 C) (03/20 0832) Pulse Rate:  [104-124] 120 (03/20 1100) Resp:  [20-30] 28 (03/20 1100) BP: (117-168)/(78-107) 151/96 (03/20 1100) SpO2:  [91 %-100 %] 100 % (03/20 1100) FiO2 (%):  [40 %-55 %] 50 % (03/20 1100) Weight:  [96.1 kg (211 lb 13.8 oz)] 96.1 kg (211 lb 13.8 oz) (03/20 0300)  PHYSICAL EXAMINATION: General:  Alert and appropriate on BIPAP, Following Commands, Sats 100% on 50% fio2, decreased to 40%. Talking about comfort care. Neuro: Alert and Oriented x 3, MAE x 4, follows commands, answering appropriately. HEENT:  Atraumatic, Normocephalic, BiPAP mask Cardiovascular: Tachycardia, atrial fibrillation,S1, S2, + Murmur, no rub.  Lungs: Rhonchi throughout, decreased air movement Abdomen: Soft, flat,  non-distended, BS + Musculoskeletal:  No Obvious deformities Skin:  Intact, no obvious bruising or tears   Recent Labs Lab 04/12/2016 0611 04/12/16 0229 17-Apr-2016 0200  NA 141 137 138  K 4.6 4.9 4.8  CL 101 100* 94*  CO2 31 30 31   BUN 23* 26* 27*  CREATININE 1.21 0.99 1.12  GLUCOSE 112* 100* 106*    Recent Labs Lab 04/20/2016 0611 04/12/16 0229 04/17/2016 0200  HGB 9.2* 9.0* 9.0*  HCT 29.0* 28.4* 28.2*  WBC 29.8* 31.0* 25.4*  PLT 80* 79* 112*   Dg Chest Port 1 View  Result Date: 2016/04/17 CLINICAL DATA:  Respiratory failure, history atrial fibrillation, pulmonary fibrosis, former smoker EXAM: PORTABLE CHEST 1 VIEW COMPARISON:  Portable exam 0607 hours compared 04/12/2016 FINDINGS: Enlargement of cardiac silhouette post median sternotomy and AVR. Tracheal deviation to the RIGHT unchanged. Air-filled esophagus. Diffuse BILATERAL pulmonary infiltrates unchanged. No gross pleural effusion or pneumothorax. Bones demineralized. IMPRESSION: Persistent severe diffuse BILATERAL pulmonary infiltrates with known background of pulmonary fibrosis; this could represent superimposed pulmonary edema or pneumonia. Electronically Signed   By: Ulyses Southward M.D.   On: 04-17-16 08:28   Dg Chest Port 1 View  Result Date: 04/12/2016 CLINICAL DATA:  SOB worse since yesterday EXAM: PORTABLE CHEST 1 VIEW COMPARISON:  04/10/2016; 01/05/2016; chest CT - 01/23/2016 FINDINGS: Grossly unchanged enlarged cardiac silhouette and mediastinal contours given persistently reduced lung volumes. There is persistent rightward tracheal deviation at the level of the thoracic inlet. Post median sternotomy and aortic valve replacement. The pulmonary vasculature remains indistinct with cephalization of flow. Suspected increase in size of trace bilateral effusions. Rather extensive coarsened interstitial  opacities appear grossly unchanged. Worsening bibasilar opacities, left greater than right. No pneumothorax. No acute osseus  abnormalities. IMPRESSION: Findings most suggestive of worsening pulmonary edema superimposed on background of pulmonary fibrosis, though note, underlying infection is not excluded. Electronically Signed   By: Simonne ComeJohn  Watts M.D.   On: 04/12/2016 08:38   Dg Chest Port 1 View  Result Date: 04/22/2016 CLINICAL DATA:  Pneumonia with sepsis.  Shortness of breath. EXAM: PORTABLE CHEST 1 VIEW COMPARISON:  01/05/2016 FINDINGS: Previous median sternotomy and aortic valve replacement. Chronic cardiomegaly. Background pattern of chronic pulmonary fibrosis. Pulmonary density diffusely increased above that, which could be due to a combination of edema, pneumonia and ARDS. No evidence of large effusion. IMPRESSION: Background pulmonary fibrosis. Diffuse lung density above that consistent with edema, pneumonia, ARDS. Electronically Signed   By: Paulina FusiMark  Shogry M.D.   On: 04/15/2016 14:15    Intake/Output Summary (Last 24 hours) at 24-Mar-2016 1125 Last data filed at 24-Mar-2016 1100  Gross per 24 hour  Intake           390.48 ml  Output             2150 ml  Net         -1759.52 ml     ASSESSMENT / PLAN:  Multi-factorial Acute on Chronic Respiratory Failure 2/2 CHF, IPF, Suspected pneumonia A:  Dyspnea NSC despite negative I/O CXR with Pulmonary edema and ? Infiltrate per Right side. Home oxygen 4-6 L ( 24/7 per wife) ABG improved after BiPAP and lasix  Plan: CXR 3/20 looks worse Lasix as ordered per Primary Oxygen/ BiPap to maintain oxygen saturations 88-92% ( BIPAP prn) cannot come Repeat ABG after lasix and BiPAP trial, marginal improvement 3/20. Family discussed possible palliative route. Meet set at 1230 3/20 ABX per [facility]  Sepsis A: Hemodynamically stable at present Pneumonia( CAP) per CXR ( ? Source) Afebrile  Plan: Trend CBC/ WBC and Lactate  CXR 3/20, worse Continue Rocephin and Zithromax. Continued Schedule BD, Mucolytics, oxygen therapy  Pulmonary Edema/ Acute on Chronic diastolic CHF    Elevated Troponin( Suspect demand ischemia, but recent CP per wife) Continued Tachycardia  A: Vascular congestion per CXR Worsening Dyspnea Plan: Minimize IVF Trend BNP/ Troponin Echo ( Last EF 50-55% 01/2015)  IPF A:  Underlying disease  Wears home oxygen ar 4-6 L 24/7 Plan: Echo to evaluate Pulmonary Artery Pressures Titrate Oxygen/BIPAP to maintain saturations 88-92%, decreased to 40% 3/20 Goal is return to home baseline oxygen 4-6 L  Continue prednisone 50 mg daily per [facility] ( Home dose is 10 mg daily) Resolve acute etiologies ( CHF/Pulmonary Edema/ Pneumonia) Pt and family are speaking to Palliative care about transitioning to comfort care.  Brett CanalesSteve Minor ACNP [facility]ackLe [facility] PCCM Pager [phone] till 3 pm If no answer page [phone] 2016-10-08, 11:22 AM  Attending Note:  I have examined patient, reviewed labs, studies and notes. I have discussed the case with S Minor, and I agree with the data and plans as amended above.   Progressive ILD, has been dependent on BiPAP. Has been seen by palliative care and decision made to withdraw BiPAP, focus on his comfort. I fully support this plan, appreciate Palliative care's assistance.    Levy Pupaobert Hommer Cunliffe, MD, PhD 2016-10-08, 5:07 PM [facility] [phone] or if no answer [phone]

## 2016-04-25 NOTE — Progress Notes (Signed)
Wasted 10ml versed and 175ml of morphine with Teresa CoombsBetty Jo RN. Marisue Ivanobyn Muhammed Teutsch RN

## 2016-04-25 NOTE — Progress Notes (Signed)
ANTICOAGULATION CONSULT NOTE - Initial Consult  Pharmacy Consult for Heparin  Indication: chest pain/ACS  Allergies  Allergen Reactions  . Pravastatin Sodium Other (See Comments)    Sneezing episodes, can take medication and tolerates reaction.  . Pirfenidone Nausea And Vomiting    Patient Measurements: Height: 6' (182.9 cm) Weight: 211 lb 13.8 oz (96.1 kg) IBW/kg (Calculated) : 77.6 Heparin Dosing Weight: 93.5 kg  Vital Signs: Temp: 98.3 F (36.8 C) (03/20 1142) Temp Source: Oral (03/20 1142) BP: 151/96 (03/20 1100) Pulse Rate: 115 (03/20 1145)  Labs:  Recent Labs  04/12/2016 0611  03/25/2016 1211 03/30/2016 1746 04/01/2016 2317 04/12/16 0229 04/12/16 1105 06-30-2016 0200 06-30-2016 1116  HGB 9.2*  --   --   --   --  9.0*  --  9.0*  --   HCT 29.0*  --   --   --   --  28.4*  --  28.2*  --   PLT 80*  --   --   --   --  79*  --  112*  --   APTT  --   < > 39*  --   --   --  90* 57* 55*  LABPROT  --   --  18.4*  --   --   --   --   --   --   INR  --   --  1.51  --   --   --   --   --   --   HEPARINUNFRC  --   --  >2.20*  --   --   --  2.06* 1.32*  --   CREATININE 1.21  --   --   --   --  0.99  --  1.12  --   TROPONINI  --   < > 0.28* 0.23* 0.19*  --   --   --   --   < > = values in this interval not displayed.  Estimated Creatinine Clearance: 64.3 mL/min (by C-G formula based on SCr of 1.12 mg/dL).  Assessment: Patient is a [age] yom admitted w/ cough on apixaban PTA for a fib. Pharmacy was consulted to switch patient to heparin in setting of ACS/STEMI. Patient's last dose of apixaban was 3/18 at around 10:00AM.  Anticoag: Heparin for ACS/STEMI, holding PTA apixaban for a fib currently.  HL 1.32 this am Aptt 55 on 1450 units/hr  Renal: SCr 1.12  Heme/Onc: H&H 9/28.2, Plt 112  Goal of Therapy:  Heparin level 0.3-0.7 units/ml Monitor platelets by anticoagulation protocol: Yes   Plan:  Increase heparin to 1600 units/hr  2100 aptt Daily aPTT, HL, and CBC  Monitor for  s/sx of bleeding F/U cards plan  Isaac BlissMichael Ryann Pauli, PharmD, BCPS, BCCCP Clinical Pharmacist Clinical phone for September 15, 2016 from 7a-3:30p: [phone] If after 3:30p, please call main pharmacy at: [phone] September 15, 2016 12:36 PM

## 2016-04-25 NOTE — Progress Notes (Addendum)
Pt continues to cough up bright red sputum.  Sats noted to be 88-90% on 40% BiPAP, increased to 50%, Morphine 2 mg IV given.  Sats up to 95%.  Will continue to monitor.  Pt. Does not tolerate being off bipap for even a minute, pt gets SOB and anxious.  Sats drop to the high 70's-low 80's.

## 2016-04-25 NOTE — Discharge Summary (Signed)
Death Summary  [name]:[id number] DOB: 1934-05-06 DOA: 2016-04-17  PCP: Roy Gasser, MD  Admit date: April 17, 2016 Date of Death: Apr 19, 2016 Time of Death: Jun 08, 1813 Notification: METHENEY,CATHERINE, MD notified of death of 19-Apr-2016   History of present illness:  [name] is a 82 y.o. male with a history of IPF on 4-6L home O2 baseline, Afib on apixaban, AS s/p bioprosthetic AVR, and CADwho presents with cough, fever for 1 day PTA. The patient was completely in his normal state of health until this morning when he developed a dry cough. His wife noticed hiscoughing got worse and worse over the course of the day, and this evening when he came to bed, she noticed that he was huffing and puffing, seem to be in distress, she checked his oxygen and was 85%, and he asked herto call 9-1-1. There has been no preceding illness, no recent leg swelling, orthopnea or noct dyspnea.   He ws admitted to SDU, he was not improving with antibiotics and diuretics, he required BiPAP and PCCM was consulted, in the AM of 19-Apr-2016 he wanted to talk about comfort measures and felt its his time to meet his lord. Family was at the room including his wife and his 5 kids. Code status was switched to DNR/DNI and Palliative care consulted. Mrs [name] of palliative care conducted a meeting with the family and patient, wife and his kids all agreed for comfort measures. The BiPAP discontinued placed on morphine drip and help with benzos for anxiety, he expired at 1815 surrounded by his loving family and spouse.    Final Diagnoses:  1.   Acute on chronic Respiratory failure. Principal Problem:   Sepsis (HCC) Active Problems:   Persistent atrial fibrillation (HCC)   S/P AVR (aortic valve replacement)   Orthostatic hypotension   Interstitial pulmonary fibrosis (HCC)   AAA (abdominal aortic aneurysm) without rupture (HCC)   Thrombocytopenia (HCC)   Iron deficiency anemia   Chronic diastolic CHF  (congestive heart failure) (HCC)   Community acquired pneumonia   Acute on chronic respiratory failure with hypoxia and hypercapnia (HCC)   Palliative care encounter   Terminal care   The results of significant diagnostics from this hospitalization (including imaging, microbiology, ancillary and laboratory) are listed below for reference.    Significant Diagnostic Studies: Dg Chest Port 1 View  Result Date: 19-Apr-2016 CLINICAL DATA:  Respiratory failure, history atrial fibrillation, pulmonary fibrosis, former smoker EXAM: PORTABLE CHEST 1 VIEW COMPARISON:  Portable exam 0607 hours compared 04/12/2016 FINDINGS: Enlargement of cardiac silhouette post median sternotomy and AVR. Tracheal deviation to the RIGHT unchanged. Air-filled esophagus. Diffuse BILATERAL pulmonary infiltrates unchanged. No gross pleural effusion or pneumothorax. Bones demineralized. IMPRESSION: Persistent severe diffuse BILATERAL pulmonary infiltrates with known background of pulmonary fibrosis; this could represent superimposed pulmonary edema or pneumonia. Electronically Signed   By: Ulyses Southward M.D.   On: 04/19/16 08:28   Dg Chest Port 1 View  Result Date: 04/12/2016 CLINICAL DATA:  SOB worse since yesterday EXAM: PORTABLE CHEST 1 VIEW COMPARISON:  2016/04/17; 01/05/2016; chest CT - 01/23/2016 FINDINGS: Grossly unchanged enlarged cardiac silhouette and mediastinal contours given persistently reduced lung volumes. There is persistent rightward tracheal deviation at the level of the thoracic inlet. Post median sternotomy and aortic valve replacement. The pulmonary vasculature remains indistinct with cephalization of flow. Suspected increase in size of trace bilateral effusions. Rather extensive coarsened interstitial opacities appear grossly unchanged. Worsening bibasilar opacities, left greater than right. No pneumothorax. No acute osseus abnormalities. IMPRESSION:  Findings most suggestive of worsening pulmonary edema  superimposed on background of pulmonary fibrosis, though note, underlying infection is not excluded. Electronically Signed   By: Simonne Come M.D.   On: 04/12/2016 08:38   Dg Chest Port 1 View  Result Date: 04-28-16 CLINICAL DATA:  Pneumonia with sepsis.  Shortness of breath. EXAM: PORTABLE CHEST 1 VIEW COMPARISON:  01/05/2016 FINDINGS: Previous median sternotomy and aortic valve replacement. Chronic cardiomegaly. Background pattern of chronic pulmonary fibrosis. Pulmonary density diffusely increased above that, which could be due to a combination of edema, pneumonia and ARDS. No evidence of large effusion. IMPRESSION: Background pulmonary fibrosis. Diffuse lung density above that consistent with edema, pneumonia, ARDS. Electronically Signed   By: Paulina Fusi M.D.   On: April 28, 2016 14:15    Microbiology: Recent Results (from the past 240 hour(s))  MRSA PCR Screening     Status: None   Collection Time: 04/28/16  6:46 AM  Result Value Ref Range Status   MRSA by PCR NEGATIVE NEGATIVE Final    Comment:        The GeneXpert MRSA Assay (FDA approved for NASAL specimens only), is one component of a comprehensive MRSA colonization surveillance program. It is not intended to diagnose MRSA infection nor to guide or monitor treatment for MRSA infections.   Culture, sputum-assessment     Status: None   Collection Time: 04/28/2016  7:19 AM  Result Value Ref Range Status   Specimen Description EXPECTORATED SPUTUM  Final   Special Requests NONE  Final   Sputum evaluation THIS SPECIMEN IS ACCEPTABLE FOR SPUTUM CULTURE  Final   Report Status 04-28-2016 FINAL  Final  Culture, respiratory (NON-Expectorated)     Status: None   Collection Time: 04-28-16  7:19 AM  Result Value Ref Range Status   Specimen Description EXPECTORATED SPUTUM  Final   Special Requests NONE Reflexed from [id number]  Final   Gram Stain   Final    ABUNDANT WBC PRESENT, PREDOMINANTLY PMN RARE SQUAMOUS EPITHELIAL CELLS PRESENT FEW  GRAM POSITIVE COCCI IN PAIRS RARE GRAM POSITIVE RODS    Culture Consistent with normal respiratory flora.  Final   Report Status 04/08/2016 FINAL  Final     Labs: Basic Metabolic Panel:  Recent Labs Lab 04/28/16 0611 04/12/16 0229 03/28/2016 0200  NA 141 137 138  K 4.6 4.9 4.8  CL 101 100* 94*  CO2 31 30 31   GLUCOSE 112* 100* 106*  BUN 23* 26* 27*  CREATININE 1.21 0.99 1.12  CALCIUM 8.3* 8.5* 8.7*   Liver Function Tests:  Recent Labs Lab 04-28-16 0611  AST 32  ALT 29  ALKPHOS 51  BILITOT 1.2  PROT 5.6*  ALBUMIN 3.3*   No results for input(s): LIPASE, AMYLASE in the last 168 hours. No results for input(s): AMMONIA in the last 168 hours. CBC:  Recent Labs Lab 28-Apr-2016 0611 04/12/16 0229 04/15/2016 0200  WBC 29.8* 31.0* 25.4*  NEUTROABS 27.1*  --   --   HGB 9.2* 9.0* 9.0*  HCT 29.0* 28.4* 28.2*  MCV 85.0 85.3 84.4  PLT 80* 79* 112*   Cardiac Enzymes:  Recent Labs Lab 28-Apr-2016 0950 04-28-2016 1211 04/28/16 1746 April 28, 2016 2317  TROPONINI 0.28* 0.28* 0.23* 0.19*   D-Dimer No results for input(s): DDIMER in the last 72 hours. BNP: Invalid input(s): POCBNP CBG: No results for input(s): GLUCAP in the last 168 hours. Anemia work up No results for input(s): VITAMINB12, FOLATE, FERRITIN, TIBC, IRON, RETICCTPCT in the last 72 hours. Urinalysis  Component Value Date/Time   COLORURINE YELLOW 01/23/2016 0109   APPEARANCEUR CLEAR 01/23/2016 0109   LABSPEC 1.015 01/23/2016 0109   PHURINE 5.0 01/23/2016 0109   GLUCOSEU NEGATIVE 01/23/2016 0109   HGBUR NEGATIVE 01/23/2016 0109   HGBUR 2+ 11/14/2010 0926   BILIRUBINUR NEGATIVE 01/23/2016 0109   BILIRUBINUR negative 09/09/2015 1043   KETONESUR NEGATIVE 01/23/2016 0109   PROTEINUR NEGATIVE 01/23/2016 0109   UROBILINOGEN 1.0 09/09/2015 1043   UROBILINOGEN 4 (H) 06/11/2014 1626   NITRITE NEGATIVE 01/23/2016 0109   LEUKOCYTESUR NEGATIVE 01/23/2016 0109   Sepsis Labs Invalid input(s): PROCALCITONIN,  WBC,   LACTICIDVEN     SIGNED:  Clint LippsELMAHI,Prescilla Monger A, MD  [facility] 09/29/2016, 6:46 PM Pager   If 7PM-7AM, please contact night-coverage [email or web address] Password [email or web address]

## 2016-04-25 NOTE — Progress Notes (Signed)
PROGRESS NOTE  [name]  WUJ:[id number] DOB: Jul 14, 1934 DOA: 22-Mar-2016 PCP: Nani GasserMETHENEY,CATHERINE, MD Outpatient Specialists:  Subjective: Seen with his his wife at bedside, reported hemoptysis overnight, patient also reports he's feeling tired. Discussed with wife in the presence of other family members at bedside switched to DNR/DNI. Patient's reportedly felt tired of all this, palliative and hospice medicine team to evaluate. Likely will be for full comfort.  Brief Narrative:  Roy BundeRobert G Hasley is a 82 y.o. male with a past medical history significant for IPF on 4-6L home O2 baseline, Afib on apixaban, AS s/p bioprosthetic AVR, and CAD who presents with cough, fever for 1 day.  The patient was completely in his normal state of health until this morning when he developed a dry cough. His wife noticed his coughing got worse and worse over the course of the day, and this evening when he came to bed, she noticed that he was huffing and puffing, seem to be in distress, she checked his oxygen and was 85%, and he asked her to call 9-1-1.  There has been no preceding illness, no recent leg swelling, orthopnea or noct dyspnea.    Assessment & Plan:   Principal Problem:   Sepsis (HCC) Active Problems:   Persistent atrial fibrillation (HCC)   S/P AVR (aortic valve replacement)   Orthostatic hypotension   Interstitial pulmonary fibrosis (HCC)   AAA (abdominal aortic aneurysm) without rupture (HCC)   Thrombocytopenia (HCC)   Iron deficiency anemia   Chronic diastolic CHF (congestive heart failure) (HCC)   Community acquired pneumonia   Acute on chronic respiratory failure with hypoxia and hypercapnia (HCC)   Sepsis -Suspected source pneumonia. Organism unknown.  -Present on admission with a heart rate of 112, WBC of 29.8 and presence of pneumonia. Lactate was 3.4. -Treated with aggressive hydration with IV fluids and antibiotics.  Pulmonary edema, acute -CXR showed interstitial  edema consistent with acute pulmonary edema on top of his IPF. -Patient was given IV fluids overnight, discontinue and start on Lasix. -Continues to be on BiPAP today, palliative to see later today likely will be full comfort, CODE STATUS changed to DNR/DNI.  Community acquired pneumonia -Presented with cough, shortness of breath and hypoxia, CXR (from OSH) showed pneumonia. -Repeat chest x-ray, continue Rocephin and Zithromax. -Continue supportive management with bronchodilators, mucolytics and oxygen as needed, stop IV fluids.  Acute on chronic respiratory failure with hypoxia -Patient is on 4-6 L of oxygen at home, oxygen saturation reportedly was in the 50s on 6 L on presentation. -Developed labored breathing, chest x-ray worse with more fluids. -Started on diuresis, PCCM consulted.  Acute on chronic diastolic CHF  -2-D echo in January 2017 showed LVEF of 50% with high filling parameters. -Was on Lasix at home this is held on admission.IV fluid given because of sepsis. -He looks more fluid overloaded, CXR showed more interstitial edema. -IV fluids discontinued, started on Lasix and BiPAP  Elevated troponin  -This is likely secondary to acute on chronic diastolic CHF. -Unlikely to be secondary to ACS, likely demand ischemia from tachycardia and CHF.    Idiopathic pulmonary fibrosis:  -Continue home O2 -Prednisone 50 mg for 3 days, back to normal 10 mg on 3/21, -If develop low blood pressure again, I will definitely start stress dose of steroid on him.  Atrial fibrillation, now in RVR:  -CHADS2Vasc 4.  On apixaban, no rate control at baseline.  En route to [facility], BP dropped and diltiazem was stopped, but HR has remained 100-110  here. -Eliquis held started on heparin drip.  Iron deficiency anemia:  At baseline  Orthostatic hypotension:  -Hold Midodrine for now  Other medications:  -Continue finasteride -Continue PPI   DVT prophylaxis:  Code Status: DNR Family  Communication:  Disposition Plan:  Diet: Diet Heart Room service appropriate? Yes; Fluid consistency: Thin  Consultants:   None  Procedures:   None  Antimicrobials:   Rocephin and Zithromax   Objective: Vitals:   04/18/2016 0500 04/24/2016 0600 03/26/2016 0832 03/25/2016 0841  BP: (!) 168/93 (!) 149/101 (!) 155/95   Pulse: (!) 116 (!) 124 (!) 112   Resp:   (!) 28   Temp:   98.4 F (36.9 C)   TempSrc:   Oral   SpO2: 91% 98% 100% 96%  Weight:      Height:        Intake/Output Summary (Last 24 hours) at 04/01/2016 1053 Last data filed at 04/04/2016 1025  Gross per 24 hour  Intake           207.98 ml  Output             2150 ml  Net         -1942.02 ml   Filed Weights   05/05/16 0516 04/12/16 0402 04/15/2016 0300  Weight: 93.5 kg (206 lb 2.1 oz) 94.8 kg (208 lb 15.9 oz) 96.1 kg (211 lb 13.8 oz)    Examination: General exam: Appears calm and comfortable  Respiratory system: Clear to auscultation. Respiratory effort normal. Cardiovascular system: S1 & S2 heard, RRR. No JVD, murmurs, rubs, gallops or clicks. No pedal edema. Gastrointestinal system: Abdomen is nondistended, soft and nontender. No organomegaly or masses felt. Normal bowel sounds heard. Central nervous system: Alert and oriented. No focal neurological deficits. Extremities: Symmetric 5 x 5 power. Skin: No rashes, lesions or ulcers Psychiatry: Judgement and insight appear normal. Mood & affect appropriate.   Data Reviewed: I have personally reviewed following labs and imaging studies  CBC:  Recent Labs Lab 05/05/16 0611 04/12/16 0229 04/03/2016 0200  WBC 29.8* 31.0* 25.4*  NEUTROABS 27.1*  --   --   HGB 9.2* 9.0* 9.0*  HCT 29.0* 28.4* 28.2*  MCV 85.0 85.3 84.4  PLT 80* 79* 112*   Basic Metabolic Panel:  Recent Labs Lab 05/05/2016 0611 04/12/16 0229 04/11/2016 0200  NA 141 137 138  K 4.6 4.9 4.8  CL 101 100* 94*  CO2 31 30 31   GLUCOSE 112* 100* 106*  BUN 23* 26* 27*  CREATININE 1.21 0.99 1.12    CALCIUM 8.3* 8.5* 8.7*   GFR: Estimated Creatinine Clearance: 64.3 mL/min (by C-G formula based on SCr of 1.12 mg/dL). Liver Function Tests:  Recent Labs Lab 05-05-16 0611  AST 32  ALT 29  ALKPHOS 51  BILITOT 1.2  PROT 5.6*  ALBUMIN 3.3*   No results for input(s): LIPASE, AMYLASE in the last 168 hours. No results for input(s): AMMONIA in the last 168 hours. Coagulation Profile:  Recent Labs Lab 2016/05/05 1211  INR 1.51   Cardiac Enzymes:  Recent Labs Lab 05/05/2016 0950 May 05, 2016 1211 May 05, 2016 1746 05-May-2016 2317  TROPONINI 0.28* 0.28* 0.23* 0.19*   BNP (last 3 results)  Recent Labs  06/17/15 1017  PROBNP 210.0*   HbA1C: No results for input(s): HGBA1C in the last 72 hours. CBG: No results for input(s): GLUCAP in the last 168 hours. Lipid Profile: No results for input(s): CHOL, HDL, LDLCALC, TRIG, CHOLHDL, LDLDIRECT in the last 72 hours. Thyroid Function Tests:  No results for input(s): TSH, T4TOTAL, FREET4, T3FREE, THYROIDAB in the last 72 hours. Anemia Panel: No results for input(s): VITAMINB12, FOLATE, FERRITIN, TIBC, IRON, RETICCTPCT in the last 72 hours. Urine analysis:    Component Value Date/Time   COLORURINE YELLOW 01/23/2016 0109   APPEARANCEUR CLEAR 01/23/2016 0109   LABSPEC 1.015 01/23/2016 0109   PHURINE 5.0 01/23/2016 0109   GLUCOSEU NEGATIVE 01/23/2016 0109   HGBUR NEGATIVE 01/23/2016 0109   HGBUR 2+ 11/14/2010 0926   BILIRUBINUR NEGATIVE 01/23/2016 0109   BILIRUBINUR negative 09/09/2015 1043   KETONESUR NEGATIVE 01/23/2016 0109   PROTEINUR NEGATIVE 01/23/2016 0109   UROBILINOGEN 1.0 09/09/2015 1043   UROBILINOGEN 4 (H) 06/11/2014 1626   NITRITE NEGATIVE 01/23/2016 0109   LEUKOCYTESUR NEGATIVE 01/23/2016 0109   Sepsis Labs: @LABRCNTIP (procalcitonin:4,lacticidven:4)  ) Recent Results (from the past 240 hour(s))  MRSA PCR Screening     Status: None   Collection Time: 04/05/2016  6:46 AM  Result Value Ref Range Status   MRSA by PCR  NEGATIVE NEGATIVE Final    Comment:        The GeneXpert MRSA Assay (FDA approved for NASAL specimens only), is one component of a comprehensive MRSA colonization surveillance program. It is not intended to diagnose MRSA infection nor to guide or monitor treatment for MRSA infections.   Culture, sputum-assessment     Status: None   Collection Time: 04/19/2016  7:19 AM  Result Value Ref Range Status   Specimen Description EXPECTORATED SPUTUM  Final   Special Requests NONE  Final   Sputum evaluation THIS SPECIMEN IS ACCEPTABLE FOR SPUTUM CULTURE  Final   Report Status 04/02/2016 FINAL  Final  Culture, respiratory (NON-Expectorated)     Status: None   Collection Time: 03/27/2016  7:19 AM  Result Value Ref Range Status   Specimen Description EXPECTORATED SPUTUM  Final   Special Requests NONE Reflexed from [id number]  Final   Gram Stain   Final    ABUNDANT WBC PRESENT, PREDOMINANTLY PMN RARE SQUAMOUS EPITHELIAL CELLS PRESENT FEW GRAM POSITIVE COCCI IN PAIRS RARE GRAM POSITIVE RODS    Culture Consistent with normal respiratory flora.  Final   Report Status 05-04-2016 FINAL  Final     Invalid input(s): PROCALCITONIN, LACTICACIDVEN   Radiology Studies: Dg Chest Port 1 View  Result Date: 2016/05/04 CLINICAL DATA:  Respiratory failure, history atrial fibrillation, pulmonary fibrosis, former smoker EXAM: PORTABLE CHEST 1 VIEW COMPARISON:  Portable exam 0607 hours compared 04/12/2016 FINDINGS: Enlargement of cardiac silhouette post median sternotomy and AVR. Tracheal deviation to the RIGHT unchanged. Air-filled esophagus. Diffuse BILATERAL pulmonary infiltrates unchanged. No gross pleural effusion or pneumothorax. Bones demineralized. IMPRESSION: Persistent severe diffuse BILATERAL pulmonary infiltrates with known background of pulmonary fibrosis; this could represent superimposed pulmonary edema or pneumonia. Electronically Signed   By: Ulyses Southward M.D.   On: 05/04/16 08:28   Dg Chest Port 1  View  Result Date: 04/12/2016 CLINICAL DATA:  SOB worse since yesterday EXAM: PORTABLE CHEST 1 VIEW COMPARISON:  04/15/2016; 01/05/2016; chest CT - 01/23/2016 FINDINGS: Grossly unchanged enlarged cardiac silhouette and mediastinal contours given persistently reduced lung volumes. There is persistent rightward tracheal deviation at the level of the thoracic inlet. Post median sternotomy and aortic valve replacement. The pulmonary vasculature remains indistinct with cephalization of flow. Suspected increase in size of trace bilateral effusions. Rather extensive coarsened interstitial opacities appear grossly unchanged. Worsening bibasilar opacities, left greater than right. No pneumothorax. No acute osseus abnormalities. IMPRESSION: Findings most suggestive of worsening pulmonary edema  superimposed on background of pulmonary fibrosis, though note, underlying infection is not excluded. Electronically Signed   By: Simonne Come M.D.   On: 04/12/2016 08:38   Dg Chest Port 1 View  Result Date: 04-May-2016 CLINICAL DATA:  Pneumonia with sepsis.  Shortness of breath. EXAM: PORTABLE CHEST 1 VIEW COMPARISON:  01/05/2016 FINDINGS: Previous median sternotomy and aortic valve replacement. Chronic cardiomegaly. Background pattern of chronic pulmonary fibrosis. Pulmonary density diffusely increased above that, which could be due to a combination of edema, pneumonia and ARDS. No evidence of large effusion. IMPRESSION: Background pulmonary fibrosis. Diffuse lung density above that consistent with edema, pneumonia, ARDS. Electronically Signed   By: Paulina Fusi M.D.   On: May 04, 2016 14:15        Scheduled Meds: . aspirin EC  81 mg Oral Daily  . azithromycin  500 mg Oral Q24H  . cefTRIAXone (ROCEPHIN)  IV  1 g Intravenous Q24H  . finasteride  5 mg Oral Daily  . furosemide  60 mg Intravenous BID  . hydrocortisone sodium succinate  50 mg Intravenous Q6H  . levalbuterol  0.63 mg Nebulization Q6H  . pantoprazole  40 mg  Oral BID AC  . pravastatin  40 mg Oral QHS  . sodium chloride flush  3 mL Intravenous Q12H   Continuous Infusions: . heparin 1,450 Units/hr (04/06/2016 0441)     LOS: 2 days    Time spent: 35 minutes    Correna Meacham A, MD [facility] Pager [phone]  If 7PM-7AM, please contact night-coverage [email or web address] Password [facility] 03/25/2016, 10:53 AM

## 2016-04-25 NOTE — Progress Notes (Signed)
ANTICOAGULATION CONSULT NOTE  Pharmacy Consult for Heparin  Indication: chest pain/ACS  Allergies  Allergen Reactions  . Pravastatin Sodium Other (See Comments)    Sneezing episodes, can take medication and tolerates reaction.  . Pirfenidone Nausea And Vomiting    Patient Measurements: Height: 6' (182.9 cm) Weight: 211 lb 13.8 oz (96.1 kg) IBW/kg (Calculated) : 77.6 Heparin Dosing Weight: 93.5 kg  Vital Signs: Temp: 97.6 F (36.4 C) (03/20 0300) Temp Source: Oral (03/20 0300) BP: 125/86 (03/20 0400) Pulse Rate: 114 (03/20 0400)  Labs:  Recent Labs  04/19/2016 0611  04/07/2016 1211 04/06/2016 1746 03/27/2016 2317 04/12/16 0229 04/12/16 1105 February 02, 2016 0200  HGB 9.2*  --   --   --   --  9.0*  --  9.0*  HCT 29.0*  --   --   --   --  28.4*  --  28.2*  PLT 80*  --   --   --   --  79*  --  PENDING  APTT  --   --  39*  --   --   --  90* 57*  LABPROT  --   --  18.4*  --   --   --   --   --   INR  --   --  1.51  --   --   --   --   --   HEPARINUNFRC  --   --  >2.20*  --   --   --  2.06* 1.32*  CREATININE 1.21  --   --   --   --  0.99  --  1.12  TROPONINI  --   < > 0.28* 0.23* 0.19*  --   --   --   < > = values in this interval not displayed.  Estimated Creatinine Clearance: 64.3 mL/min (by C-G formula based on SCr of 1.12 mg/dL).  Assessment: 82 y.o. male with chest pain/ACS, h/o Afib and Eliquis on hold, for heparin  Goal of Therapy:  APTT 66-102 while Eliquis affecting anti-Xa level  Heparin level 0.3-0.7 units/ml Monitor platelets by anticoagulation protocol: Yes   Plan:  Increase Heparin  1450 units/hr APTT in 8 hours  Geannie RisenGreg Birch Farino, PharmD, BCPS August 12, 2016 4:33 AM

## 2016-04-25 NOTE — Progress Notes (Signed)
Morphine increased to 8mg  and versed started at 4mg  per palliative PA. Marisue Ivanobyn Lorretta Kerce  RN

## 2016-04-25 NOTE — Progress Notes (Signed)
Pt passed at 1815 with family and palliative NP at bedside. Notified Dr Arthor CaptainElmahi. Marisue Ivanobyn Ares Cardozo RN

## 2016-04-25 NOTE — Progress Notes (Signed)
Morphine gtt started at 0.5mg  , family waiting for one more family member to arrive before turning morphine gtt up and starting versed gtt to say goodbye to patient. Comfort care cart ordered. Rob

## 2016-04-25 NOTE — Care Management Note (Signed)
Case Management Note  Patient Details  Name: Roy BundeRobert G Mcraney MRN: [id number] Date of Birth: 1934/07/14  Subjective/Objective:   From home with wife, presents with IPF, hypotension, pafib, AAA, thrombocytopenia, chf, sepsis, acute resp failure will dc all interventions/ medications, begin morphine drip, versed drip, robinul for secretions for comfort care.                  Action/Plan:   Expected Discharge Date:                  Expected Discharge Plan:     In-House Referral:  Hospice / Palliative Care  Discharge planning Services  CM Consult  Post Acute Care Choice:    Choice offered to:     DME Arranged:    DME Agency:     HH Arranged:    HH Agency:     Status of Service:  In process, will continue to follow  If discussed at Long Length of Stay Meetings, dates discussed:    Additional Comments:  Leone Havenaylor, Latrease Kunde [place], RN 04/16/2016, 4:01 PM

## 2016-04-25 DEATH — deceased

## 2016-04-27 NOTE — Telephone Encounter (Signed)
LMTCB

## 2016-04-28 NOTE — Telephone Encounter (Signed)
LM x3  

## 2016-04-28 NOTE — Telephone Encounter (Signed)
Will forward to TP 

## 2016-04-28 NOTE — Telephone Encounter (Signed)
Wife (Jan) called wanting to talk with TP because of patient being deceased, and would like to speak with TP personally.Benay Pillow # [phone].Charm Rings

## 2016-05-03 NOTE — Telephone Encounter (Signed)
TP please advise if you have spoken with the pts wife and if we may close this phone note.  thanks

## 2016-05-03 NOTE — Telephone Encounter (Signed)
Spoke with wife, support provided.

## 2016-05-07 ENCOUNTER — Other Ambulatory Visit: Payer: Self-pay | Admitting: Nurse Practitioner

## 2016-05-10 ENCOUNTER — Ambulatory Visit: Payer: Medicare Other | Admitting: Internal Medicine

## 2016-05-11 ENCOUNTER — Encounter (HOSPITAL_COMMUNITY): Payer: Medicare Other

## 2016-05-11 ENCOUNTER — Ambulatory Visit: Payer: Medicare Other | Admitting: Family

## 2016-06-04 ENCOUNTER — Ambulatory Visit: Payer: Medicare Other | Admitting: Hematology & Oncology

## 2016-06-04 ENCOUNTER — Other Ambulatory Visit: Payer: Medicare Other

## 2016-07-16 ENCOUNTER — Ambulatory Visit: Payer: Medicare Other | Admitting: Family Medicine

## 2016-07-23 ENCOUNTER — Ambulatory Visit: Payer: Medicare Other | Admitting: Family Medicine

## 2017-12-09 IMAGING — CR DG SHOULDER 2+V*L*
3 series · 3 of 3 positions shown · non-contrast
Comparison: 01/29/2015

CLINICAL DATA: Fall, injury to left shoulder.  Recheck.

EXAM:
LEFT SHOULDER - 2+ VIEW

[shoulder grashey]
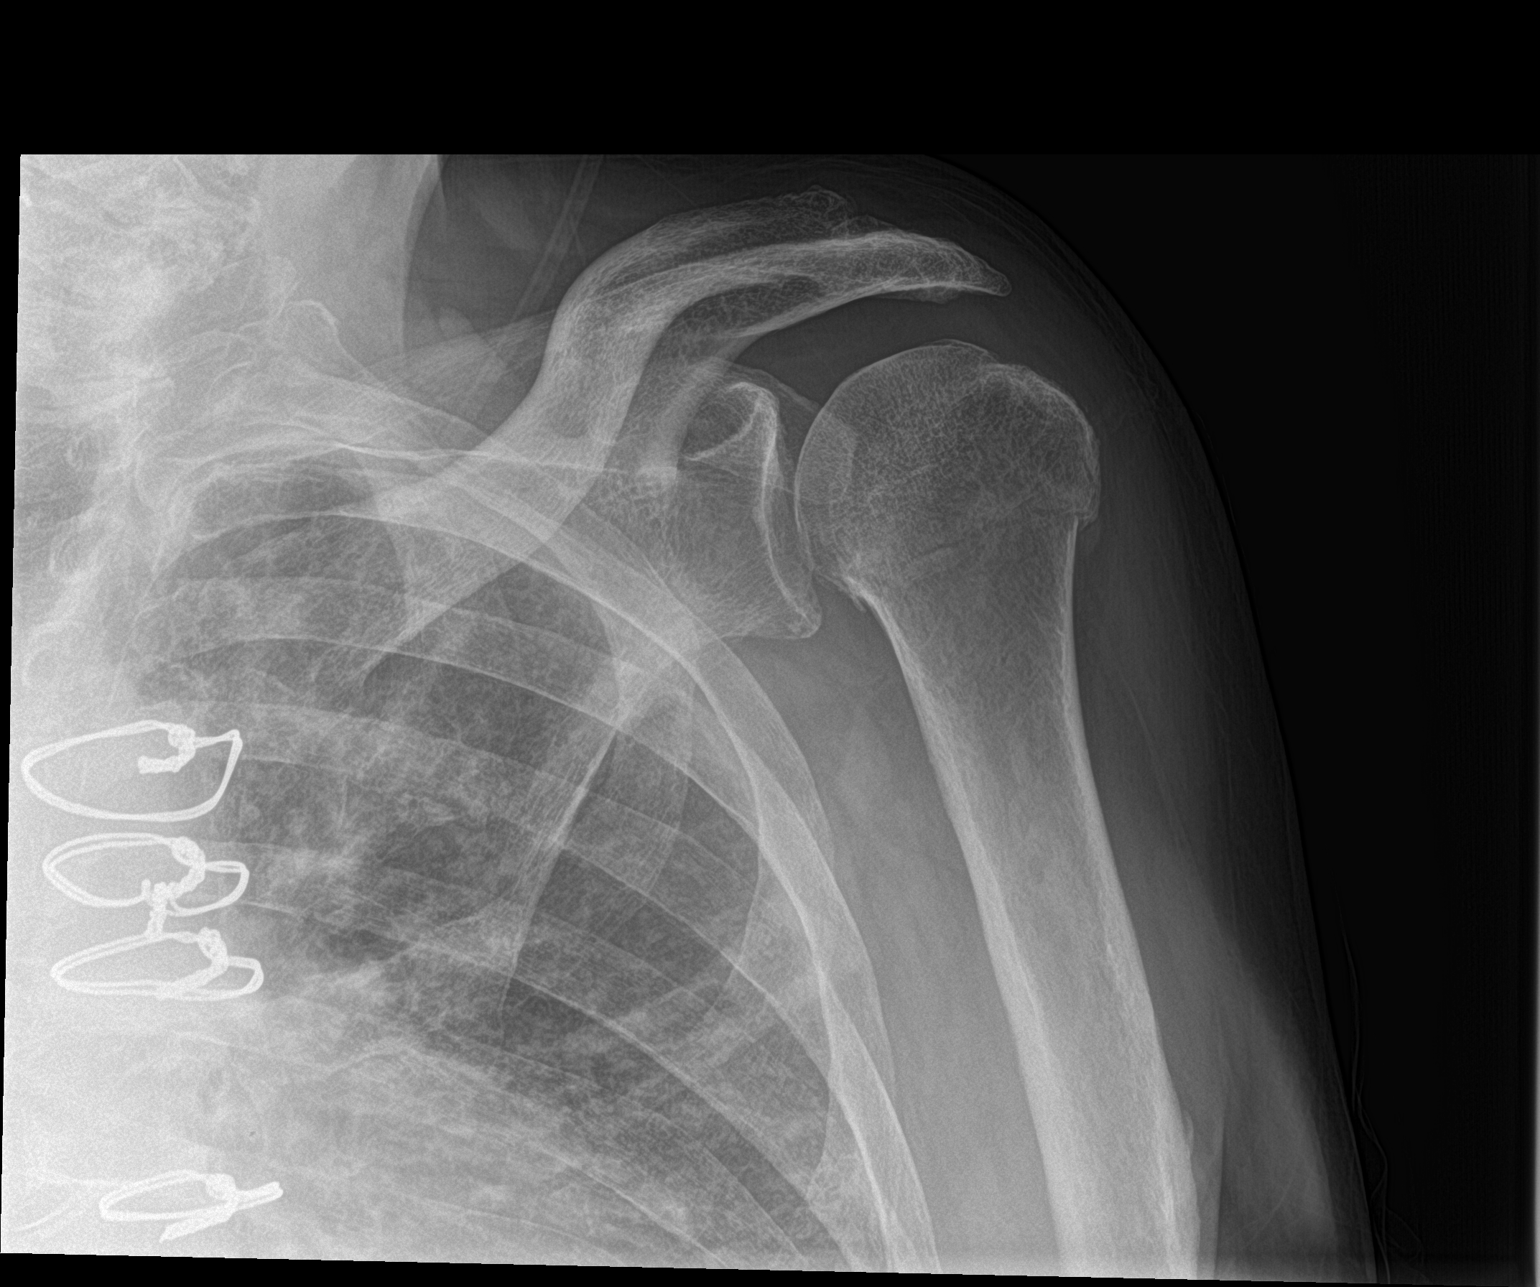

[shoulder y view]
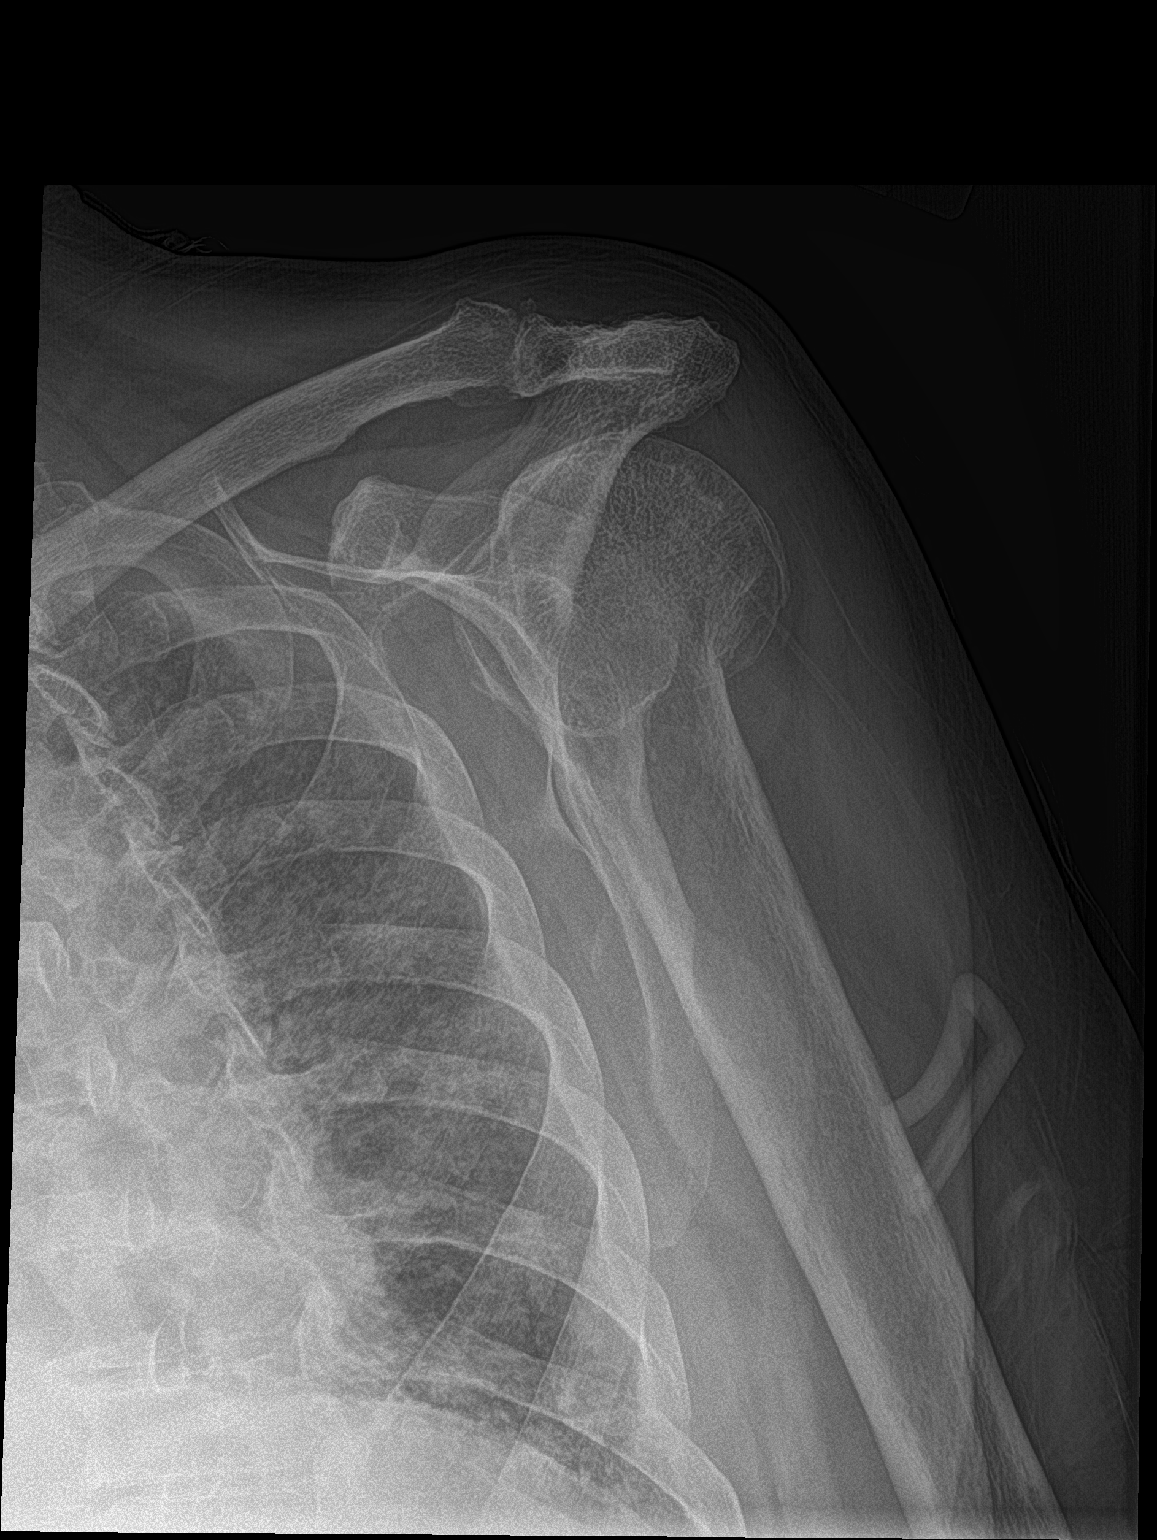

[shoulder ap neutral]
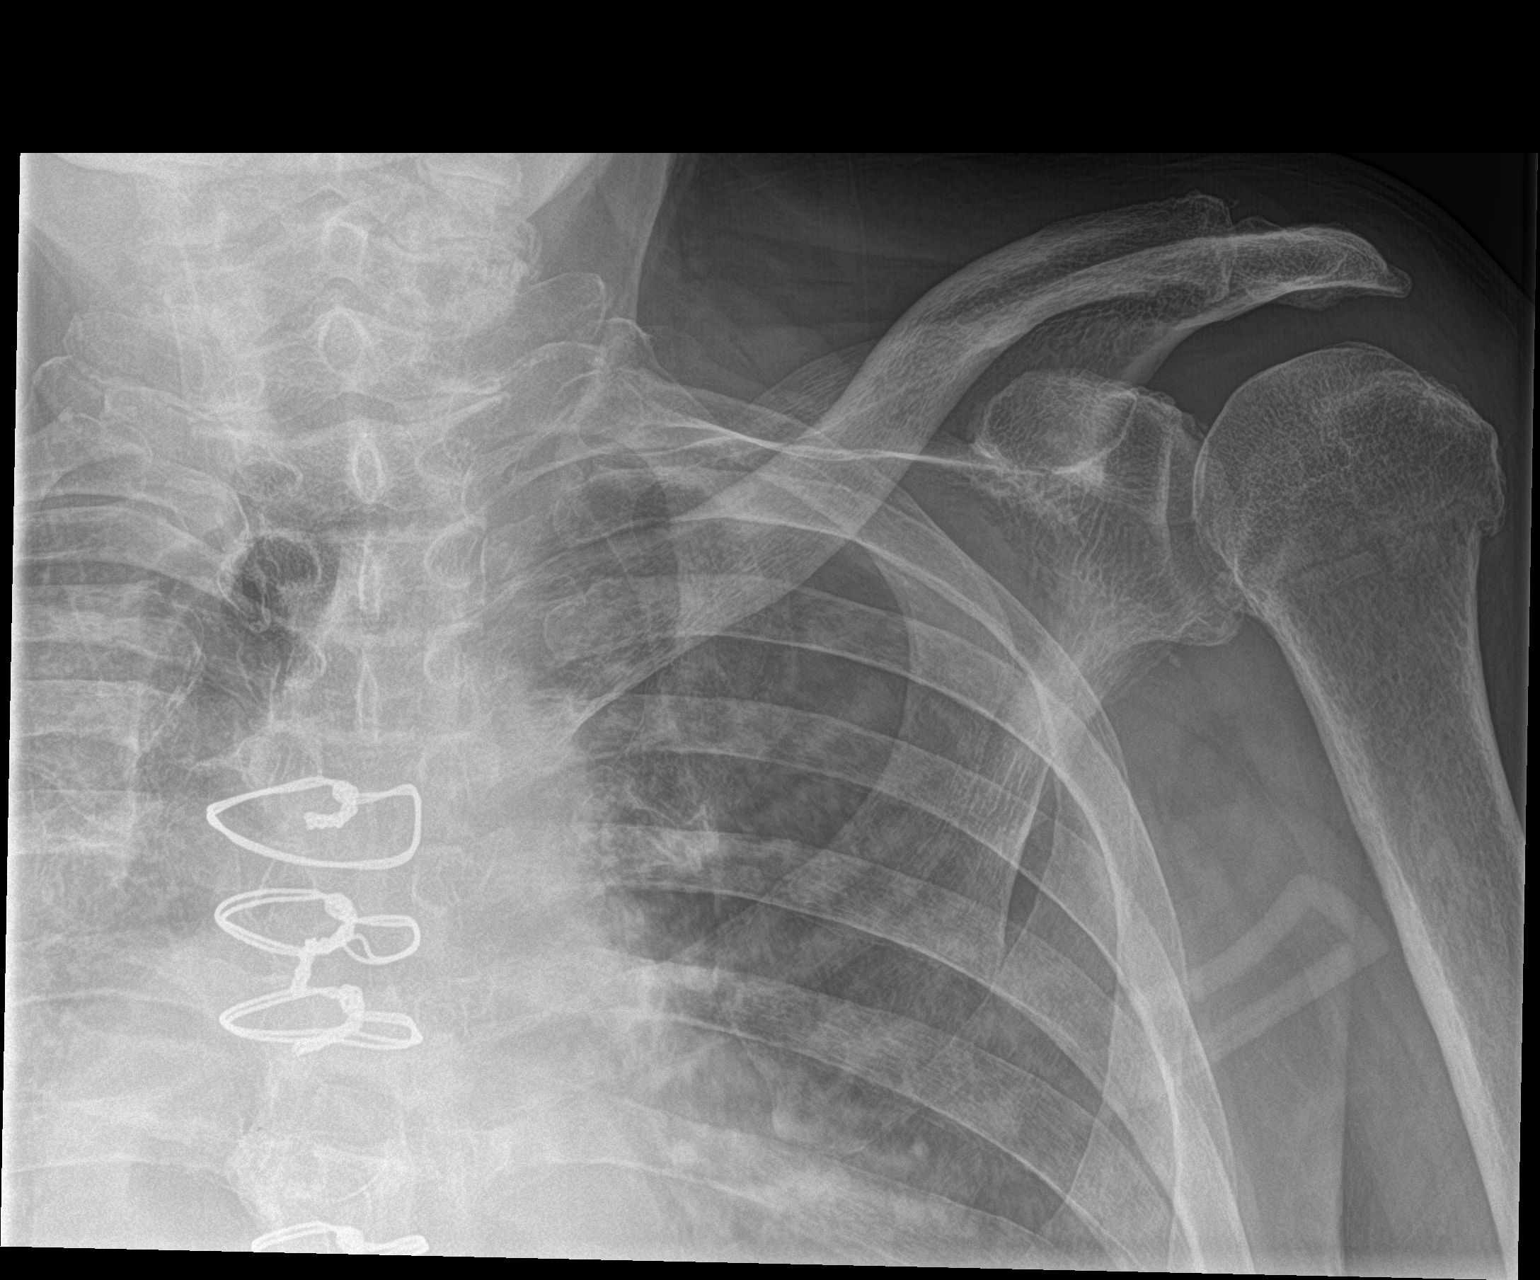

[3 of 3 positions shown; findings below may reference images not displayed]

FINDINGS: Fracture line again remains evident across the left humeral neck. No
change since prior study. No subluxation or dislocation. No current
evidence of healing.
IMPRESSION: Stable appearance of the left humeral neck fracture without visible
evidence for healing.

## 2018-11-01 IMAGING — DX DG CHEST 2V
2 series · 2 of 2 positions shown · non-contrast
Comparison: 09/26/2014, 06/17/2015

CLINICAL DATA: Idiopathic pulmonary fibrosis.  Acute bronchitis

EXAM:
CHEST  2 VIEW

[chest pa]
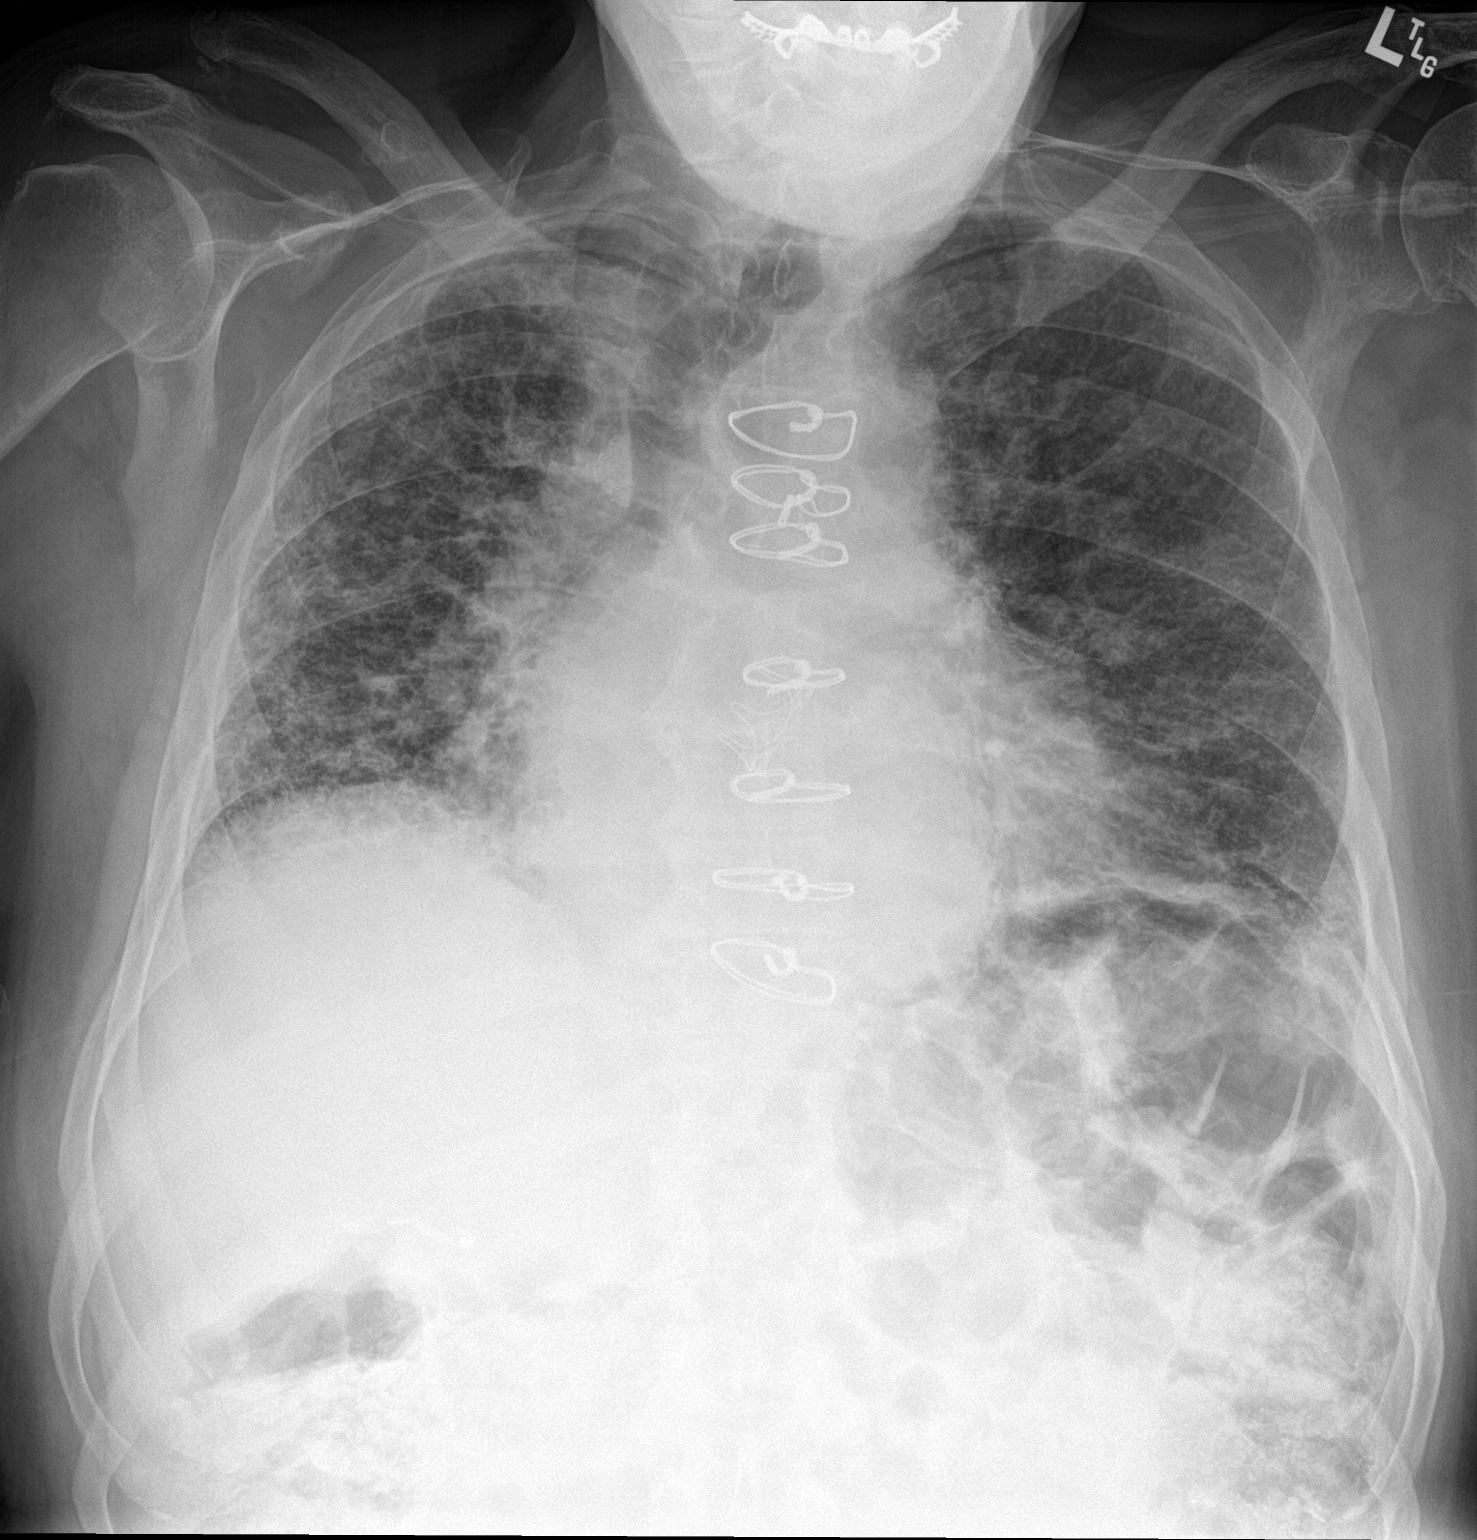

[chest lat]
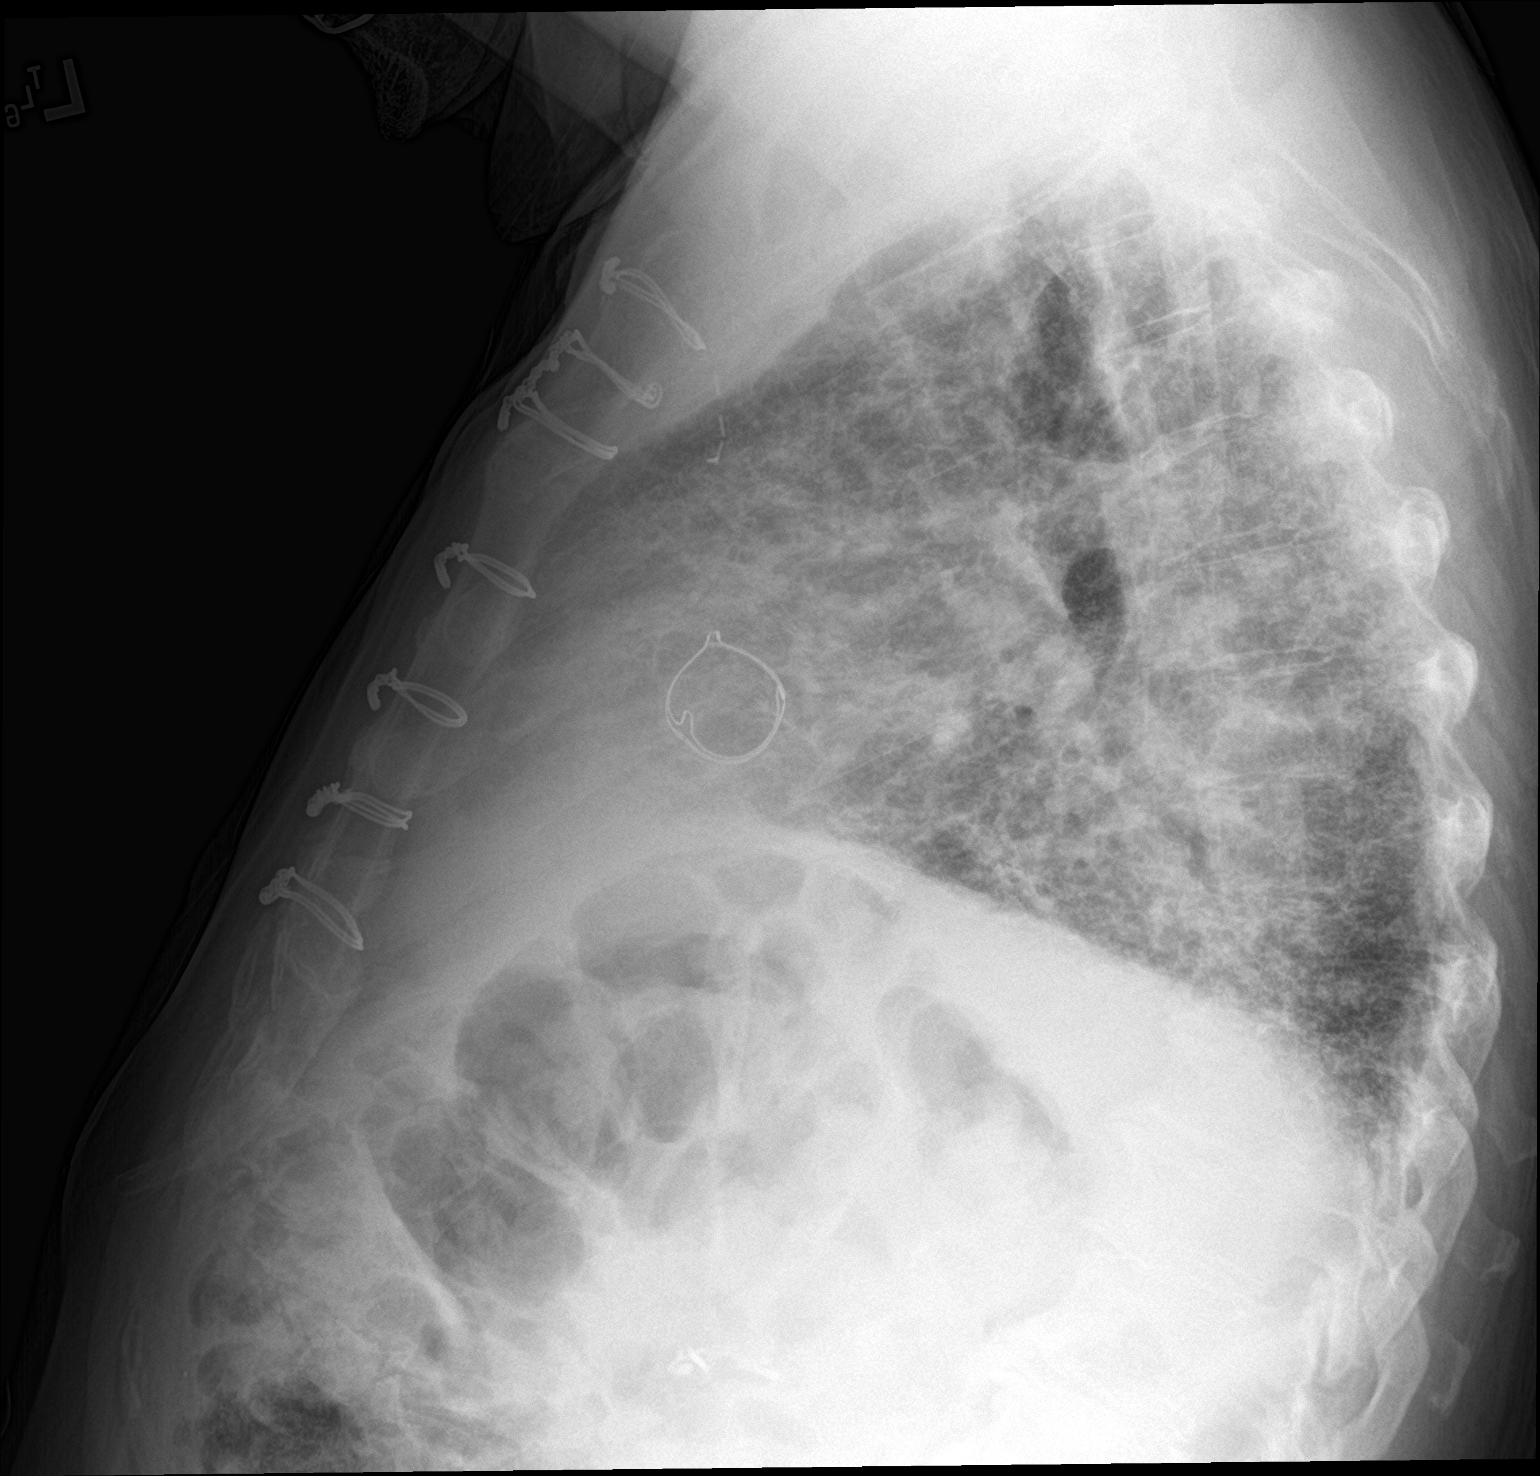

[2 of 2 positions shown; findings below may reference images not displayed]

FINDINGS: Markedly increased interstitial lung markings bilaterally are stable
and consistent with pulmonary fibrosis. Markings are more prominent
on the right than the left. No superimposed infiltrate or effusion.
Aortic valve replacement. Negative for heart failure or edema.
IMPRESSION: Severe pulmonary fibrosis without interval change from prior
studies.
